# Patient Record
Sex: Female | Born: 1935 | Hispanic: No | Smoking: Never smoker
Health system: Southern US, Community
[De-identification: ages and names within clinical notes are randomized; demographics above are authoritative.]

## PROBLEM LIST (undated history)

## (undated) DIAGNOSIS — E538 Deficiency of other specified B group vitamins: Secondary | ICD-10-CM

## (undated) DIAGNOSIS — T7840XA Allergy, unspecified, initial encounter: Secondary | ICD-10-CM

## (undated) DIAGNOSIS — M1990 Unspecified osteoarthritis, unspecified site: Secondary | ICD-10-CM

## (undated) DIAGNOSIS — I509 Heart failure, unspecified: Secondary | ICD-10-CM

## (undated) DIAGNOSIS — D649 Anemia, unspecified: Secondary | ICD-10-CM

## (undated) DIAGNOSIS — E785 Hyperlipidemia, unspecified: Secondary | ICD-10-CM

## (undated) DIAGNOSIS — N183 Chronic kidney disease, stage 3 unspecified: Secondary | ICD-10-CM

## (undated) DIAGNOSIS — K219 Gastro-esophageal reflux disease without esophagitis: Secondary | ICD-10-CM

## (undated) DIAGNOSIS — H269 Unspecified cataract: Secondary | ICD-10-CM

## (undated) DIAGNOSIS — C4491 Basal cell carcinoma of skin, unspecified: Secondary | ICD-10-CM

## (undated) DIAGNOSIS — C801 Malignant (primary) neoplasm, unspecified: Secondary | ICD-10-CM

## (undated) DIAGNOSIS — N189 Chronic kidney disease, unspecified: Secondary | ICD-10-CM

## (undated) DIAGNOSIS — B019 Varicella without complication: Secondary | ICD-10-CM

## (undated) DIAGNOSIS — I10 Essential (primary) hypertension: Secondary | ICD-10-CM

## (undated) DIAGNOSIS — M810 Age-related osteoporosis without current pathological fracture: Secondary | ICD-10-CM

## (undated) DIAGNOSIS — K5790 Diverticulosis of intestine, part unspecified, without perforation or abscess without bleeding: Secondary | ICD-10-CM

## (undated) DIAGNOSIS — E039 Hypothyroidism, unspecified: Secondary | ICD-10-CM

## (undated) HISTORY — DX: Hyperlipidemia, unspecified: E78.5

## (undated) HISTORY — DX: Varicella without complication: B01.9

## (undated) HISTORY — PX: NEUROMA SURGERY: SHX722

## (undated) HISTORY — DX: Gastro-esophageal reflux disease without esophagitis: K21.9

## (undated) HISTORY — DX: Basal cell carcinoma of skin, unspecified: C44.91

## (undated) HISTORY — PX: CATARACT EXTRACTION: SUR2

## (undated) HISTORY — DX: Age-related osteoporosis without current pathological fracture: M81.0

## (undated) HISTORY — DX: Deficiency of other specified B group vitamins: E53.8

## (undated) HISTORY — PX: EYE SURGERY: SHX253

## (undated) HISTORY — PX: DILATION AND CURETTAGE OF UTERUS: SHX78

## (undated) HISTORY — PX: CARDIAC CATHETERIZATION: SHX172

## (undated) HISTORY — DX: Essential (primary) hypertension: I10

## (undated) HISTORY — DX: Unspecified cataract: H26.9

## (undated) HISTORY — DX: Heart failure, unspecified: I50.9

## (undated) HISTORY — PX: JOINT REPLACEMENT: SHX530

## (undated) HISTORY — PX: OTHER SURGICAL HISTORY: SHX169

## (undated) HISTORY — DX: Allergy, unspecified, initial encounter: T78.40XA

## (undated) HISTORY — PX: BUNIONECTOMY: SHX129

---

## 2004-11-20 ENCOUNTER — Ambulatory Visit: Payer: Self-pay | Admitting: Unknown Physician Specialty

## 2005-12-02 ENCOUNTER — Ambulatory Visit: Payer: Self-pay | Admitting: Unknown Physician Specialty

## 2006-09-21 ENCOUNTER — Ambulatory Visit: Payer: Self-pay | Admitting: Unknown Physician Specialty

## 2006-12-06 ENCOUNTER — Ambulatory Visit: Payer: Self-pay | Admitting: Unknown Physician Specialty

## 2007-12-15 ENCOUNTER — Ambulatory Visit: Payer: Self-pay | Admitting: Unknown Physician Specialty

## 2008-12-17 ENCOUNTER — Ambulatory Visit: Payer: Self-pay | Admitting: Unknown Physician Specialty

## 2009-12-23 ENCOUNTER — Ambulatory Visit: Payer: Self-pay | Admitting: Unknown Physician Specialty

## 2010-01-29 ENCOUNTER — Ambulatory Visit: Payer: Self-pay | Admitting: Orthopedic Surgery

## 2010-01-29 ENCOUNTER — Ambulatory Visit: Payer: Self-pay | Admitting: Cardiovascular Disease

## 2010-02-05 ENCOUNTER — Ambulatory Visit: Payer: Self-pay | Admitting: Orthopedic Surgery

## 2010-12-25 ENCOUNTER — Ambulatory Visit: Payer: Self-pay | Admitting: Unknown Physician Specialty

## 2012-01-18 ENCOUNTER — Ambulatory Visit: Payer: Self-pay | Admitting: Internal Medicine

## 2013-02-01 ENCOUNTER — Ambulatory Visit: Payer: Self-pay

## 2013-02-28 ENCOUNTER — Ambulatory Visit: Payer: Self-pay | Admitting: Orthopedic Surgery

## 2013-03-21 ENCOUNTER — Ambulatory Visit: Payer: Self-pay | Admitting: Orthopedic Surgery

## 2013-04-09 ENCOUNTER — Ambulatory Visit: Payer: Self-pay | Admitting: Orthopedic Surgery

## 2013-04-09 LAB — BASIC METABOLIC PANEL
Chloride: 103 mmol/L (ref 98–107)
Creatinine: 1.19 mg/dL (ref 0.60–1.30)
EGFR (African American): 49 — ABNORMAL LOW
Glucose: 92 mg/dL (ref 65–99)
Potassium: 4.1 mmol/L (ref 3.5–5.1)
Sodium: 137 mmol/L (ref 136–145)

## 2013-04-09 LAB — CBC
HCT: 39 % (ref 35.0–47.0)
HGB: 13.1 g/dL (ref 12.0–16.0)
MCH: 28.5 pg (ref 26.0–34.0)
RBC: 4.58 10*6/uL (ref 3.80–5.20)
WBC: 13 10*3/uL — ABNORMAL HIGH (ref 3.6–11.0)

## 2013-04-09 LAB — MRSA PCR SCREENING

## 2013-04-13 ENCOUNTER — Telehealth: Payer: Self-pay | Admitting: *Deleted

## 2013-04-13 NOTE — Telephone Encounter (Signed)
Refill Request  Metoprolol 50 mg  #90  Take 1 tablet daily   [facility]

## 2013-04-13 NOTE — Telephone Encounter (Signed)
Okay to fill? Pt has not been seen at this office

## 2013-04-13 NOTE — Telephone Encounter (Signed)
I have not seen this pt (ever) and she does not have an appt scheduled with me.  Need to send back to pharmacy - for them to send to her regular pcp

## 2013-04-24 ENCOUNTER — Inpatient Hospital Stay: Payer: Self-pay | Admitting: Orthopedic Surgery

## 2013-04-25 LAB — BASIC METABOLIC PANEL
Anion Gap: 5 — ABNORMAL LOW (ref 7–16)
BUN: 21 mg/dL — ABNORMAL HIGH (ref 7–18)
Co2: 27 mmol/L (ref 21–32)
Creatinine: 1.24 mg/dL (ref 0.60–1.30)
EGFR (Non-African Amer.): 40 — ABNORMAL LOW
Glucose: 87 mg/dL (ref 65–99)
Osmolality: 263 (ref 275–301)
Potassium: 3.9 mmol/L (ref 3.5–5.1)
Sodium: 130 mmol/L — ABNORMAL LOW (ref 136–145)

## 2013-04-25 LAB — HEMOGLOBIN: HGB: 11 g/dL — ABNORMAL LOW (ref 12.0–16.0)

## 2013-04-26 LAB — BASIC METABOLIC PANEL
Anion Gap: 5 — ABNORMAL LOW (ref 7–16)
Calcium, Total: 9.1 mg/dL (ref 8.5–10.1)
Chloride: 99 mmol/L (ref 98–107)
Co2: 29 mmol/L (ref 21–32)
Creatinine: 1.3 mg/dL (ref 0.60–1.30)
EGFR (African American): 44 — ABNORMAL LOW
EGFR (Non-African Amer.): 38 — ABNORMAL LOW
Sodium: 133 mmol/L — ABNORMAL LOW (ref 136–145)

## 2014-05-30 ENCOUNTER — Encounter: Payer: Self-pay | Admitting: Sports Medicine

## 2014-05-30 ENCOUNTER — Ambulatory Visit (INDEPENDENT_AMBULATORY_CARE_PROVIDER_SITE_OTHER): Payer: Medicare (Managed Care) | Admitting: Sports Medicine

## 2014-05-30 VITALS — BP 129/82 | HR 71 | Ht 60.0 in | Wt 189.0 lb

## 2014-05-30 DIAGNOSIS — M7120 Synovial cyst of popliteal space [Baker], unspecified knee: Secondary | ICD-10-CM

## 2014-05-30 DIAGNOSIS — IMO0002 Reserved for concepts with insufficient information to code with codable children: Secondary | ICD-10-CM

## 2014-05-30 DIAGNOSIS — M1712 Unilateral primary osteoarthritis, left knee: Secondary | ICD-10-CM

## 2014-05-30 DIAGNOSIS — M1710 Unilateral primary osteoarthritis, unspecified knee: Secondary | ICD-10-CM

## 2014-05-30 DIAGNOSIS — M7122 Synovial cyst of popliteal space [Baker], left knee: Secondary | ICD-10-CM

## 2014-05-30 MED ORDER — METHYLPREDNISOLONE ACETATE 40 MG/ML IJ SUSP
40.0000 mg | Freq: Once | INTRAMUSCULAR | Status: AC
  Administered 2014-05-30: 40 mg via INTRA_ARTICULAR

## 2014-05-30 NOTE — Patient Instructions (Signed)
It was nice to meet you today.  Please keep your knee wrapped with an ace wrap as we discussed.

## 2014-05-30 NOTE — Progress Notes (Signed)
  Olivia Lewis - 80 y.o. female MRN [id number]  Date of birth: 1929-07-25  SUBJECTIVE:  Including CC & ROS.  Baker's Cyst: Patient is followed by Dr. Rudene Christians and has been referred here for ultrasound guided aspiration of left-sided Baker's cyst.  She's had chronic bilateral knee problems for quite some time and underwent a right total knee replacement last year.  Since then she has noted her left knee has been bothering her more.  She has previously undergone injections with only mild to moderate  improvement  In a social he developed a Child psychotherapist cyst that seems to be causing the majority of her symptoms.    HISTORY: Past Medical, Surgical, Social, and Family History Reviewed & Updated per EMR. Pertinent Historical Findings include: History of heart disease and hypertension.  No diabetes.    right total knee replacement less than one year ago Nonsmoker  PHYSICAL EXAM:  VS: BP:129/82 mmHg  HR:71bpm  TEMP: ( )  RESP:   HT:5' (152.4 cm)   WT:189 lb (85.73 kg)  BMI:37 PHYSICAL EXAM: GENERAL:   elderly obese Caucasian  female. In no discomfort; no respiratory distress  PSYCH:  alert and appropriate, good insight  Left Knee Exam:        Gen/Palp:  Hypertrophic changes of the knee.  She has tenderness to palpation diffusely most notably over the bilateral joint lines and over her Baker's cyst that is easily palpable.                  ROM:  She has an approximately 5 loss of terminal extension                  Tests:  Positive patellar grind, Collateral ligaments are intact.   ASSESSMENT & PLAN: See problem based charting & AVS for pt instructions.  PROCEDURE NOTE : Left Knee US Guided Injection The risks, benefits and expected outcomes of the injection were reviewed and she wishes to undergo the above named procedure.  Written consent was obtained. After an appropriate time out was taken, the ultrasound was used to identify structures of the posterior popliteal region.  Large Baker cyst was  appreciated with no adjacent vascularity in the medial popliteal fossa.  The area was cleaned with alcohol and sterile ultrasound gel was used.  A skin wheal was obtained using 1 cc of 1% Xylocaine.  An 18-gauge syringe was inserted under all shown guidance and approximately 5 cc of straw-colored synovial fluid was withdrawn.  The Baker's cyst was completely collapsed at this point.  Syringes were exchanged and 1 cc of 40 mg Depo-Medrol was injected.  Ultrasound confirmed injection was placed into the Baker's cyst.  A Band-Aid was applied and the knee was wrapped with an Ace bandage. This procedure was well tolerated and there were no complications.

## 2014-05-30 NOTE — Assessment & Plan Note (Signed)
Chronic condition  - reactive Baker's Cyst to presumptive OA.  We discussed this is not a curative procedure and that we are hopeful it will improve her symptoms; she voices understanding of this. 1. US Guided Needle Aspiration and Injection provided today.   2. Continue compression >    Will return to Dr. Rudene Christians as scheduled

## 2015-02-25 DIAGNOSIS — E039 Hypothyroidism, unspecified: Secondary | ICD-10-CM | POA: Insufficient documentation

## 2015-02-25 DIAGNOSIS — N183 Chronic kidney disease, stage 3 unspecified: Secondary | ICD-10-CM | POA: Insufficient documentation

## 2015-02-25 DIAGNOSIS — N1831 Chronic kidney disease, stage 3a: Secondary | ICD-10-CM | POA: Insufficient documentation

## 2015-03-14 NOTE — Op Note (Signed)
PATIENT NAME:  Olivia Lewis, Olivia Lewis MR#:  [id number] DATE OF BIRTH:  1929-04-26  DATE OF PROCEDURE:  04/24/2013  PREOPERATIVE DIAGNOSIS: Right knee osteoarthritis.   POSTOPERATIVE DIAGNOSIS: Right knee osteoarthritis.   PROCEDURE: Right total knee replacement.   SURGEON: Laurene Footman, MD   ASSISTANT: Rachelle Hora, PA-C   ANESTHESIA: Spinal.   DESCRIPTION OF PROCEDURE: The patient was brought to the Operating Room, and after adequate anesthesia was obtained the right leg was prepped and draped in the usual sterile fashion. After prepping and draping in the usual sterile manner, appropriate patient identification and timeout procedures were completed. The leg was exsanguinated with an Esmarch and the tourniquet raised to 300 mmHg.   A midline skin incision was made with the knee in flexion followed by a medial parapatellar arthrotomy. Inspection of the knee revealed moderately advanced patellofemoral degenerative change, osteophytes medially with early degenerative change medially and a significant defect in the lateral femoral condyle as well as extensive erosion of the lateral tibial condyle. The ACL and fat pad were excised and proximal tibia exposed to allow for application of the Medacta cutting block. This was applied and the proximal tibia cut was carried out with removal of the proximal tibia cut along with the meniscus.  The knee had a valgus deformity, and the PCL was also excised to help correct the valgus deformity. The femur was then debrided to allow for application of the femoral cutting guide and the distal cuts made. The #4 cutting block was applied, and there was no notching with the anterior cut. Anterior, posterior and chamfer cuts were carried out. At this point, some residual bone off the tibia was removed and the 3 tibia trial was placed, pinned into place, proximal drill hole made and then the notch cut to hold it in place while trials were placed. The 4 femur was placed as well.  It was determined that a PS implant would be required, and with the trials a 20 mm trial gave excellent stability with full extension and flexion with good patellofemoral tracking. The distal femoral drill holes were made along with the notch cut for the trochlea at this time as well as the drill for removal of additional bone for the PS implant. The patella was then cut with the aid of a guide and drill guide applied. Three drill holes were made for the patellar implant, and a size 2 fit well, again with good tracking with no touch technique. At this point, all trial components were removed.  Exparel for local anesthetic was then infiltrated over multiple areas around the knee diluted with saline along with Marcaine and morphine in a similar fashion to aid in postoperative analgesia. The tourniquet was let down, and there was no excessive bleeding. The tourniquet was then again raised and the knee thoroughly irrigated and dried. The tibial component was cemented in place first, followed by the tibial insert, followed by the femoral component. The knee was held in extension as the patellar button was cemented and clamped in place. After the cement had set, excess cement was removed with use of a small osteotome and the knee again thoroughly irrigated. The arthrotomy was repaired with 0 Ethibond to help repair the medial retinaculum followed by a heavy quill suture, 2-0 quill subcutaneously, and skin staples. A wound VAC with Mepitel was then applied because of thickness of subcutaneous fat to try to prevent subcutaneous hematoma. A Polar Care device was then applied, and the patient was sent to  the recovery room in stable condition. The tourniquet was let down at the time of wound closure.   ESTIMATED BLOOD LOSS: 100 mL.  TOURNIQUET TIME:  A total of 80 minutes.  IMPLANTS: GMK primary total knee from [place], size 4N right PS femoral component, a size 3 right fixed standard tibial tray with a size 320 mm PS  tibial insert and a size 2 resurfacing patella. All components cemented.   CONDITION TO RECOVERY ROOM: Stable.   SPECIMEN: Cut ends of bone.   COMPLICATIONS: None.   ____________________________ Laurene Footman, MD mjm:cb D: 04/24/2013 19:28:54 ET T: 04/24/2013 21:16:24 ET JOB#: [id number]  cc: Laurene Footman, MD, <Dictator> Laurene Footman MD ELECTRONICALLY SIGNED 04/25/2013 12:51

## 2015-03-14 NOTE — Discharge Summary (Signed)
PATIENT NAME:  Olivia Lewis, Olivia Lewis MR#:  [id number] DATE OF BIRTH:  [birth year]  DATE OF ADMISSION:  04/24/2013 DATE OF DISCHARGE:  04/28/2013  ADMITTING DIAGNOSIS: Right knee osteoarthritis.   DISCHARGE DIAGNOSIS: Right knee osteoarthritis.   PROCEDURE: Right total knee replacement.    SURGEON: Laurene Footman, MD   ASSISTANT: Ronney Asters, PA-C   ANESTHESIA: Spinal.   ESTIMATED BLOOD LOSS: 100 mL.   TOURNIQUET TIME: A total of 80 minutes.   IMPLANTS: A GMK primary total knee from [place], size 4N right PS femoral component, a size 3 fixed standard tibial tray with a size 320 mm PS tibial insert and a size 2 resurfacing patella. All components cemented.   CONDITION: To recovery room stable.    HISTORY: The patient is an 81 year old female with a history of right knee osteoarthritis. She had exhausted nonoperative treatment and comes in after having my knee CT for preoperative planning of right total knee replacement. She reports her knee is hurting more and more and is starting to affect her left knee. She is having difficulty walking and even just doing normal activities of daily living around the house.   PHYSICAL EXAMINATION:  LUNGS: Clear.  HEART: Regular rate and rhythm.  NECK: Remarkable for limitation of flexion and extension of cervical spine.  EXTREMITIES: On exam, she has 5 to 10 degrees range of motion today with moderate swelling. She has slight valgus deformity which is passively correctable. Patellofemoral crepitation and again effusion. Distally, she has trace dorsalis pedis and posterior tibialis pulse. Flexion and extension of the toes is normal.   HOSPITAL COURSE: The patient was admitted to the hospital on 04/24/2013. She had surgery that same day and was brought to the orthopedic floor from the PACU unit in stable condition. On postoperative day 1, the patient was found to have a sodium level of 130 and hemoglobin slightly decreased at 11.0. The patient was  continued with normal saline, and on postoperative day 2, hemoglobin was stabilized, and sodium was 134. The patient was doing well. She was making good progress with therapy. By postoperative day 3, the patient had had significant improvement of her pain and continued to progress with therapy. On postoperative day 4, the patient had met goals for therapy to go home, and she had had a bowel movement and was ready for discharge to home with home health PT.   CONDITION AT DISCHARGE: Stable.   DISCHARGE INSTRUCTIONS:  1. The patient may gradually increase weight-bearing on the affected extremity.  2. She is to elevate the affected foot or leg on 1 or 2 pillows with the foot higher than the knee. 3. Thigh-high TED hose on both legs and remove at bedtime, replace when arising the next morning.  4. Elevate heels off the bed.  5. Use incentive spirometry every hour while awake and encourage cough and deep breathing. 6. She may resume a regular diet as tolerated.  7. Continue using Polar Care unit, maintaining a temperature between 40 and 50 degrees.  8. Do not get the dressing or bandage wet or dirty. Call [facility] if the dressing gets water under it. Leave the dressing on.  9. Call [facility] if any of the following occur: Bright red bleeding from the incision wound, fever of 101.5 degrees, redness, swelling or drainage at the incision. Call [facility] if you experience any increased leg pain, numbness or weakness in legs or bowel or bladder symptoms.  10. She was referred to home  physical therapy. She needs to call [place] if therapist has not contacted her within 48 hours of her return home.  11. She has followup appointment at [facility] in 2 weeks. She is to call for appointment.   DISCHARGE MEDICATIONS:  1. HCTZ 12.5 mg oral tablet 1 tablet orally once a day in the morning. 2. Metoprolol 50 mg tablet 1 tablet orally once a day in the morning.  3. Lipitor 10 mg tablet 1  tablet orally once a day at bedtime.  4. Aspirin 81 mg 1 tablet orally once a day at bedtime.  5. Glucosamine/chondroitin 400/500 mg 250 mg tablet 1 tablet orally once a day after meal at lunch.  6. Tylenol Extra Strength 500 mg tablet 2 tabs orally a day as needed.  7. Colace 100 mg oral capsule 1 cap orally once a day as needed.  8. Fish oil oral capsule 1 cap orally once a day. 9. Claritin 10 mg oral tablet 1 tablet orally once a day as needed.  10. Calcium carbonate/vitamin D 500 1 tablet orally 2 times a day. 11. Synthroid 50 mcg oral tablet 1 tablet orally once a day.  12. Vitamin B12 one tablet orally once a day.  13. Amlodipine/benazepril 5 mg/40 mg oral capsule 1 tab orally once a day in the morning. 14. Oxycodone 5 mg oral tablet 1 tablet orally every 4 hours as needed for pain. 15. Benazepril 40 mg oral tablet 1 tablet orally once a day.  16. Magnesium hydroxide 8% oral suspension 30 mL orally 2 times a day as needed for constipation. 17. Xarelto 1 tablet orally once a day in the morning for 8 days.   ____________________________ T. Rachelle Hora, PA-C tcg:OSi D: 04/27/2013 07:27:04 ET T: 04/27/2013 07:56:40 ET JOB#: [id number]  cc: T. Rachelle Hora, PA-C, <Dictator> Duanne Guess [place] ELECTRONICALLY SIGNED 04/29/2013 2:35

## 2015-03-26 ENCOUNTER — Ambulatory Visit: Payer: Medicare (Managed Care)

## 2015-03-26 ENCOUNTER — Other Ambulatory Visit: Payer: Self-pay | Admitting: Orthopedic Surgery

## 2015-03-26 DIAGNOSIS — M1712 Unilateral primary osteoarthritis, left knee: Secondary | ICD-10-CM

## 2015-03-27 ENCOUNTER — Ambulatory Visit
Admission: RE | Admit: 2015-03-27 | Discharge: 2015-03-27 | Disposition: A | Discharge status: Home / Self Care | Payer: Medicare Other | Source: Ambulatory Visit | Attending: Orthopedic Surgery | Admitting: Orthopedic Surgery

## 2015-03-27 DIAGNOSIS — M1712 Unilateral primary osteoarthritis, left knee: Secondary | ICD-10-CM | POA: Diagnosis not present

## 2015-03-27 DIAGNOSIS — I708 Atherosclerosis of other arteries: Secondary | ICD-10-CM | POA: Diagnosis not present

## 2015-04-24 ENCOUNTER — Encounter
Admission: RE | Admit: 2015-04-24 | Discharge: 2015-04-24 | Disposition: A | Discharge status: Home / Self Care | Payer: Medicare Other | Source: Ambulatory Visit | Attending: Orthopedic Surgery | Admitting: Orthopedic Surgery

## 2015-04-24 DIAGNOSIS — M179 Osteoarthritis of knee, unspecified: Secondary | ICD-10-CM | POA: Diagnosis not present

## 2015-04-24 HISTORY — DX: Hypothyroidism, unspecified: E03.9

## 2015-04-24 HISTORY — DX: Diverticulosis of intestine, part unspecified, without perforation or abscess without bleeding: K57.90

## 2015-04-24 HISTORY — DX: Unspecified osteoarthritis, unspecified site: M19.90

## 2015-04-24 HISTORY — DX: Anemia, unspecified: D64.9

## 2015-04-24 HISTORY — DX: Chronic kidney disease, unspecified: N18.9

## 2015-04-24 HISTORY — DX: Malignant (primary) neoplasm, unspecified: C80.1

## 2015-04-24 LAB — URINALYSIS COMPLETE WITH MICROSCOPIC (ARMC ONLY)
BILIRUBIN URINE: NEGATIVE
Bacteria, UA: NONE SEEN
Glucose, UA: NEGATIVE mg/dL
HGB URINE DIPSTICK: NEGATIVE
KETONES UR: NEGATIVE mg/dL
NITRITE: NEGATIVE
PROTEIN: NEGATIVE mg/dL
Specific Gravity, Urine: 1.012 (ref 1.005–1.030)
pH: 6 (ref 5.0–8.0)

## 2015-04-24 LAB — BASIC METABOLIC PANEL
Anion gap: 10 (ref 5–15)
BUN: 31 mg/dL — ABNORMAL HIGH (ref 6–20)
CHLORIDE: 100 mmol/L — AB (ref 101–111)
CO2: 29 mmol/L (ref 22–32)
Calcium: 10.1 mg/dL (ref 8.9–10.3)
Creatinine, Ser: 1.54 mg/dL — ABNORMAL HIGH (ref 0.44–1.00)
GFR calc Af Amer: 35 mL/min — ABNORMAL LOW (ref >60)
GFR calc non Af Amer: 30 mL/min — ABNORMAL LOW (ref >60)
Glucose, Bld: 115 mg/dL — ABNORMAL HIGH (ref 65–99)
Potassium: 4.1 mmol/L (ref 3.5–5.1)
Sodium: 139 mmol/L (ref 135–145)

## 2015-04-24 LAB — SEDIMENTATION RATE: Sed Rate: 6 mm/hr (ref 0–30)

## 2015-04-24 LAB — CBC
HCT: 41.5 % (ref 35.0–47.0)
Hemoglobin: 14 g/dL (ref 12.0–16.0)
MCH: 29 pg (ref 26.0–34.0)
MCHC: 33.7 g/dL (ref 32.0–36.0)
MCV: 86.2 fL (ref 80.0–100.0)
Platelets: 282 10*3/uL (ref 150–440)
RBC: 4.81 MIL/uL (ref 3.80–5.20)
RDW: 13.8 % (ref 11.5–14.5)
WBC: 11.9 10*3/uL — AB (ref 3.6–11.0)

## 2015-04-24 LAB — SURGICAL PCR SCREEN
MRSA, PCR: NEGATIVE
STAPHYLOCOCCUS AUREUS: POSITIVE — AB

## 2015-04-24 LAB — PROTIME-INR
INR: 0.92
Prothrombin Time: 12.6 seconds (ref 11.4–15.0)

## 2015-04-24 LAB — APTT: aPTT: 30 seconds (ref 24–36)

## 2015-04-24 LAB — TYPE AND SCREEN
ABO/RH(D): A NEG
Antibody Screen: NEGATIVE

## 2015-04-24 LAB — ABO/RH: ABO/RH(D): A NEG

## 2015-04-24 NOTE — Patient Instructions (Signed)
  Your procedure is scheduled on: May 06, 2015 (Tuesday) Report to Same  Day Surgery. To find out your arrival time please call [phone] between 1PM - 3PM on May 05, 2015 (Monday).  Remember: Instructions that are not followed completely may result in serious medical risk, up to and including death, or upon the discretion of your surgeon and anesthesiologist your surgery may need to be rescheduled.    __x__ 1. Do not eat food or drink liquids after midnight. No gum chewing or hard candies.     ____ 2. No Alcohol for 24 hours before or after surgery.   ____ 3. Bring all medications with you on the day of surgery if instructed.    __x__ 4. Notify your doctor if there is any change in your medical condition     (cold, fever, infections).     Do not wear jewelry, make-up, hairpins, clips or nail polish.  Do not wear lotions, powders, or perfumes. You may wear deodorant.  Do not shave 48 hours prior to surgery. Men may shave face and neck.  Do not bring valuables to the hospital.    [facility] is not responsible for any belongings or valuables.               Contacts, dentures or bridgework may not be worn into surgery.  Leave your suitcase in the car. After surgery it may be brought to your room.  For patients admitted to the hospital, discharge time is determined by your                treatment team.   Patients discharged the day of surgery will not be allowed to drive home.   Please read over the following fact sheets that you were given:   MRSA Information and Surgical Site Infection Prevention   ____ Take these medicines the morning of surgery with A SIP OF WATER:    1. Metoprolol  2. Amlodipine/Benazepril  3.   4.  5.  6.  ____ Fleet Enema (as directed)   __x__ Use CHG Soap as directed  ____ Use inhalers on the day of surgery  ____ Stop metformin 2 days prior to surgery    ____ Take 1/2 of usual insulin dose the night before surgery and none on the morning of  surgery.   __x__ Stop Coumadin/Plavix/aspirin on one week before surgery  ____ Stop Anti-inflammatories on    x  Stop supplements until after surgery. (Fish Oil, Vitamin B-12, Osteo B- Flex now)   ____ Bring C-Pap to the hospital.

## 2015-04-25 NOTE — OR Nursing (Signed)
[facility] and faxed Dr Theodore Demark office with request for medical clearance.

## 2015-04-25 NOTE — OR Nursing (Signed)
  [facility] called and faxed regarding positive Staphylococcus aureus result.

## 2015-04-29 NOTE — OR Nursing (Signed)
Olivia Lewis ([facility],ortho) notified of positive staph aureus results, and stated Dr. Rudene Christians aware and is not going to treat per [place].

## 2015-05-01 NOTE — OR Nursing (Signed)
Cleared by Dr Ola Spurr low risk 04/30/15

## 2015-05-06 ENCOUNTER — Inpatient Hospital Stay: Payer: Medicare Other

## 2015-05-06 ENCOUNTER — Encounter: Payer: Self-pay | Admitting: *Deleted

## 2015-05-06 ENCOUNTER — Inpatient Hospital Stay
Admission: RE | Admit: 2015-05-06 | Discharge: 2015-05-09 | DRG: 470 | Disposition: A | Discharge status: Home / Self Care | Payer: Medicare Other | Source: Ambulatory Visit | Attending: Orthopedic Surgery | Admitting: Orthopedic Surgery

## 2015-05-06 ENCOUNTER — Encounter
Admission: RE | Disposition: A | Discharge status: Home / Self Care | Payer: Self-pay | Source: Ambulatory Visit | Attending: Orthopedic Surgery

## 2015-05-06 ENCOUNTER — Inpatient Hospital Stay: Payer: Medicare Other | Admitting: Certified Registered"

## 2015-05-06 DIAGNOSIS — F419 Anxiety disorder, unspecified: Secondary | ICD-10-CM | POA: Diagnosis not present

## 2015-05-06 DIAGNOSIS — E039 Hypothyroidism, unspecified: Secondary | ICD-10-CM | POA: Diagnosis present

## 2015-05-06 DIAGNOSIS — I129 Hypertensive chronic kidney disease with stage 1 through stage 4 chronic kidney disease, or unspecified chronic kidney disease: Secondary | ICD-10-CM | POA: Diagnosis present

## 2015-05-06 DIAGNOSIS — Z6838 Body mass index (BMI) 38.0-38.9, adult: Secondary | ICD-10-CM

## 2015-05-06 DIAGNOSIS — E785 Hyperlipidemia, unspecified: Secondary | ICD-10-CM | POA: Diagnosis present

## 2015-05-06 DIAGNOSIS — Z85828 Personal history of other malignant neoplasm of skin: Secondary | ICD-10-CM

## 2015-05-06 DIAGNOSIS — G8918 Other acute postprocedural pain: Secondary | ICD-10-CM

## 2015-05-06 DIAGNOSIS — N189 Chronic kidney disease, unspecified: Secondary | ICD-10-CM | POA: Diagnosis present

## 2015-05-06 DIAGNOSIS — M1712 Unilateral primary osteoarthritis, left knee: Secondary | ICD-10-CM | POA: Diagnosis present

## 2015-05-06 DIAGNOSIS — M1710 Unilateral primary osteoarthritis, unspecified knee: Secondary | ICD-10-CM | POA: Diagnosis present

## 2015-05-06 HISTORY — PX: TOTAL KNEE ARTHROPLASTY: SHX125

## 2015-05-06 LAB — CBC
HEMATOCRIT: 36.5 % (ref 35.0–47.0)
Hemoglobin: 12.1 g/dL (ref 12.0–16.0)
MCH: 28.8 pg (ref 26.0–34.0)
MCHC: 33 g/dL (ref 32.0–36.0)
MCV: 87.2 fL (ref 80.0–100.0)
Platelets: 242 10*3/uL (ref 150–440)
RBC: 4.19 MIL/uL (ref 3.80–5.20)
RDW: 14 % (ref 11.5–14.5)
WBC: 8.8 10*3/uL (ref 3.6–11.0)

## 2015-05-06 LAB — CREATININE, SERUM
Creatinine, Ser: 1.27 mg/dL — ABNORMAL HIGH (ref 0.44–1.00)
GFR calc non Af Amer: 38 mL/min — ABNORMAL LOW (ref >60)
GFR, EST AFRICAN AMERICAN: 44 mL/min — AB (ref >60)

## 2015-05-06 SURGERY — ARTHROPLASTY, KNEE, TOTAL
Anesthesia: Spinal | Site: Knee | Laterality: Left | Wound class: Clean

## 2015-05-06 MED ORDER — NEOMYCIN-POLYMYXIN B GU 40-200000 IR SOLN
Status: DC | PRN
  Administered 2015-05-06: 16 mL

## 2015-05-06 MED ORDER — PROPOFOL INFUSION 10 MG/ML OPTIME
INTRAVENOUS | Status: DC | PRN
  Administered 2015-05-06: 12:00:00 via INTRAVENOUS
  Administered 2015-05-06: 25 ug/kg/min via INTRAVENOUS

## 2015-05-06 MED ORDER — CLINDAMYCIN PHOSPHATE 900 MG/50ML IV SOLN
900.0000 mg | Freq: Once | INTRAVENOUS | Status: AC
  Administered 2015-05-06: 900 mg via INTRAVENOUS

## 2015-05-06 MED ORDER — HYDROCHLOROTHIAZIDE 25 MG PO TABS: 12.5000 mg | ORAL_TABLET | ORAL | Status: DC

## 2015-05-06 MED ORDER — ONDANSETRON HCL 4 MG/2ML IJ SOLN: 4.0000 mg | Freq: Four times a day (QID) | INTRAMUSCULAR | Status: DC | PRN

## 2015-05-06 MED ORDER — B-12 100-5000 MCG SL SUBL: 1.0000 | SUBLINGUAL_TABLET | Freq: Every day | SUBLINGUAL | Status: DC

## 2015-05-06 MED ORDER — LACTATED RINGERS IV SOLN
INTRAVENOUS | Status: DC
  Administered 2015-05-06 (×2): via INTRAVENOUS

## 2015-05-06 MED ORDER — HYDROCHLOROTHIAZIDE 12.5 MG PO CAPS
12.5000 mg | ORAL_CAPSULE | Freq: Every day | ORAL | Status: DC
  Administered 2015-05-07 – 2015-05-09 (×3): 12.5 mg via ORAL
  Filled 2015-05-06 (×3): qty 1

## 2015-05-06 MED ORDER — LEVOTHYROXINE SODIUM 50 MCG PO TABS
50.0000 ug | ORAL_TABLET | Freq: Every day | ORAL | Status: DC
  Administered 2015-05-06 – 2015-05-09 (×4): 50 ug via ORAL
  Filled 2015-05-06 (×6): qty 1

## 2015-05-06 MED ORDER — LIDOCAINE HCL (PF) 1 % IJ SOLN
INTRAMUSCULAR | Status: AC
  Filled 2015-05-06: qty 2

## 2015-05-06 MED ORDER — TRANEXAMIC ACID 1000 MG/10ML IV SOLN
1000.0000 mg | INTRAVENOUS | Status: AC
  Administered 2015-05-06: 1000 mg via INTRAVENOUS
  Filled 2015-05-06: qty 10

## 2015-05-06 MED ORDER — LORATADINE 10 MG PO TABS: 10.0000 mg | ORAL_TABLET | Freq: Every day | ORAL | Status: DC | PRN

## 2015-05-06 MED ORDER — SM FISH OIL 1000 MG PO CAPS: 1.0000 | ORAL_CAPSULE | Freq: Every day | ORAL | Status: DC

## 2015-05-06 MED ORDER — METHOCARBAMOL 500 MG PO TABS: 500.0000 mg | ORAL_TABLET | Freq: Four times a day (QID) | ORAL | Status: DC | PRN

## 2015-05-06 MED ORDER — BUPIVACAINE-EPINEPHRINE (PF) 0.25% -1:200000 IJ SOLN
INTRAMUSCULAR | Status: AC
  Filled 2015-05-06: qty 30

## 2015-05-06 MED ORDER — BUPIVACAINE LIPOSOME 1.3 % IJ SUSP
INTRAMUSCULAR | Status: AC
  Filled 2015-05-06: qty 20

## 2015-05-06 MED ORDER — FENTANYL CITRATE (PF) 100 MCG/2ML IJ SOLN: 25.0000 ug | INTRAMUSCULAR | Status: DC | PRN

## 2015-05-06 MED ORDER — ATORVASTATIN CALCIUM 10 MG PO TABS
10.0000 mg | ORAL_TABLET | ORAL | Status: DC
  Administered 2015-05-06 – 2015-05-08 (×3): 10 mg via ORAL
  Filled 2015-05-06 (×3): qty 1

## 2015-05-06 MED ORDER — SODIUM CHLORIDE 0.9 % IV SOLN
INTRAVENOUS | Status: DC
Start: 2015-05-06 — End: 2015-05-09
  Administered 2015-05-06 – 2015-05-07 (×3): via INTRAVENOUS

## 2015-05-06 MED ORDER — BISACODYL 10 MG RE SUPP
10.0000 mg | Freq: Every day | RECTAL | Status: DC | PRN
  Administered 2015-05-08: 10 mg via RECTAL
  Filled 2015-05-06: qty 1

## 2015-05-06 MED ORDER — AMLODIPINE BESYLATE 5 MG PO TABS
5.0000 mg | ORAL_TABLET | Freq: Every day | ORAL | Status: DC
  Administered 2015-05-07 – 2015-05-09 (×3): 5 mg via ORAL
  Filled 2015-05-06 (×3): qty 1

## 2015-05-06 MED ORDER — ALUM & MAG HYDROXIDE-SIMETH 200-200-20 MG/5ML PO SUSP: 30.0000 mL | ORAL | Status: DC | PRN

## 2015-05-06 MED ORDER — CLINDAMYCIN PHOSPHATE 900 MG/50ML IV SOLN
INTRAVENOUS | Status: AC
  Filled 2015-05-06: qty 50

## 2015-05-06 MED ORDER — PHENOL 1.4 % MT LIQD
1.0000 | OROMUCOSAL | Status: DC | PRN
  Filled 2015-05-06: qty 177

## 2015-05-06 MED ORDER — BUPIVACAINE LIPOSOME 1.3 % IJ SUSP
INTRAMUSCULAR | Status: DC | PRN
  Administered 2015-05-06: 50 mL

## 2015-05-06 MED ORDER — CALCIUM CARBONATE-VITAMIN D 500-200 MG-UNIT PO TABS
1.0000 | ORAL_TABLET | Freq: Every day | ORAL | Status: DC
  Administered 2015-05-07 – 2015-05-09 (×3): 1 via ORAL
  Filled 2015-05-06 (×3): qty 1

## 2015-05-06 MED ORDER — FAMOTIDINE 20 MG PO TABS
20.0000 mg | ORAL_TABLET | Freq: Once | ORAL | Status: AC
  Administered 2015-05-06: 20 mg via ORAL

## 2015-05-06 MED ORDER — MORPHINE (PF) INJECTION FOR INHALATION 10 MG/ML
RESPIRATORY_TRACT | Status: DC | PRN
  Administered 2015-05-06: 10 mg via RESPIRATORY_TRACT

## 2015-05-06 MED ORDER — CLINDAMYCIN PHOSPHATE 900 MG/50ML IV SOLN
900.0000 mg | Freq: Four times a day (QID) | INTRAVENOUS | Status: AC
  Administered 2015-05-06 – 2015-05-07 (×3): 900 mg via INTRAVENOUS
  Filled 2015-05-06 (×3): qty 50

## 2015-05-06 MED ORDER — AMLODIPINE BESY-BENAZEPRIL HCL 5-40 MG PO CAPS: 1.0000 | ORAL_CAPSULE | Freq: Every day | ORAL | Status: DC

## 2015-05-06 MED ORDER — ACETAMINOPHEN 325 MG PO TABS
650.0000 mg | ORAL_TABLET | Freq: Four times a day (QID) | ORAL | Status: DC | PRN
  Administered 2015-05-08 – 2015-05-09 (×2): 650 mg via ORAL
  Filled 2015-05-06 (×2): qty 2

## 2015-05-06 MED ORDER — DIPHENHYDRAMINE HCL 12.5 MG/5ML PO ELIX: 12.5000 mg | ORAL_SOLUTION | ORAL | Status: DC | PRN

## 2015-05-06 MED ORDER — BUPIVACAINE-EPINEPHRINE (PF) 0.25% -1:200000 IJ SOLN
INTRAMUSCULAR | Status: DC | PRN
  Administered 2015-05-06: 30 mL via PERINEURAL

## 2015-05-06 MED ORDER — OXYCODONE HCL 5 MG PO TABS
5.0000 mg | ORAL_TABLET | ORAL | Status: DC | PRN
  Administered 2015-05-06 – 2015-05-08 (×5): 5 mg via ORAL
  Filled 2015-05-06 (×5): qty 1

## 2015-05-06 MED ORDER — BENAZEPRIL HCL 20 MG PO TABS
40.0000 mg | ORAL_TABLET | Freq: Every day | ORAL | Status: DC
  Administered 2015-05-07 – 2015-05-09 (×3): 40 mg via ORAL
  Filled 2015-05-06 (×3): qty 2

## 2015-05-06 MED ORDER — SODIUM CHLORIDE 0.9 % IJ SOLN
INTRAMUSCULAR | Status: AC
  Filled 2015-05-06: qty 50

## 2015-05-06 MED ORDER — MORPHINE SULFATE 2 MG/ML IJ SOLN
2.0000 mg | INTRAMUSCULAR | Status: DC | PRN
  Administered 2015-05-06: 2 mg via INTRAVENOUS
  Filled 2015-05-06: qty 1

## 2015-05-06 MED ORDER — LIDOCAINE HCL (PF) 2 % IJ SOLN
INTRAMUSCULAR | Status: DC | PRN
  Administered 2015-05-06: 50 mg

## 2015-05-06 MED ORDER — CALCIUM CARB-CHOLECALCIFEROL 500-200 MG-UNIT PO TABS: 1.0000 | ORAL_TABLET | Freq: Every day | ORAL | Status: DC

## 2015-05-06 MED ORDER — METHOCARBAMOL 1000 MG/10ML IJ SOLN
500.0000 mg | Freq: Four times a day (QID) | INTRAVENOUS | Status: DC | PRN
  Filled 2015-05-06: qty 5

## 2015-05-06 MED ORDER — MENTHOL 3 MG MT LOZG
1.0000 | LOZENGE | OROMUCOSAL | Status: DC | PRN
  Filled 2015-05-06: qty 9

## 2015-05-06 MED ORDER — ONDANSETRON HCL 4 MG PO TABS: 4.0000 mg | ORAL_TABLET | Freq: Four times a day (QID) | ORAL | Status: DC | PRN

## 2015-05-06 MED ORDER — NEOMYCIN-POLYMYXIN B GU 40-200000 IR SOLN
Status: AC
  Filled 2015-05-06: qty 20

## 2015-05-06 MED ORDER — EPHEDRINE SULFATE 50 MG/ML IJ SOLN
INTRAMUSCULAR | Status: DC | PRN
  Administered 2015-05-06 (×2): 5 mg via INTRAVENOUS

## 2015-05-06 MED ORDER — METOPROLOL SUCCINATE ER 50 MG PO TB24
50.0000 mg | ORAL_TABLET | ORAL | Status: DC
  Administered 2015-05-07 – 2015-05-08 (×2): 50 mg via ORAL
  Filled 2015-05-06 (×2): qty 1

## 2015-05-06 MED ORDER — MAGNESIUM CITRATE PO SOLN
1.0000 | Freq: Once | ORAL | Status: AC | PRN
  Filled 2015-05-06: qty 296

## 2015-05-06 MED ORDER — ASPIRIN EC 81 MG PO TBEC
81.0000 mg | DELAYED_RELEASE_TABLET | Freq: Every day | ORAL | Status: DC
  Administered 2015-05-07 – 2015-05-09 (×3): 81 mg via ORAL
  Filled 2015-05-06 (×3): qty 1

## 2015-05-06 MED ORDER — DOCUSATE SODIUM 100 MG PO CAPS
100.0000 mg | ORAL_CAPSULE | Freq: Two times a day (BID) | ORAL | Status: DC
  Administered 2015-05-07 – 2015-05-09 (×5): 100 mg via ORAL
  Filled 2015-05-06 (×5): qty 1

## 2015-05-06 MED ORDER — OMEGA-3-ACID ETHYL ESTERS 1 G PO CAPS
1.0000 g | ORAL_CAPSULE | Freq: Every day | ORAL | Status: DC
  Administered 2015-05-07 – 2015-05-09 (×3): 1 g via ORAL
  Filled 2015-05-06 (×3): qty 1

## 2015-05-06 MED ORDER — ACETAMINOPHEN 650 MG RE SUPP: 650.0000 mg | Freq: Four times a day (QID) | RECTAL | Status: DC | PRN

## 2015-05-06 MED ORDER — ONDANSETRON HCL 4 MG/2ML IJ SOLN: 4.0000 mg | Freq: Once | INTRAMUSCULAR | Status: DC | PRN

## 2015-05-06 MED ORDER — ENOXAPARIN SODIUM 30 MG/0.3ML ~~LOC~~ SOLN: 30.0000 mg | SUBCUTANEOUS | Status: DC

## 2015-05-06 MED ORDER — FAMOTIDINE 20 MG PO TABS
ORAL_TABLET | ORAL | Status: AC
  Filled 2015-05-06: qty 1

## 2015-05-06 MED ORDER — ZOLPIDEM TARTRATE 5 MG PO TABS: 5.0000 mg | ORAL_TABLET | Freq: Every evening | ORAL | Status: DC | PRN

## 2015-05-06 MED ORDER — ENOXAPARIN SODIUM 30 MG/0.3ML ~~LOC~~ SOLN
30.0000 mg | Freq: Two times a day (BID) | SUBCUTANEOUS | Status: DC
  Administered 2015-05-07 – 2015-05-09 (×5): 30 mg via SUBCUTANEOUS
  Filled 2015-05-06 (×5): qty 0.3

## 2015-05-06 MED ORDER — PROPOFOL 10 MG/ML IV BOLUS
INTRAVENOUS | Status: DC | PRN
  Administered 2015-05-06 (×2): 20 mg via INTRAVENOUS

## 2015-05-06 MED ORDER — MAGNESIUM HYDROXIDE 400 MG/5ML PO SUSP
30.0000 mL | Freq: Every day | ORAL | Status: DC | PRN
  Administered 2015-05-07: 30 mL via ORAL
  Filled 2015-05-06: qty 30

## 2015-05-06 MED ORDER — BUPIVACAINE HCL (PF) 0.5 % IJ SOLN
INTRAMUSCULAR | Status: DC | PRN
  Administered 2015-05-06: 3 mL via INTRATHECAL

## 2015-05-06 SURGICAL SUPPLY — 54 items
BANDAGE ELASTIC 6 CLIP ST LF (GAUZE/BANDAGES/DRESSINGS) ×3 IMPLANT
BLADE SAW 1 (BLADE) ×3 IMPLANT
BLOCK CUTTING FEMUR 4 LT MED (MISCELLANEOUS) ×3 IMPLANT
BLOCK CUTTING TIBIAL 4 LT CT (MISCELLANEOUS) ×3 IMPLANT
CANISTER SUCT 1200ML W/VALVE (MISCELLANEOUS) ×3 IMPLANT
CANISTER SUCT 3000ML (MISCELLANEOUS) ×6 IMPLANT
CAPT KNEE TOTAL 3 ×3 IMPLANT
CATH FOL LEG HOLDER (MISCELLANEOUS) ×3 IMPLANT
CATH TRAY 16F METER LATEX (MISCELLANEOUS) ×3 IMPLANT
CEMENT HV SMART SET (Cement) ×6 IMPLANT
CHLORAPREP W/TINT 26ML (MISCELLANEOUS) ×6 IMPLANT
COOLER POLAR GLACIER W/PUMP (MISCELLANEOUS) ×3 IMPLANT
DRAPE INCISE IOBAN 66X45 STRL (DRAPES) ×6 IMPLANT
DRAPE SHEET LG 3/4 BI-LAMINATE (DRAPES) ×6 IMPLANT
ELECT CAUTERY BLADE 6.4 (BLADE) ×3 IMPLANT
GAUZE PETRO XEROFOAM 1X8 (MISCELLANEOUS) ×3 IMPLANT
GAUZE SPONGE 4X4 12PLY STRL (GAUZE/BANDAGES/DRESSINGS) ×3 IMPLANT
GLOVE BIOGEL PI IND STRL 9 (GLOVE) ×1 IMPLANT
GLOVE BIOGEL PI INDICATOR 9 (GLOVE) ×2
GLOVE SURG ORTHO 9.0 STRL STRW (GLOVE) ×3 IMPLANT
GOWN SPECIALTY ULTRA XL (MISCELLANEOUS) ×3 IMPLANT
GOWN STRL REUS W/ TWL LRG LVL3 (GOWN DISPOSABLE) ×2 IMPLANT
GOWN STRL REUS W/TWL LRG LVL3 (GOWN DISPOSABLE) ×4
HANDPIECE SUCTION TUBG SURGILV (MISCELLANEOUS) ×3 IMPLANT
HOOD PEEL AWAY FACE SHEILD DIS (HOOD) ×6 IMPLANT
IMMBOLIZER KNEE 19 BLUE UNIV (SOFTGOODS) ×3 IMPLANT
IV SET EXTENSION 6 LL TADAPT (SET/KITS/TRAYS/PACK) ×3 IMPLANT
KNEE MEDACTA TIBIAL/FEMORAL BL (Knees) ×3 IMPLANT
KNIFE SCULPS 14X20 (INSTRUMENTS) ×3 IMPLANT
NDL SAFETY 18GX1.5 (NEEDLE) ×3 IMPLANT
NEEDLE SPNL 18GX3.5 QUINCKE PK (NEEDLE) ×3 IMPLANT
NEEDLE SPNL 20GX3.5 QUINCKE YW (NEEDLE) ×3 IMPLANT
NS IRRIG 1000ML POUR BTL (IV SOLUTION) ×3 IMPLANT
PACK TOTAL KNEE (MISCELLANEOUS) ×3 IMPLANT
PAD GROUND ADULT SPLIT (MISCELLANEOUS) ×3 IMPLANT
PAD WRAPON POLAR KNEE (MISCELLANEOUS) ×1 IMPLANT
SOL .9 NS 3000ML IRR  AL (IV SOLUTION) ×2
SOL .9 NS 3000ML IRR UROMATIC (IV SOLUTION) ×1 IMPLANT
STAPLER SKIN PROX 35W (STAPLE) ×3 IMPLANT
STRAP SAFETY BODY (MISCELLANEOUS) ×3 IMPLANT
SUCTION FRAZIER TIP 10 FR DISP (SUCTIONS) ×3 IMPLANT
SUT DVC 2 QUILL PDO  T11 36X36 (SUTURE) ×2
SUT DVC 2 QUILL PDO T11 36X36 (SUTURE) ×1 IMPLANT
SUT DVC QUILL MONODERM 30X30 (SUTURE) ×3 IMPLANT
SUT ETHIBOND NAB CT1 #1 30IN (SUTURE) ×3 IMPLANT
SYR 20CC LL (SYRINGE) ×3 IMPLANT
SYR 50ML LL SCALE MARK (SYRINGE) ×3 IMPLANT
TOWER CARTRIDGE SMART MIX (DISPOSABLE) ×3 IMPLANT
WATER STERILE IRR 1000ML POUR (IV SOLUTION) ×3 IMPLANT
WRAPON POLAR PAD KNEE (MISCELLANEOUS) ×3
femoral component ×3 IMPLANT
patella ×3 IMPLANT
tibial insert fixed ×3 IMPLANT
tibial tray fixed ×3 IMPLANT

## 2015-05-06 NOTE — Anesthesia Postprocedure Evaluation (Signed)
  Anesthesia Post-op Note  Patient: Olivia Lewis  Procedure(s) Performed: Procedure(s): TOTAL KNEE ARTHROPLASTY (Left)  Anesthesia type:Spinal  Patient location: PACU  Post pain: Pain level controlled  Post assessment: Post-op Vital signs reviewed, Patient's Cardiovascular Status Stable, Respiratory Function Stable, Patent Airway and No signs of Nausea or vomiting  Post vital signs: Reviewed and stable  Last Vitals:  Filed Vitals:   05/06/15 1233  BP:   Pulse:   Temp: 36.4 C  Resp:     Level of consciousness: awake, alert  and patient cooperative  Complications: No apparent anesthesia complications

## 2015-05-06 NOTE — Op Note (Signed)
05/06/2015  12:13 PM  PATIENT:  Olivia Lewis  81 y.o. female  PRE-OPERATIVE DIAGNOSIS:  OSTEOARTHRITIS  POST-OPERATIVE DIAGNOSIS:  OSTEOARTHRITIS  PROCEDURE:  Procedure(s): TOTAL KNEE ARTHROPLASTY (Left)  SURGEON: Laurene Footman, MD  ASSISTANTS: Rachelle Hora [facility]  ANESTHESIA:   spinal  EBL:  Total I/O In: 1000 [I.V.:1000] Out: 200 [Urine:150; Blood:50]  BLOOD ADMINISTERED:none  DRAINS: none   LOCAL MEDICATIONS USED:  MARCAINE    and OTHER morphine and Exparel  SPECIMEN:  Source of Specimen:  Cut ends of bone  DISPOSITION OF SPECIMEN:  PATHOLOGY  COUNTS:  YES  TOURNIQUET:   73 minutes at 300 mmHg   IMPLANTSMedacta GMK sphere 4 left femur, 4 left tibia, 13 mm insert and to patella all components cemented  DICTATION: .Dragon Dictation  patient brought the operating room and after adequate anesthesia was obtained the right leg was prepped and draped in usual sterile fashion was turned by the upper thigh. After patient identification and timeout procedure were completed, tourniquet was elevated to 300 mmHg a midline skin incision was made. A medial approach was carried out and the knee exposed. There was sclerosis of the medial femoral condyle with significant bone loss on femoral condyle .The anterior cruciate ligament and PCL were released and anterior horns of the menisci resected. Approximately cutting block was applied and a proximal tibia cut was carried out using the Gideon cutting block my knee system. Femoral cut made in a similar fashion. the trochlear groove cut was made as well as drill holes for the distal femur with the trial then placed with tibial trial inserted proximal drill hole made for short stem and keel punch. 10 mm insert gave excellent stability. The patella was cut using the patellar cutting guide and measured to a size 2 after drilling 3 drill holes were made. the knee was infiltrated with the local anesthesia with eXPAREL injected  separately After the bony surfaces were thoroughly irrigated and dried all components cemented in place with the tibial insert placed with set screw. Excess cement removed the knee was thoroughly irrigated and there is excellent tracking with good stability. Full Extension and flexion 130 obtained. The arthrotomy was repaired using a heavy Quill to a Quill substantially and skin staples Xeroform 4 x 4's ABDs and web roll and Ace wrap applied along with a Polar Care. Patient sent to recovery room in stable condition PLAN OF CARE: Admit to inpatient   PATIENT DISPOSITION:  PACU - hemodynamically stable.

## 2015-05-06 NOTE — Transfer of Care (Signed)
Immediate Anesthesia Transfer of Care Note  Patient: Olivia Lewis  Procedure(s) Performed: Procedure(s): TOTAL KNEE ARTHROPLASTY (Left)  Patient Location: PACU  Anesthesia Type:Spinal  Level of Consciousness: awake  Airway & Oxygen Therapy: Patient Spontanous Breathing and Patient connected to face mask oxygen  Post-op Assessment: Report given to RN  Post vital signs: Reviewed  Last Vitals:  Filed Vitals:   05/06/15 1214  BP: 104/67  Pulse: 62  Temp: 36.1 C  Resp: 20    Complications: No apparent anesthesia complications

## 2015-05-06 NOTE — H&P (Signed)
Reviewed paper H+P, will be scanned into chart. No changes noted.  

## 2015-05-06 NOTE — Care Management Note (Addendum)
Case Management Note  Patient Details  Name: Olivia Lewis MRN: [id number] Date of Birth: Mar 09, 1929  Subjective/Objective:                  Met with patient and her sister to discuss discharge planning. Patient received from PACU today; PT pending tomorrow. Patient states she plans to return home with her sister followed by [place] home health like before. She has a front wheeled walker at home She uses [facility] for Rx [id number]  Action/Plan: RNCM will continue to follow. Referral called to [facility]. Lovenox 82m #14 called in to [facility] for price and availability.   Expected Discharge Date:                  Expected Discharge Plan:     In-House Referral:     Discharge planning Services  CM Consult  Post Acute Care Choice:    Choice offered to:  Patient, Sibling  DME Arranged:    DME Agency:     HH Arranged:  PT HH Agency:  [facility] Status of Service:     Medicare Important Message Given:    Date Medicare IM Given:    Medicare IM give by:    Date Additional Medicare IM Given:    Additional Medicare Important Message give by:     If discussed at LBonifayof Stay Meetings, dates discussed:    Additional Comments: [facility] accepted this patient for home health. Lovenox $113.13.   AMarshell Garfinkel RN 05/06/2015, 2:06 PM

## 2015-05-06 NOTE — Anesthesia Preprocedure Evaluation (Addendum)
Anesthesia Evaluation  Patient identified by MRN, date of birth, ID band  Reviewed: Allergy & Precautions, NPO status , Patient's Chart, lab work & pertinent test results, reviewed documented beta blocker date and time   Airway Mallampati: III       Dental  (+) Chipped   Pulmonary          Cardiovascular hypertension,     Neuro/Psych    GI/Hepatic   Endo/Other  Hypothyroidism   Renal/GU Renal InsufficiencyRenal disease     Musculoskeletal  (+) Arthritis -,   Abdominal   Peds  Hematology  (+) anemia ,   Anesthesia Other Findings   Reproductive/Obstetrics                            Anesthesia Physical Anesthesia Plan  ASA: III  Anesthesia Plan: Spinal   Post-op Pain Management:    Induction:   Airway Management Planned:   Additional Equipment:   Intra-op Plan:   Post-operative Plan:   Informed Consent: I have reviewed the patients History and Physical, chart, labs and discussed the procedure including the risks, benefits and alternatives for the proposed anesthesia with the patient or authorized representative who has indicated his/her understanding and acceptance.     Plan Discussed with: CRNA  Anesthesia Plan Comments:         Anesthesia Quick Evaluation

## 2015-05-06 NOTE — Anesthesia Procedure Notes (Signed)
Spinal Patient location during procedure: OR Start time: 05/06/2015 10:03 AM End time: 05/06/2015 10:08 AM Staffing Anesthesiologist: Gunnar Bulla Resident/CRNA: Rolla Plate Performed by: resident/CRNA  Preanesthetic Checklist Completed: patient identified, site marked, surgical consent, pre-op evaluation, timeout performed, IV checked, risks and benefits discussed and monitors and equipment checked Spinal Block Patient position: sitting Prep: Betadine and site prepped and draped Patient monitoring: heart rate, continuous pulse ox, blood pressure and cardiac monitor Approach: midline Location: L4-5 Injection technique: single-shot Needle Needle type: Whitacre and Introducer  Needle gauge: 24 G Needle length: 5 cm Additional Notes Negative paresthesia. Negative blood return. Positive free-flowing CSF. Expiration date of kit checked and confirmed. Patient tolerated procedure well, without complications.

## 2015-05-06 NOTE — Progress Notes (Addendum)
Patient with Olivia Lewis with Dr. Rudene Christians. Pain controlled well with alternating iv & PO pain meds. Continue to monitor.

## 2015-05-07 LAB — CBC
HCT: 37.8 % (ref 35.0–47.0)
HEMOGLOBIN: 12.5 g/dL (ref 12.0–16.0)
MCH: 28.8 pg (ref 26.0–34.0)
MCHC: 33 g/dL (ref 32.0–36.0)
MCV: 87.2 fL (ref 80.0–100.0)
Platelets: 236 10*3/uL (ref 150–440)
RBC: 4.33 MIL/uL (ref 3.80–5.20)
RDW: 14.3 % (ref 11.5–14.5)
WBC: 12.6 10*3/uL — ABNORMAL HIGH (ref 3.6–11.0)

## 2015-05-07 LAB — BASIC METABOLIC PANEL
Anion gap: 8 (ref 5–15)
BUN: 21 mg/dL — ABNORMAL HIGH (ref 6–20)
CALCIUM: 8.9 mg/dL (ref 8.9–10.3)
CO2: 27 mmol/L (ref 22–32)
Chloride: 103 mmol/L (ref 101–111)
Creatinine, Ser: 1.23 mg/dL — ABNORMAL HIGH (ref 0.44–1.00)
GFR calc non Af Amer: 39 mL/min — ABNORMAL LOW (ref >60)
GFR, EST AFRICAN AMERICAN: 45 mL/min — AB (ref >60)
Glucose, Bld: 113 mg/dL — ABNORMAL HIGH (ref 65–99)
Potassium: 4.5 mmol/L (ref 3.5–5.1)
Sodium: 138 mmol/L (ref 135–145)

## 2015-05-07 NOTE — Evaluation (Signed)
Physical Therapy Evaluation Patient Details Name: Olivia Lewis MRN: [id number] DOB: [birth year] Today's Date: 05/07/2015   History of Present Illness  Pt underwent L TKR and is POD#1. Pt has a history of R TKR 2 years ago. After discharge pt reports she will return home and her sister will stay with her. No reported falls in the last 12 months  Clinical Impression  Pt demonstrates good sequencing with bed mobility and transfers. She is able to ambulate to door and back to chair with progressively increased speed and WB on LLE. Pt is primarily limited by pain at this time. She will be safe to return home with [facility] PT and assist from her sister. Pt will benefit from skilled PT services to address deficits in strength, balance, and mobility in order to return to full function at home.    Follow Up Recommendations Home health PT    Equipment Recommendations  None recommended by PT    Recommendations for Other Services       Precautions / Restrictions Precautions Precautions: Knee Precaution Booklet Issued: Yes (comment) Required Braces or Orthoses:  (Able to perform 10 SLR on LLE) Restrictions Weight Bearing Restrictions: Yes LLE Weight Bearing: Weight bearing as tolerated      Mobility  Bed Mobility Overal bed mobility: Needs Assistance Bed Mobility: Supine to Sit     Supine to sit: Min assist     General bed mobility comments: Assist for trunk when moving to upright. Good strength noted in LLE abduction  Transfers Overall transfer level: Needs assistance Equipment used: Rolling walker (2 wheeled) Transfers: Sit to/from Stand Sit to Stand: Min guard         General transfer comment: Extended time to perform but sufficient RLE strength to perform without evidence for imbalance  Ambulation/Gait Ambulation/Gait assistance: Min guard Ambulation Distance (Feet): 30 Feet Assistive device: Rolling walker (2 wheeled) Gait Pattern/deviations: Decreased step length -  right;Decreased weight shift to left;Step-to pattern   Gait velocity interpretation: <1.8 ft/sec, indicative of risk for recurrent falls General Gait Details: Decreased speed and step length on RLE. Decreased weight acceptance LLE. Good sequencing following instruction  Stairs            Wheelchair Mobility    Modified Rankin (Stroke Patients Only)       Balance Overall balance assessment: Needs assistance Sitting-balance support: No upper extremity supported;Feet supported Sitting balance-Leahy Scale: Good     Standing balance support: Bilateral upper extremity supported Standing balance-Leahy Scale: Fair                               Pertinent Vitals/Pain Pain Assessment: 0-10 Pain Score: 2  (increases to 10/10 with ambulation) Pain Location: L knee Pain Intervention(s): Premedicated before session    Home Living Family/patient expects to be discharged to:: Private residence Living Arrangements: Alone (Sister will stay with her) Available Help at Discharge: Family Type of Home: House Home Access: Stairs to enter Entrance Stairs-Rails: Right Entrance Stairs-Number of Steps: 2 Home Layout: One level Home Equipment: [place] - 2 wheels;Cane - single point;Bedside commode;Toilet riser (walk-in shower with seat)      Prior Function Level of Independence: Independent               Hand Dominance   Dominant Hand: Right    Extremity/Trunk Assessment   Upper Extremity Assessment: Overall WFL for tasks assessed (4-/5 shoulder flexion)  Lower Extremity Assessment: Overall WFL for tasks assessed;LLE deficits/detail   LLE Deficits / Details: Able to perform full SLR and SAQ without assist. Post-op weakness of LLE. L ankle DF is 4+/5     Communication   Communication: No difficulties  Cognition Arousal/Alertness: Awake/alert Behavior During Therapy: WFL for tasks assessed/performed Overall Cognitive Status: Within Functional Limits  for tasks assessed                      General Comments      Exercises Total Joint Exercises Ankle Circles/Pumps: Strengthening;Both;10 reps;Supine Quad Sets: Strengthening;10 reps;Supine;Both Gluteal Sets: Strengthening;Both;10 reps;Supine Towel Squeeze: Strengthening;10 reps;Supine;Both Short Arc Quad: Strengthening;Both;10 reps;Supine Heel Slides: Strengthening;Both;10 reps;Supine Hip ABduction/ADduction: Strengthening;Both;10 reps;Supine Straight Leg Raises: Strengthening;Both;10 reps;Supine      Assessment/Plan    PT Assessment Patient needs continued PT services  PT Diagnosis Difficulty walking;Abnormality of gait;Generalized weakness;Acute pain   PT Problem List Decreased strength;Decreased range of motion;Decreased activity tolerance;Decreased balance;Decreased mobility;Pain  PT Treatment Interventions DME instruction;Gait training;Stair training;Functional mobility training;Therapeutic activities;Therapeutic exercise;Balance training;Neuromuscular re-education;Patient/family education;Manual techniques   PT Goals (Current goals can be found in the Care Plan section) Acute Rehab PT Goals Patient Stated Goal: "I'm planning on going home" PT Goal Formulation: With patient Time For Goal Achievement: 05/21/15 Potential to Achieve Goals: Good    Frequency BID   Barriers to discharge        Co-evaluation               End of Session Equipment Utilized During Treatment: Gait belt Activity Tolerance: Patient tolerated treatment well Patient left: in chair;with call bell/phone within reach;with chair alarm set;with family/visitor present;with SCD's reapplied (polar care and towel roll in place)           Time: 1050-1120 PT Time Calculation (min) (ACUTE ONLY): 30 min   Charges:   PT Evaluation $Initial PT Evaluation Tier I: 1 Procedure PT Treatments $Therapeutic Exercise: 8-22 mins   PT G Codes:       Olivia Lewis PT,  DPT [place] [facility] 05/07/2015, 11:39 AM

## 2015-05-07 NOTE — Clinical Social Work Note (Signed)
Clinical Social Work Assessment  Patient Details  Name: Olivia Lewis MRN: [id number] Date of Birth: [birth year]  Date of referral:  05/07/15               Reason for consult:  Facility Placement                Permission sought to share information with:  Family Supports Permission granted to share information::  Yes, Verbal Permission Granted  Name::        Agency::     Relationship::     Contact Information:     Housing/Transportation Living arrangements for the past 2 months:   (home) Source of Information:  Patient Patient Interpreter Needed:  None Criminal Activity/Legal Involvement Pertinent to Current Situation/Hospitalization:  No - Comment as needed Significant Relationships:  Siblings Lives with:  Self Do you feel safe going back to the place where you live?  Yes Need for family participation in patient care:  Yes (Comment)  Care giving concerns:  none   Social Worker assessment / plan:  Physical therapy has documented that their recommendation is for patient to return home with home health services. CSW met with patient and her sister this afternoon to discuss discharge planning and introduce the role of CSW. Patient and sister were very amenable to meeting with [place]. CSW explained that if the plan should change or the recommendation should change, that I could assist with facility rehab placement.   Patient stated that her sister will be staying with her to assist her and that she has gone through this before and had [place] home health follow her at that time with success. Patient states she is looking forward to being able to return home and has no concerns or issues surrounding discharge at this time.   Employment status:  Retired Nurse, adult PT Recommendations:  Home with [place] / Referral to community resources:     Patient/Family's Response to care:  positive  Patient/Family's Understanding of and Emotional  Response to Diagnosis, Current Treatment, and Prognosis:  Patient and sister verbalized understanding of situation and expressed gratitude for CSW's visit and assistance.  Emotional Assessment Appearance:  Appears stated age Attitude/Demeanor/Rapport:   (pleasant and cooperative) Affect (typically observed):  Appropriate Orientation:  Oriented to Self, Oriented to Place, Oriented to  Time, Oriented to Situation Alcohol / Substance use:  Not Applicable Psych involvement (Current and /or in the community):  No (Comment)  Discharge Needs  Concerns to be addressed:  Denies Needs/Concerns at this time Readmission within the last 30 days:  No Current discharge risk:  None Barriers to Discharge:  No Barriers Identified   Olivia Leff, LCSW 05/07/2015, 12:07 PM

## 2015-05-07 NOTE — Anesthesia Postprocedure Evaluation (Signed)
  Anesthesia Post-op Note  Patient: Olivia Lewis  Procedure(s) Performed: Procedure(s): TOTAL KNEE ARTHROPLASTY (Left)  Anesthesia type:Spinal  Patient location: PACU  Post pain: Pain level controlled  Post assessment: Post-op Vital signs reviewed, Patient's Cardiovascular Status Stable, Respiratory Function Stable, Patent Airway and No signs of Nausea or vomiting  Post vital signs: Reviewed and stable  Last Vitals:  Filed Vitals:   05/07/15 0745  BP: 136/69  Pulse: 66  Temp: 36.6 C  Resp: 18    Level of consciousness: awake, alert  and patient cooperative  Complications: No apparent anesthesia complications

## 2015-05-07 NOTE — Evaluation (Signed)
Occupational Therapy Evaluation Patient Details Name: Olivia Lewis MRN: [id number] DOB: January 22, 1929 Today's Date: 05/07/2015    History of Present Illness This patient is an 81 year old female who came to [facility] for a left TKR   Clinical Impression   This patient is a 81 year old female who came to [facility] for a L total knee replacement.  Patient lives in a one story home.  She had been independent with ADL and functional mobility. She now requires  assistance for lower body dressing. Patient did decline practicing lower body dressing but willing to review hip kit.      Follow Up Recommendations   (Home with home health PT, most likely will not need OT at discharge)    Equipment Recommendations       Recommendations for Other Services       Precautions / Restrictions Precautions Precautions: Knee Precaution Booklet Issued: Yes (comment) Required Braces or Orthoses:  (Able to perform 10 SLR on LLE) Restrictions Weight Bearing Restrictions: Yes LLE Weight Bearing: Weight bearing as tolerated      Mobility Bed Mobility          Transfers            Balance                                  ADL                                         General ADL Comments: Had been indepenent now requires assist Declined practicing lower body dressing but willing to review hip kit.     Vision     Perception     Praxis      Pertinent Vitals/Pain Patient reports 2 out of 10     Hand Dominance Right   Extremity/Trunk Assessment Upper Extremity Assessment Upper Extremity Assessment: Overall WFL for tasks assessed         Communication Communication Communication: No difficulties   Cognition Arousal/Alertness: Awake/alert Behavior During Therapy: WFL for tasks assessed/performed Overall Cognitive Status: Within Functional Limits for tasks assessed                     General Comments        Exercises      Shoulder Instructions      Home Living Family/patient expects to be discharged to:: Private residence Living Arrangements: Alone (Sister will be with her) Available Help at Discharge: Family Type of Home: House Home Access: Stairs to enter Technical brewer of Steps: 2 Entrance Stairs-Rails: Right Home Layout: One level               Home Equipment: Walker - 2 wheels;Cane - single point;Bedside commode;Toilet riser          Prior Functioning/Environment Level of Independence: Independent             OT Diagnosis: Generalized weakness   OT Problem List: Pain   OT Treatment/Interventions: Self-care/ADL training    OT Goals(Current goals can be found in the care plan section) Acute Rehab OT Goals Patient Stated Goal: "I'm planning on going home" OT Goal Formulation: With patient/family Potential to Achieve Goals: Good  OT Frequency: Min 1X/week   Barriers to D/C:  Co-evaluation              End of Session Equipment Utilized During Treatment:  (Hip kit)  Activity Tolerance:   Patient left:     Time: 1145-1001 OT Time Calculation (min): 1336 min Charges:  OT Evaluation $Initial OT Evaluation Tier I: 1 Procedure G-Codes:    Myrene Galas, MS/OTR/L  05/07/2015, 12:28 PM

## 2015-05-07 NOTE — Progress Notes (Addendum)
   Subjective: 1 Day Post-Op Procedure(s) (LRB): TOTAL KNEE ARTHROPLASTY (Left) Patient reports pain as 2 on 0-10 scale.   Patient is well, and has had no acute complaints or problems We will start therapy today.  Plan is to go Home after hospital stay.  Objective: Vital signs in last 24 hours: Temp:  [97 F (36.1 C)-98.2 F (36.8 C)] 97.7 F (36.5 C) (06/15 0537) Pulse Rate:  [46-71] 65 (06/15 0537) Resp:  [16-20] 17 (06/15 0537) BP: (104-143)/(56-78) 135/62 mmHg (06/15 0537) SpO2:  [94 %-100 %] 94 % (06/15 0537) FiO2 (%):  [21 %] 21 % (06/14 1328) Weight:  [90.266 kg (199 lb)] 90.266 kg (199 lb) (06/14 0842)  Intake/Output from previous day: 06/14 0701 - 06/15 0700 In: 2401 [I.V.:2351; IV Piggyback:50] Out: 1250 [Urine:1200; Blood:50] Intake/Output this shift:     Recent Labs  05/06/15 1344 05/07/15 0522  HGB 12.1 12.5    Recent Labs  05/06/15 1344 05/07/15 0522  WBC 8.8 12.6*  RBC 4.19 4.33  HCT 36.5 37.8  PLT 242 236    Recent Labs  05/06/15 1344 05/07/15 0522  NA  --  138  K  --  4.5  CL  --  103  CO2  --  27  BUN  --  21*  CREATININE 1.27* 1.23*  GLUCOSE  --  113*  CALCIUM  --  8.9   No results for input(s): LABPT, INR in the last 72 hours.  EXAM General - Patient is Alert, Appropriate and Oriented Extremity - Neurovascular intact Sensation intact distally Intact pulses distally Dorsiflexion/Plantar flexion intact Dressing - dressing C/D/I and no drainage Motor Function - intact, moving foot and toes well on exam.   Past Medical History  Diagnosis Date  . Hyperlipidemia   . Hypertension   . Allergy   . Hypothyroidism   . Chronic kidney disease     elevated creatine  . Arthritis   . Cancer     skin  . Anemia   . Diverticulosis     Assessment/Plan:   1 Day Post-Op Procedure(s) (LRB): TOTAL KNEE ARTHROPLASTY (Left) Active Problems:   Primary osteoarthritis of knee  Estimated body mass index is 38.86 kg/(m^2) as calculated  from the following:   Height as of this encounter: 5' (1.524 m).   Weight as of this encounter: 90.266 kg (199 lb). Advance diet Up with therapy  CM to assist with discharge   DVT Prophylaxis - Lovenox Weight-Bearing as tolerated to left leg D/C O2 and Pulse OX and try on Room Air  T. Rachelle Hora, PA-C [place] 05/07/2015, 7:31 AM

## 2015-05-07 NOTE — Progress Notes (Signed)
Physical Therapy Treatment Patient Details Name: Olivia Lewis MRN: [id number] DOB: 1929-04-12 Today's Date: 05/07/2015    History of Present Illness Pt underwent L TKR and is POD#1. Pt has a history of R TKR 2 years ago. After discharge pt reports she will return home and her sister will stay with her. No reported falls in the last 12 months    PT Comments    Pt demonstrates slightly improved sequencing this afternoon but still struggles with sit to supine transfer. Sit to stand takes extended time and pt continues to limit weight acceptance on LLE but this improves throughout session. Ambulation distance is limited by pain and fatigue. However, overall pt is making good progress with therapy and is still appropriate to return home with HHPT and assist from sister. Pt will benefit from skilled PT services to address deficits in strength, balance, and mobility in order to return to full function at home.   Follow Up Recommendations  Home health PT     Equipment Recommendations  None recommended by PT    Recommendations for Other Services       Precautions / Restrictions Precautions Precautions: Knee Precaution Booklet Issued: Yes (comment) Required Braces or Orthoses:  (Able to perform 10 SLR on LLE) Restrictions Weight Bearing Restrictions: Yes LLE Weight Bearing: Weight bearing as tolerated    Mobility  Bed Mobility Overal bed mobility: Needs Assistance Bed Mobility: Sit to Supine     Supine to sit: Min assist     General bed mobility comments: Assist for LLE when laying back in bed. Pt requires education about using RLE to assist LLE when returning to bed  Transfers Overall transfer level: Needs assistance Equipment used: Rolling walker (2 wheeled) Transfers: Sit to/from Stand Sit to Stand: Min guard         General transfer comment: Pt still requires extended time to perform but sufficient RLE strength to perform without evidence for imbalance. Decreased  LE power noted and increased pain when upright and weight-bearing on LLE  Ambulation/Gait Ambulation/Gait assistance: Min guard Ambulation Distance (Feet): 45 Feet Assistive device: Rolling walker (2 wheeled) Gait Pattern/deviations: Decreased step length - right;Decreased stance time - left;Antalgic   Gait velocity interpretation: <1.8 ft/sec, indicative of risk for recurrent falls General Gait Details: Cues for upright posture and increased RLE step length. Gait speed is slightly faster than this morning but still limited. Pt continues with difficulty accepting weight onto LLE. Vitals monitored throughout and remain WNL. Pt fatigues quickly and requests return to bed.   Stairs            Wheelchair Mobility    Modified Rankin (Stroke Patients Only)       Balance Overall balance assessment: Needs assistance Sitting-balance support: No upper extremity supported;Feet supported Sitting balance-Leahy Scale: Good     Standing balance support: Bilateral upper extremity supported Standing balance-Leahy Scale: Fair (Able to stand at EOB without UE support)                      Cognition Arousal/Alertness: Awake/alert Behavior During Therapy: WFL for tasks assessed/performed Overall Cognitive Status: Within Functional Limits for tasks assessed                      Exercises Total Joint Exercises Ankle Circles/Pumps: Strengthening;Both;10 reps;Seated Quad Sets: Strengthening;10 reps;Both;Seated Gluteal Sets: Strengthening;Both;10 reps;Supine Towel Squeeze: Strengthening;10 reps;Both;Seated Short Arc Quad: Strengthening;Both;10 reps;Supine Heel Slides: Strengthening;Both;10 reps;Seated Hip ABduction/ADduction: Strengthening;Both;10 reps;Seated Straight Leg Raises:  Strengthening;Both;10 reps;Supine Long Arc Quad: Strengthening;Both;10 reps;Seated Marching in Standing: Strengthening;Both;10 reps;Seated    General Comments        Pertinent Vitals/Pain  Pain Assessment: 0-10 Pain Score: 2  Pain Location: L knee Pain Intervention(s): Monitored during session    Home Living Family/patient expects to be discharged to:: Private residence Living Arrangements: Alone (Sister will be with her) Available Help at Discharge: Family Type of Home: House Home Access: Stairs to enter Entrance Stairs-Rails: Right Home Layout: One level Home Equipment: Environmental consultant - 2 wheels;Cane - single point;Bedside commode;Toilet riser      Prior Function Level of Independence: Independent          PT Goals (current goals can now be found in the care plan section) Acute Rehab PT Goals Patient Stated Goal: "I'm planning on going home" PT Goal Formulation: With patient Time For Goal Achievement: 05/21/15 Potential to Achieve Goals: Good Progress towards PT goals: Progressing toward goals    Frequency  BID    PT Plan Current plan remains appropriate    Co-evaluation             End of Session Equipment Utilized During Treatment: Gait belt Activity Tolerance: Patient tolerated treatment well Patient left: with SCD's reapplied;in bed;with bed alarm set;with family/visitor present (polar care and towel roll in place)     Time: 4782-9562 PT Time Calculation (min) (ACUTE ONLY): 29 min  Charges:  $Gait Training: 8-22 mins $Therapeutic Exercise: 8-22 mins                    G Codes:      Lyndel Safe Kayana Thoen PT, DPT   Aryani Daffern 05/07/2015, 3:20 PM

## 2015-05-08 LAB — COMPREHENSIVE METABOLIC PANEL
ALT: 12 U/L — ABNORMAL LOW (ref 14–54)
ANION GAP: 8 (ref 5–15)
AST: 24 U/L (ref 15–41)
Albumin: 3.2 g/dL — ABNORMAL LOW (ref 3.5–5.0)
Alkaline Phosphatase: 39 U/L (ref 38–126)
BILIRUBIN TOTAL: 0.9 mg/dL (ref 0.3–1.2)
BUN: 19 mg/dL (ref 6–20)
CALCIUM: 8.8 mg/dL — AB (ref 8.9–10.3)
CHLORIDE: 104 mmol/L (ref 101–111)
CO2: 25 mmol/L (ref 22–32)
CREATININE: 1.11 mg/dL — AB (ref 0.44–1.00)
GFR calc Af Amer: 51 mL/min — ABNORMAL LOW (ref >60)
GFR, EST NON AFRICAN AMERICAN: 44 mL/min — AB (ref >60)
Glucose, Bld: 103 mg/dL — ABNORMAL HIGH (ref 65–99)
Potassium: 3.6 mmol/L (ref 3.5–5.1)
Sodium: 137 mmol/L (ref 135–145)
Total Protein: 6.3 g/dL — ABNORMAL LOW (ref 6.5–8.1)

## 2015-05-08 LAB — CBC
HEMATOCRIT: 37.5 % (ref 35.0–47.0)
Hemoglobin: 13 g/dL (ref 12.0–16.0)
MCH: 29.8 pg (ref 26.0–34.0)
MCHC: 34.5 g/dL (ref 32.0–36.0)
MCV: 86.4 fL (ref 80.0–100.0)
PLATELETS: 220 10*3/uL (ref 150–440)
RBC: 4.34 MIL/uL (ref 3.80–5.20)
RDW: 14.1 % (ref 11.5–14.5)
WBC: 13.2 10*3/uL — ABNORMAL HIGH (ref 3.6–11.0)

## 2015-05-08 LAB — SURGICAL PATHOLOGY

## 2015-05-08 NOTE — Progress Notes (Signed)
Patient up ambulating in hallway with physical therapy.

## 2015-05-08 NOTE — Progress Notes (Signed)
Seen the pt, she doesn't have any complains. HR and BP are stable now.  May be HR was fast in night due to anxiety. No need for any new meds.  Can be discharged , when OK from orthopedics.

## 2015-05-08 NOTE — Progress Notes (Signed)
Post-op day 2- s/p left total knee, no complain of pain, iv site intact - left hand, dressing to the left knee dry/clean/intact with staples-dressing changed today. Given colace & prune juice for constipation. Lovenox teaching ongoing. Pending discharge to home with home health tomorrow.

## 2015-05-08 NOTE — Progress Notes (Signed)
OT Cancellation Note  Patient Details Name: Olivia Lewis MRN: [id number] DOB: [birth year]   Cancelled Treatment:     Patient declined dressing today. Will try again tomorrow time permitting.  Sharon Mt 05/08/2015, 2:11 PM

## 2015-05-08 NOTE — Consult Note (Signed)
Requesting physician: Dr. Rudene Christians. Date of consultation: 05/08/2015. Reason for consultation: Tachycardia.   History of present illness: Called by the nurse to evaluate patient noted to be tachycardic per orthopedic request. Per RN, status post left total knee replacement POD #2, comfortably resting in the bed and not in any kind of distress, vital signs stable with a blood pressure of 130/80 O2 saturations mid 90s on room air, denies any complaints of chest pain, shortness of breath, fever, nausea, vomiting, abdominal pain. Mild knee pain + tender site of surgery. No bleeding locally.  Past medical history: Hypertension. Allergies: NKDA.  Review of systems: 10 point ROS negative. Exam: Patient alert awake and oriented 3, comfortable resting in the bed, not in any distress. Vital signs stable as noted.            HEENT: NAD            Neck: Supple, no JVD            Lungs: Bilateral vesicular breath sounds present, no rales /rhonchi.            Heart: S1-S2 regular, tachycardia. No murmurs            Abdomen: Soft, nontender, bowel sounds present.            Extremities: No edema, peripheral pulses +. Surgical site or left knee with a dressing in place-clean.  EKG: Sinus tachycardia with ventricular rate of 1 40 bpm, LAD. Q waves in inferior leads.  A&P: Tachycardia, unclear etiology. Patient is symptomatic, vital signs stable, EKG NSR with VR 140., no hypoxia.           Status post left total knee replacement, POD 2.           History of hypertension, well controlled on home medications.  Plan: Monitor clinically, obtain labs-CBC, CMP.  Thank you for the consultation. Will follow with you.

## 2015-05-08 NOTE — Progress Notes (Signed)
   Subjective: 2 Days Post-Op Procedure(s) (LRB): TOTAL KNEE ARTHROPLASTY (Left) Patient reports pain as 2 on 0-10 scale.   Patient is well, has no complaints. Denies chest pain, SOB, calf pain. Tachycardic, HR 145. Medicine consulted We will continue therapy today.  Plan is to go Home after hospital stay.  Objective: Vital signs in last 24 hours: Temp:  [97.9 F (36.6 C)-98.7 F (37.1 C)] 98.4 F (36.9 C) (06/16 0402) Pulse Rate:  [66-145] 135 (06/16 0419) Resp:  [17-20] 18 (06/16 0419) BP: (136-143)/(54-85) 136/85 mmHg (06/16 0402) SpO2:  [93 %-99 %] 95 % (06/16 0419)  Intake/Output from previous day: 06/15 0701 - 06/16 0700 In: 2360 [P.O.:960; I.V.:1400] Out: 1625 [Urine:1625] Intake/Output this shift:     Recent Labs  05/06/15 1344 05/07/15 0522 05/08/15 0541  HGB 12.1 12.5 13.0    Recent Labs  05/07/15 0522 05/08/15 0541  WBC 12.6* 13.2*  RBC 4.33 4.34  HCT 37.8 37.5  PLT 236 220    Recent Labs  05/07/15 0522 05/08/15 0541  NA 138 137  K 4.5 3.6  CL 103 104  CO2 27 25  BUN 21* 19  CREATININE 1.23* 1.11*  GLUCOSE 113* 103*  CALCIUM 8.9 8.8*   No results for input(s): LABPT, INR in the last 72 hours.  EXAM General - Patient is Alert, Appropriate and Oriented Extremity - Neurovascular intact Sensation intact distally Intact pulses distally Dorsiflexion/Plantar flexion intact  - homas sign BLE Dressing - dressing C/D/I and no drainage, dressing changed to honeycomb Motor Function - intact, moving foot and toes well on exam.   Past Medical History  Diagnosis Date  . Hyperlipidemia   . Hypertension   . Allergy   . Hypothyroidism   . Chronic kidney disease     elevated creatine  . Arthritis   . Cancer     skin  . Anemia   . Diverticulosis     Assessment/Plan:   2 Days Post-Op Procedure(s) (LRB): TOTAL KNEE ARTHROPLASTY (Left) Active Problems:   Primary osteoarthritis of knee  Estimated body mass index is 38.86 kg/(m^2) as  calculated from the following:   Height as of this encounter: 5' (1.524 m).   Weight as of this encounter: 90.266 kg (199 lb). Advance diet Up with therapy  CM to assist with discharge, home with HHPT tomorrow Needs BM Dc iv fluids Appreciate medicine following  DVT Prophylaxis - Lovenox Weight-Bearing as tolerated to left leg D/C O2 and Pulse OX and try on Room Air  T. Rachelle Hora, PA-C [place] 05/08/2015, 7:18 AM

## 2015-05-08 NOTE — Progress Notes (Signed)
Physical Therapy Treatment Patient Details Name: Olivia Lewis MRN: [id number] DOB: 1929/09/13 Today's Date: 05/08/2015    History of Present Illness Pt underwent L TKR and is POD#1. Pt has a history of R TKR 2 years ago. After discharge pt reports she will return home and her sister will stay with her. No reported falls in the last 12 months    PT Comments    Pt progressing slowly, but in all areas. Demonstrates understanding with exercise, transfers, bed mobility and ambulation; but requires cueing for consistency and carryover. Continue this pm to progress active assisted range of motion/active range of motion, strength, transfers, bed mobility and gait. Will need to perform steps before discharging home, as she has 2 steps to enter home.   Follow Up Recommendations  Home health PT     Equipment Recommendations  None recommended by PT    Recommendations for Other Services       Precautions / Restrictions Precautions Precautions: Knee Restrictions Weight Bearing Restrictions: Yes LLE Weight Bearing: Weight bearing as tolerated    Mobility  Bed Mobility Overal bed mobility: Needs Assistance Bed Mobility: Supine to Sit     Supine to sit: Min assist (LLE and trunk)        Transfers Overall transfer level: Needs assistance Equipment used: Rolling walker (2 wheeled) Transfers: Sit to/from Stand Sit to Stand: Min assist         General transfer comment: cues for hands  Ambulation/Gait Ambulation/Gait assistance: Min guard Ambulation Distance (Feet): 50 Feet Assistive device: Rolling walker (2 wheeled) Gait Pattern/deviations: Step-through pattern;Decreased step length - right;Decreased stance time - left;Decreased stride length;Decreased dorsiflexion - left;Decreased weight shift to left;Antalgic;Trunk flexed (Improved to step through with cues; initally step to)   Gait velocity interpretation: <1.8 ft/sec, indicative of risk for recurrent falls General  Gait Details: Cues for equal step lengths by decreasing L and increasing R step length. Cues for appopriate rw distance in regards to body. Improved with continued practice   Stairs            Wheelchair Mobility    Modified Rankin (Stroke Patients Only)       Balance Overall balance assessment: Needs assistance       Postural control:  (forward flexed) Standing balance support: Bilateral upper extremity supported Standing balance-Leahy Scale: Fair                      Cognition Arousal/Alertness: Awake/alert Behavior During Therapy: WFL for tasks assessed/performed Overall Cognitive Status: Within Functional Limits for tasks assessed                      Exercises Total Joint Exercises Ankle Circles/Pumps: AROM;Both;20 reps;Seated Quad Sets: Strengthening;Both;20 reps;Seated Gluteal Sets: Strengthening;Both;20 reps;Seated Heel Slides: Left;10 reps;AAROM;Seated (3 positions each repetition with 10 second hold each) Straight Leg Raises: AAROM;Left;10 reps;Seated (semi reclined) Long Arc Quad: AAROM;Left;20 reps;Seated Goniometric ROM: 0-95 degrees    General Comments        Pertinent Vitals/Pain Pain Assessment: 0-10 Pain Score: 7  Pain Location: L knee Pain Intervention(s): Monitored during session;Premedicated before session;Limited activity within patient's tolerance    Home Living                      Prior Function            PT Goals (current goals can now be found in the care plan section) Progress towards PT goals: Progressing toward  goals    Frequency  BID    PT Plan Current plan remains appropriate    Co-evaluation             End of Session Equipment Utilized During Treatment: Gait belt Activity Tolerance: Patient tolerated treatment well Patient left: in bed;with call bell/phone within reach;with chair alarm set;with family/visitor present (polar care and foot pumps reapplied)     Time: 4562-5638 PT  Time Calculation (min) (ACUTE ONLY): 32 min  Charges:  $Gait Training: 8-22 mins $Therapeutic Exercise: 8-22 mins                    G Codes:      Charlaine Dalton 05/08/2015, 11:37 AM

## 2015-05-08 NOTE — Progress Notes (Signed)
MD poggi for heart rate 130's-140's. Stat EKG ordered and prime doc also advised.

## 2015-05-08 NOTE — Progress Notes (Signed)
   05/08/15 0900  Clinical Encounter Type  Visited With Patient  Visit Type Follow-up  Spiritual Encounters  Spiritual Needs Prayer  Stress Factors  Patient Stress Factors Health changes    Faith Tradition: [employer] Status: oriented alert, good spirits Age/Sex: 6 Female Family: none present but supportive Visit Assessment: The patient welcomed the chaplain into her room for another visit. She began to talk about her second knee procedure, she said the bandages have been removed. She said that she had a rough spell this AM with her heart but she's not worried and doing just fine. 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  Chaplains and pastoral care can be reached 24x7, via pager [phone] or by submitting an online order

## 2015-05-08 NOTE — Progress Notes (Addendum)
error 

## 2015-05-08 NOTE — Progress Notes (Signed)
Physical Therapy Treatment Patient Details Name: Olivia Lewis MRN: [id number] DOB: 09/09/1929 Today's Date: 05/08/2015    History of Present Illness Pt underwent L TKR and is POD#1. Pt has a history of R TKR 2 years ago. After discharge pt reports she will return home and her sister will stay with her. No reported falls in the last 12 months    PT Comments    Pt tolerated up in chair since morning session well. L knee stiff, but demonstrates good range with stretching post change of position. Pt wished to ambulate; nursing awaiting to give pt suppository post ambulation and will return pt to bed per pt request.   Follow Up Recommendations  Home health PT     Equipment Recommendations  None recommended by PT    Recommendations for Other Services       Precautions / Restrictions Precautions Precautions: Knee Restrictions Weight Bearing Restrictions: Yes LLE Weight Bearing: Weight bearing as tolerated    Mobility  Bed Mobility Overal bed mobility: Needs Assistance Bed Mobility: Supine to Sit     Supine to sit: Min assist (LLE and trunk)     General bed mobility comments: Not tested; pt up in chair and left with nursing on commode  Transfers Overall transfer level: Needs assistance Equipment used: Rolling walker (2 wheeled) Transfers: Sit to/from Stand Sit to Stand: Min guard (as long as pt pushes with BUEs from the chair)         General transfer comment: cues for hands  Ambulation/Gait Ambulation/Gait assistance: Min guard Ambulation Distance (Feet): 50 Feet Assistive device: Rolling walker (2 wheeled) Gait Pattern/deviations: Step-through pattern;Antalgic;Decreased dorsiflexion - right;Decreased dorsiflexion - left;Decreased weight shift to left;Trunk flexed Gait velocity: Reduced Gait velocity interpretation: <1.8 ft/sec, indicative of risk for recurrent falls General Gait Details: Cues for equal step lengths by decreasing L and increasing R step  length. Cues for appopriate rw distance in regards to body. Improved with continued practice   Stairs            Wheelchair Mobility    Modified Rankin (Stroke Patients Only)       Balance Overall balance assessment: Needs assistance       Postural control:  (forward flexed) Standing balance support: Bilateral upper extremity supported Standing balance-Leahy Scale: Fair                      Cognition Arousal/Alertness: Awake/alert Behavior During Therapy: WFL for tasks assessed/performed Overall Cognitive Status: Within Functional Limits for tasks assessed                      Exercises Total Joint Exercises Ankle Circles/Pumps: AROM;Both;20 reps;Seated Quad Sets: Strengthening;Both;20 reps;Seated Gluteal Sets: Strengthening;Both;20 reps;Seated Heel Slides: Left;10 reps;AAROM;Seated (3 positions for each repetition with 10 second hold) Straight Leg Raises: AAROM;Left;10 reps;Seated (semi reclined) Long Arc Quad: AAROM;Left;20 reps;Seated Goniometric ROM: 0-95 degrees Marching in Standing: AAROM;Left;20 reps;Seated    General Comments        Pertinent Vitals/Pain Pain Assessment: 0-10 Pain Score: 6  Pain Location: L knee Pain Intervention(s): Limited activity within patient's tolerance;Monitored during session    Home Living                      Prior Function            PT Goals (current goals can now be found in the care plan section) Progress towards PT goals: Progressing toward goals  Frequency  BID    PT Plan Current plan remains appropriate    Co-evaluation             End of Session Equipment Utilized During Treatment: Gait belt Activity Tolerance: Patient tolerated treatment well Patient left:  (In bathroom with nursing for toileting/suppository)     Time: 0623-7628 PT Time Calculation (min) (ACUTE ONLY): 16 min  Charges:  $Gait Training: 8-22 mins $Therapeutic Exercise: 8-22 mins                     G Codes:      Charlaine Dalton 05/08/2015, 2:54 PM

## 2015-05-09 MED ORDER — OXYCODONE HCL 5 MG PO TABS: 5.0000 mg | ORAL_TABLET | ORAL | Status: DC | PRN

## 2015-05-09 MED ORDER — ENOXAPARIN SODIUM 40 MG/0.4ML ~~LOC~~ SOLN: 40.0000 mg | SUBCUTANEOUS | Status: DC

## 2015-05-09 NOTE — Discharge Instructions (Signed)

## 2015-05-09 NOTE — Discharge Summary (Signed)
Physician Discharge Summary  Patient ID: Olivia Lewis MRN: [id number] DOB/AGE: 1929/05/08 81 y.o.  Admit date: 05/06/2015 Discharge date: 05/09/2015  Admission Diagnoses:  Left knee osteoarthritis  Discharge Diagnoses: Patient Active Problem List   Diagnosis Date Noted  . Primary osteoarthritis of knee 05/06/2015  . Baker's cyst of knee 05/30/2014    Past Medical History  Diagnosis Date  . Hyperlipidemia   . Hypertension   . Allergy   . Hypothyroidism   . Chronic kidney disease     elevated creatine  . Arthritis   . Cancer     skin  . Anemia   . Diverticulosis      Transfusion: None   Consultants (if any):   medicine consult at on postop day 2 for tachycardia.  Discharged Condition: Improved  Hospital Course: Olivia Lewis is an 81 y.o. female who was admitted 05/06/2015 with a diagnosis of <principal problem not specified> and went to the operating room on 05/06/2015 and underwent the above named procedures.    Surgeries: Procedure(s): TOTAL KNEE ARTHROPLASTY on 05/06/2015 Patient tolerated the surgery well. Taken to PACU where she was stabilized and then transferred to the orthopedic floor.  Started on Lovenox 30 q 12 hrs. Foot pumps applied bilaterally at 80 mm. Heels elevated on bed with rolled towels. No evidence of DVT. Negative Homan. Physical therapy started on day #1 for gait training and transfer. OT started day #1 for ADL and assisted devices.  Patient's IV , foley and hemovac was d/c by postop day #2.  On postop day 2, patient developed tachycardia. Medicine was consult could and provided workup for the patient. Patient was in sinus tachycardia. Labs were normal. No chest pain or shortness of breath. Fluids were discontinued. Heart rate improved. I postop day 3 patient was stable and ready for discharge to home with home health physical therapy.  Implants: Medacta GMK sphere 4 left femur, 4 left tibia, 13 mm insert and to patella all  components cemented  She was given perioperative antibiotics:  Anti-infectives    Start     Dose/Rate Route Frequency Ordered Stop   05/06/15 1330  clindamycin (CLEOCIN) IVPB 900 mg     900 mg 100 mL/hr over 30 Minutes Intravenous Every 6 hours 05/06/15 1327 05/07/15 0104   05/06/15 0845  clindamycin (CLEOCIN) IVPB 900 mg     900 mg 100 mL/hr over 30 Minutes Intravenous  Once 05/06/15 0833 05/06/15 1026   05/06/15 0747  clindamycin (CLEOCIN) 900 MG/50ML IVPB    Comments:  Jola [facility]: cabinet override      05/06/15 0747 05/06/15 1959    .  She was given sequential compression devices, early ambulation, and Lovenox for DVT prophylaxis.  She benefited maximally from the hospital stay and there were no complications.    Recent vital signs:  Filed Vitals:   05/09/15 0732  BP: 154/76  Pulse: 69  Temp: 98.6 F (37 C)  Resp: 18    Recent laboratory studies:  Lab Results  Component Value Date   HGB 13.0 05/08/2015   HGB 12.5 05/07/2015   HGB 12.1 05/06/2015   Lab Results  Component Value Date   WBC 13.2* 05/08/2015   PLT 220 05/08/2015   Lab Results  Component Value Date   INR 0.92 04/24/2015   Lab Results  Component Value Date   NA 137 05/08/2015   K 3.6 05/08/2015   CL 104 05/08/2015   CO2 25 05/08/2015   BUN 19 05/08/2015  CREATININE 1.11* 05/08/2015   GLUCOSE 103* 05/08/2015    Discharge Medications:     Medication List    TAKE these medications        acetaminophen 500 MG tablet  Commonly known as:  TYLENOL  Take 1,000 mg by mouth daily as needed.     amLODipine-benazepril 5-40 MG per capsule  Commonly known as:  LOTREL  Take 1 capsule by mouth daily.     aspirin EC 81 MG tablet  Take 81 mg by mouth daily.     atorvastatin 10 MG tablet  Commonly known as:  LIPITOR  Take 10 mg by mouth 1 day or 1 dose.     B-12 [id number] MCG Subl  Place under the tongue daily.     enoxaparin 40 MG/0.4ML injection  Commonly known as:  LOVENOX  Inject  0.4 mLs (40 mg total) into the skin daily.     EQL OYSTER SHELL CALCIUM/D 500-200 MG-UNIT Tabs  Generic drug:  Calcium Carb-Cholecalciferol  Take by mouth.     hydrochlorothiazide 12.5 MG tablet  Commonly known as:  HYDRODIURIL  Take 12.5 mg by mouth 1 day or 1 dose.     levothyroxine 50 MCG tablet  Commonly known as:  SYNTHROID, LEVOTHROID  Take 50 mcg by mouth daily at 6 (six) AM.     loratadine 10 MG tablet  Commonly known as:  CLARITIN  Take 10 mg by mouth daily as needed for allergies.     metoprolol succinate 50 MG 24 hr tablet  Commonly known as:  TOPROL-XL  Take 50 mg by mouth 1 day or 1 dose.     OSTEO BI-FLEX/5-LOXIN ADVANCED Tabs  Take 1 tablet by mouth daily.     oxyCODONE 5 MG immediate release tablet  Commonly known as:  Oxy IR/ROXICODONE  Take 1-2 tablets (5-10 mg total) by mouth every 3 (three) hours as needed for breakthrough pain.     SM FISH OIL 1000 MG Caps  Take 1 capsule by mouth daily.     STOOL SOFTENER 100 MG capsule  Generic drug:  docusate sodium  Take 100 mg by mouth daily as needed.        Diagnostic Studies: Dg Knee 1-2 Views Left  05-26-15   CLINICAL DATA:  Postop  EXAM: LEFT KNEE - 1-2 VIEW  COMPARISON:  03/27/2015  FINDINGS: Two views of the left knee submitted. There is left knee prosthesis with anatomic alignment. Postsurgical changes with midline skin staples. Small amount of periarticular soft tissue air.  IMPRESSION: Left knee prosthesis with anatomic alignment. Postsurgical changes are noted.   Electronically Signed   By: Lahoma Crocker M.D.   On: 05/26/2015 12:46    Disposition:  patient stable and ready for discharge to home with home health physical therapy. Condition at discharge stable        Follow-up Information    Follow up with MENZ,MICHAEL, MD In 2 weeks.   Specialty:  Orthopedic Surgery   Why:  For staple removal and skin check   Contact information:   [place] [address] [phone]        Signed: Feliberto Gottron 05/09/2015, 7:46 AM

## 2015-05-09 NOTE — Progress Notes (Signed)
Physical Therapy Treatment Patient Details Name: Olivia Lewis MRN: [id number] DOB: June 27, 1929 Today's Date: 05/09/2015    History of Present Illness POD3 total knee, has done well with PT.    PT Comments    Pt does well with PT, shows good confidence and though she needs some light assist with getting legs to EOB she does very well with mobility, gait, steps and generally shows good overall confidence and appropriate strength and ROM (>90 deg flexion again today)  Follow Up Recommendations  Home health PT     Equipment Recommendations  None recommended by PT    Recommendations for Other Services       Precautions / Restrictions Precautions Precautions: Fall Restrictions Weight Bearing Restrictions: Yes LLE Weight Bearing: Weight bearing as tolerated    Mobility  Bed Mobility Overal bed mobility: Needs Assistance Bed Mobility: Supine to Sit     Supine to sit: Min assist     General bed mobility comments: Pt still needing assist with L LE getting to EOB  Transfers Overall transfer level: Needs assistance Equipment used: Rolling walker (2 wheeled) Transfers: Sit to/from Stand Sit to Stand: Min guard         General transfer comment: Pt able to rise to standing numerous times today, shows good awareness and safety  Ambulation/Gait Ambulation/Gait assistance: Min guard Ambulation Distance (Feet): 100 Feet Assistive device: Rolling walker (2 wheeled)       General Gait Details: Pt with no obvious limp or hesitation, able to keep forward momentum with wheels and shows good confidence   Stairs Stairs: Yes Stairs assistance: Min guard Stair Management: One rail Right Number of Stairs: 4 General stair comments: Pt able to negotiate up/down with single and double rails  Wheelchair Mobility    Modified Rankin (Stroke Patients Only)       Balance                                    Cognition Arousal/Alertness:  Awake/alert Behavior During Therapy: WFL for tasks assessed/performed Overall Cognitive Status: Within Functional Limits for tasks assessed                      Exercises Total Joint Exercises Quad Sets: 10 reps;Strengthening Heel Slides: Strengthening;10 reps Hip ABduction/ADduction: 10 reps;Strengthening Long Arc Quad: AROM;10 reps Knee Flexion: Strengthening;10 reps (gentle overpressure) Goniometric ROM: 0-95    General Comments        Pertinent Vitals/Pain Pain Assessment: 0-10 Pain Score: 3  Pain Location: L knee    Home Living                      Prior Function            PT Goals (current goals can now be found in the care plan section) Progress towards PT goals: Progressing toward goals    Frequency  BID    PT Plan Current plan remains appropriate    Co-evaluation             End of Session Equipment Utilized During Treatment: Gait belt Activity Tolerance: Patient tolerated treatment well Patient left: with chair alarm set     Time: 9470-9628 PT Time Calculation (min) (ACUTE ONLY): 26 min  Charges:  $Gait Training: 8-22 mins $Therapeutic Exercise: 8-22 mins  G Codes:     Olivia Lewis, PT, DPT [phone]  Olivia Lewis 05/09/2015, 10:34 AM

## 2015-05-09 NOTE — Progress Notes (Signed)
Discharge via wheelchair with nursing tech.

## 2015-05-09 NOTE — Care Management Note (Signed)
Case Management Note  Patient Details  Name: Olivia Lewis MRN: [id number] Date of Birth: 1929-10-08  Subjective/Objective:   Notified [facility] of discharge. Pt updated on discharge plan. Will sign off.                 Action/Plan:   Expected Discharge Date:                  Expected Discharge Plan:     In-House Referral:     Discharge planning Services  CM Consult  Post Acute Care Choice:    Choice offered to:  Patient, Sibling  DME Arranged:    DME Agency:     HH Arranged:  PT HH Agency:  [facility]  Status of Service:  Completed, signed off  Medicare Important Message Given:  Yes Date Medicare IM Given:  05/08/15 Medicare IM give by:  Orvan July Date Additional Medicare IM Given:    Additional Medicare Important Message give by:     If discussed at [place] of Stay Meetings, dates discussed:    Additional Comments:  Jolly Mango, RN 05/09/2015, 8:37 AM

## 2015-05-09 NOTE — Progress Notes (Signed)
   Subjective: 3 Days Post-Op Procedure(s) (LRB): TOTAL KNEE ARTHROPLASTY (Left) Patient reports pain as 2 on 0-10 scale.   Patient is well, has no complaints. Denies chest pain, SOB, calf pain. HR improved We will continue therapy today.  Plan is to go Home today if good progress with PT.  Objective: Vital signs in last 24 hours: Temp:  [97.7 F (36.5 C)-98.6 F (37 C)] 98.6 F (37 C) (06/17 0732) Pulse Rate:  [64-81] 69 (06/17 0732) Resp:  [16-20] 18 (06/17 0732) BP: (101-154)/(50-76) 154/76 mmHg (06/17 0732) SpO2:  [94 %-96 %] 94 % (06/17 0732)  Intake/Output from previous day: 06/16 0701 - 06/17 0700 In: 1110 [P.O.:1110] Out: 154 [Urine:152; Stool:2] Intake/Output this shift: Total I/O In: -  Out: 200 [Urine:200]   Recent Labs  05/06/15 1344 05/07/15 0522 05/08/15 0541  HGB 12.1 12.5 13.0    Recent Labs  05/07/15 0522 05/08/15 0541  WBC 12.6* 13.2*  RBC 4.33 4.34  HCT 37.8 37.5  PLT 236 220    Recent Labs  05/07/15 0522 05/08/15 0541  NA 138 137  K 4.5 3.6  CL 103 104  CO2 27 25  BUN 21* 19  CREATININE 1.23* 1.11*  GLUCOSE 113* 103*  CALCIUM 8.9 8.8*   No results for input(s): LABPT, INR in the last 72 hours.  EXAM General - Patient is Alert, Appropriate and Oriented Extremity - Neurovascular intact Sensation intact distally Intact pulses distally Dorsiflexion/Plantar flexion intact  - homas sign BLE Dressing - dressing C/D/I and no drainage Motor Function - intact, moving foot and toes well on exam.   Past Medical History  Diagnosis Date  . Hyperlipidemia   . Hypertension   . Allergy   . Hypothyroidism   . Chronic kidney disease     elevated creatine  . Arthritis   . Cancer     skin  . Anemia   . Diverticulosis     Assessment/Plan:   3 Days Post-Op Procedure(s) (LRB): TOTAL KNEE ARTHROPLASTY (Left) Active Problems:   Primary osteoarthritis of knee  Estimated body mass index is 38.86 kg/(m^2) as calculated from the  following:   Height as of this encounter: 5' (1.524 m).   Weight as of this encounter: 90.266 kg (199 lb). Advance diet Up with therapy  CM to assist with discharge, home with HHPT today if good progress with physical therapy   DVT Prophylaxis - Lovenox Weight-Bearing as tolerated to left leg D/C O2 and Pulse OX and try on Room Air  T. Rachelle Hora, PA-C [place] 05/09/2015, 7:42 AM

## 2015-05-09 NOTE — Progress Notes (Signed)
Order to discharge patient to home today with home health, discharge instructions given per md order, home and new medications reviewed, rx..slip given, iv site discontinued from left hand - cath tip intact, dressing applied to site.  Patient verbalized understanding of discharge instructions given.

## 2016-04-07 ENCOUNTER — Encounter: Payer: Self-pay | Admitting: Podiatry

## 2016-04-07 ENCOUNTER — Ambulatory Visit (INDEPENDENT_AMBULATORY_CARE_PROVIDER_SITE_OTHER): Payer: Medicare Other | Admitting: Podiatry

## 2016-04-07 ENCOUNTER — Ambulatory Visit (INDEPENDENT_AMBULATORY_CARE_PROVIDER_SITE_OTHER): Payer: Medicare Other

## 2016-04-07 VITALS — BP 132/64 | HR 69 | Resp 16

## 2016-04-07 DIAGNOSIS — K219 Gastro-esophageal reflux disease without esophagitis: Secondary | ICD-10-CM | POA: Insufficient documentation

## 2016-04-07 DIAGNOSIS — M79673 Pain in unspecified foot: Secondary | ICD-10-CM

## 2016-04-07 DIAGNOSIS — M1990 Unspecified osteoarthritis, unspecified site: Secondary | ICD-10-CM | POA: Insufficient documentation

## 2016-04-07 DIAGNOSIS — M810 Age-related osteoporosis without current pathological fracture: Secondary | ICD-10-CM | POA: Insufficient documentation

## 2016-04-07 DIAGNOSIS — E785 Hyperlipidemia, unspecified: Secondary | ICD-10-CM | POA: Insufficient documentation

## 2016-04-07 DIAGNOSIS — E538 Deficiency of other specified B group vitamins: Secondary | ICD-10-CM | POA: Insufficient documentation

## 2016-04-07 DIAGNOSIS — I10 Essential (primary) hypertension: Secondary | ICD-10-CM | POA: Insufficient documentation

## 2016-04-07 NOTE — Progress Notes (Signed)
   Subjective:    Patient ID: Olivia Lewis, female    DOB: Nov 18, 1929, 82 y.o.   MRN: [id number]  HPI: She presents today with chief complaint of painful feet bilaterally. She states it's been going on for several months. He states that since she's had knee surgery her feet seem to be collapsing and rolling in and she walks she states that she can walk for exercise anymore because her feet hurt too badly.    Review of Systems  Musculoskeletal: Positive for back pain, arthralgias and gait problem.  All other systems reviewed and are negative.      Objective:   Physical Exam: Vital signs are stable she is alert and oriented 3. Pulses are palpable. Neurologic sensorium is intact deep tendon reflexes are intact muscle strength is +5 over 5 dorsiflexion plantar flexors and inverters everters all intrinsic musculature is intact. Hallux abductovalgus deformity with overlapping hammertoe deformity left foot. Minimal deformity right foot. Pes planus is noted bilateral. Radiographs do demonstrate osteoarthritis with pes planus bilateral upon standing she does have medial longitudinal arch collapse and internal rotation of her legs at her ankles.        Assessment & Plan:  Osteoarthritis and pes planus bilateral.  Plan: I encouraged her to consider physical therapy and orthotics she would like to go ahead and start with physical therapy to see if that made any difference and strengthening of her legs and balancing. At this point also recommended considering orthotics should physical therapy not totally take care of her problem. I think a medium to firm orthotic would be best for her.

## 2016-10-08 ENCOUNTER — Telehealth: Payer: Self-pay | Admitting: *Deleted

## 2016-10-08 NOTE — Telephone Encounter (Signed)
Pt states she saw Dr. Milinda Pointer in 03/2016, they had decided to strength her legs with PT prior to orthotics. Pt states she feels her legs and back will benefit from the orthotics and would like to schedule. I transferred to schedulers.

## 2016-10-09 ENCOUNTER — Emergency Department: Payer: Medicare Other

## 2016-10-09 ENCOUNTER — Emergency Department
Admission: EM | Admit: 2016-10-09 | Discharge: 2016-10-09 | Disposition: A | Discharge status: Home / Self Care | Payer: Medicare Other | Attending: Emergency Medicine | Admitting: Emergency Medicine

## 2016-10-09 ENCOUNTER — Encounter: Payer: Self-pay | Admitting: Emergency Medicine

## 2016-10-09 DIAGNOSIS — S0003XA Contusion of scalp, initial encounter: Secondary | ICD-10-CM | POA: Diagnosis not present

## 2016-10-09 DIAGNOSIS — Y9389 Activity, other specified: Secondary | ICD-10-CM | POA: Diagnosis not present

## 2016-10-09 DIAGNOSIS — Y92002 Bathroom of unspecified non-institutional (private) residence single-family (private) house as the place of occurrence of the external cause: Secondary | ICD-10-CM | POA: Diagnosis not present

## 2016-10-09 DIAGNOSIS — W1809XA Striking against other object with subsequent fall, initial encounter: Secondary | ICD-10-CM | POA: Insufficient documentation

## 2016-10-09 DIAGNOSIS — Z859 Personal history of malignant neoplasm, unspecified: Secondary | ICD-10-CM | POA: Diagnosis not present

## 2016-10-09 DIAGNOSIS — N189 Chronic kidney disease, unspecified: Secondary | ICD-10-CM | POA: Diagnosis not present

## 2016-10-09 DIAGNOSIS — Z79899 Other long term (current) drug therapy: Secondary | ICD-10-CM | POA: Diagnosis not present

## 2016-10-09 DIAGNOSIS — I129 Hypertensive chronic kidney disease with stage 1 through stage 4 chronic kidney disease, or unspecified chronic kidney disease: Secondary | ICD-10-CM | POA: Insufficient documentation

## 2016-10-09 DIAGNOSIS — Z7982 Long term (current) use of aspirin: Secondary | ICD-10-CM | POA: Diagnosis not present

## 2016-10-09 DIAGNOSIS — S0990XA Unspecified injury of head, initial encounter: Secondary | ICD-10-CM | POA: Diagnosis present

## 2016-10-09 DIAGNOSIS — E039 Hypothyroidism, unspecified: Secondary | ICD-10-CM | POA: Insufficient documentation

## 2016-10-09 DIAGNOSIS — W19XXXA Unspecified fall, initial encounter: Secondary | ICD-10-CM

## 2016-10-09 DIAGNOSIS — Y999 Unspecified external cause status: Secondary | ICD-10-CM | POA: Diagnosis not present

## 2016-10-09 DIAGNOSIS — S300XXA Contusion of lower back and pelvis, initial encounter: Secondary | ICD-10-CM | POA: Diagnosis not present

## 2016-10-09 NOTE — ED Provider Notes (Signed)
[facility] Emergency Department Provider Note  Time seen: 12:08 PM  I have reviewed the triage vital signs and the nursing notes.   HISTORY  Chief Complaint Fall    HPI Olivia Lewis is a 82 y.o. female Presents to the emergency department after a fall. According to the patient she went to sit down on the toilet and fell off the left side. Patient states mild left hip pain, did hit the left side of her head but denies LOC or vomiting. Patient was able to ambulate after the fall.  Past Medical History:  Diagnosis Date  . Allergy   . Anemia   . Arthritis   . Cancer ([place])    skin  . Chronic kidney disease    elevated creatine  . Diverticulosis   . Hyperlipidemia   . Hypertension   . Hypothyroidism     Patient Active Problem List   Diagnosis Date Noted  . B12 deficiency 04/07/2016  . Acid reflux 04/07/2016  . HLD (hyperlipidemia) 04/07/2016  . BP (high blood pressure) 04/07/2016  . Arthritis, degenerative 04/07/2016  . OP (osteoporosis) 04/07/2016  . Primary osteoarthritis of knee 05/06/2015  . Acquired hypothyroidism 02/25/2015  . Chronic kidney disease (CKD), stage III (moderate) 02/25/2015  . Calcium blood increased 02/25/2015  . Baker's cyst of knee 05/30/2014    Past Surgical History:  Procedure Laterality Date  . BUNIONECTOMY Bilateral   . CARDIAC CATHETERIZATION    . DILATION AND CURETTAGE OF UTERUS    . EYE SURGERY Bilateral    cataract  surgery  . JOINT REPLACEMENT Right    total knee replacement  . meniscus tear Right   . TOTAL KNEE ARTHROPLASTY Left 05/06/2015   Procedure: TOTAL KNEE ARTHROPLASTY;  Surgeon: Hessie Knows, MD;  Location: [facility] ORS;  Service: Orthopedics;  Laterality: Left;    Prior to Admission medications   Medication Sig Start Date End Date Taking? Authorizing Provider  acetaminophen (TYLENOL) 500 MG tablet Take 1,000 mg by mouth daily as needed.     Historical Provider, MD  amLODipine-benazepril (LOTREL)  5-40 MG per capsule Take 1 capsule by mouth daily.  04/11/14   Historical Provider, MD  aspirin EC 81 MG tablet Take 81 mg by mouth daily.     Historical Provider, MD  atorvastatin (LIPITOR) 10 MG tablet Take 10 mg by mouth 1 day or 1 dose.    Historical Provider, MD  Calcium Carb-Cholecalciferol (EQL OYSTER SHELL CALCIUM/D) 500-200 MG-UNIT TABS Take by mouth.    Historical Provider, MD  Cobalamine Combinations (B-12) [phone] MCG SUBL Place under the tongue daily.     Historical Provider, MD  docusate sodium (STOOL SOFTENER) 100 MG capsule Take 100 mg by mouth daily as needed.     Historical Provider, MD  hydrochlorothiazide (HYDRODIURIL) 12.5 MG tablet Take 12.5 mg by mouth 1 day or 1 dose.    Historical Provider, MD  levothyroxine (SYNTHROID, LEVOTHROID) 50 MCG tablet Take 50 mcg by mouth daily at 6 (six) AM.    Historical Provider, MD  loratadine (CLARITIN) 10 MG tablet Take 10 mg by mouth daily as needed for allergies.     Historical Provider, MD  metoprolol succinate (TOPROL-XL) 50 MG 24 hr tablet Take 50 mg by mouth 1 day or 1 dose. 04/11/14   Historical Provider, MD  Misc Natural Products (OSTEO BI-FLEX/5-LOXIN ADVANCED) TABS Take 1 tablet by mouth daily.     Historical Provider, MD  Omega-3 Fatty Acids (SM FISH OIL) 1000 MG  CAPS Take 1 capsule by mouth daily.     Historical Provider, MD    Allergies  Allergen Reactions  . Penicillin V Potassium Other (See Comments)    headache  . Sulfa Antibiotics Other (See Comments)    headache    Family History  Problem Relation Age of Onset  . Hypertension Mother   . Hypertension Father   . Diabetes Sister   . Breast cancer Sister     Social History Social History  Substance Use Topics  . Smoking status: Never Smoker  . Smokeless tobacco: Never Used  . Alcohol use No    Review of Systems Constitutional: Negative for fever Cardiovascular: Negative for chest pain. Respiratory: Negative for shortness of breath. Gastrointestinal:  Negative for abdominal pain Musculoskeletal: egative for neck or back pain. Positive for mild left hip pain. Skin: Negative for rash. Neurological: mild headache. Denies focal weakness or numbness. 10-point ROS otherwise negative.  ____________________________________________   PHYSICAL EXAM:  VITAL SIGNS: ED Triage Vitals  Enc Vitals Group     BP 10/09/16 1001 (!) 124/57     Pulse Rate 10/09/16 1001 68     Resp 10/09/16 1001 16     Temp 10/09/16 1001 97.7 F (36.5 C)     Temp Source 10/09/16 1001 Oral     SpO2 10/09/16 1001 96 %     Weight 10/09/16 0959 195 lb (88.5 kg)     Height 10/09/16 0959 5' (1.524 m)     Head Circumference --      Peak Flow --      Pain Score 10/09/16 0959 1     Pain Loc --      Pain Edu? --      Excl. in [place]? --     Constitutional: Alert and oriented. Well appearing and in no distress. Eyes: Normal exam ENT   Head: small hematoma to left parietal scalp.   Mouth/Throat: Mucous membranes are moist. Cardiovascular: Normal rate, regular rhythm. No murmur Respiratory: Normal respiratory effort without tachypnea nor retractions. Breath sounds are clear  Gastrointestinal: Soft and nontender. No distention.  Musculoskeletal: mild left hip tenderness palpation. Good range of motion without pain. Neurologic:  Normal speech and language. No gross focal neurologic deficits  Skin:  Skin is warm, dry and intact.  Psychiatric: Mood and affect are normal.   ____________________________________________   RADIOLOGY  left hip x-ray negative. CT head negative  ____________________________________________   INITIAL IMPRESSION / ASSESSMENT AND PLAN / ED COURSE  Pertinent labs & imaging results that were available during my care of the patient were reviewed by me and considered in my medical decision making (see chart for details).  imaging is negative. Overall the patient appears well, no distress, alert and oriented. I suspect the patient likely  has a left hip contusion. CT of the head is negative. Patient does have a small left parietal scalp hematoma. Patient will be discharged home with PCP follow  ____________________________________________   FINAL CLINICAL IMPRESSION(S) / ED DIAGNOSES  fall Contusion    Harvest Dark, MD 10/09/16 1210

## 2016-10-09 NOTE — ED Triage Notes (Signed)
Patient from home via POV with sister. Patient was attempting to lower herself onto the toilet when she "leaned too far" and lost her footing. Patient struck the left side of her head on the ceramic shower. Patient states "it wasn't hard, but I have a knot". Patient also complaining of left hip pain. However, Patient ambulatory without difficulty. Denies LOC, Denies Anticoagulant therapy.

## 2016-10-11 NOTE — Telephone Encounter (Signed)
thanks

## 2016-10-27 ENCOUNTER — Ambulatory Visit (INDEPENDENT_AMBULATORY_CARE_PROVIDER_SITE_OTHER): Payer: Medicare Other | Admitting: Podiatry

## 2016-10-27 DIAGNOSIS — M722 Plantar fascial fibromatosis: Secondary | ICD-10-CM

## 2016-10-27 NOTE — Progress Notes (Signed)
Casted for orthotics today.

## 2016-11-24 ENCOUNTER — Ambulatory Visit (INDEPENDENT_AMBULATORY_CARE_PROVIDER_SITE_OTHER): Payer: Self-pay | Admitting: *Deleted

## 2016-11-24 DIAGNOSIS — M722 Plantar fascial fibromatosis: Secondary | ICD-10-CM

## 2016-11-30 NOTE — Progress Notes (Signed)
Dispensed patient's orthotics with oral and written instructions for wearing. Patient will follow up with Dr. Hyatt in 1 month for an orthotic check. 

## 2017-01-05 ENCOUNTER — Ambulatory Visit: Payer: Medicare Other | Admitting: Podiatry

## 2017-08-17 ENCOUNTER — Ambulatory Visit (INDEPENDENT_AMBULATORY_CARE_PROVIDER_SITE_OTHER): Payer: Medicare Other

## 2017-08-17 ENCOUNTER — Ambulatory Visit (INDEPENDENT_AMBULATORY_CARE_PROVIDER_SITE_OTHER): Payer: Medicare Other | Admitting: Podiatry

## 2017-08-17 DIAGNOSIS — M2042 Other hammer toe(s) (acquired), left foot: Secondary | ICD-10-CM | POA: Diagnosis not present

## 2017-08-17 DIAGNOSIS — M722 Plantar fascial fibromatosis: Secondary | ICD-10-CM

## 2017-08-17 NOTE — Progress Notes (Signed)
She presents today with a chief complaint painful bunion and hammertoe second left. She states that been gradually getting worse over the past several months and is starting to hurt with certain shoes. She states that it's she is use corn pads with some relief.  Objective: Vital signs are stable she is alert and oriented 3. Pulses are palpable. Neurologic sensorium is intact. Deep tendon reflexes are intact. Muscle strength normal symmetrical. Moderate to severe hallux abductovalgus deformity with overlapping second toe left.  Assessment: Hallux abductovalgus deformity overlapping second toe.  Plan: Discussed surgical intervention today consisting of agitation of the toe. She would like to consider this. In the meantime I provided her with padding for her toe. I will follow-up with her as needed for consult.

## 2018-02-12 ENCOUNTER — Encounter: Payer: Self-pay | Admitting: Emergency Medicine

## 2018-02-12 ENCOUNTER — Emergency Department: Payer: Medicare Other

## 2018-02-12 ENCOUNTER — Emergency Department
Admission: EM | Admit: 2018-02-12 | Discharge: 2018-02-12 | Disposition: A | Discharge status: Home / Self Care | Payer: Medicare Other | Attending: Emergency Medicine | Admitting: Emergency Medicine

## 2018-02-12 DIAGNOSIS — E860 Dehydration: Secondary | ICD-10-CM

## 2018-02-12 DIAGNOSIS — R42 Dizziness and giddiness: Secondary | ICD-10-CM | POA: Diagnosis present

## 2018-02-12 DIAGNOSIS — Z79899 Other long term (current) drug therapy: Secondary | ICD-10-CM | POA: Insufficient documentation

## 2018-02-12 DIAGNOSIS — I129 Hypertensive chronic kidney disease with stage 1 through stage 4 chronic kidney disease, or unspecified chronic kidney disease: Secondary | ICD-10-CM | POA: Diagnosis not present

## 2018-02-12 DIAGNOSIS — Z7982 Long term (current) use of aspirin: Secondary | ICD-10-CM | POA: Insufficient documentation

## 2018-02-12 DIAGNOSIS — E039 Hypothyroidism, unspecified: Secondary | ICD-10-CM | POA: Insufficient documentation

## 2018-02-12 DIAGNOSIS — N183 Chronic kidney disease, stage 3 (moderate): Secondary | ICD-10-CM | POA: Diagnosis not present

## 2018-02-12 LAB — URINALYSIS, COMPLETE (UACMP) WITH MICROSCOPIC
BILIRUBIN URINE: NEGATIVE
Bacteria, UA: NONE SEEN
GLUCOSE, UA: NEGATIVE mg/dL
Hgb urine dipstick: NEGATIVE
Ketones, ur: NEGATIVE mg/dL
Leukocytes, UA: NEGATIVE
Nitrite: NEGATIVE
PH: 5 (ref 5.0–8.0)
Protein, ur: 30 mg/dL — AB
Specific Gravity, Urine: 1.021 (ref 1.005–1.030)

## 2018-02-12 LAB — BASIC METABOLIC PANEL
Anion gap: 12 (ref 5–15)
BUN: 32 mg/dL — ABNORMAL HIGH (ref 6–20)
CO2: 23 mmol/L (ref 22–32)
Calcium: 9.6 mg/dL (ref 8.9–10.3)
Chloride: 104 mmol/L (ref 101–111)
Creatinine, Ser: 1.34 mg/dL — ABNORMAL HIGH (ref 0.44–1.00)
GFR, EST AFRICAN AMERICAN: 40 mL/min — AB (ref >60)
GFR, EST NON AFRICAN AMERICAN: 35 mL/min — AB (ref >60)
Glucose, Bld: 123 mg/dL — ABNORMAL HIGH (ref 65–99)
Potassium: 4.1 mmol/L (ref 3.5–5.1)
SODIUM: 139 mmol/L (ref 135–145)

## 2018-02-12 LAB — CBC
HCT: 41.9 % (ref 35.0–47.0)
Hemoglobin: 13.7 g/dL (ref 12.0–16.0)
MCH: 28.8 pg (ref 26.0–34.0)
MCHC: 32.7 g/dL (ref 32.0–36.0)
MCV: 88.1 fL (ref 80.0–100.0)
Platelets: 277 10*3/uL (ref 150–440)
RBC: 4.76 MIL/uL (ref 3.80–5.20)
RDW: 14.4 % (ref 11.5–14.5)
WBC: 8.4 10*3/uL (ref 3.6–11.0)

## 2018-02-12 LAB — TROPONIN I: Troponin I: <0.03 ng/mL (ref <0.03)

## 2018-02-12 MED ORDER — SODIUM CHLORIDE 0.9 % IV BOLUS (SEPSIS)
500.0000 mL | Freq: Once | INTRAVENOUS | Status: AC
  Administered 2018-02-12: 500 mL via INTRAVENOUS

## 2018-02-12 NOTE — ED Notes (Signed)
Patient to lobby in wheelchair with NAD noted. Verbalized understanding of discharge instructions and follow-up care.   

## 2018-02-12 NOTE — ED Provider Notes (Signed)
[facility] Emergency Department Provider Note   ____________________________________________   First MD Initiated Contact with Patient 02/12/18 [phone]     (approximate)  I have reviewed the triage vital signs and the nursing notes.   HISTORY  Chief Complaint Weakness   HPI Olivia Lewis is a 84 y.o. female previous history of anemia, chronic kidney disease, hypertension hypothyroidism  Patient got up this morning, went to church.  She sat for about 1 hour, after church service addended she stood from her chair and reports that she then began feeling very lightheaded.  She felt as though she was off balance, she needed assistance by 2 people to get her to her car.  She was unable to drive home, and reports she started to feel better but had a neighbor check her blood pressure and they told her her blood pressure is 190, she reports she became very nervous about this high blood pressure and came to the ER for further evaluation.  Right now she reports she feels fine.  She denies any ongoing symptoms.  No headache.  No neck pain.  She does have mild chronic pain in her lower back, but reports this is chronic and has been with her for years without change.  No weakness in arm or leg.  There is no facial droop.  No trouble speaking.  No numbness or tingling anywhere in the body during the episode.  She is not sure exactly what happened, but reports she is felt lightheaded and persisted for several minutes after getting up from church.  She does note that she did not drink anything before church today, and had just a yogurt for breakfast reports that not too unusual for her.  Past Medical History:  Diagnosis Date  . Allergy   . Anemia   . Arthritis   . Cancer ([place])    skin  . Chronic kidney disease    elevated creatine  . Diverticulosis   . Hyperlipidemia   . Hypertension   . Hypothyroidism     Patient Active Problem List   Diagnosis Date Noted  . B12  deficiency 04/07/2016  . Acid reflux 04/07/2016  . HLD (hyperlipidemia) 04/07/2016  . BP (high blood pressure) 04/07/2016  . Arthritis, degenerative 04/07/2016  . OP (osteoporosis) 04/07/2016  . Primary osteoarthritis of knee 05/06/2015  . Acquired hypothyroidism 02/25/2015  . Chronic kidney disease (CKD), stage III (moderate) ([place]) 02/25/2015  . Calcium blood increased 02/25/2015  . Baker's cyst of knee 05/30/2014    Past Surgical History:  Procedure Laterality Date  . BUNIONECTOMY Bilateral   . CARDIAC CATHETERIZATION    . DILATION AND CURETTAGE OF UTERUS    . EYE SURGERY Bilateral    cataract  surgery  . JOINT REPLACEMENT Right    total knee replacement  . meniscus tear Right   . TOTAL KNEE ARTHROPLASTY Left 05/06/2015   Procedure: TOTAL KNEE ARTHROPLASTY;  Surgeon: Hessie Knows, MD;  Location: [facility] ORS;  Service: Orthopedics;  Laterality: Left;    Prior to Admission medications   Medication Sig Start Date End Date Taking? Authorizing Provider  acetaminophen (TYLENOL) 500 MG tablet Take 1,000 mg by mouth daily as needed.    Yes [provider]  amLODipine-benazepril (LOTREL) 5-40 MG per capsule Take 1 capsule by mouth daily.  04/11/14  Yes [provider]  aspirin EC 81 MG tablet Take 81 mg by mouth daily.    Yes [provider]  atorvastatin (LIPITOR) 10 MG  tablet Take 10 mg by mouth daily.    Yes [provider]  Calcium Carb-Cholecalciferol (EQL OYSTER SHELL CALCIUM/D) 500-200 MG-UNIT TABS Take 1 tablet by mouth 2 (two) times daily.    Yes [provider]  Cyanocobalamin (B-12 SL) Place 2,500 mcg under the tongue daily.    Yes [provider]  docusate sodium (STOOL SOFTENER) 100 MG capsule Take 100 mg by mouth daily as needed.    Yes [provider]  levothyroxine (SYNTHROID, LEVOTHROID) 50 MCG tablet Take 50 mcg by mouth daily at 6 (six) AM.   Yes [provider]  loratadine (CLARITIN) 10 MG tablet Take  10 mg by mouth daily as needed for allergies.    Yes [provider]  metoprolol succinate (TOPROL-XL) 50 MG 24 hr tablet Take 50 mg by mouth daily.    Yes [provider]  Omega-3 Fatty Acids (SM FISH OIL) 1000 MG CAPS Take 1 capsule by mouth daily.    Yes [provider]    Allergies Penicillin v potassium and Sulfa antibiotics  Family History  Problem Relation Age of Onset  . Hypertension Mother   . Hypertension Father   . Diabetes Sister   . Breast cancer Sister     Social History Social History   Tobacco Use  . Smoking status: Never Smoker  . Smokeless tobacco: Never Used  Substance Use Topics  . Alcohol use: No  . Drug use: No    Review of Systems Constitutional: No fever/chills Eyes: No visual changes. ENT: No sore throat. Cardiovascular: Denies chest pain. Respiratory: Denies shortness of breath. Gastrointestinal: No abdominal pain.  No nausea, no vomiting.  No diarrhea.  No constipation. Genitourinary: Negative for dysuria. Musculoskeletal: See HPI Skin: Negative for rash. Neurological: Negative for headaches, focal weakness or numbness.  See HPI.    ____________________________________________   PHYSICAL EXAM:  VITAL SIGNS: ED Triage Vitals  Enc Vitals Group     BP 02/12/18 1443 (!) 144/78     Pulse Rate 02/12/18 1443 71     Resp 02/12/18 1443 18     Temp 02/12/18 1443 98.2 F (36.8 C)     Temp Source 02/12/18 1443 Oral     SpO2 02/12/18 1443 95 %     Weight 02/12/18 1453 198 lb (89.8 kg)     Height 02/12/18 1453 5' (1.524 m)     Head Circumference --      Peak Flow --      Pain Score 02/12/18 1453 0     Pain Loc --      Pain Edu? --      Excl. in [place]? --     Constitutional: Alert and oriented. Well appearing and in no acute distress.  Patient and her sister both very pleasant. Eyes: Conjunctivae are normal. Head: Atraumatic. Nose: No congestion/rhinnorhea. Mouth/Throat: Mucous membranes are slightly dry. Neck:  No stridor.   Cardiovascular: Normal rate, regular rhythm. Grossly normal heart sounds.  Good peripheral circulation. Respiratory: Normal respiratory effort.  No retractions. Lungs CTAB. Gastrointestinal: Soft and nontender. No distention. Musculoskeletal: No lower extremity tenderness nor edema. Neurologic:  Normal speech and language. No gross focal neurologic deficits are appreciated.  Skin:  Skin is warm, dry and intact. No rash noted. Psychiatric: Mood and affect are normal. Speech and behavior are normal.  ____________________________________________   LABS (all labs ordered are listed, but only abnormal results are displayed)  Labs Reviewed  BASIC METABOLIC PANEL - Abnormal; Notable for the following  components:      Result Value   Glucose, Bld 123 (*)    BUN 32 (*)    Creatinine, Ser 1.34 (*)    GFR calc non Af Amer 35 (*)    GFR calc Af Amer 40 (*)    All other components within normal limits  URINALYSIS, COMPLETE (UACMP) WITH MICROSCOPIC - Abnormal; Notable for the following components:   Color, Urine YELLOW (*)    APPearance HAZY (*)    Protein, ur 30 (*)    Squamous Epithelial / LPF 6-30 (*)    All other components within normal limits  CBC  TROPONIN I   ____________________________________________  EKG  Reviewed and interpreted by me at 1630 Heart rate 60 QRS 60 QTC 400 Normal sinus rhythm, probable LVH, no evidence of acute ischemia. ____________________________________________  RADIOLOGY    CT reviewed, no acute intracranial findings ____________________________________________   PROCEDURES  Procedure(s) performed: None  Procedures  Critical Care performed: No  ____________________________________________   INITIAL IMPRESSION / ASSESSMENT AND PLAN / ED COURSE  Pertinent labs & imaging results that were available during my care of the patient were reviewed by me and considered in my medical decision making (see chart for  details).  Patient presents for evaluation of episode of feeling weak or dizzy or having some difficulty walking which occurred just after standing up a church service.  Lasted a few minutes and then she was able to drive herself home, now asymptomatic.  Very reassuring neurologic exam without any focality.  She has no signs or symptoms suggest acute stroke or central neurologic etiology, but given her age I will obtain a CT scan to evaluate and assure on normal general appearance of the brain and no evidence of hemorrhage.  No nausea vomiting no chest pain or trouble breathing.  I suspect based on the history given an exam that she may have been just slightly dehydrated from her baseline, possibly due to not having much to drink or eat this morning before church and became orthostatic after standing up.  She is now in normal state of health, with no evidence of acute abnormality by my exam though labs to reflect what appears to be a mild worsening of her chronic kidney disease, now provide IV hydration.     ----------------------------------------- 5:59 PM on 02/12/2018 -----------------------------------------  Patient resting comfortably.  Reports has been able to get up and walk without any difficulty.  Asymptomatic.  Feel likely some mild dehydration, no evidence of acute ongoing concern.  Discussed with the patient and her sister, we will discharge her to home with careful return precautions and recommendations of follow-up with primary care doctor in the next couple of days.  Return precautions and treatment recommendations and follow-up discussed with the patient who is agreeable with the plan.  ____________________________________________   FINAL CLINICAL IMPRESSION(S) / ED DIAGNOSES  Final diagnoses:  Mild dehydration  Dizziness      NEW MEDICATIONS STARTED DURING THIS VISIT:  New Prescriptions   No medications on file     Note:  This document was prepared using Dragon  voice recognition software and may include unintentional dictation errors.     Delman Kitten, MD 02/12/18 1800

## 2018-02-12 NOTE — ED Triage Notes (Signed)
Patient presents to the ED stating that when she stood up to leave church this morning she did not feel steady on her feet.  Others had to help her walk which is unusual for her.  Patient states, "I drove home from church with someone following me."  Patient states her neighbor took her blood pressure and is was 190/90.  Patient states, "I just wanted to get checked out."  Patient states, "I don't feel dizzy, things just weren't working quite right."  Patient denies unilateral weakness.  Patient is alert and oriented x 4 with clear speech.

## 2018-02-12 NOTE — ED Notes (Signed)
Patient ambulatory to restroom without assistance. Reports improvement in dizziness and weakness. Patient repositioned in bed and attached to monitor. Call light in reach.

## 2018-02-22 ENCOUNTER — Other Ambulatory Visit: Payer: Self-pay | Admitting: Physical Medicine and Rehabilitation

## 2018-02-22 DIAGNOSIS — M5136 Other intervertebral disc degeneration, lumbar region: Secondary | ICD-10-CM

## 2018-03-02 ENCOUNTER — Ambulatory Visit
Admission: RE | Admit: 2018-03-02 | Discharge: 2018-03-02 | Disposition: A | Discharge status: Home / Self Care | Payer: Medicare Other | Source: Ambulatory Visit | Attending: Physical Medicine and Rehabilitation | Admitting: Physical Medicine and Rehabilitation

## 2018-03-02 DIAGNOSIS — M48061 Spinal stenosis, lumbar region without neurogenic claudication: Secondary | ICD-10-CM | POA: Insufficient documentation

## 2018-03-02 DIAGNOSIS — M4807 Spinal stenosis, lumbosacral region: Secondary | ICD-10-CM | POA: Diagnosis not present

## 2018-03-02 DIAGNOSIS — R600 Localized edema: Secondary | ICD-10-CM | POA: Insufficient documentation

## 2018-03-02 DIAGNOSIS — M5136 Other intervertebral disc degeneration, lumbar region: Secondary | ICD-10-CM | POA: Insufficient documentation

## 2018-03-02 DIAGNOSIS — M47896 Other spondylosis, lumbar region: Secondary | ICD-10-CM | POA: Diagnosis not present

## 2019-10-09 ENCOUNTER — Other Ambulatory Visit: Payer: Self-pay

## 2019-10-09 ENCOUNTER — Emergency Department: Payer: Medicare Other

## 2019-10-09 ENCOUNTER — Emergency Department
Admission: EM | Admit: 2019-10-09 | Discharge: 2019-10-09 | Disposition: A | Discharge status: Home / Self Care | Payer: Medicare Other | Attending: Emergency Medicine | Admitting: Emergency Medicine

## 2019-10-09 DIAGNOSIS — T148XXA Other injury of unspecified body region, initial encounter: Secondary | ICD-10-CM

## 2019-10-09 DIAGNOSIS — Y9301 Activity, walking, marching and hiking: Secondary | ICD-10-CM | POA: Diagnosis not present

## 2019-10-09 DIAGNOSIS — E039 Hypothyroidism, unspecified: Secondary | ICD-10-CM | POA: Diagnosis not present

## 2019-10-09 DIAGNOSIS — Y9289 Other specified places as the place of occurrence of the external cause: Secondary | ICD-10-CM | POA: Diagnosis not present

## 2019-10-09 DIAGNOSIS — Z96652 Presence of left artificial knee joint: Secondary | ICD-10-CM | POA: Diagnosis not present

## 2019-10-09 DIAGNOSIS — W010XXA Fall on same level from slipping, tripping and stumbling without subsequent striking against object, initial encounter: Secondary | ICD-10-CM | POA: Insufficient documentation

## 2019-10-09 DIAGNOSIS — S0990XA Unspecified injury of head, initial encounter: Secondary | ICD-10-CM | POA: Insufficient documentation

## 2019-10-09 DIAGNOSIS — W19XXXA Unspecified fall, initial encounter: Secondary | ICD-10-CM

## 2019-10-09 DIAGNOSIS — S8001XA Contusion of right knee, initial encounter: Secondary | ICD-10-CM | POA: Diagnosis not present

## 2019-10-09 DIAGNOSIS — N183 Chronic kidney disease, stage 3 unspecified: Secondary | ICD-10-CM | POA: Diagnosis not present

## 2019-10-09 DIAGNOSIS — Y999 Unspecified external cause status: Secondary | ICD-10-CM | POA: Diagnosis not present

## 2019-10-09 DIAGNOSIS — I129 Hypertensive chronic kidney disease with stage 1 through stage 4 chronic kidney disease, or unspecified chronic kidney disease: Secondary | ICD-10-CM | POA: Insufficient documentation

## 2019-10-09 DIAGNOSIS — S0081XA Abrasion of other part of head, initial encounter: Secondary | ICD-10-CM | POA: Insufficient documentation

## 2019-10-09 MED ORDER — ACETAMINOPHEN 500 MG PO TABS
1000.0000 mg | ORAL_TABLET | Freq: Once | ORAL | Status: AC
  Administered 2019-10-09: 16:00:00 1000 mg via ORAL
  Filled 2019-10-09: qty 2

## 2019-10-09 NOTE — ED Notes (Signed)
Esign not working at this time. Pt verbalized discharge instructions and has no questions at this time. 

## 2019-10-09 NOTE — ED Provider Notes (Addendum)
[facility] Emergency Department Provider Note       Time seen: ----------------------------------------- 2:15 PM on 10/09/2019 -----------------------------------------   I have reviewed the triage vital signs and the nursing notes.  HISTORY   Chief Complaint Fall    HPI Olivia Lewis is a 85 y.o. female with a history of allergies, anemia, arthritis, skin cancer, chronic kidney disease, hyperlipidemia, hypertension who presents to the ED for a fall.  Patient was walking outside of the [employer].  She is tripped and fell.  She was not dizzy prior to falling.  She does take a baby aspirin but no other blood thinners.  She sustained abrasions to her face and head, complains of right knee pain as well.  Past Medical History:  Diagnosis Date  . Allergy   . Anemia   . Arthritis   . Cancer ([place])    skin  . Chronic kidney disease    elevated creatine  . Diverticulosis   . Hyperlipidemia   . Hypertension   . Hypothyroidism     Patient Active Problem List   Diagnosis Date Noted  . B12 deficiency 04/07/2016  . Acid reflux 04/07/2016  . HLD (hyperlipidemia) 04/07/2016  . BP (high blood pressure) 04/07/2016  . Arthritis, degenerative 04/07/2016  . OP (osteoporosis) 04/07/2016  . Primary osteoarthritis of knee 05/06/2015  . Acquired hypothyroidism 02/25/2015  . Chronic kidney disease (CKD), stage III (moderate) 02/25/2015  . Calcium blood increased 02/25/2015  . Baker's cyst of knee 05/30/2014    Past Surgical History:  Procedure Laterality Date  . BUNIONECTOMY Bilateral   . CARDIAC CATHETERIZATION    . DILATION AND CURETTAGE OF UTERUS    . EYE SURGERY Bilateral    cataract  surgery  . JOINT REPLACEMENT Right    total knee replacement  . meniscus tear Right   . TOTAL KNEE ARTHROPLASTY Left 05/06/2015   Procedure: TOTAL KNEE ARTHROPLASTY;  Surgeon: Hessie Knows, MD;  Location: [facility] ORS;  Service: Orthopedics;  Laterality: Left;     Allergies Penicillin v potassium and Sulfa antibiotics  Social History Social History   Tobacco Use  . Smoking status: Never Smoker  . Smokeless tobacco: Never Used  Substance Use Topics  . Alcohol use: No  . Drug use: No   Review of Systems Constitutional: Negative for fever. Cardiovascular: Negative for chest pain. Respiratory: Negative for shortness of breath. Gastrointestinal: Negative for abdominal pain, vomiting and diarrhea. Musculoskeletal: Negative for back pain. Skin: Positive for skin tears to the face Neurological: Positive for headache  All systems negative/normal/unremarkable except as stated in the HPI  ____________________________________________   PHYSICAL EXAM:  VITAL SIGNS: ED Triage Vitals [10/09/19 1413]  Enc Vitals Group     BP      Pulse      Resp      Temp      Temp src      SpO2      Weight 189 lb (85.7 kg)     Height 5' (1.524 m)     Head Circumference      Peak Flow      Pain Score 4     Pain Loc      Pain Edu?      Excl. in [place]?    Constitutional: Alert and oriented. Well appearing and in no distress. Eyes: Conjunctivae are normal. Normal extraocular movements. ENT      Head: Normocephalic, extensive abrasions that extend from her upper lip on the right side,  across the bridge of her nose and most of her forehead in the frontal scalp      Nose: No congestion/rhinnorhea.      Mouth/Throat: Mucous membranes are moist.  No loose teeth      Neck: No stridor. Cardiovascular: Normal rate, regular rhythm. No murmurs, rubs, or gallops. Respiratory: Normal respiratory effort without tachypnea nor retractions. Breath sounds are clear and equal bilaterally. No wheezes/rales/rhonchi. Gastrointestinal: Soft and nontender. Normal bowel sounds Musculoskeletal: Mild pain with range of motion of the right knee, right knee ecchymosis is noted Neurologic:  Normal speech and language. No gross focal neurologic deficits are appreciated.  Skin:  Extensive facial abrasion, right knee contusion Psychiatric: Mood and affect are normal. Speech and behavior are normal.  ____________________________________________  ED COURSE:  As part of my medical decision making, I reviewed the following data within the [place] History obtained from family if available, nursing notes, old chart and ekg, as well as notes from prior ED visits. Patient presented for a mechanical fall, we will assess with imaging as indicated at this time.   Procedures  Arwyn A Weaber was evaluated in Emergency Department on 10/09/2019 for the symptoms described in the history of present illness. She was evaluated in the context of the global COVID-19 pandemic, which necessitated consideration that the patient might be at risk for infection with the SARS-CoV-2 virus that causes COVID-19. Institutional protocols and algorithms that pertain to the evaluation of patients at risk for COVID-19 are in a state of rapid change based on information released by regulatory bodies including the CDC and federal and state organizations. These policies and algorithms were followed during the patient's care in the ED.  ____________________________________________   RADIOLOGY Images were viewed by me  CT head, maxillofacial IMPRESSION:  1. Frontal and glabellar scalp swelling slightly eccentric to the  right with small lentiform scalp hematoma measuring up to 4 mm in  maximal thickness. No subjacent calvarial or facial bone fracture.  2. Additional mild soft tissue swelling of the nasal bridge and  upper lip.  3. No acute intracranial abnormality.  4. Chronic microvascular angiopathy changes and parenchymal volume  loss.  5. Moderate bilateral TMJ arthrosis.  Right knee x-ray IMPRESSION:  1. Prepatellar soft tissue swelling.  2. No acute osseous abnormality of the right knee.  ____________________________________________   DIFFERENTIAL DIAGNOSIS   Fall,  contusion, abrasion, laceration, subdural, fracture  FINAL ASSESSMENT AND PLAN  Fall, facial abrasion, minor head injury, contusion   Plan: The patient had presented for a mechanical fall. Patient's imaging not reveal any acute process.  She did not require any laceration repairs.  We placed an ice pack to her wounds and gave her Tylenol for pain.  She is cleared for outpatient follow-up.   Laurence Aly, MD    Note: This note was generated in part or whole with voice recognition software. Voice recognition is usually quite accurate but there are transcription errors that can and very often do occur. I apologize for any typographical errors that were not detected and corrected.     Earleen Newport, MD 10/09/19 1531    Earleen Newport, MD 10/09/19 [phone]

## 2019-10-09 NOTE — ED Triage Notes (Addendum)
Pt arrives via [facility] for a fall outside of [employer], pt states she tripped walking in the parking lot and had no LOC. States she was not dizzy prior to falling. Pt takes ASA but no other blood thinners. Right knee is bruised and swollen. Pt has skin tear to middle of forehead and bridge of nose. PT A&Ox4 and in NAD

## 2019-10-09 NOTE — ED Notes (Signed)
Dr Williams at bedside 

## 2019-10-09 NOTE — ED Notes (Signed)
Report given to Amy, RN.

## 2020-10-01 ENCOUNTER — Encounter (INDEPENDENT_AMBULATORY_CARE_PROVIDER_SITE_OTHER): Payer: Self-pay

## 2020-10-01 ENCOUNTER — Inpatient Hospital Stay: Payer: Medicare Other

## 2020-10-01 ENCOUNTER — Inpatient Hospital Stay: Payer: Medicare Other | Attending: Oncology | Admitting: Oncology

## 2020-10-01 ENCOUNTER — Encounter: Payer: Self-pay | Admitting: Oncology

## 2020-10-01 VITALS — BP 133/78 | HR 63 | Temp 97.3°F | Resp 16 | Wt 169.3 lb

## 2020-10-01 DIAGNOSIS — I10 Essential (primary) hypertension: Secondary | ICD-10-CM | POA: Insufficient documentation

## 2020-10-01 DIAGNOSIS — M129 Arthropathy, unspecified: Secondary | ICD-10-CM | POA: Insufficient documentation

## 2020-10-01 DIAGNOSIS — D721 Eosinophilia, unspecified: Secondary | ICD-10-CM | POA: Insufficient documentation

## 2020-10-01 DIAGNOSIS — E039 Hypothyroidism, unspecified: Secondary | ICD-10-CM | POA: Diagnosis not present

## 2020-10-01 DIAGNOSIS — D649 Anemia, unspecified: Secondary | ICD-10-CM | POA: Insufficient documentation

## 2020-10-01 DIAGNOSIS — R944 Abnormal results of kidney function studies: Secondary | ICD-10-CM | POA: Insufficient documentation

## 2020-10-01 DIAGNOSIS — E538 Deficiency of other specified B group vitamins: Secondary | ICD-10-CM | POA: Diagnosis not present

## 2020-10-01 DIAGNOSIS — Z79899 Other long term (current) drug therapy: Secondary | ICD-10-CM | POA: Diagnosis not present

## 2020-10-01 DIAGNOSIS — N189 Chronic kidney disease, unspecified: Secondary | ICD-10-CM | POA: Diagnosis not present

## 2020-10-01 DIAGNOSIS — E785 Hyperlipidemia, unspecified: Secondary | ICD-10-CM | POA: Diagnosis not present

## 2020-10-01 DIAGNOSIS — Z7982 Long term (current) use of aspirin: Secondary | ICD-10-CM | POA: Insufficient documentation

## 2020-10-01 DIAGNOSIS — K219 Gastro-esophageal reflux disease without esophagitis: Secondary | ICD-10-CM | POA: Diagnosis not present

## 2020-10-01 DIAGNOSIS — D72829 Elevated white blood cell count, unspecified: Secondary | ICD-10-CM

## 2020-10-01 DIAGNOSIS — D72819 Decreased white blood cell count, unspecified: Secondary | ICD-10-CM | POA: Insufficient documentation

## 2020-10-01 DIAGNOSIS — D72821 Monocytosis (symptomatic): Secondary | ICD-10-CM | POA: Diagnosis not present

## 2020-10-01 LAB — HEPATITIS PANEL, ACUTE
HCV Ab: NONREACTIVE
Hep A IgM: NONREACTIVE
Hep B C IgM: NONREACTIVE
Hepatitis B Surface Ag: NONREACTIVE

## 2020-10-01 LAB — CBC WITH DIFFERENTIAL/PLATELET
Abs Immature Granulocytes: 0.08 10*3/uL — ABNORMAL HIGH (ref 0.00–0.07)
Basophils Absolute: 0.1 10*3/uL (ref 0.0–0.1)
Basophils Relative: 1 %
Eosinophils Absolute: 0.1 10*3/uL (ref 0.0–0.5)
Eosinophils Relative: 2 %
HCT: 40.1 % (ref 36.0–46.0)
Hemoglobin: 13.4 g/dL (ref 12.0–15.0)
Immature Granulocytes: 1 %
Lymphocytes Relative: 18 %
Lymphs Abs: 1.7 10*3/uL (ref 0.7–4.0)
MCH: 30.2 pg (ref 26.0–34.0)
MCHC: 33.4 g/dL (ref 30.0–36.0)
MCV: 90.5 fL (ref 80.0–100.0)
Monocytes Absolute: 1.1 10*3/uL — ABNORMAL HIGH (ref 0.1–1.0)
Monocytes Relative: 11 %
Neutro Abs: 6.3 10*3/uL (ref 1.7–7.7)
Neutrophils Relative %: 67 %
Platelets: 297 10*3/uL (ref 150–400)
RBC: 4.43 MIL/uL (ref 3.87–5.11)
RDW: 14.9 % (ref 11.5–15.5)
WBC: 9.3 10*3/uL (ref 4.0–10.5)
nRBC: 0 % (ref 0.0–0.2)

## 2020-10-01 LAB — HIV ANTIBODY (ROUTINE TESTING W REFLEX): HIV Screen 4th Generation wRfx: NONREACTIVE

## 2020-10-01 LAB — TECHNOLOGIST SMEAR REVIEW: Plt Morphology: NORMAL

## 2020-10-01 LAB — LACTATE DEHYDROGENASE: LDH: 180 U/L (ref 98–192)

## 2020-10-01 NOTE — Progress Notes (Signed)
New patient evaluation.   

## 2020-10-01 NOTE — Progress Notes (Signed)
Hematology/Oncology Consult note [facility] [phone] [phone]   Patient Care Team: Baxter Hire, MD as PCP - General (Internal Medicine)  REFERRING PROVIDER: Baxter Hire, MD  CHIEF COMPLAINTS/REASON FOR VISIT:  Evaluation of leukocytosis  HISTORY OF PRESENTING ILLNESS:  Olivia Lewis is a  86 y.o.  female with PMH listed below who was referred to me for evaluation of leukocytosis Reviewed patient' recent labs obtained by PCP.   09/12/2020 CBC showed elevated white count of 19.8.  Hemoglobin 13.7, smear showed smudge cells.  Predominantly neutrophilia, lymphocytosis, monocytosis Previous lab records reviewed.  05/14/2020, WBC 11.5.  Predominantly lymphocytosis, basophilia, eosinophilia 01/02/2020, WBC 11.6 predominantly basophilia 11/13/2019, WBC 9.9 04/26/2019, WBC 10.1 10/27/19 19 WBC 11.6 predominantly basophilia No aggravating or elevated factors. Associated symptoms or signs:  Denies weight loss, fever, chills,night sweats.  Fatigue Smoking history: Former smoker History of recent oral steroid use or steroid injection: Patient reports recent 2 courses of steroids which she finished shortly prior to 09/12/2020 blood work. History of recent infection: Denies Autoimmune disease history.  Denies  Patient walks independently with a cane.  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  No other close family members.  Her husband passed away few years ago.  Review of Systems  Constitutional: Negative for appetite change, chills, fatigue and fever.  HENT:   Negative for hearing loss and voice change.   Eyes: Negative for eye problems.  Respiratory: Negative for chest tightness and cough.   Cardiovascular: Negative for chest pain.  Gastrointestinal: Negative for abdominal distention, abdominal pain and blood in stool.  Endocrine: Negative for hot flashes.  Genitourinary:  Negative for difficulty urinating and frequency.   Musculoskeletal: Negative for arthralgias.  Skin: Negative for itching and rash.  Neurological: Negative for extremity weakness.  Hematological: Negative for adenopathy.  Psychiatric/Behavioral: Negative for confusion.     MEDICAL HISTORY:  Past Medical History:  Diagnosis Date  . Allergy   . Anemia   . Arthritis   . B12 deficiency   . Cancer ([place])    skin--face and arms  . Cataracts, bilateral   . Chickenpox   . Chronic kidney disease    elevated creatine  . Diverticulosis   . GERD (gastroesophageal reflux disease)   . Hyperlipidemia   . Hypertension   . Hypothyroidism   . Osteoporosis     SURGICAL HISTORY: Past Surgical History:  Procedure Laterality Date  . BUNIONECTOMY Bilateral   . CARDIAC CATHETERIZATION    . CATARACT EXTRACTION Bilateral   . DILATION AND CURETTAGE OF UTERUS    . EYE SURGERY Bilateral    cataract  surgery  . JOINT REPLACEMENT Right    total knee replacement  . meniscus tear Right   . multiple skin cancer removal    . NEUROMA SURGERY Bilateral    bilateral feet  . rotator cuff surgery Right   . TOTAL KNEE ARTHROPLASTY Left 05/06/2015   Procedure: TOTAL KNEE ARTHROPLASTY;  Surgeon: Hessie Knows, MD;  Location: [facility] ORS;  Service: Orthopedics;  Laterality: Left;    SOCIAL HISTORY: Social History   Socioeconomic History  . Marital status: Widowed    Spouse name: Not on file  . Number of children: Not on file  . Years of education: Not on file  . Highest education level: Not on file  Occupational History  . Not on file  Tobacco Use  . Smoking status: Never Smoker  .  Smokeless tobacco: Never Used  Vaping Use  . Vaping Use: Never used  Substance and Sexual Activity  . Alcohol use: No  . Drug use: No  . Sexual activity: Not Currently  Other Topics Concern  . Not on file  Social History Narrative  . Not on file   Social Determinants of Health   Financial Resource Strain:   .  Difficulty of Paying Living Expenses: Not on file  Food Insecurity:   . Worried About Charity fundraiser in the Last Year: Not on file  . Ran Out of Food in the Last Year: Not on file  Transportation Needs:   . Lack of Transportation (Medical): Not on file  . Lack of Transportation (Non-Medical): Not on file  Physical Activity:   . Days of Exercise per Week: Not on file  . Minutes of Exercise per Session: Not on file  Stress:   . Feeling of Stress : Not on file  Social Connections:   . Frequency of Communication with Friends and Family: Not on file  . Frequency of Social Gatherings with Friends and Family: Not on file  . Attends Religious Services: Not on file  . Active Member of Clubs or Organizations: Not on file  . Attends Archivist Meetings: Not on file  . Marital Status: Not on file  Intimate Partner Violence:   . Fear of Current or Ex-Partner: Not on file  . Emotionally Abused: Not on file  . Physically Abused: Not on file  . Sexually Abused: Not on file    FAMILY HISTORY: Family History  Problem Relation Age of Onset  . Hypertension Mother   . Hypertension Father   . Diabetes Sister   . Breast cancer Sister   . Colon cancer Sister   . Bone cancer Brother     ALLERGIES:  is allergic to penicillin v potassium and sulfa antibiotics.  MEDICATIONS:  Current Outpatient Medications  Medication Sig Dispense Refill  . acetaminophen (TYLENOL) 500 MG tablet Take 1,000 mg by mouth daily as needed.     Marland Kitchen amLODipine-benazepril (LOTREL) 5-40 MG per capsule Take 1 capsule by mouth daily.     Marland Kitchen aspirin EC 81 MG tablet Take 81 mg by mouth daily.     Marland Kitchen atorvastatin (LIPITOR) 10 MG tablet Take 10 mg by mouth daily.     . Calcium Carb-Cholecalciferol (EQL OYSTER SHELL CALCIUM/D) 500-200 MG-UNIT TABS Take 1 tablet by mouth 2 (two) times daily.     . Cyanocobalamin (B-12 SL) Place 2,500 mcg under the tongue daily.     Marland Kitchen docusate sodium (STOOL SOFTENER) 100 MG capsule Take  100 mg by mouth daily as needed.     Marland Kitchen levothyroxine (SYNTHROID, LEVOTHROID) 50 MCG tablet Take 50 mcg by mouth daily at 6 (six) AM.    . loratadine (CLARITIN) 10 MG tablet Take 10 mg by mouth daily as needed for allergies.     . metoprolol succinate (TOPROL-XL) 50 MG 24 hr tablet Take 50 mg by mouth daily.     . Omega-3 Fatty Acids (SM FISH OIL) 1000 MG CAPS Take 1 capsule by mouth daily.  (Patient not taking: Reported on 10/01/2020)     No current facility-administered medications for this visit.     PHYSICAL EXAMINATION: ECOG PERFORMANCE STATUS: 1 - Symptomatic but completely ambulatory Vitals:   10/01/20 1010  BP: 133/78  Pulse: 63  Resp: 16  Temp: (!) 97.3 F (36.3 C)   Filed Weights   10/01/20  1010  Weight: 169 lb 4.8 oz (76.8 kg)    Physical Exam Constitutional:      General: She is not in acute distress.    Comments: Patient walks with a cane  HENT:     Head: Normocephalic and atraumatic.  Eyes:     General: No scleral icterus. Cardiovascular:     Rate and Rhythm: Normal rate and regular rhythm.     Heart sounds: Normal heart sounds.  Pulmonary:     Effort: Pulmonary effort is normal. No respiratory distress.     Breath sounds: No wheezing.  Abdominal:     General: Bowel sounds are normal. There is no distension.     Palpations: Abdomen is soft.  Musculoskeletal:        General: No deformity. Normal range of motion.     Cervical back: Normal range of motion and neck supple.  Skin:    General: Skin is warm and dry.     Findings: No erythema or rash.  Neurological:     Mental Status: She is alert and oriented to person, place, and time. Mental status is at baseline.     Cranial Nerves: No cranial nerve deficit.     Coordination: Coordination normal.  Psychiatric:        Mood and Affect: Mood normal.     CMP Latest Ref Rng & Units 02/12/2018  Glucose 65 - 99 mg/dL 123(H)  BUN 6 - 20 mg/dL 32(H)  Creatinine 0.44 - 1.00 mg/dL 1.34(H)  Sodium 135 - 145  mmol/L 139  Potassium 3.5 - 5.1 mmol/L 4.1  Chloride 101 - 111 mmol/L 104  CO2 22 - 32 mmol/L 23  Calcium 8.9 - 10.3 mg/dL 9.6  Total Protein 6.5 - 8.1 g/dL -  Total Bilirubin 0.3 - 1.2 mg/dL -  Alkaline Phos 38 - 126 U/L -  AST 15 - 41 U/L -  ALT 14 - 54 U/L -   CBC Latest Ref Rng & Units 10/01/2020  WBC 4.0 - 10.5 K/uL 9.3  Hemoglobin 12.0 - 15.0 g/dL 13.4  Hematocrit 36 - 46 % 40.1  Platelets 150 - 400 K/uL 297     RADIOGRAPHIC STUDIES: I have personally reviewed the radiological images as listed and agreed with the findings in the report. No results found.  LABORATORY DATA:  I have reviewed the data as listed Lab Results  Component Value Date   WBC 9.3 10/01/2020   HGB 13.4 10/01/2020   HCT 40.1 10/01/2020   MCV 90.5 10/01/2020   PLT 297 10/01/2020   No results for input(s): NA, K, CL, CO2, GLUCOSE, BUN, CREATININE, CALCIUM, GFRNONAA, GFRAA, PROT, ALBUMIN, AST, ALT, ALKPHOS, BILITOT, BILIDIR, IBILI in the last 8760 hours. Iron/TIBC/Ferritin/ %Sat No results found for: IRON, TIBC, FERRITIN, IRONPCTSAT      ASSESSMENT & PLAN:  1. Leukocytosis, unspecified type    Labs reviewed and discussed with patient that Leukocytosis, predominantly neutrophilia, can be secondary to infection, chronic inflammation, smoking, autoimmune disease, or underlying bone marrow disorders.   Recent steroid use may be the cause of worsening of leukocytosis.  However she seems to have intermittent basophilia and monocytosis   For the work up of patient's leukocytosis, I recommend checking CBC;CMP, LDH, pathology smear review, flowcytometry, etc.     Orders Placed This Encounter  Procedures  . CBC with Differential/Platelet    Standing Status:   Future    Number of Occurrences:   1    Standing Expiration Date:   10/01/2021  .  Technologist smear review    Standing Status:   Future    Number of Occurrences:   1    Standing Expiration Date:   10/01/2021  . HIV Antibody (routine  testing w rflx)    Standing Status:   Future    Number of Occurrences:   1    Standing Expiration Date:   10/01/2021  . Hepatitis panel, acute    Standing Status:   Future    Number of Occurrences:   1    Standing Expiration Date:   10/01/2021  . Lactate dehydrogenase    Standing Status:   Future    Number of Occurrences:   1    Standing Expiration Date:   10/01/2021  . Flow cytometry panel-leukemia/lymphoma work-up    Standing Status:   Future    Number of Occurrences:   1    Standing Expiration Date:   10/01/2021    All questions were answered. The patient knows to call the clinic with any problems questions or concerns.  Return of visit: To be determined Thank you for this kind referral and the opportunity to participate in the care of this patient. A copy of today's note is routed to referring provider   Earlie Server, MD, PhD Hematology Oncology [facility] Pager- [phone] 10/01/2020

## 2020-10-03 LAB — COMP PANEL: LEUKEMIA/LYMPHOMA: Immunophenotypic Profile: 1

## 2020-10-06 ENCOUNTER — Telehealth: Payer: Self-pay

## 2020-10-06 DIAGNOSIS — D72829 Elevated white blood cell count, unspecified: Secondary | ICD-10-CM

## 2020-10-06 NOTE — Telephone Encounter (Signed)
-----   Message from Earlie Server, MD sent at 10/05/2020 12:29 AM EST ----- Please let her know that her blood work showed a small group of white blood cells that need to be monitored over time. No need for treatment for now. Recommend her to followup with me in 4 months. Please order cbc cmp ldh thanks.

## 2020-10-06 NOTE — Telephone Encounter (Signed)
Done.. Pt has been sched for lab/MD in 4 months as requested. Pt was made aware of her sched 01/27/21 appt

## 2020-10-06 NOTE — Telephone Encounter (Signed)
Patient notified and will mail a copy of lab results to her.  Please schedule her for lab/MD in 4 months and call her with ppt details.

## 2020-10-08 ENCOUNTER — Emergency Department: Payer: Medicare Other

## 2020-10-08 ENCOUNTER — Emergency Department
Admission: EM | Admit: 2020-10-08 | Discharge: 2020-10-08 | Disposition: A | Discharge status: Home / Self Care | Payer: Medicare Other | Source: Home / Self Care | Attending: Emergency Medicine | Admitting: Emergency Medicine

## 2020-10-08 ENCOUNTER — Other Ambulatory Visit: Payer: Self-pay

## 2020-10-08 DIAGNOSIS — D72821 Monocytosis (symptomatic): Secondary | ICD-10-CM | POA: Diagnosis not present

## 2020-10-08 DIAGNOSIS — S0083XA Contusion of other part of head, initial encounter: Secondary | ICD-10-CM

## 2020-10-08 DIAGNOSIS — Z85828 Personal history of other malignant neoplasm of skin: Secondary | ICD-10-CM | POA: Insufficient documentation

## 2020-10-08 DIAGNOSIS — S51812A Laceration without foreign body of left forearm, initial encounter: Secondary | ICD-10-CM | POA: Insufficient documentation

## 2020-10-08 DIAGNOSIS — Z23 Encounter for immunization: Secondary | ICD-10-CM | POA: Insufficient documentation

## 2020-10-08 DIAGNOSIS — Z79899 Other long term (current) drug therapy: Secondary | ICD-10-CM | POA: Insufficient documentation

## 2020-10-08 DIAGNOSIS — D721 Eosinophilia, unspecified: Secondary | ICD-10-CM | POA: Diagnosis not present

## 2020-10-08 DIAGNOSIS — Z96653 Presence of artificial knee joint, bilateral: Secondary | ICD-10-CM | POA: Insufficient documentation

## 2020-10-08 DIAGNOSIS — R944 Abnormal results of kidney function studies: Secondary | ICD-10-CM | POA: Diagnosis not present

## 2020-10-08 DIAGNOSIS — Y92009 Unspecified place in unspecified non-institutional (private) residence as the place of occurrence of the external cause: Secondary | ICD-10-CM | POA: Insufficient documentation

## 2020-10-08 DIAGNOSIS — N183 Chronic kidney disease, stage 3 unspecified: Secondary | ICD-10-CM | POA: Insufficient documentation

## 2020-10-08 DIAGNOSIS — W19XXXA Unspecified fall, initial encounter: Secondary | ICD-10-CM

## 2020-10-08 DIAGNOSIS — I480 Paroxysmal atrial fibrillation: Secondary | ICD-10-CM | POA: Diagnosis not present

## 2020-10-08 DIAGNOSIS — I129 Hypertensive chronic kidney disease with stage 1 through stage 4 chronic kidney disease, or unspecified chronic kidney disease: Secondary | ICD-10-CM | POA: Insufficient documentation

## 2020-10-08 DIAGNOSIS — D72819 Decreased white blood cell count, unspecified: Secondary | ICD-10-CM | POA: Diagnosis present

## 2020-10-08 DIAGNOSIS — I10 Essential (primary) hypertension: Secondary | ICD-10-CM | POA: Diagnosis not present

## 2020-10-08 DIAGNOSIS — W010XXA Fall on same level from slipping, tripping and stumbling without subsequent striking against object, initial encounter: Secondary | ICD-10-CM | POA: Insufficient documentation

## 2020-10-08 DIAGNOSIS — E538 Deficiency of other specified B group vitamins: Secondary | ICD-10-CM | POA: Diagnosis not present

## 2020-10-08 DIAGNOSIS — E785 Hyperlipidemia, unspecified: Secondary | ICD-10-CM | POA: Diagnosis not present

## 2020-10-08 DIAGNOSIS — E039 Hypothyroidism, unspecified: Secondary | ICD-10-CM | POA: Insufficient documentation

## 2020-10-08 DIAGNOSIS — Z7982 Long term (current) use of aspirin: Secondary | ICD-10-CM | POA: Insufficient documentation

## 2020-10-08 DIAGNOSIS — N189 Chronic kidney disease, unspecified: Secondary | ICD-10-CM | POA: Diagnosis not present

## 2020-10-08 DIAGNOSIS — K219 Gastro-esophageal reflux disease without esophagitis: Secondary | ICD-10-CM | POA: Diagnosis not present

## 2020-10-08 DIAGNOSIS — D649 Anemia, unspecified: Secondary | ICD-10-CM | POA: Diagnosis not present

## 2020-10-08 DIAGNOSIS — M129 Arthropathy, unspecified: Secondary | ICD-10-CM | POA: Diagnosis not present

## 2020-10-08 DIAGNOSIS — I4891 Unspecified atrial fibrillation: Secondary | ICD-10-CM | POA: Diagnosis not present

## 2020-10-08 MED ORDER — ACETAMINOPHEN 500 MG PO TABS
1000.0000 mg | ORAL_TABLET | Freq: Once | ORAL | Status: AC
  Administered 2020-10-08: 1000 mg via ORAL

## 2020-10-08 MED ORDER — TETANUS-DIPHTH-ACELL PERTUSSIS 5-2.5-18.5 LF-MCG/0.5 IM SUSY
0.5000 mL | PREFILLED_SYRINGE | Freq: Once | INTRAMUSCULAR | Status: AC
  Administered 2020-10-08: 0.5 mL via INTRAMUSCULAR
  Filled 2020-10-08: qty 0.5

## 2020-10-08 NOTE — Discharge Instructions (Addendum)
You were seen in the emergency department after a fall. Luckily all of your imaging studies did not show any evidence of injuries. Follow-up with you doctor within the next 2-3 days for further evaluation. Sometimes injuries can present at a later time and therefore it is imperative that you return to the emergency room if you have a severe headache, facial droop, neck pain, numbness or weakness of your extremities, slurred speech, difficulty finding words, chest pain, back pain, abdominal pain, or any other new symptoms that were not present during this visit. You may take Tylenol at home for your pain.  For your skin tear on your forearm, keep it cover for the next 24 hours. Then wash it daily, put bacitracin and dressing on it. Watch for signs of infection which include redness or heat of the skin, pus, or fever. If those develop, go see your doctor or return to the ER.

## 2020-10-08 NOTE — ED Triage Notes (Signed)
Pt to ED via EMS from home, pt states she was standing lost balance and fell hitting right side of head on dresser and left arm. Pt has skin tear to left forearm, right side head hematoma. Pt walks with walker. A&o x4. C/o left shoulder and head pain.

## 2020-10-08 NOTE — ED Notes (Signed)
Pt given phone to call sister at thsi time

## 2020-10-08 NOTE — ED Provider Notes (Signed)
[facility] Emergency Department Provider Note  ____________________________________________  Time seen: Approximately 3:24 AM  I have reviewed the triage vital signs and the nursing notes.   HISTORY  Chief Complaint Fall   HPI Olivia Lewis is a 86 y.o. female with history of anemia, arthritis, hypertension, hyperlipidemia, osteoporosis, hypothyroidism who presents for evaluation of a mechanical fall.   Patient was standing and trying to bend over to pull her shirt out when she lost her balance.  She fell and hit the right side of her head onto the dresser.  She is complaining of a mild frontal headache and right shoulder pain.  She also sustained a skin tear on her left forearm.  Unknown last tetanus shot.  Patient denies any dizziness and reports that the fall was mechanical in nature.  Patient denies neck pain or back pain, hip pain, chest pain or abdominal pain, lower extremity pain.  Patient does not take any blood thinners other than ASA.  Past Medical History:  Diagnosis Date  . Allergy   . Anemia   . Arthritis   . B12 deficiency   . Cancer ([place])    skin--face and arms  . Cataracts, bilateral   . Chickenpox   . Chronic kidney disease    elevated creatine  . Diverticulosis   . GERD (gastroesophageal reflux disease)   . Hyperlipidemia   . Hypertension   . Hypothyroidism   . Osteoporosis     Patient Active Problem List   Diagnosis Date Noted  . B12 deficiency 04/07/2016  . Acid reflux 04/07/2016  . HLD (hyperlipidemia) 04/07/2016  . BP (high blood pressure) 04/07/2016  . Arthritis, degenerative 04/07/2016  . OP (osteoporosis) 04/07/2016  . Primary osteoarthritis of knee 05/06/2015  . Acquired hypothyroidism 02/25/2015  . Chronic kidney disease (CKD), stage III (moderate) ([place]) 02/25/2015  . Calcium blood increased 02/25/2015  . Baker's cyst of knee 05/30/2014    Past Surgical History:  Procedure Laterality Date  . BUNIONECTOMY  Bilateral   . CARDIAC CATHETERIZATION    . CATARACT EXTRACTION Bilateral   . DILATION AND CURETTAGE OF UTERUS    . EYE SURGERY Bilateral    cataract  surgery  . JOINT REPLACEMENT Right    total knee replacement  . meniscus tear Right   . multiple skin cancer removal    . NEUROMA SURGERY Bilateral    bilateral feet  . rotator cuff surgery Right   . TOTAL KNEE ARTHROPLASTY Left 05/06/2015   Procedure: TOTAL KNEE ARTHROPLASTY;  Surgeon: Hessie Knows, MD;  Location: [facility] ORS;  Service: Orthopedics;  Laterality: Left;    Prior to Admission medications   Medication Sig Start Date End Date Taking? Authorizing Provider  acetaminophen (TYLENOL) 500 MG tablet Take 1,000 mg by mouth daily as needed.     [provider]  amLODipine-benazepril (LOTREL) 5-40 MG per capsule Take 1 capsule by mouth daily.  04/11/14   [provider]  aspirin EC 81 MG tablet Take 81 mg by mouth daily.     [provider]  atorvastatin (LIPITOR) 10 MG tablet Take 10 mg by mouth daily.     [provider]  Calcium Carb-Cholecalciferol (EQL OYSTER SHELL CALCIUM/D) 500-200 MG-UNIT TABS Take 1 tablet by mouth 2 (two) times daily.     [provider]  Cyanocobalamin (B-12 SL) Place 2,500 mcg under the tongue daily.     [provider]  docusate sodium (STOOL SOFTENER) 100 MG capsule Take 100 mg  by mouth daily as needed.     [provider]  levothyroxine (SYNTHROID, LEVOTHROID) 50 MCG tablet Take 50 mcg by mouth daily at 6 (six) AM.    [provider]  loratadine (CLARITIN) 10 MG tablet Take 10 mg by mouth daily as needed for allergies.     [provider]  metoprolol succinate (TOPROL-XL) 50 MG 24 hr tablet Take 50 mg by mouth daily.     [provider]  Omega-3 Fatty Acids (SM FISH OIL) 1000 MG CAPS Take 1 capsule by mouth daily.  Patient not taking: Reported on 10/01/2020    [provider]    Allergies Penicillin v  potassium and Sulfa antibiotics  Family History  Problem Relation Age of Onset  . Hypertension Mother   . Hypertension Father   . Diabetes Sister   . Breast cancer Sister   . Colon cancer Sister   . Bone cancer Brother     Social History Social History   Tobacco Use  . Smoking status: Never Smoker  . Smokeless tobacco: Never Used  Vaping Use  . Vaping Use: Never used  Substance Use Topics  . Alcohol use: No  . Drug use: No    Review of Systems  Constitutional: Negative for fever. Eyes: Negative for visual changes. ENT: Negative for facial injury or neck injury Cardiovascular: Negative for chest injury. Respiratory: Negative for shortness of breath. Negative for chest wall injury. Gastrointestinal: Negative for abdominal pain or injury. Genitourinary: Negative for dysuria. Musculoskeletal: Negative for back injury, + R shoulder pain. Skin: + L forearm skin tear Neurological: + head injury.   ____________________________________________   PHYSICAL EXAM:  VITAL SIGNS: ED Triage Vitals  Enc Vitals Group     BP 10/08/20 0306 (!) 151/82     Pulse Rate 10/08/20 0306 77     Resp 10/08/20 0306 18     Temp 10/08/20 0306 98 F (36.7 C)     Temp Source 10/08/20 0306 Oral     SpO2 10/08/20 0306 98 %     Weight 10/08/20 0307 166 lb (75.3 kg)     Height 10/08/20 0307 5' (1.524 m)     Head Circumference --      Peak Flow --      Pain Score 10/08/20 0306 3     Pain Loc --      Pain Edu? --      Excl. in [place]? --     Full spinal precautions maintained throughout the trauma exam. Constitutional: Alert and oriented. No acute distress. Does not appear intoxicated. HEENT Head: Normocephalic. R forehead hematoma Face: No facial bony tenderness. Stable midface Ears: No hemotympanum bilaterally. No Battle sign Eyes: No eye injury. PERRL. No raccoon eyes Nose: Nontender. No epistaxis. No rhinorrhea Mouth/Throat: Mucous membranes are moist. No oropharyngeal blood. No dental  injury. Airway patent without stridor. Normal voice. Neck: no C-collar. No midline c-spine tenderness.  Cardiovascular: Normal rate, regular rhythm. Normal and symmetric distal pulses are present in all extremities. Pulmonary/Chest: Chest wall is stable and nontender to palpation/compression. Normal respiratory effort. Breath sounds are normal. No crepitus.  Abdominal: Soft, nontender, non distended. Musculoskeletal: Nontender with normal full range of motion in all extremities. No deformities. No thoracic or lumbar midline spinal tenderness. Pelvis is stable. Skin: Skin is warm, dry and intact. Skin tear on the L forearm. Psychiatric: Speech and behavior are appropriate. Neurological: Normal speech and language. Moves all extremities to command. No gross focal neurologic deficits are  appreciated.  Glascow Coma Score: 4 - Opens eyes on own 6 - Follows simple motor commands 5 - Alert and oriented GCS: 15   ____________________________________________   LABS (all labs ordered are listed, but only abnormal results are displayed)  Labs Reviewed - No data to display ____________________________________________  EKG  ED ECG REPORT I, Rudene Re, the attending physician, personally viewed and interpreted this ECG.  Normal sinus rhythm, rate of 71, normal intervals, normal axis, no ST elevations or depressions.  Unchanged from prior from 2019 ____________________________________________  RADIOLOGY  I have personally reviewed the images performed during this visit and I agree with the Radiologist's read.   Interpretation by Radiologist:  DG Shoulder Right  Result Date: 10/08/2020 CLINICAL DATA:  Fall at home.  Right shoulder pain. EXAM: RIGHT SHOULDER - 2+ VIEW COMPARISON:  None. FINDINGS: No acute fracture or dislocation. High appearance of the humeral head suggesting chronic rotator cuff tear. Spurring and chondrocalcinosis at the [facility] joint. IMPRESSION: 1. No acute finding.  2. High humeral head suggesting chronic rotator cuff tear. Electronically Signed   By: Monte Fantasia M.D.   On: 10/08/2020 04:22   DG Forearm Left  Result Date: 10/08/2020 CLINICAL DATA:  Fall with forearm skin tear. EXAM: LEFT FOREARM - 2 VIEW COMPARISON:  None. FINDINGS: Bandage over the forearm where there is subcutaneous reticulation. Generalized osteopenia. No opaque foreign body, fracture, or subluxation. IMPRESSION: Soft tissue injury without acute osseous abnormality. Electronically Signed   By: Monte Fantasia M.D.   On: 10/08/2020 04:23   CT Head Wo Contrast  Result Date: 10/08/2020 CLINICAL DATA:  Fall with head injury. EXAM: CT HEAD WITHOUT CONTRAST CT CERVICAL SPINE WITHOUT CONTRAST TECHNIQUE: Multidetector CT imaging of the head and cervical spine was performed following the standard protocol without intravenous contrast. Multiplanar CT image reconstructions of the cervical spine were also generated. COMPARISON:  Head CT from earlier today FINDINGS: CT HEAD FINDINGS Brain: No evidence of acute infarction, hemorrhage, hydrocephalus, extra-axial collection or mass lesion/mass effect. Confluent chronic small vessel ischemia in the white matter. Age normal brain volume. Vascular: No hyperdense vessel or unexpected calcification. Skull: Progressive anterior scalp/forehead hematoma. No acute fracture Sinuses/Orbits: No evidence of injury.  Bilateral cataract resection CT CERVICAL SPINE FINDINGS Alignment: Normal. Skull base and vertebrae: C6 remote inferior endplate fracture with sclerosis. There is a lucency through the tubercle of the right transverse process which is age indeterminate. No detected adjacent soft tissue swelling. No deformity of the transverse foramen. Soft tissues and spinal canal: No prevertebral fluid or swelling. No visible canal hematoma. Disc levels:  Ordinary degenerative changes for age. Upper chest: No evidence of injury IMPRESSION: 1. Age indeterminate fracture through  the C6 right transverse process anterior tubercle. There is a definitely remote fracture of the inferior endplate at the same level which suggests remote injury at the transverse process. 2. No evidence of intracranial injury. 3. Progressive anterior scalp hematoma. Electronically Signed   By: Monte Fantasia M.D.   On: 10/08/2020 04:21   CT Cervical Spine Wo Contrast  Result Date: 10/08/2020 CLINICAL DATA:  Fall with head injury. EXAM: CT HEAD WITHOUT CONTRAST CT CERVICAL SPINE WITHOUT CONTRAST TECHNIQUE: Multidetector CT imaging of the head and cervical spine was performed following the standard protocol without intravenous contrast. Multiplanar CT image reconstructions of the cervical spine were also generated. COMPARISON:  Head CT from earlier today FINDINGS: CT HEAD FINDINGS Brain: No evidence of acute infarction, hemorrhage, hydrocephalus, extra-axial collection or mass lesion/mass effect. Confluent  chronic small vessel ischemia in the white matter. Age normal brain volume. Vascular: No hyperdense vessel or unexpected calcification. Skull: Progressive anterior scalp/forehead hematoma. No acute fracture Sinuses/Orbits: No evidence of injury.  Bilateral cataract resection CT CERVICAL SPINE FINDINGS Alignment: Normal. Skull base and vertebrae: C6 remote inferior endplate fracture with sclerosis. There is a lucency through the tubercle of the right transverse process which is age indeterminate. No detected adjacent soft tissue swelling. No deformity of the transverse foramen. Soft tissues and spinal canal: No prevertebral fluid or swelling. No visible canal hematoma. Disc levels:  Ordinary degenerative changes for age. Upper chest: No evidence of injury IMPRESSION: 1. Age indeterminate fracture through the C6 right transverse process anterior tubercle. There is a definitely remote fracture of the inferior endplate at the same level which suggests remote injury at the transverse process. 2. No evidence of  intracranial injury. 3. Progressive anterior scalp hematoma. Electronically Signed   By: Monte Fantasia M.D.   On: 10/08/2020 04:21      ____________________________________________   PROCEDURES  Procedure(s) performed:yes .1-3 Lead EKG Interpretation Performed by: Rudene Re, MD Authorized by: Rudene Re, MD     Interpretation: non-specific     ECG rate assessment: normal     Rhythm: sinus rhythm     Ectopy: none     Critical Care performed:  None ____________________________________________   INITIAL IMPRESSION / ASSESSMENT AND PLAN / ED COURSE  86 y.o. female with history of anemia, arthritis, hypertension, hyperlipidemia, osteoporosis, hypothyroidism who presents for evaluation of a mechanical fall.   Patient sustained a right-sided forehead hematoma.  No signs or symptoms of basilar skull fracture.  No mid spine tenderness.  She has a skin tear on the left forearm which was washed thoroughly and dressed with sterile dressing.  She also has tenderness to palpation of the right shoulder with no obvious deformity.  CT head, C-spine, x-ray of the right shoulder and left forearm pending.  Review of old medical records showed no recent tetanus shot over the last 10 years.  Patient was given a booster.  Will give Tylenol for pain.  Will monitor on telemetry for any signs of dysrhythmias although his fall seems to have been mechanical in nature.  EKG with no signs of dysrhythmias.   _________________________ 4:36 AM on 10/08/2020 -----------------------------------------  All imaging studies were visualized by me with no acute traumatic injury, confirmed by radiology.  At this time patient is stable for discharge home.  Discussed wound care of her skin tear and follow-up with PCP.  Discussed my standard return precautions.    ____________________________________________  Please note:  Patient was evaluated in Emergency Department today for the symptoms described in  the history of present illness. Patient was evaluated in the context of the global COVID-19 pandemic, which necessitated consideration that the patient might be at risk for infection with the SARS-CoV-2 virus that causes COVID-19. Institutional protocols and algorithms that pertain to the evaluation of patients at risk for COVID-19 are in a state of rapid change based on information released by regulatory bodies including the CDC and federal and state organizations. These policies and algorithms were followed during the patient's care in the ED.  Some ED evaluations and interventions may be delayed as a result of limited staffing during the pandemic.   ____________________________________________   FINAL CLINICAL IMPRESSION(S) / ED DIAGNOSES   Final diagnoses:  Fall, initial encounter  Traumatic hematoma of forehead, initial encounter  Skin tear of left forearm without complication, initial  encounter      NEW MEDICATIONS STARTED DURING THIS VISIT:  ED Discharge Orders    None       Note:  This document was prepared using Dragon voice recognition software and may include unintentional dictation errors.    Rudene Re, MD 10/08/20 [phone]

## 2020-10-10 ENCOUNTER — Observation Stay
Admit: 2020-10-10 | Discharge: 2020-10-10 | Disposition: A | Discharge status: Home / Self Care | Payer: Medicare Other | Attending: Internal Medicine | Admitting: Internal Medicine

## 2020-10-10 ENCOUNTER — Observation Stay: Payer: Medicare Other

## 2020-10-10 ENCOUNTER — Encounter: Payer: Self-pay | Admitting: Emergency Medicine

## 2020-10-10 ENCOUNTER — Emergency Department: Payer: Medicare Other

## 2020-10-10 ENCOUNTER — Inpatient Hospital Stay
Admission: EM | Admit: 2020-10-10 | Discharge: 2020-10-12 | DRG: 309 | Disposition: A | Discharge status: Home Health | Payer: Medicare Other | Attending: Internal Medicine | Admitting: Internal Medicine

## 2020-10-10 ENCOUNTER — Other Ambulatory Visit: Payer: Self-pay

## 2020-10-10 DIAGNOSIS — Z79890 Hormone replacement therapy (postmenopausal): Secondary | ICD-10-CM

## 2020-10-10 DIAGNOSIS — S51812A Laceration without foreign body of left forearm, initial encounter: Secondary | ICD-10-CM | POA: Diagnosis present

## 2020-10-10 DIAGNOSIS — Z7982 Long term (current) use of aspirin: Secondary | ICD-10-CM

## 2020-10-10 DIAGNOSIS — I480 Paroxysmal atrial fibrillation: Principal | ICD-10-CM | POA: Diagnosis present

## 2020-10-10 DIAGNOSIS — W19XXXA Unspecified fall, initial encounter: Secondary | ICD-10-CM

## 2020-10-10 DIAGNOSIS — E039 Hypothyroidism, unspecified: Secondary | ICD-10-CM

## 2020-10-10 DIAGNOSIS — Z9842 Cataract extraction status, left eye: Secondary | ICD-10-CM

## 2020-10-10 DIAGNOSIS — Z833 Family history of diabetes mellitus: Secondary | ICD-10-CM

## 2020-10-10 DIAGNOSIS — N179 Acute kidney failure, unspecified: Secondary | ICD-10-CM

## 2020-10-10 DIAGNOSIS — E538 Deficiency of other specified B group vitamins: Secondary | ICD-10-CM | POA: Diagnosis present

## 2020-10-10 DIAGNOSIS — I4891 Unspecified atrial fibrillation: Secondary | ICD-10-CM | POA: Diagnosis not present

## 2020-10-10 DIAGNOSIS — Z9841 Cataract extraction status, right eye: Secondary | ICD-10-CM

## 2020-10-10 DIAGNOSIS — S41112A Laceration without foreign body of left upper arm, initial encounter: Secondary | ICD-10-CM | POA: Diagnosis present

## 2020-10-10 DIAGNOSIS — S0083XA Contusion of other part of head, initial encounter: Secondary | ICD-10-CM | POA: Diagnosis present

## 2020-10-10 DIAGNOSIS — Z880 Allergy status to penicillin: Secondary | ICD-10-CM

## 2020-10-10 DIAGNOSIS — Z85828 Personal history of other malignant neoplasm of skin: Secondary | ICD-10-CM

## 2020-10-10 DIAGNOSIS — D72829 Elevated white blood cell count, unspecified: Secondary | ICD-10-CM

## 2020-10-10 DIAGNOSIS — N183 Chronic kidney disease, stage 3 unspecified: Secondary | ICD-10-CM | POA: Diagnosis present

## 2020-10-10 DIAGNOSIS — I129 Hypertensive chronic kidney disease with stage 1 through stage 4 chronic kidney disease, or unspecified chronic kidney disease: Secondary | ICD-10-CM | POA: Diagnosis present

## 2020-10-10 DIAGNOSIS — Z23 Encounter for immunization: Secondary | ICD-10-CM

## 2020-10-10 DIAGNOSIS — Z79899 Other long term (current) drug therapy: Secondary | ICD-10-CM

## 2020-10-10 DIAGNOSIS — N189 Chronic kidney disease, unspecified: Secondary | ICD-10-CM

## 2020-10-10 DIAGNOSIS — K219 Gastro-esophageal reflux disease without esophagitis: Secondary | ICD-10-CM | POA: Diagnosis present

## 2020-10-10 DIAGNOSIS — R54 Age-related physical debility: Secondary | ICD-10-CM | POA: Diagnosis present

## 2020-10-10 DIAGNOSIS — M1710 Unilateral primary osteoarthritis, unspecified knee: Secondary | ICD-10-CM | POA: Diagnosis present

## 2020-10-10 DIAGNOSIS — I10 Essential (primary) hypertension: Secondary | ICD-10-CM

## 2020-10-10 DIAGNOSIS — W010XXA Fall on same level from slipping, tripping and stumbling without subsequent striking against object, initial encounter: Secondary | ICD-10-CM | POA: Diagnosis present

## 2020-10-10 DIAGNOSIS — Z96652 Presence of left artificial knee joint: Secondary | ICD-10-CM | POA: Diagnosis present

## 2020-10-10 DIAGNOSIS — J189 Pneumonia, unspecified organism: Secondary | ICD-10-CM

## 2020-10-10 DIAGNOSIS — Z882 Allergy status to sulfonamides status: Secondary | ICD-10-CM

## 2020-10-10 DIAGNOSIS — E785 Hyperlipidemia, unspecified: Secondary | ICD-10-CM | POA: Diagnosis present

## 2020-10-10 DIAGNOSIS — Z8249 Family history of ischemic heart disease and other diseases of the circulatory system: Secondary | ICD-10-CM

## 2020-10-10 DIAGNOSIS — N1831 Chronic kidney disease, stage 3a: Secondary | ICD-10-CM | POA: Diagnosis present

## 2020-10-10 DIAGNOSIS — Z20822 Contact with and (suspected) exposure to covid-19: Secondary | ICD-10-CM | POA: Diagnosis present

## 2020-10-10 HISTORY — DX: Chronic kidney disease, stage 3 unspecified: N18.30

## 2020-10-10 LAB — COMPREHENSIVE METABOLIC PANEL
ALT: 22 U/L (ref 0–44)
AST: 40 U/L (ref 15–41)
Albumin: 4.1 g/dL (ref 3.5–5.0)
Alkaline Phosphatase: 42 U/L (ref 38–126)
Anion gap: 11 (ref 5–15)
BUN: 41 mg/dL — ABNORMAL HIGH (ref 8–23)
CO2: 24 mmol/L (ref 22–32)
Calcium: 9.8 mg/dL (ref 8.9–10.3)
Chloride: 105 mmol/L (ref 98–111)
Creatinine, Ser: 1.4 mg/dL — ABNORMAL HIGH (ref 0.44–1.00)
GFR, Estimated: 36 mL/min — ABNORMAL LOW (ref >60)
Glucose, Bld: 112 mg/dL — ABNORMAL HIGH (ref 70–99)
Potassium: 4 mmol/L (ref 3.5–5.1)
Sodium: 140 mmol/L (ref 135–145)
Total Bilirubin: 1.1 mg/dL (ref 0.3–1.2)
Total Protein: 6.9 g/dL (ref 6.5–8.1)

## 2020-10-10 LAB — TSH: TSH: 2.129 u[IU]/mL (ref 0.350–4.500)

## 2020-10-10 LAB — CBC WITH DIFFERENTIAL/PLATELET
Abs Immature Granulocytes: 0.27 10*3/uL — ABNORMAL HIGH (ref 0.00–0.07)
Basophils Absolute: 0.1 10*3/uL (ref 0.0–0.1)
Basophils Relative: 0 %
Eosinophils Absolute: 0 10*3/uL (ref 0.0–0.5)
Eosinophils Relative: 0 %
HCT: 40.1 % (ref 36.0–46.0)
Hemoglobin: 13.8 g/dL (ref 12.0–15.0)
Immature Granulocytes: 2 %
Lymphocytes Relative: 14 %
Lymphs Abs: 2.2 10*3/uL (ref 0.7–4.0)
MCH: 30.9 pg (ref 26.0–34.0)
MCHC: 34.4 g/dL (ref 30.0–36.0)
MCV: 89.7 fL (ref 80.0–100.0)
Monocytes Absolute: 2 10*3/uL — ABNORMAL HIGH (ref 0.1–1.0)
Monocytes Relative: 13 %
Neutro Abs: 10.9 10*3/uL — ABNORMAL HIGH (ref 1.7–7.7)
Neutrophils Relative %: 71 %
Platelets: 334 10*3/uL (ref 150–400)
RBC: 4.47 MIL/uL (ref 3.87–5.11)
RDW: 15.1 % (ref 11.5–15.5)
WBC: 15.5 10*3/uL — ABNORMAL HIGH (ref 4.0–10.5)
nRBC: 0 % (ref 0.0–0.2)

## 2020-10-10 LAB — URINALYSIS, COMPLETE (UACMP) WITH MICROSCOPIC
Bilirubin Urine: NEGATIVE
Glucose, UA: NEGATIVE mg/dL
Hgb urine dipstick: NEGATIVE
Ketones, ur: NEGATIVE mg/dL
Leukocytes,Ua: NEGATIVE
Nitrite: NEGATIVE
Protein, ur: NEGATIVE mg/dL
Specific Gravity, Urine: 1.013 (ref 1.005–1.030)
pH: 5 (ref 5.0–8.0)

## 2020-10-10 LAB — RESP PANEL BY RT-PCR (FLU A&B, COVID) ARPGX2
Influenza A by PCR: NEGATIVE
Influenza B by PCR: NEGATIVE
SARS Coronavirus 2 by RT PCR: NEGATIVE

## 2020-10-10 LAB — MAGNESIUM: Magnesium: 2.1 mg/dL (ref 1.7–2.4)

## 2020-10-10 LAB — LACTIC ACID, PLASMA: Lactic Acid, Venous: 1.9 mmol/L (ref 0.5–1.9)

## 2020-10-10 LAB — BRAIN NATRIURETIC PEPTIDE: B Natriuretic Peptide: 831.1 pg/mL — ABNORMAL HIGH (ref 0.0–100.0)

## 2020-10-10 LAB — TROPONIN I (HIGH SENSITIVITY): Troponin I (High Sensitivity): 32 ng/L — ABNORMAL HIGH (ref <18)

## 2020-10-10 MED ORDER — METOPROLOL TARTRATE 5 MG/5ML IV SOLN: 5.0000 mg | INTRAVENOUS | Status: AC | PRN

## 2020-10-10 MED ORDER — LACTATED RINGERS IV SOLN: INTRAVENOUS | Status: DC

## 2020-10-10 MED ORDER — MAGNESIUM SULFATE 2 GM/50ML IV SOLN
2.0000 g | Freq: Once | INTRAVENOUS | Status: AC
  Administered 2020-10-10: 2 g via INTRAVENOUS
  Filled 2020-10-10: qty 50

## 2020-10-10 MED ORDER — DILTIAZEM HCL-DEXTROSE 125-5 MG/125ML-% IV SOLN (PREMIX)
5.0000 mg/h | INTRAVENOUS | Status: DC
  Administered 2020-10-10: 5 mg/h via INTRAVENOUS
  Filled 2020-10-10: qty 125

## 2020-10-10 MED ORDER — SODIUM CHLORIDE 0.9 % IV SOLN
500.0000 mg | INTRAVENOUS | Status: DC
  Administered 2020-10-10 – 2020-10-11 (×2): 500 mg via INTRAVENOUS
  Filled 2020-10-10 (×3): qty 500

## 2020-10-10 MED ORDER — SODIUM CHLORIDE 0.9 % IV BOLUS
500.0000 mL | Freq: Once | INTRAVENOUS | Status: AC
  Administered 2020-10-10: 500 mL via INTRAVENOUS

## 2020-10-10 MED ORDER — DOCUSATE SODIUM 100 MG PO CAPS: 100.0000 mg | ORAL_CAPSULE | Freq: Every day | ORAL | Status: DC | PRN

## 2020-10-10 MED ORDER — ATORVASTATIN CALCIUM 20 MG PO TABS
10.0000 mg | ORAL_TABLET | Freq: Every day | ORAL | Status: DC
  Administered 2020-10-11 – 2020-10-12 (×2): 10 mg via ORAL
  Filled 2020-10-10 (×2): qty 1

## 2020-10-10 MED ORDER — DILTIAZEM HCL 25 MG/5ML IV SOLN
10.0000 mg | Freq: Once | INTRAVENOUS | Status: AC
  Administered 2020-10-10: 10 mg via INTRAVENOUS
  Filled 2020-10-10: qty 5

## 2020-10-10 MED ORDER — SODIUM CHLORIDE 0.9 % IV SOLN
2.0000 g | INTRAVENOUS | Status: DC
  Administered 2020-10-10 – 2020-10-11 (×2): 2 g via INTRAVENOUS
  Filled 2020-10-10 (×2): qty 20
  Filled 2020-10-10: qty 2

## 2020-10-10 MED ORDER — ONDANSETRON HCL 4 MG/2ML IJ SOLN: 4.0000 mg | Freq: Four times a day (QID) | INTRAMUSCULAR | Status: DC | PRN

## 2020-10-10 MED ORDER — IOHEXOL 300 MG/ML  SOLN
60.0000 mL | Freq: Once | INTRAMUSCULAR | Status: AC | PRN
  Administered 2020-10-10: 60 mL via INTRAVENOUS

## 2020-10-10 MED ORDER — ENOXAPARIN SODIUM 30 MG/0.3ML ~~LOC~~ SOLN: 30.0000 mg | SUBCUTANEOUS | Status: DC

## 2020-10-10 MED ORDER — LEVOTHYROXINE SODIUM 50 MCG PO TABS
50.0000 ug | ORAL_TABLET | Freq: Every day | ORAL | Status: DC
  Administered 2020-10-11 – 2020-10-12 (×2): 50 ug via ORAL
  Filled 2020-10-10 (×2): qty 1

## 2020-10-10 MED ORDER — ACETAMINOPHEN 325 MG PO TABS
650.0000 mg | ORAL_TABLET | ORAL | Status: DC | PRN
  Administered 2020-10-10 – 2020-10-11 (×3): 650 mg via ORAL
  Filled 2020-10-10 (×3): qty 2

## 2020-10-10 NOTE — H&P (Signed)
History and Physical   Olivia Lewis JSH:[id number] DOB: 03-Jun-1929 DOA: 10/10/2020  PCP: Baxter Hire, MD  Outpatient Specialists: Dr. Rudene Christians, Orthopedic Patient coming from: Home  I have personally briefly reviewed patient's old medical records in [place].  Chief Concern: sent to ED from [facility] clinic for afib with rvr  HPI: Olivia Lewis is a 86 y.o. female with medical history significant for acquired hypothyroid, hyperlipidemia, hypertension, presented to emergency department from [facility] clinic for concerns of abnormal heart rate.  She was have found to have A. fib with RVR.  No previous diagnosis of A. fib on file.  Of note patient had a fall on 10/08/20 and imaging was negative for acuity.  She also had a skin tear that was cleaned and wrapped.  Patient reports that she does not even know that her heart rate was going really fast.  Review of system was negative for headache, vision changes, fever, cough, dysphagia, odynophagia, chills, chest pain, shortness of breath, abdominal pain, dysuria, hematuria.  The endorses blurry vison and generalized weakness. She endorses chronic constipation.    Social history: She lives with her younger sister who has dementia.  Patient is her sisters primary caregiver.  Patient is widowed and did not have children.  She denies tobacco use, EtOH, recreational drug use.  ED Course: Discussed with ED provider requesting admission for afib with rvr.   Review of Systems: As per HPI otherwise 10 point review of systems negative.   Assessment/Plan  Active Problems:   Atrial fibrillation with rapid ventricular response (HCC)   Atrial fibrillation with rapid ventricular response-differential work-up includes pneumonia versus ACS versus acute acute neurologic events including CVA versus subdural hematoma in setting of recent fall with head trauma -Status post 1 L bolus of normal saline in the ED -Continue LR IVF at 150 cc/h -She was  placed on Cardizem drip in the ED for heart rate control which was then held on arrival to medicine floor due to borderline low normotensive pressure -Metoprolol tartrate injection 5 mg IV push every 5 minute as needed for heart rate greater than 130 has been ordered for 3 doses -No previous diagnosis of A. fib on file -CT of the head without contrast and CT maxillofacial with contrast ordered and read negative for acute brain injury, no acute facial bone fracture.  Read as chronic ischemic microvascular disease and mild age-related atrophic change.  Mild soft tissue swelling over the right periorbital region and lower right frontal scalp. -Cardiology has been consulted -Echo has been ordered   Sepsis is not ruled out secondary to cap pneumonia -A. fib with RVR, elevated respiration rate, leukocytosis of 15, normal lactic acid -IV antibiotics to cover for Pneumonia with azithromycin IV, ceftriaxone IV. -UA was negative for nitrites and leukocytes -Blood cultures pending  Left upper extremity skin tear secondary to fall on 10/08/2020 -dressing in place from ED visit on 10/08/2020 -Present on admission -Laceration irrigated with saline and redressed by this provider at bedside  History of hypertension-holding home antihypertensive at this time due to hypotension presentation -Amlodipine 5, benazepril 40, metoprolol succinate 50 daily  Recent fall-checking TSH, B12, vitamin D -Discussed with patient that I recommend her ambulate carefully, slowly, and with a walker  Ecchymosis of the orbital, frontal, temporal-secondary to recent fall; present on admission Hypothyroid-resumed home 50 mcg every morning Hyperlipidemia-atorvastatin 10 mg nightly Constipation prevention-Colace  Fall precaution Aspiration precaution  DVT prophylaxis: Enoxaparin Code Status: Full code Diet: Heart healthy Family  Communication: No, patient is widowed and did not have children.  She lives with younger sister  with dementia.  Patient is per primary caregiver Disposition Plan: Pending clinical course Consults called: Cardiology Admission status: Observation  Past Medical History:  Diagnosis Date  . Allergy   . Anemia   . Arthritis   . B12 deficiency   . Cancer ([place])    skin--face and arms  . Cataracts, bilateral   . Chickenpox   . CKD (chronic kidney disease), stage III ([place])   . Diverticulosis   . GERD (gastroesophageal reflux disease)   . Hyperlipidemia   . Hypertension   . Hypothyroidism   . Osteoporosis    Past Surgical History:  Procedure Laterality Date  . BUNIONECTOMY Bilateral   . CARDIAC CATHETERIZATION    . CATARACT EXTRACTION Bilateral   . DILATION AND CURETTAGE OF UTERUS    . EYE SURGERY Bilateral    cataract  surgery  . JOINT REPLACEMENT Right    total knee replacement  . meniscus tear Right   . multiple skin cancer removal    . NEUROMA SURGERY Bilateral    bilateral feet  . rotator cuff surgery Right   . TOTAL KNEE ARTHROPLASTY Left 05/06/2015   Procedure: TOTAL KNEE ARTHROPLASTY;  Surgeon: Hessie Knows, MD;  Location: [facility] ORS;  Service: Orthopedics;  Laterality: Left;   Social History:  reports that she has never smoked. She has never used smokeless tobacco. She reports that she does not drink alcohol and does not use drugs.  Allergies  Allergen Reactions  . Penicillin V Potassium Other (See Comments)    Has patient had a PCN reaction causing immediate rash, facial/tongue/throat swelling, SOB or lightheadedness with hypotension: Unknown Has patient had a PCN reaction causing severe rash involving mucus membranes or skin necrosis: Unknown Has patient had a PCN reaction that required hospitalization: Unknown Has patient had a PCN reaction occurring within the last 10 years: No If all of the above answers are "NO", then may proceed with Cephalosporin use.   . Sulfa Antibiotics Other (See Comments)    headache   Family History  Problem Relation Age of Onset   . Hypertension Mother   . Hypertension Father   . Diabetes Sister   . Breast cancer Sister   . Colon cancer Sister   . Bone cancer Brother    Family history: Family history reviewed and not pertinent for A. fib.  Positive family history for hypertension in mother and father.  Prior to Admission medications   Medication Sig Start Date End Date Taking? Authorizing Provider  acetaminophen (TYLENOL) 500 MG tablet Take 1,000 mg by mouth daily as needed.     [provider]  amLODipine-benazepril (LOTREL) 5-40 MG per capsule Take 1 capsule by mouth daily.  04/11/14   [provider]  aspirin EC 81 MG tablet Take 81 mg by mouth daily.     [provider]  atorvastatin (LIPITOR) 10 MG tablet Take 10 mg by mouth daily.     [provider]  Calcium Carb-Cholecalciferol (EQL OYSTER SHELL CALCIUM/D) 500-200 MG-UNIT TABS Take 1 tablet by mouth 2 (two) times daily.     [provider]  Cyanocobalamin (B-12 SL) Place 2,500 mcg under the tongue daily.     [provider]  docusate sodium (STOOL SOFTENER) 100 MG capsule Take 100 mg by mouth daily as needed.     [provider]  levothyroxine (SYNTHROID, LEVOTHROID) 50 MCG tablet Take 50 mcg by  mouth daily at 6 (six) AM.    [provider]  loratadine (CLARITIN) 10 MG tablet Take 10 mg by mouth daily as needed for allergies.     [provider]  metoprolol succinate (TOPROL-XL) 50 MG 24 hr tablet Take 50 mg by mouth daily.     [provider]  Omega-3 Fatty Acids (SM FISH OIL) 1000 MG CAPS Take 1 capsule by mouth daily.  Patient not taking: Reported on 10/01/2020    [provider]   Physical Exam: Vitals:   10/10/20 1721 10/10/20 1816 10/10/20 2006 10/11/20 0008  BP: 114/73 104/77 (!) 122/105 (!) 146/70  Pulse: 83 (!) 139 (!) 122 87  Resp: 20 20 18 17   Temp: 98.7 F (37.1 C) 98.6 F (37 C) 97.7 F (36.5 C) 98.6 F (37 C)  TempSrc:   Oral   SpO2: 99%  98% 97% 99%  Weight:      Height:       Constitutional: appears age-appropriate, NAD, calm, comfortable Eyes: PERRL, lids and conjunctivae normal ENMT: Mucous membranes are moist. Posterior pharynx clear of any exudate or lesions. Age-appropriate dentition. Hearing appropriate Neck: normal, supple, no masses, no thyromegaly Respiratory: clear to auscultation bilaterally, no wheezing, no crackles. Normal respiratory effort. No accessory muscle use.  Cardiovascular: Regular rate and rhythm, no murmurs / rubs / gallops. No extremity edema. 2+ pedal pulses. No carotid bruits.  Abdomen: no tenderness, no masses palpated, no hepatosplenomegaly. Bowel sounds positive.  Musculoskeletal: no clubbing / cyanosis. No joint deformity upper and lower extremities. Good ROM, no contractures, no atrophy. Normal muscle tone.  Skin: Skin tear present on the left upper extremity.  Large ecchymosis surrounding the orbital frontal and temporal area secondary to recent fall.  Large ecchymosis in bilateral upper extremities. Neurologic: CN 2-12 grossly intact. Sensation intact. Strength 5/5 in all 4.  Psychiatric: Normal judgment and insight. Alert and oriented x 3. Normal mood.   EKG: Independently reviewed, showing A. fib with rate of 139 and RVR, QTc 435  Chest x-ray on Admission: Personally reviewed and I agree with radiologist reading as below.  CT HEAD WO CONTRAST  Result Date: 10/10/2020 CLINICAL DATA:  Facial trauma after fall. EXAM: CT HEAD WITHOUT CONTRAST CT MAXILLOFACIAL WITHOUT CONTRAST TECHNIQUE: Multidetector CT imaging of the head and maxillofacial structures were performed using the standard protocol without intravenous contrast. Multiplanar CT image reconstructions of the maxillofacial structures were also generated. COMPARISON:  10/09/2019 FINDINGS: CT HEAD FINDINGS Brain: Ventricles, cisterns and other CSF spaces are within normal as there is minimal age related atrophic change present. There is  chronic ischemic microvascular disease. There is no mass, mass effect, shift of midline structures or acute hemorrhage. No evidence of acute infarction. Vascular: No hyperdense vessel or unexpected calcification. Skull: There is a scalp contusion over the midline high frontal scalp and minimally over the right lower frontal scalp. No evidence of fracture. Other: None. CT MAXILLOFACIAL FINDINGS Osseous: No acute fracture. No focal bony abnormality. Moderate degenerative changes of the temporomandibular joints bilaterally. Left lower dental carie. Orbits: Globes are normal and symmetric. Retrobulbar spaces are normal. There is mild soft tissue swelling over the right periorbital region. Sinuses: Paranasal sinuses are well aerated without air-fluid level. Ostiomeatal complexes are patent. Mastoid air cells are clear. Soft tissues: Minimal soft tissue swelling over the lower right frontal scalp as well as lateral/inferior right periorbital region. IMPRESSION: 1. No acute brain injury. 2. Chronic ischemic microvascular disease and mild age related atrophic change. 3.  No acute facial bone fracture. 4. Mild soft tissue swelling over the right periorbital region and lower right frontal scalp. Electronically Signed   By: Marin Olp M.D.   On: 10/10/2020 19:51   CT MAXILLOFACIAL W CONTRAST  Result Date: 10/10/2020 CLINICAL DATA:  Facial trauma after fall. EXAM: CT HEAD WITHOUT CONTRAST CT MAXILLOFACIAL WITHOUT CONTRAST TECHNIQUE: Multidetector CT imaging of the head and maxillofacial structures were performed using the standard protocol without intravenous contrast. Multiplanar CT image reconstructions of the maxillofacial structures were also generated. COMPARISON:  10/09/2019 FINDINGS: CT HEAD FINDINGS Brain: Ventricles, cisterns and other CSF spaces are within normal as there is minimal age related atrophic change present. There is chronic ischemic microvascular disease. There is no mass, mass effect, shift of  midline structures or acute hemorrhage. No evidence of acute infarction. Vascular: No hyperdense vessel or unexpected calcification. Skull: There is a scalp contusion over the midline high frontal scalp and minimally over the right lower frontal scalp. No evidence of fracture. Other: None. CT MAXILLOFACIAL FINDINGS Osseous: No acute fracture. No focal bony abnormality. Moderate degenerative changes of the temporomandibular joints bilaterally. Left lower dental carie. Orbits: Globes are normal and symmetric. Retrobulbar spaces are normal. There is mild soft tissue swelling over the right periorbital region. Sinuses: Paranasal sinuses are well aerated without air-fluid level. Ostiomeatal complexes are patent. Mastoid air cells are clear. Soft tissues: Minimal soft tissue swelling over the lower right frontal scalp as well as lateral/inferior right periorbital region. IMPRESSION: 1. No acute brain injury. 2. Chronic ischemic microvascular disease and mild age related atrophic change. 3. No acute facial bone fracture. 4. Mild soft tissue swelling over the right periorbital region and lower right frontal scalp. Electronically Signed   By: Marin Olp M.D.   On: 10/10/2020 19:51   DG Chest Portable 1 View  Result Date: 10/10/2020 CLINICAL DATA:  Weakness, multiple falls EXAM: PORTABLE CHEST 1 VIEW COMPARISON:  None. FINDINGS: Mild cardiomegaly. Mildly hyperinflated lungs. There is an irregular opacity within the right lung base measuring up to 2.5 cm. Left lung appears clear. No pleural effusion or pneumothorax. Degenerative changes of the thoracic spine and bilateral shoulders. IMPRESSION: 1. Irregular opacity within the right lung base suspicious for focal pneumonia. Radiographic follow-up after appropriate treatment is recommended to document resolution and exclude the presence of an underlying lung lesion. 2. Mild cardiomegaly. Electronically Signed   By: Davina Poke D.O.   On: 10/10/2020 12:25    Labs  on Admission: I have personally reviewed following labs  CBC: Recent Labs  Lab 10/10/20 1130  WBC 15.5*  NEUTROABS 10.9*  HGB 13.8  HCT 40.1  MCV 89.7  PLT 403   Basic Metabolic Panel: Recent Labs  Lab 10/10/20 1130  NA 140  K 4.0  CL 105  CO2 24  GLUCOSE 112*  BUN 41*  CREATININE 1.40*  CALCIUM 9.8  MG 2.1   GFR: Estimated Creatinine Clearance: 24.7 mL/min (A) (by C-G formula based on SCr of 1.4 mg/dL (H)). Liver Function Tests: Recent Labs  Lab 10/10/20 1130  AST 40  ALT 22  ALKPHOS 42  BILITOT 1.1  PROT 6.9  ALBUMIN 4.1   No results for input(s): LIPASE, AMYLASE in the last 168 hours. No results for input(s): AMMONIA in the last 168 hours. Coagulation Profile: No results for input(s): INR, PROTIME in the last 168 hours. Cardiac Enzymes: No results for input(s): CKTOTAL, CKMB, CKMBINDEX, TROPONINI in the last 168 hours. BNP (last 3 results) No results  for input(s): PROBNP in the last 8760 hours. HbA1C: No results for input(s): HGBA1C in the last 72 hours. CBG: No results for input(s): GLUCAP in the last 168 hours. Lipid Profile: No results for input(s): CHOL, HDL, LDLCALC, TRIG, CHOLHDL, LDLDIRECT in the last 72 hours. Thyroid Function Tests: Recent Labs    10/10/20 1610  TSH 2.129   Anemia Panel: No results for input(s): VITAMINB12, FOLATE, FERRITIN, TIBC, IRON, RETICCTPCT in the last 72 hours. Urine analysis:    Component Value Date/Time   COLORURINE YELLOW (A) 10/10/2020 1130   APPEARANCEUR CLEAR (A) 10/10/2020 1130   LABSPEC 1.013 10/10/2020 1130   PHURINE 5.0 10/10/2020 1130   GLUCOSEU NEGATIVE 10/10/2020 1130   HGBUR NEGATIVE 10/10/2020 1130   BILIRUBINUR NEGATIVE 10/10/2020 1130   KETONESUR NEGATIVE 10/10/2020 1130   PROTEINUR NEGATIVE 10/10/2020 1130   NITRITE NEGATIVE 10/10/2020 1130   LEUKOCYTESUR NEGATIVE 10/10/2020 1130    Furqan Gosselin N Maralyn Witherell D.O. [facility]  If 12AM-7AM, please contact overnight-coverage provider If  7AM-7PM, please contact day coverage provider [email or web address]  10/11/2020, 2:04 AM

## 2020-10-10 NOTE — ED Notes (Signed)
This RN to bedside at this time. Assisted pt to toilet at this time. Pt tolerated well with 1x assist.   Pt placed in clean brief at this time and back on monitor in bed. Pt handed phone to update family at this time.

## 2020-10-10 NOTE — ED Notes (Signed)
This RN called to see if accepting RN had any questions before we sent pt up. Accepting RN is looking at pt chart. They will call back if any questions, otherwise pt will be sent to floor

## 2020-10-10 NOTE — ED Notes (Signed)
Pt alert and oriented x4. NAD. Denies hx of a fib. Denies cp, SOB, swelling to legs/abdomen, dizziness, new headache. No known cardiac history. Denies thinners, just reports baby aspirin.   Pt with recent fall, some residual generalized soreness. Pt with substantial bruising, especially to right side of face and left arm.

## 2020-10-10 NOTE — Progress Notes (Signed)
Pt admitted to room 244 on cardizem gtt at 7.5mg /hr. Telemetry applied.  Admission vital signs show a BP of 80/71 and apical pulse of 94 bpm. Cardizem gtt paused immediately and MD notified. I will await any new orders. I will continue to assess.

## 2020-10-10 NOTE — Progress Notes (Signed)
°   10/10/20 1627  Assess: MEWS Score  Temp 98.8 F (37.1 C)  BP (!) 80/71  Pulse Rate 94  ECG Heart Rate (!) 109  Resp 16  Level of Consciousness Alert  SpO2 98 %  Assess: MEWS Score  MEWS Temp 0  MEWS Systolic 2  MEWS Pulse 1  MEWS RR 0  MEWS LOC 0  MEWS Score 3  MEWS Score Color Yellow  Assess: if the MEWS score is Yellow or Red  Were vital signs taken at a resting state? Yes  Focused Assessment No change from prior assessment  Early Detection of Sepsis Score *See Row Information* Low  MEWS guidelines implemented *See Row Information* Yes  Treat  MEWS Interventions Escalated (See documentation below)  Pain Scale 0-10  Pain Score 0  Take Vital Signs  Increase Vital Sign Frequency  Yellow: Q 2hr X 2 then Q 4hr X 2, if remains yellow, continue Q 4hrs  Escalate  MEWS: Escalate Yellow: discuss with charge nurse/RN and consider discussing with provider and RRT  Notify: Charge Nurse/RN  Name of Charge Nurse/RN Notified Ashly RN  Date Charge Nurse/RN Notified 10/10/20  Time Charge Nurse/RN Notified [id number]  Notify: Provider  Provider Name/Title [facility]  Date Provider Notified 10/10/20  Time Provider Notified [id number]  Notification Type Call  Notification Reason Change in status;Other (Comment)  Response Other (Comment) (secured chat and phone call to number in [place] awaiting resp)  copied and pasted on behalf of Thomas Hoff RN

## 2020-10-10 NOTE — ED Triage Notes (Signed)
Pt to ED via wheelchair from [facility] with c/o A-fib RVR, pt seen here 2 days ago for fall, pt was at PCP for follow up from ED visit. Pt denies taking blood thinners at this time.   Pt denies known hx of A-fib

## 2020-10-10 NOTE — Progress Notes (Signed)
MD notified. Pts SBP is low, HR is a-fib 90-130s Cardizem gtt paused. MD paged. New orders entered. I will continue to assess.

## 2020-10-10 NOTE — ED Notes (Signed)
Pt transported to 244-2A at this time by this RN

## 2020-10-10 NOTE — ED Provider Notes (Signed)
[facility] Emergency Department Provider Note    First MD Initiated Contact with Patient 10/10/20 1116     (approximate)  I have reviewed the triage vital signs and the nursing notes.   HISTORY  Chief Complaint Tachycardia    HPI Olivia Lewis is a 86 y.o. female below listed past medical history presents to the ER for [facility] clinic found to be in A. fib with RVR which is new.  She does take baby aspirin.  Denies any history of A. fib.  Took her morning metoprolol and antihypertensive medications.  Did have recent fall.  Denies any chest pain or pressure.  Does feel very stressed out as she is caring for her frail and disabled sister.  No recent fevers.    Past Medical History:  Diagnosis Date  . Allergy   . Anemia   . Arthritis   . B12 deficiency   . Cancer ([place])    skin--face and arms  . Cataracts, bilateral   . Chickenpox   . Chronic kidney disease    elevated creatine  . Diverticulosis   . GERD (gastroesophageal reflux disease)   . Hyperlipidemia   . Hypertension   . Hypothyroidism   . Osteoporosis    Family History  Problem Relation Age of Onset  . Hypertension Mother   . Hypertension Father   . Diabetes Sister   . Breast cancer Sister   . Colon cancer Sister   . Bone cancer Brother    Past Surgical History:  Procedure Laterality Date  . BUNIONECTOMY Bilateral   . CARDIAC CATHETERIZATION    . CATARACT EXTRACTION Bilateral   . DILATION AND CURETTAGE OF UTERUS    . EYE SURGERY Bilateral    cataract  surgery  . JOINT REPLACEMENT Right    total knee replacement  . meniscus tear Right   . multiple skin cancer removal    . NEUROMA SURGERY Bilateral    bilateral feet  . rotator cuff surgery Right   . TOTAL KNEE ARTHROPLASTY Left 05/06/2015   Procedure: TOTAL KNEE ARTHROPLASTY;  Surgeon: Hessie Knows, MD;  Location: [facility] ORS;  Service: Orthopedics;  Laterality: Left;   Patient Active Problem List   Diagnosis Date Noted  .  B12 deficiency 04/07/2016  . Acid reflux 04/07/2016  . HLD (hyperlipidemia) 04/07/2016  . BP (high blood pressure) 04/07/2016  . Arthritis, degenerative 04/07/2016  . OP (osteoporosis) 04/07/2016  . Primary osteoarthritis of knee 05/06/2015  . Acquired hypothyroidism 02/25/2015  . Chronic kidney disease (CKD), stage III (moderate) ([place]) 02/25/2015  . Calcium blood increased 02/25/2015  . Baker's cyst of knee 05/30/2014      Prior to Admission medications   Medication Sig Start Date End Date Taking? Authorizing Provider  acetaminophen (TYLENOL) 500 MG tablet Take 1,000 mg by mouth daily as needed.     [provider]  amLODipine-benazepril (LOTREL) 5-40 MG per capsule Take 1 capsule by mouth daily.  04/11/14   [provider]  aspirin EC 81 MG tablet Take 81 mg by mouth daily.     [provider]  atorvastatin (LIPITOR) 10 MG tablet Take 10 mg by mouth daily.     [provider]  Calcium Carb-Cholecalciferol (EQL OYSTER SHELL CALCIUM/D) 500-200 MG-UNIT TABS Take 1 tablet by mouth 2 (two) times daily.     [provider]  Cyanocobalamin (B-12 SL) Place 2,500 mcg under the tongue daily.     [provider]  docusate sodium (STOOL  SOFTENER) 100 MG capsule Take 100 mg by mouth daily as needed.     [provider]  levothyroxine (SYNTHROID, LEVOTHROID) 50 MCG tablet Take 50 mcg by mouth daily at 6 (six) AM.    [provider]  loratadine (CLARITIN) 10 MG tablet Take 10 mg by mouth daily as needed for allergies.     [provider]  metoprolol succinate (TOPROL-XL) 50 MG 24 hr tablet Take 50 mg by mouth daily.     [provider]  Omega-3 Fatty Acids (SM FISH OIL) 1000 MG CAPS Take 1 capsule by mouth daily.  Patient not taking: Reported on 10/01/2020    [provider]    Allergies Penicillin v potassium and Sulfa antibiotics    Social History Social History   Tobacco Use  . Smoking  status: Never Smoker  . Smokeless tobacco: Never Used  Vaping Use  . Vaping Use: Never used  Substance Use Topics  . Alcohol use: No  . Drug use: No    Review of Systems Patient denies headaches, rhinorrhea, blurry vision, numbness, shortness of breath, chest pain, edema, cough, abdominal pain, nausea, vomiting, diarrhea, dysuria, fevers, rashes or hallucinations unless otherwise stated above in HPI. ____________________________________________   PHYSICAL EXAM:  VITAL SIGNS: Vitals:   10/10/20 1351 10/10/20 1400  BP:    Pulse: (!) 139 99  Resp: 20 (!) 23  Temp:    SpO2: 98% 97%    Constitutional: Alert and oriented.  Eyes: Conjunctivae are normal.  Head: Atraumatic. Nose: No congestion/rhinnorhea. Mouth/Throat: Mucous membranes are moist.   Neck: No stridor. Painless ROM.  Cardiovascular: tachycardic irregularly irregular rhythm. Grossly normal heart sounds.  Good peripheral circulation. Respiratory: Normal respiratory effort.  No retractions. Lungs CTAB. Gastrointestinal: Soft and nontender. No distention. No abdominal bruits. No CVA tenderness. Genitourinary: deferred Musculoskeletal: No lower extremity tenderness nor edema.  No joint effusions. Neurologic:  Normal speech and language. No gross focal neurologic deficits are appreciated. No facial droop Skin:  Skin is warm, dry and intact. No rash noted. Psychiatric: Mood and affect are normal. Speech and behavior are normal.  ____________________________________________   LABS (all labs ordered are listed, but only abnormal results are displayed)  Results for orders placed or performed during the hospital encounter of 10/10/20 (from the past 24 hour(s))  Resp Panel by RT-PCR (Flu A&B, Covid) Nasopharyngeal Swab     Status: None   Collection Time: 10/10/20 11:29 AM   Specimen: Nasopharyngeal Swab; Nasopharyngeal(NP) swabs in vial transport medium  Result Value Ref Range   SARS Coronavirus 2 by RT PCR NEGATIVE  NEGATIVE   Influenza A by PCR NEGATIVE NEGATIVE   Influenza B by PCR NEGATIVE NEGATIVE  CBC with Differential     Status: Abnormal   Collection Time: 10/10/20 11:30 AM  Result Value Ref Range   WBC 15.5 (H) 4.0 - 10.5 K/uL   RBC 4.47 3.87 - 5.11 MIL/uL   Hemoglobin 13.8 12.0 - 15.0 g/dL   HCT 40.1 36 - 46 %   MCV 89.7 80.0 - 100.0 fL   MCH 30.9 26.0 - 34.0 pg   MCHC 34.4 30.0 - 36.0 g/dL   RDW 15.1 11.5 - 15.5 %   Platelets 334 150 - 400 K/uL   nRBC 0.0 0.0 - 0.2 %   Neutrophils Relative % 71 %   Neutro Abs 10.9 (H) 1.7 - 7.7 K/uL   Lymphocytes Relative 14 %   Lymphs Abs 2.2 0.7 - 4.0 K/uL   Monocytes  Relative 13 %   Monocytes Absolute 2.0 (H) 0.1 - 1.0 K/uL   Eosinophils Relative 0 %   Eosinophils Absolute 0.0 0.0 - 0.5 K/uL   Basophils Relative 0 %   Basophils Absolute 0.1 0.0 - 0.1 K/uL   Immature Granulocytes 2 %   Abs Immature Granulocytes 0.27 (H) 0.00 - 0.07 K/uL  Comprehensive metabolic panel     Status: Abnormal   Collection Time: 10/10/20 11:30 AM  Result Value Ref Range   Sodium 140 135 - 145 mmol/L   Potassium 4.0 3.5 - 5.1 mmol/L   Chloride 105 98 - 111 mmol/L   CO2 24 22 - 32 mmol/L   Glucose, Bld 112 (H) 70 - 99 mg/dL   BUN 41 (H) 8 - 23 mg/dL   Creatinine, Ser 1.40 (H) 0.44 - 1.00 mg/dL   Calcium 9.8 8.9 - 10.3 mg/dL   Total Protein 6.9 6.5 - 8.1 g/dL   Albumin 4.1 3.5 - 5.0 g/dL   AST 40 15 - 41 U/L   ALT 22 0 - 44 U/L   Alkaline Phosphatase 42 38 - 126 U/L   Total Bilirubin 1.1 0.3 - 1.2 mg/dL   GFR, Estimated 36 (L) >60 mL/min   Anion gap 11 5 - 15  Magnesium     Status: None   Collection Time: 10/10/20 11:30 AM  Result Value Ref Range   Magnesium 2.1 1.7 - 2.4 mg/dL  Troponin I (High Sensitivity)     Status: Abnormal   Collection Time: 10/10/20 11:30 AM  Result Value Ref Range   Troponin I (High Sensitivity) 32 (H) <18 ng/L  Lactic acid, plasma     Status: None   Collection Time: 10/10/20  1:01 PM  Result Value Ref Range   Lactic Acid,  Venous 1.9 0.5 - 1.9 mmol/L   ____________________________________________  EKG My review and personal interpretation at Time: 11:04   Indication: tachycardia  Rate: 140  Rhythm: afib with rvr Axis: normal Other: nonspecific st abn, no stemi ____________________________________________  RADIOLOGY  I personally reviewed all radiographic images ordered to evaluate for the above acute complaints and reviewed radiology reports and findings.  These findings were personally discussed with the patient.  Please see medical record for radiology report.  ____________________________________________   PROCEDURES  Procedure(s) performed:  .Critical Care Performed by: Merlyn Lot, MD Authorized by: Merlyn Lot, MD   Critical care provider statement:    Critical care time (minutes):  35   Critical care time was exclusive of:  Separately billable procedures and treating other patients   Critical care was necessary to treat or prevent imminent or life-threatening deterioration of the following conditions:  Sepsis and circulatory failure   Critical care was time spent personally by me on the following activities:  Development of treatment plan with patient or surrogate, discussions with consultants, evaluation of patient's response to treatment, examination of patient, obtaining history from patient or surrogate, ordering and performing treatments and interventions, ordering and review of laboratory studies, ordering and review of radiographic studies, pulse oximetry, re-evaluation of patient's condition and review of old charts      Critical Care performed: yes ____________________________________________   INITIAL IMPRESSION / ASSESSMENT AND PLAN / ED COURSE  Pertinent labs & imaging results that were available during my care of the patient were reviewed by me and considered in my medical decision making (see chart for details).   DDX: Dysrhythmia, anemia, electrolyte  abnormality, ACS, CHF  Morganne A Garner is a 86  y.o. who presents to the ED with presentation as described above.  Patient nontoxic-appearing but does have evidence of A. fib with RVR which is new.  She not complain of any chest pain right now.  Blood will be sent for the but differential we will give some IV fluids will give IV mag as well as start IV Cardizem bolus and drip.  Anticipate patient will require hospitalization but will keep on cardiac monitor work-up for the above differential  Clinical Course as of Oct 10 1414  Fri Oct 10, 2020  1233 Heart rate improved on Cardizem drip still having fluctuations in the 1 teens.  She is resting comfortably.   [PR]  7169 Patient does have white count and chest x-ray concerning for possible right lower lobe pneumonia.  She not complaining of any cough or shortness of breath she is not hypoxic but her blood pressure is low therefore will give additional IV hydration.  Will obtain blood cultures and order antibiotics.   [PR]  1346 Pressures improving now map is 100 after IV fluids.  Heart rate still bouncing around but overall trend is improving. Will discuss with hospitalist [PR]    Clinical Course User Index [PR] Merlyn Lot, MD    The patient was evaluated in Emergency Department today for the symptoms described in the history of present illness. He/she was evaluated in the context of the global COVID-19 pandemic, which necessitated consideration that the patient might be at risk for infection with the SARS-CoV-2 virus that causes COVID-19. Institutional protocols and algorithms that pertain to the evaluation of patients at risk for COVID-19 are in a state of rapid change based on information released by regulatory bodies including the CDC and federal and state organizations. These policies and algorithms were followed during the patient's care in the ED.  As part of my medical decision making, I reviewed the following data within the [place] notes reviewed and incorporated, Labs reviewed, notes from prior ED visits and [place] Controlled Substance Database   ____________________________________________   FINAL CLINICAL IMPRESSION(S) / ED DIAGNOSES  Final diagnoses:  Atrial fibrillation with rapid ventricular response ([place])      NEW MEDICATIONS STARTED DURING THIS VISIT:  New Prescriptions   No medications on file     Note:  This document was prepared using Dragon voice recognition software and may include unintentional dictation errors.    Merlyn Lot, MD 10/10/20 1416

## 2020-10-10 NOTE — ED Notes (Signed)
Pt's deacon called per request for update

## 2020-10-11 DIAGNOSIS — E538 Deficiency of other specified B group vitamins: Secondary | ICD-10-CM | POA: Diagnosis present

## 2020-10-11 DIAGNOSIS — M1710 Unilateral primary osteoarthritis, unspecified knee: Secondary | ICD-10-CM | POA: Diagnosis present

## 2020-10-11 DIAGNOSIS — Z9842 Cataract extraction status, left eye: Secondary | ICD-10-CM | POA: Diagnosis not present

## 2020-10-11 DIAGNOSIS — Z9841 Cataract extraction status, right eye: Secondary | ICD-10-CM | POA: Diagnosis not present

## 2020-10-11 DIAGNOSIS — I4891 Unspecified atrial fibrillation: Secondary | ICD-10-CM | POA: Diagnosis present

## 2020-10-11 DIAGNOSIS — E039 Hypothyroidism, unspecified: Secondary | ICD-10-CM | POA: Diagnosis present

## 2020-10-11 DIAGNOSIS — Z79899 Other long term (current) drug therapy: Secondary | ICD-10-CM | POA: Diagnosis not present

## 2020-10-11 DIAGNOSIS — Z7982 Long term (current) use of aspirin: Secondary | ICD-10-CM | POA: Diagnosis not present

## 2020-10-11 DIAGNOSIS — Z880 Allergy status to penicillin: Secondary | ICD-10-CM | POA: Diagnosis not present

## 2020-10-11 DIAGNOSIS — W19XXXS Unspecified fall, sequela: Secondary | ICD-10-CM | POA: Diagnosis not present

## 2020-10-11 DIAGNOSIS — Z96652 Presence of left artificial knee joint: Secondary | ICD-10-CM | POA: Diagnosis present

## 2020-10-11 DIAGNOSIS — Z85828 Personal history of other malignant neoplasm of skin: Secondary | ICD-10-CM | POA: Diagnosis not present

## 2020-10-11 DIAGNOSIS — E785 Hyperlipidemia, unspecified: Secondary | ICD-10-CM | POA: Diagnosis present

## 2020-10-11 DIAGNOSIS — Z23 Encounter for immunization: Secondary | ICD-10-CM | POA: Diagnosis not present

## 2020-10-11 DIAGNOSIS — W010XXA Fall on same level from slipping, tripping and stumbling without subsequent striking against object, initial encounter: Secondary | ICD-10-CM | POA: Diagnosis present

## 2020-10-11 DIAGNOSIS — Z20822 Contact with and (suspected) exposure to covid-19: Secondary | ICD-10-CM | POA: Diagnosis present

## 2020-10-11 DIAGNOSIS — N179 Acute kidney failure, unspecified: Secondary | ICD-10-CM | POA: Diagnosis present

## 2020-10-11 DIAGNOSIS — D72829 Elevated white blood cell count, unspecified: Secondary | ICD-10-CM | POA: Diagnosis not present

## 2020-10-11 DIAGNOSIS — S0083XA Contusion of other part of head, initial encounter: Secondary | ICD-10-CM | POA: Diagnosis present

## 2020-10-11 DIAGNOSIS — I480 Paroxysmal atrial fibrillation: Secondary | ICD-10-CM | POA: Diagnosis present

## 2020-10-11 DIAGNOSIS — S51812A Laceration without foreign body of left forearm, initial encounter: Secondary | ICD-10-CM | POA: Diagnosis present

## 2020-10-11 DIAGNOSIS — Z882 Allergy status to sulfonamides status: Secondary | ICD-10-CM | POA: Diagnosis not present

## 2020-10-11 DIAGNOSIS — I10 Essential (primary) hypertension: Secondary | ICD-10-CM | POA: Diagnosis not present

## 2020-10-11 DIAGNOSIS — K219 Gastro-esophageal reflux disease without esophagitis: Secondary | ICD-10-CM | POA: Diagnosis present

## 2020-10-11 DIAGNOSIS — Z833 Family history of diabetes mellitus: Secondary | ICD-10-CM | POA: Diagnosis not present

## 2020-10-11 DIAGNOSIS — Z79890 Hormone replacement therapy (postmenopausal): Secondary | ICD-10-CM | POA: Diagnosis not present

## 2020-10-11 DIAGNOSIS — Z8249 Family history of ischemic heart disease and other diseases of the circulatory system: Secondary | ICD-10-CM | POA: Diagnosis not present

## 2020-10-11 DIAGNOSIS — N1831 Chronic kidney disease, stage 3a: Secondary | ICD-10-CM | POA: Diagnosis present

## 2020-10-11 DIAGNOSIS — I129 Hypertensive chronic kidney disease with stage 1 through stage 4 chronic kidney disease, or unspecified chronic kidney disease: Secondary | ICD-10-CM | POA: Diagnosis present

## 2020-10-11 LAB — CBC
HCT: 33.8 % — ABNORMAL LOW (ref 36.0–46.0)
Hemoglobin: 11.3 g/dL — ABNORMAL LOW (ref 12.0–15.0)
MCH: 30.5 pg (ref 26.0–34.0)
MCHC: 33.4 g/dL (ref 30.0–36.0)
MCV: 91.4 fL (ref 80.0–100.0)
Platelets: 262 10*3/uL (ref 150–400)
RBC: 3.7 MIL/uL — ABNORMAL LOW (ref 3.87–5.11)
RDW: 14.9 % (ref 11.5–15.5)
WBC: 12.8 10*3/uL — ABNORMAL HIGH (ref 4.0–10.5)
nRBC: 0 % (ref 0.0–0.2)

## 2020-10-11 LAB — TSH: TSH: 2.304 u[IU]/mL (ref 0.350–4.500)

## 2020-10-11 LAB — BASIC METABOLIC PANEL
Anion gap: 8 (ref 5–15)
BUN: 27 mg/dL — ABNORMAL HIGH (ref 8–23)
CO2: 22 mmol/L (ref 22–32)
Calcium: 8.5 mg/dL — ABNORMAL LOW (ref 8.9–10.3)
Chloride: 111 mmol/L (ref 98–111)
Creatinine, Ser: 0.97 mg/dL (ref 0.44–1.00)
GFR, Estimated: 56 mL/min — ABNORMAL LOW (ref >60)
Glucose, Bld: 87 mg/dL (ref 70–99)
Potassium: 4 mmol/L (ref 3.5–5.1)
Sodium: 141 mmol/L (ref 135–145)

## 2020-10-11 LAB — VITAMIN D 25 HYDROXY (VIT D DEFICIENCY, FRACTURES): Vit D, 25-Hydroxy: 34.71 ng/mL (ref 30–100)

## 2020-10-11 LAB — PROCALCITONIN: Procalcitonin: <0.1 ng/mL

## 2020-10-11 LAB — VITAMIN B12: Vitamin B-12: 3207 pg/mL — ABNORMAL HIGH (ref 180–914)

## 2020-10-11 MED ORDER — ENOXAPARIN SODIUM 40 MG/0.4ML ~~LOC~~ SOLN
40.0000 mg | SUBCUTANEOUS | Status: DC
  Administered 2020-10-11: 40 mg via SUBCUTANEOUS
  Filled 2020-10-11 (×2): qty 0.4

## 2020-10-11 MED ORDER — METOPROLOL TARTRATE 25 MG PO TABS
12.5000 mg | ORAL_TABLET | Freq: Two times a day (BID) | ORAL | Status: DC
  Administered 2020-10-11 – 2020-10-12 (×3): 12.5 mg via ORAL
  Filled 2020-10-11 (×3): qty 1

## 2020-10-11 NOTE — Consult Note (Signed)
[facility] Consultation Note  Patient ID: Olivia Lewis, MRN: [id number], DOB/AGE: 04/29/1929 86 y.o. Admit date: 10/10/2020   Date of Consult: 10/11/2020 Primary Physician: Baxter Hire, MD Primary Cardiologist: None  Chief Complaint:  Chief Complaint  Patient presents with  . Tachycardia   Reason for Consult: Atrial fibrillation  HPI: 86 y.o. female with known hypertension hyperlipidemia and chronic kidney disease stage III with a glomerular filtration rate of 36 who has had a recent fall.  The patient says that she lost her balance and fell in her bedroom for which she hurt her arm face and other arm.  There is quite a bit of bruising but there has been no evidence of significant bleeding by CT scan of her head.  The patient additionally was seen that she had atrial fibrillation with rapid ventricular rate which may be related to her fall but currently was placed on a diltiazem drip drip.  She has spontaneously converted to normal sinus rhythm at this time with a slower heart rate.  Current heart rate is 60 bpm.  The patient also had BNP of 831 and a troponin of 32 with a chest x-ray showing the possibility of right lower lobe pneumonia.  Currently this morning she does feel quite get well with no evidence of anginal symptoms and no evidence of cough congestion or other significant concerns.  The patient was placed on Rocephin for the possibility of a right lower lobe pneumonia and is not requiring oxygenation at this time.  Past Medical History:  Diagnosis Date  . Allergy   . Anemia   . Arthritis   . B12 deficiency   . Cancer ([place])    skin--face and arms  . Cataracts, bilateral   . Chickenpox   . CKD (chronic kidney disease), stage III ([place])   . Diverticulosis   . GERD (gastroesophageal reflux disease)   . Hyperlipidemia   . Hypertension   . Hypothyroidism   . Osteoporosis       Surgical History:  Past Surgical History:  Procedure Laterality Date  .  BUNIONECTOMY Bilateral   . CARDIAC CATHETERIZATION    . CATARACT EXTRACTION Bilateral   . DILATION AND CURETTAGE OF UTERUS    . EYE SURGERY Bilateral    cataract  surgery  . JOINT REPLACEMENT Right    total knee replacement  . meniscus tear Right   . multiple skin cancer removal    . NEUROMA SURGERY Bilateral    bilateral feet  . rotator cuff surgery Right   . TOTAL KNEE ARTHROPLASTY Left 05/06/2015   Procedure: TOTAL KNEE ARTHROPLASTY;  Surgeon: Hessie Knows, MD;  Location: [facility] ORS;  Service: Orthopedics;  Laterality: Left;     Home Meds: Prior to Admission medications   Medication Sig Start Date End Date Taking? Authorizing Provider  acetaminophen (TYLENOL) 500 MG tablet Take 1,000 mg by mouth daily as needed.    Yes [provider]  amLODipine-benazepril (LOTREL) 5-40 MG per capsule Take 1 capsule by mouth daily.  04/11/14  Yes [provider]  aspirin EC 81 MG tablet Take 81 mg by mouth daily.    Yes [provider]  atorvastatin (LIPITOR) 10 MG tablet Take 10 mg by mouth daily.    Yes [provider]  Calcium Carb-Cholecalciferol (EQL OYSTER SHELL CALCIUM/D) 500-200 MG-UNIT TABS Take 1 tablet by mouth daily.    Yes [provider]  Cyanocobalamin (B-12 SL) Place 2,500 mcg under the tongue daily.  Yes [provider]  docusate sodium (STOOL SOFTENER) 100 MG capsule Take 100 mg by mouth daily as needed.    Yes [provider]  levothyroxine (SYNTHROID, LEVOTHROID) 50 MCG tablet Take 50 mcg by mouth daily at 6 (six) AM.   Yes [provider]  loratadine (CLARITIN) 10 MG tablet Take 10 mg by mouth daily as needed for allergies.    Yes [provider]  metoprolol succinate (TOPROL-XL) 50 MG 24 hr tablet Take 50 mg by mouth daily.    Yes [provider]    Inpatient Medications:  . atorvastatin  10 mg Oral Daily  . enoxaparin (LOVENOX) injection  30 mg Subcutaneous Q24H  . levothyroxine  50  mcg Oral Q0600   . azithromycin Stopped (10/10/20 1540)  . cefTRIAXone (ROCEPHIN)  IV Stopped (10/10/20 1358)  . lactated ringers 150 mL/hr at 10/11/20 0300    Allergies:  Allergies  Allergen Reactions  . Penicillin V Potassium Other (See Comments)    Has patient had a PCN reaction causing immediate rash, facial/tongue/throat swelling, SOB or lightheadedness with hypotension: Unknown Has patient had a PCN reaction causing severe rash involving mucus membranes or skin necrosis: Unknown Has patient had a PCN reaction that required hospitalization: Unknown Has patient had a PCN reaction occurring within the last 10 years: No If all of the above answers are "NO", then may proceed with Cephalosporin use.   . Sulfa Antibiotics Other (See Comments)    headache    Social History   Socioeconomic History  . Marital status: Widowed    Spouse name: Not on file  . Number of children: Not on file  . Years of education: Not on file  . Highest education level: Not on file  Occupational History  . Not on file  Tobacco Use  . Smoking status: Never Smoker  . Smokeless tobacco: Never Used  Vaping Use  . Vaping Use: Never used  Substance and Sexual Activity  . Alcohol use: No  . Drug use: No  . Sexual activity: Not Currently  Other Topics Concern  . Not on file  Social History Narrative  . Not on file   Social Determinants of Health   Financial Resource Strain:   . Difficulty of Paying Living Expenses: Not on file  Food Insecurity:   . Worried About Charity fundraiser in the Last Year: Not on file  . Ran Out of Food in the Last Year: Not on file  Transportation Needs:   . Lack of Transportation (Medical): Not on file  . Lack of Transportation (Non-Medical): Not on file  Physical Activity:   . Days of Exercise per Week: Not on file  . Minutes of Exercise per Session: Not on file  Stress:   . Feeling of Stress : Not on file  Social Connections:   . Frequency of Communication  with Friends and Family: Not on file  . Frequency of Social Gatherings with Friends and Family: Not on file  . Attends Religious Services: Not on file  . Active Member of Clubs or Organizations: Not on file  . Attends Archivist Meetings: Not on file  . Marital Status: Not on file  Intimate Partner Violence:   . Fear of Current or Ex-Partner: Not on file  . Emotionally Abused: Not on file  . Physically Abused: Not on file  . Sexually Abused: Not on file     Family History  Problem Relation Age of Onset  . Hypertension  Mother   . Hypertension Father   . Diabetes Sister   . Breast cancer Sister   . Colon cancer Sister   . Bone cancer Brother      Review of Systems Positive for fall Negative for: General:  chills, fever, night sweats or weight changes.  Cardiovascular: PND orthopnea syncope dizziness  Dermatological skin lesions rashes Respiratory: Cough congestion Urologic: Frequent urination urination at night and hematuria Abdominal: negative for nausea, vomiting, diarrhea, bright red blood per rectum, melena, or hematemesis Neurologic: negative for visual changes, and/or hearing changes  All other systems reviewed and are otherwise negative except as noted above.  Labs: No results for input(s): CKTOTAL, CKMB, TROPONINI in the last 72 hours. Lab Results  Component Value Date   WBC 12.8 (H) 10/11/2020   HGB 11.3 (L) 10/11/2020   HCT 33.8 (L) 10/11/2020   MCV 91.4 10/11/2020   PLT 262 10/11/2020    Recent Labs  Lab 10/10/20 1130 10/10/20 1130 10/11/20 0438  NA 140   < > 141  K 4.0   < > 4.0  CL 105   < > 111  CO2 24   < > 22  BUN 41*   < > 27*  CREATININE 1.40*   < > 0.97  CALCIUM 9.8   < > 8.5*  PROT 6.9  --   --   BILITOT 1.1  --   --   ALKPHOS 42  --   --   ALT 22  --   --   AST 40  --   --   GLUCOSE 112*   < > 87   < > = values in this interval not displayed.   No results found for: CHOL, HDL, LDLCALC, TRIG No results found for:  DDIMER  Radiology/Studies:  DG Shoulder Right  Result Date: 10/08/2020 CLINICAL DATA:  Fall at home.  Right shoulder pain. EXAM: RIGHT SHOULDER - 2+ VIEW COMPARISON:  None. FINDINGS: No acute fracture or dislocation. High appearance of the humeral head suggesting chronic rotator cuff tear. Spurring and chondrocalcinosis at the [facility] joint. IMPRESSION: 1. No acute finding. 2. High humeral head suggesting chronic rotator cuff tear. Electronically Signed   By: Monte Fantasia M.D.   On: 10/08/2020 04:22   DG Forearm Left  Result Date: 10/08/2020 CLINICAL DATA:  Fall with forearm skin tear. EXAM: LEFT FOREARM - 2 VIEW COMPARISON:  None. FINDINGS: Bandage over the forearm where there is subcutaneous reticulation. Generalized osteopenia. No opaque foreign body, fracture, or subluxation. IMPRESSION: Soft tissue injury without acute osseous abnormality. Electronically Signed   By: Monte Fantasia M.D.   On: 10/08/2020 04:23   CT HEAD WO CONTRAST  Result Date: 10/10/2020 CLINICAL DATA:  Facial trauma after fall. EXAM: CT HEAD WITHOUT CONTRAST CT MAXILLOFACIAL WITHOUT CONTRAST TECHNIQUE: Multidetector CT imaging of the head and maxillofacial structures were performed using the standard protocol without intravenous contrast. Multiplanar CT image reconstructions of the maxillofacial structures were also generated. COMPARISON:  10/09/2019 FINDINGS: CT HEAD FINDINGS Brain: Ventricles, cisterns and other CSF spaces are within normal as there is minimal age related atrophic change present. There is chronic ischemic microvascular disease. There is no mass, mass effect, shift of midline structures or acute hemorrhage. No evidence of acute infarction. Vascular: No hyperdense vessel or unexpected calcification. Skull: There is a scalp contusion over the midline high frontal scalp and minimally over the right lower frontal scalp. No evidence of fracture. Other: None. CT MAXILLOFACIAL FINDINGS Osseous: No acute fracture. No  focal bony abnormality. Moderate degenerative changes of the temporomandibular joints bilaterally. Left lower dental carie. Orbits: Globes are normal and symmetric. Retrobulbar spaces are normal. There is mild soft tissue swelling over the right periorbital region. Sinuses: Paranasal sinuses are well aerated without air-fluid level. Ostiomeatal complexes are patent. Mastoid air cells are clear. Soft tissues: Minimal soft tissue swelling over the lower right frontal scalp as well as lateral/inferior right periorbital region. IMPRESSION: 1. No acute brain injury. 2. Chronic ischemic microvascular disease and mild age related atrophic change. 3. No acute facial bone fracture. 4. Mild soft tissue swelling over the right periorbital region and lower right frontal scalp. Electronically Signed   By: Marin Olp M.D.   On: 10/10/2020 19:51   CT Head Wo Contrast  Result Date: 10/08/2020 CLINICAL DATA:  Fall with head injury. EXAM: CT HEAD WITHOUT CONTRAST CT CERVICAL SPINE WITHOUT CONTRAST TECHNIQUE: Multidetector CT imaging of the head and cervical spine was performed following the standard protocol without intravenous contrast. Multiplanar CT image reconstructions of the cervical spine were also generated. COMPARISON:  Head CT from earlier today FINDINGS: CT HEAD FINDINGS Brain: No evidence of acute infarction, hemorrhage, hydrocephalus, extra-axial collection or mass lesion/mass effect. Confluent chronic small vessel ischemia in the white matter. Age normal brain volume. Vascular: No hyperdense vessel or unexpected calcification. Skull: Progressive anterior scalp/forehead hematoma. No acute fracture Sinuses/Orbits: No evidence of injury.  Bilateral cataract resection CT CERVICAL SPINE FINDINGS Alignment: Normal. Skull base and vertebrae: C6 remote inferior endplate fracture with sclerosis. There is a lucency through the tubercle of the right transverse process which is age indeterminate. No detected adjacent soft  tissue swelling. No deformity of the transverse foramen. Soft tissues and spinal canal: No prevertebral fluid or swelling. No visible canal hematoma. Disc levels:  Ordinary degenerative changes for age. Upper chest: No evidence of injury IMPRESSION: 1. Age indeterminate fracture through the C6 right transverse process anterior tubercle. There is a definitely remote fracture of the inferior endplate at the same level which suggests remote injury at the transverse process. 2. No evidence of intracranial injury. 3. Progressive anterior scalp hematoma. Electronically Signed   By: Monte Fantasia M.D.   On: 10/08/2020 04:21   CT Cervical Spine Wo Contrast  Result Date: 10/08/2020 CLINICAL DATA:  Fall with head injury. EXAM: CT HEAD WITHOUT CONTRAST CT CERVICAL SPINE WITHOUT CONTRAST TECHNIQUE: Multidetector CT imaging of the head and cervical spine was performed following the standard protocol without intravenous contrast. Multiplanar CT image reconstructions of the cervical spine were also generated. COMPARISON:  Head CT from earlier today FINDINGS: CT HEAD FINDINGS Brain: No evidence of acute infarction, hemorrhage, hydrocephalus, extra-axial collection or mass lesion/mass effect. Confluent chronic small vessel ischemia in the white matter. Age normal brain volume. Vascular: No hyperdense vessel or unexpected calcification. Skull: Progressive anterior scalp/forehead hematoma. No acute fracture Sinuses/Orbits: No evidence of injury.  Bilateral cataract resection CT CERVICAL SPINE FINDINGS Alignment: Normal. Skull base and vertebrae: C6 remote inferior endplate fracture with sclerosis. There is a lucency through the tubercle of the right transverse process which is age indeterminate. No detected adjacent soft tissue swelling. No deformity of the transverse foramen. Soft tissues and spinal canal: No prevertebral fluid or swelling. No visible canal hematoma. Disc levels:  Ordinary degenerative changes for age. Upper  chest: No evidence of injury IMPRESSION: 1. Age indeterminate fracture through the C6 right transverse process anterior tubercle. There is a definitely remote fracture of the inferior endplate at the same level which  suggests remote injury at the transverse process. 2. No evidence of intracranial injury. 3. Progressive anterior scalp hematoma. Electronically Signed   By: Monte Fantasia M.D.   On: 10/08/2020 04:21   CT MAXILLOFACIAL W CONTRAST  Result Date: 10/10/2020 CLINICAL DATA:  Facial trauma after fall. EXAM: CT HEAD WITHOUT CONTRAST CT MAXILLOFACIAL WITHOUT CONTRAST TECHNIQUE: Multidetector CT imaging of the head and maxillofacial structures were performed using the standard protocol without intravenous contrast. Multiplanar CT image reconstructions of the maxillofacial structures were also generated. COMPARISON:  10/09/2019 FINDINGS: CT HEAD FINDINGS Brain: Ventricles, cisterns and other CSF spaces are within normal as there is minimal age related atrophic change present. There is chronic ischemic microvascular disease. There is no mass, mass effect, shift of midline structures or acute hemorrhage. No evidence of acute infarction. Vascular: No hyperdense vessel or unexpected calcification. Skull: There is a scalp contusion over the midline high frontal scalp and minimally over the right lower frontal scalp. No evidence of fracture. Other: None. CT MAXILLOFACIAL FINDINGS Osseous: No acute fracture. No focal bony abnormality. Moderate degenerative changes of the temporomandibular joints bilaterally. Left lower dental carie. Orbits: Globes are normal and symmetric. Retrobulbar spaces are normal. There is mild soft tissue swelling over the right periorbital region. Sinuses: Paranasal sinuses are well aerated without air-fluid level. Ostiomeatal complexes are patent. Mastoid air cells are clear. Soft tissues: Minimal soft tissue swelling over the lower right frontal scalp as well as lateral/inferior right  periorbital region. IMPRESSION: 1. No acute brain injury. 2. Chronic ischemic microvascular disease and mild age related atrophic change. 3. No acute facial bone fracture. 4. Mild soft tissue swelling over the right periorbital region and lower right frontal scalp. Electronically Signed   By: Marin Olp M.D.   On: 10/10/2020 19:51   DG Chest Portable 1 View  Result Date: 10/10/2020 CLINICAL DATA:  Weakness, multiple falls EXAM: PORTABLE CHEST 1 VIEW COMPARISON:  None. FINDINGS: Mild cardiomegaly. Mildly hyperinflated lungs. There is an irregular opacity within the right lung base measuring up to 2.5 cm. Left lung appears clear. No pleural effusion or pneumothorax. Degenerative changes of the thoracic spine and bilateral shoulders. IMPRESSION: 1. Irregular opacity within the right lung base suspicious for focal pneumonia. Radiographic follow-up after appropriate treatment is recommended to document resolution and exclude the presence of an underlying lung lesion. 2. Mild cardiomegaly. Electronically Signed   By: Davina Poke D.O.   On: 10/10/2020 12:25    EKG: Atrial fibrillation with rapid ventricular rate and nonspecific ST changes now with telemetry showing normal sinus rhythm  Weights: Filed Weights   10/10/20 1108 10/11/20 0337  Weight: 75.3 kg 76.2 kg     Physical Exam: Blood pressure (!) 160/89, pulse 66, temperature (!) 97.4 F (36.3 C), temperature source Oral, resp. rate 18, height 5' (1.524 m), weight 76.2 kg, SpO2 95 %. Body mass index is 32.83 kg/m. General: Well developed, well nourished, in no acute distress. Head eyes ears nose throat: Normocephalic, atraumatic, sclera non-icteric, no xanthomas, nares are without discharge. No apparent thyromegaly and/or mass  Lungs: Normal respiratory effort.  no wheezes, no rales, few rhonchi.  Heart: RRR with normal S1 S2. no murmur gallop, no rub, PMI is normal size and placement, carotid upstroke normal without bruit, jugular venous  pressure is normal Abdomen: Soft, non-tender, non-distended with normoactive bowel sounds. No hepatomegaly. No rebound/guarding. No obvious abdominal masses. Abdominal aorta is normal size without bruit Extremities: No edema. no cyanosis, no clubbing, no ulcers  Peripheral :  2+ bilateral upper extremity pulses, 2+ bilateral femoral pulses, 2+ bilateral dorsal pedal pulse Neuro: Alert and oriented. No facial asymmetry. No focal deficit. Moves all extremities spontaneously. Musculoskeletal: Normal muscle tone without kyphosis Psych:  Responds to questions appropriately with a normal affect.    Assessment: 86 year old female with hypertension hyperlipidemia chronic kidney disease stage III with acute fall atrial fibrillation with rapid ventricular rate and possible right lower lobe pneumonia now improved with spontaneous conversion to normal sinus rhythm and no evidence of anginal symptoms or myocardial infarction or congestive heart failure  Plan: 1.  Discontinuation of diltiazem drip and replaced with 12.5 mg of metoprolol twice per day for heart rate control and maintenance of normal sinus rhythm 2.  No additional anticoagulation at this time due to recent fall and concerns of bleeding complications and patient has had spontaneous conversion to normal sinus rhythm and will consider outpatient treatment with anticoagulation thereafter 3.  Echocardiogram for LV systolic dysfunction valvular heart disease contributing to above 4.  Continue treatment of other possible causes of her current condition including right lower lobe pneumonia with antibiotics and inhalers 5.  Begin ambulation and follow for improvements of symptoms and need for further adjustments at this time  Signed, Corey Skains M.D. [facility] 10/11/2020, 6:54 AM

## 2020-10-11 NOTE — Progress Notes (Addendum)
PROGRESS NOTE   Olivia Lewis  XJD:[id number] DOB: 1929-01-09 DOA: 10/10/2020 PCP: Baxter Hire, MD  Brief Narrative:  86 year old white female HTN, HLD, baseline creatinine 1.4, osteoporosis, hypothyroidism Previous echo 2014 EF 60% normal otherwise Knee arthroplasty 2016 on left 2011 100 Multiple prior orthopedic surgeries Chronic leukocytosis followed by Dr. Tasia Catchings of oncology  Seen at outpatient office after fall 2011 17 2021 resulting in scalp hematoma right shoulder injury and left forearm tear-fell again-and when she was seen in the office 11/found to be in rapid A. fib  Patient placed on Cardizem drip initially became hypotensive therefore stopped and given metoprolol-cardiology consulted CT head done showing no acute brain injury facial fracture mild swelling consistent with hematoma Given irregular opacity right lung base?  Focal pneumonia + leukocytosis of 15 started on azithromycin and ceftriaxone Laceration irrigated with saline   Assessment & Plan:   Principal Problem:   Atrial fibrillation with rapid ventricular response (HCC) Active Problems:   Primary osteoarthritis of knee   Acquired hypothyroidism   Chronic kidney disease (CKD), stage III (moderate) (HCC)   HLD (hyperlipidemia)   1. New onset atrial fibrillation CHADS2 score >4, has bled score >3 therefore not anticoagulation candidate a. Became hypotensive after attempts at rate control with IV meds b. Currently converted to sinus rhythm cardiology seen placed on metoprolol 12.5 twice daily per cardiology c. Very poor candidate for anticoagulation Long discussion with patient in the room 2. Multiple falls 3. Probable right rotator cuff tear with difficulty raising arm a. Restricted ROM to right arm b. Will need therapy services to see her for recommendations-she has an demented sister at home which she takes care of self will probably elect to go home regardless of their input 4. ?  Pneumonia a. Unclear  if she actually has a pneumonia b. Nonemergent chest x-ray ordered and reassess the same c. Procalcitonin in the morning and follow x-ray-if concerns for pneumonia are present continue antibiotics for 5 days but will transition to oral tablets Levaquin in a.m. 11/21 5. Multiple or prior orthopedic procedures 6. Chronic leukocytosis a. Follows with Dr.yu oncology in the outpatient setting b. Outpatient further work-up depending on x-ray findings etc. 7. HTN a. Discontinued this admission Lotrel-Toprol-XL at home is 50 mg b. Titrate meds for rate control 8. AKI superimposed on CKD 3a on admission a. Creatinine trending down-would not discharge on ARB or ACE b. Patient able to eat and drink therefore saline lock 9. Hypothyroid a. TSH this admission within normal range no further work-up  DVT prophylaxis: Subcu Lovenox Code Status: Full Family Communication: None present Disposition:  Status is: Observation  The patient will require care spanning > 2 midnights and should be moved to inpatient because: Hemodynamically unstable, Ongoing active pain requiring inpatient pain management, Altered mental status and Unsafe d/c plan  Dispo: The patient is from: Home              Anticipated d/c is to: Home              Anticipated d/c date is: 2 days              Patient currently is not medically stable to d/c.    Consultants:   Cardiologist  Procedures: Echo 11/19 report pending  Antimicrobials: Azithromycin ceftriaxone   Subjective: Doing fair in some pain in her right shoulder No fever no chills no cough No nausea no vomiting Relates that she demented sister-tells me she does not feel safe in  current state to drive and I agreed with her  Objective: Vitals:   10/10/20 2006 10/11/20 0008 10/11/20 0337 10/11/20 0816  BP: (!) 122/105 (!) 146/70 (!) 160/89 (!) 157/79  Pulse: (!) 122 87 66 88  Resp: 18 17 18 17   Temp: 97.7 F (36.5 C) 98.6 F (37 C) (!) 97.4 F (36.3 C) 97.7 F  (36.5 C)  TempSrc: Oral  Oral Oral  SpO2: 97% 99% 95% 98%  Weight:   76.2 kg   Height:        Intake/Output Summary (Last 24 hours) at 10/11/2020 3536 Last data filed at 10/11/2020 1443 Gross per 24 hour  Intake 1762.92 ml  Output 500 ml  Net 1262.92 ml   Filed Weights   10/10/20 1108 10/11/20 0337  Weight: 75.3 kg 76.2 kg    Examination: EOMI NCAT significant bruising over upper extremity left upper extremity with bluish  S1-S2 NSR with heart rates predominantly in the 50s Abdomen soft No lower extremity edema ROM intact power 5/5 but limited in right upper extremity external/internal rotation straight arm raise Lower extremities not swollen   Data Reviewed: I have personally reviewed following labs and imaging studies BUN/creatinine 41/1.4-->27/0.9 BNP 831 Troponin 32 WBC 15.5-->12.8 Hemoglobin 13.8-->11.3 Platelet 262  Radiology Studies: CT HEAD WO CONTRAST  Result Date: 10/10/2020 CLINICAL DATA:  Facial trauma after fall. EXAM: CT HEAD WITHOUT CONTRAST CT MAXILLOFACIAL WITHOUT CONTRAST TECHNIQUE: Multidetector CT imaging of the head and maxillofacial structures were performed using the standard protocol without intravenous contrast. Multiplanar CT image reconstructions of the maxillofacial structures were also generated. COMPARISON:  10/09/2019 FINDINGS: CT HEAD FINDINGS Brain: Ventricles, cisterns and other CSF spaces are within normal as there is minimal age related atrophic change present. There is chronic ischemic microvascular disease. There is no mass, mass effect, shift of midline structures or acute hemorrhage. No evidence of acute infarction. Vascular: No hyperdense vessel or unexpected calcification. Skull: There is a scalp contusion over the midline high frontal scalp and minimally over the right lower frontal scalp. No evidence of fracture. Other: None. CT MAXILLOFACIAL FINDINGS Osseous: No acute fracture. No focal bony abnormality. Moderate degenerative  changes of the temporomandibular joints bilaterally. Left lower dental carie. Orbits: Globes are normal and symmetric. Retrobulbar spaces are normal. There is mild soft tissue swelling over the right periorbital region. Sinuses: Paranasal sinuses are well aerated without air-fluid level. Ostiomeatal complexes are patent. Mastoid air cells are clear. Soft tissues: Minimal soft tissue swelling over the lower right frontal scalp as well as lateral/inferior right periorbital region. IMPRESSION: 1. No acute brain injury. 2. Chronic ischemic microvascular disease and mild age related atrophic change. 3. No acute facial bone fracture. 4. Mild soft tissue swelling over the right periorbital region and lower right frontal scalp. Electronically Signed   By: Marin Olp M.D.   On: 10/10/2020 19:51   CT MAXILLOFACIAL W CONTRAST  Result Date: 10/10/2020 CLINICAL DATA:  Facial trauma after fall. EXAM: CT HEAD WITHOUT CONTRAST CT MAXILLOFACIAL WITHOUT CONTRAST TECHNIQUE: Multidetector CT imaging of the head and maxillofacial structures were performed using the standard protocol without intravenous contrast. Multiplanar CT image reconstructions of the maxillofacial structures were also generated. COMPARISON:  10/09/2019 FINDINGS: CT HEAD FINDINGS Brain: Ventricles, cisterns and other CSF spaces are within normal as there is minimal age related atrophic change present. There is chronic ischemic microvascular disease. There is no mass, mass effect, shift of midline structures or acute hemorrhage. No evidence of acute infarction. Vascular: No hyperdense vessel or  unexpected calcification. Skull: There is a scalp contusion over the midline high frontal scalp and minimally over the right lower frontal scalp. No evidence of fracture. Other: None. CT MAXILLOFACIAL FINDINGS Osseous: No acute fracture. No focal bony abnormality. Moderate degenerative changes of the temporomandibular joints bilaterally. Left lower dental carie.  Orbits: Globes are normal and symmetric. Retrobulbar spaces are normal. There is mild soft tissue swelling over the right periorbital region. Sinuses: Paranasal sinuses are well aerated without air-fluid level. Ostiomeatal complexes are patent. Mastoid air cells are clear. Soft tissues: Minimal soft tissue swelling over the lower right frontal scalp as well as lateral/inferior right periorbital region. IMPRESSION: 1. No acute brain injury. 2. Chronic ischemic microvascular disease and mild age related atrophic change. 3. No acute facial bone fracture. 4. Mild soft tissue swelling over the right periorbital region and lower right frontal scalp. Electronically Signed   By: Marin Olp M.D.   On: 10/10/2020 19:51   DG Chest Portable 1 View  Result Date: 10/10/2020 CLINICAL DATA:  Weakness, multiple falls EXAM: PORTABLE CHEST 1 VIEW COMPARISON:  None. FINDINGS: Mild cardiomegaly. Mildly hyperinflated lungs. There is an irregular opacity within the right lung base measuring up to 2.5 cm. Left lung appears clear. No pleural effusion or pneumothorax. Degenerative changes of the thoracic spine and bilateral shoulders. IMPRESSION: 1. Irregular opacity within the right lung base suspicious for focal pneumonia. Radiographic follow-up after appropriate treatment is recommended to document resolution and exclude the presence of an underlying lung lesion. 2. Mild cardiomegaly. Electronically Signed   By: Davina Poke D.O.   On: 10/10/2020 12:25     Scheduled Meds: . atorvastatin  10 mg Oral Daily  . enoxaparin (LOVENOX) injection  40 mg Subcutaneous Q24H  . levothyroxine  50 mcg Oral Q0600  . metoprolol tartrate  12.5 mg Oral BID   Continuous Infusions: . azithromycin Stopped (10/10/20 1540)  . cefTRIAXone (ROCEPHIN)  IV Stopped (10/10/20 1358)  . lactated ringers 150 mL/hr at 10/11/20 0656     LOS: 0 days    Time spent: [place], MD [facility] To contact the attending  provider between 7A-7P or the covering provider during after hours 7P-7A, please log into the web site [email or web address] and access using universal [place] password for that web site. If you do not have the password, please call the hospital operator.  10/11/2020, 8:23 AM

## 2020-10-12 ENCOUNTER — Inpatient Hospital Stay: Payer: Medicare Other

## 2020-10-12 DIAGNOSIS — I480 Paroxysmal atrial fibrillation: Secondary | ICD-10-CM | POA: Diagnosis not present

## 2020-10-12 DIAGNOSIS — I10 Essential (primary) hypertension: Secondary | ICD-10-CM

## 2020-10-12 DIAGNOSIS — N189 Chronic kidney disease, unspecified: Secondary | ICD-10-CM

## 2020-10-12 DIAGNOSIS — E785 Hyperlipidemia, unspecified: Secondary | ICD-10-CM

## 2020-10-12 DIAGNOSIS — W19XXXS Unspecified fall, sequela: Secondary | ICD-10-CM

## 2020-10-12 DIAGNOSIS — D72829 Elevated white blood cell count, unspecified: Secondary | ICD-10-CM

## 2020-10-12 DIAGNOSIS — W19XXXA Unspecified fall, initial encounter: Secondary | ICD-10-CM

## 2020-10-12 DIAGNOSIS — N179 Acute kidney failure, unspecified: Secondary | ICD-10-CM

## 2020-10-12 DIAGNOSIS — E039 Hypothyroidism, unspecified: Secondary | ICD-10-CM

## 2020-10-12 LAB — COMPREHENSIVE METABOLIC PANEL
ALT: 15 U/L (ref 0–44)
AST: 19 U/L (ref 15–41)
Albumin: 3 g/dL — ABNORMAL LOW (ref 3.5–5.0)
Alkaline Phosphatase: 31 U/L — ABNORMAL LOW (ref 38–126)
Anion gap: 4 — ABNORMAL LOW (ref 5–15)
BUN: 25 mg/dL — ABNORMAL HIGH (ref 8–23)
CO2: 24 mmol/L (ref 22–32)
Calcium: 8.6 mg/dL — ABNORMAL LOW (ref 8.9–10.3)
Chloride: 107 mmol/L (ref 98–111)
Creatinine, Ser: 1.03 mg/dL — ABNORMAL HIGH (ref 0.44–1.00)
GFR, Estimated: 52 mL/min — ABNORMAL LOW (ref >60)
Glucose, Bld: 112 mg/dL — ABNORMAL HIGH (ref 70–99)
Potassium: 3.8 mmol/L (ref 3.5–5.1)
Sodium: 135 mmol/L (ref 135–145)
Total Bilirubin: 0.8 mg/dL (ref 0.3–1.2)
Total Protein: 5.3 g/dL — ABNORMAL LOW (ref 6.5–8.1)

## 2020-10-12 LAB — CBC WITH DIFFERENTIAL/PLATELET
Abs Immature Granulocytes: 0.22 10*3/uL — ABNORMAL HIGH (ref 0.00–0.07)
Basophils Absolute: 0.1 10*3/uL (ref 0.0–0.1)
Basophils Relative: 1 %
Eosinophils Absolute: 0.3 10*3/uL (ref 0.0–0.5)
Eosinophils Relative: 2 %
HCT: 34.1 % — ABNORMAL LOW (ref 36.0–46.0)
Hemoglobin: 11.6 g/dL — ABNORMAL LOW (ref 12.0–15.0)
Immature Granulocytes: 2 %
Lymphocytes Relative: 17 %
Lymphs Abs: 2.2 10*3/uL (ref 0.7–4.0)
MCH: 30.2 pg (ref 26.0–34.0)
MCHC: 34 g/dL (ref 30.0–36.0)
MCV: 88.8 fL (ref 80.0–100.0)
Monocytes Absolute: 1.3 10*3/uL — ABNORMAL HIGH (ref 0.1–1.0)
Monocytes Relative: 10 %
Neutro Abs: 8.8 10*3/uL — ABNORMAL HIGH (ref 1.7–7.7)
Neutrophils Relative %: 68 %
Platelets: 278 10*3/uL (ref 150–400)
RBC: 3.84 MIL/uL — ABNORMAL LOW (ref 3.87–5.11)
RDW: 14.9 % (ref 11.5–15.5)
WBC: 12.9 10*3/uL — ABNORMAL HIGH (ref 4.0–10.5)
nRBC: 0 % (ref 0.0–0.2)

## 2020-10-12 LAB — ECHOCARDIOGRAM COMPLETE
Height: 60 in
S' Lateral: 1.75 cm
Weight: 2656 oz

## 2020-10-12 LAB — PROCALCITONIN: Procalcitonin: <0.1 ng/mL

## 2020-10-12 MED ORDER — METOPROLOL TARTRATE 25 MG PO TABS: 25.0000 mg | ORAL_TABLET | Freq: Two times a day (BID) | ORAL | Status: DC

## 2020-10-12 MED ORDER — METOPROLOL TARTRATE 25 MG PO TABS
25.0000 mg | ORAL_TABLET | Freq: Two times a day (BID) | ORAL | 0 refills | Status: DC
Start: 2020-10-12 — End: 2021-04-09

## 2020-10-12 NOTE — Progress Notes (Signed)
[facility] Cardiology [facility] Encounter Note  Patient: Olivia Lewis / Admit Date: 10/10/2020 / Date of Encounter: 10/12/2020, 8:28 AM   Subjective: 11/21 patient is feeling much better.  She is not had any significant symptoms of chest pain shortness of breath cough congestion.  She does have a chest x-ray consistent with possible right lower lobe pneumonia for which she has had no evidence of changes by exam today or crackles or rhonchi.  The patient has had a atrial fibrillation which spontaneously converted to normal sinus rhythm now on a very low-dose of metoprolol and stable.  There has been concerns of addition of medication management for anticoagulation due to the patient's recent fall although she did trip that she said and did not have syncope.  Echocardiogram showing normal LV systolic function with ejection fraction of 60% and no evidence of significant valvular heart disease  Review of Systems: Positive for: None Negative for: Vision change, hearing change, syncope, dizziness, nausea, vomiting,diarrhea, bloody stool, stomach pain, cough, congestion, diaphoresis, urinary frequency, urinary pain,skin lesions, skin rashes Others previously listed  Objective: Telemetry: Normal sinus rhythm Physical Exam: Blood pressure (!) 163/91, pulse 97, temperature 97.8 F (36.6 C), temperature source Oral, resp. rate 18, height 5' (1.524 m), weight 76.2 kg, SpO2 96 %. Body mass index is 32.79 kg/m. General: Well developed, well nourished, in no acute distress. Head: Normocephalic, atraumatic, sclera non-icteric, no xanthomas, nares are without discharge. Neck: No apparent masses Lungs: Normal respirations with no wheezes, no rhonchi, no rales , no crackles   Heart: Regular rate and rhythm, normal S1 S2, no murmur, no rub, no gallop, PMI is normal size and placement, carotid upstroke normal without bruit, jugular venous pressure normal Abdomen: Soft, non-tender, non-distended with  normoactive bowel sounds. No hepatosplenomegaly. Abdominal aorta is normal size without bruit Extremities: No edema, no clubbing, no cyanosis, no ulcers,  Peripheral: 2+ radial, 2+ femoral, 2+ dorsal pedal pulses Neuro: Alert and oriented. Moves all extremities spontaneously. Psych:  Responds to questions appropriately with a normal affect.   Intake/Output Summary (Last 24 hours) at 10/12/2020 0828 Last data filed at 10/12/2020 0741 Gross per 24 hour  Intake 1547.34 ml  Output 950 ml  Net 597.34 ml    Inpatient Medications:  . atorvastatin  10 mg Oral Daily  . enoxaparin (LOVENOX) injection  40 mg Subcutaneous Q24H  . levothyroxine  50 mcg Oral Q0600  . metoprolol tartrate  12.5 mg Oral BID   Infusions:  . azithromycin Stopped (10/11/20 1443)  . cefTRIAXone (ROCEPHIN)  IV Stopped (10/11/20 1430)    Labs: Recent Labs    10/10/20 1130 10/10/20 1130 10/11/20 0438 10/12/20 0439  NA 140   < > 141 135  K 4.0   < > 4.0 3.8  CL 105   < > 111 107  CO2 24   < > 22 24  GLUCOSE 112*   < > 87 112*  BUN 41*   < > 27* 25*  CREATININE 1.40*   < > 0.97 1.03*  CALCIUM 9.8   < > 8.5* 8.6*  MG 2.1  --   --   --    < > = values in this interval not displayed.   Recent Labs    10/10/20 1130 10/12/20 0439  AST 40 19  ALT 22 15  ALKPHOS 42 31*  BILITOT 1.1 0.8  PROT 6.9 5.3*  ALBUMIN 4.1 3.0*   Recent Labs    10/10/20 1130 10/10/20 1130 10/11/20 0438 10/12/20  0439  WBC 15.5*   < > 12.8* 12.9*  NEUTROABS 10.9*  --   --  8.8*  HGB 13.8   < > 11.3* 11.6*  HCT 40.1   < > 33.8* 34.1*  MCV 89.7   < > 91.4 88.8  PLT 334   < > 262 278   < > = values in this interval not displayed.   No results for input(s): CKTOTAL, CKMB, TROPONINI in the last 72 hours. Invalid input(s): POCBNP No results for input(s): HGBA1C in the last 72 hours.   Weights: Filed Weights   10/10/20 1108 10/11/20 0337 10/12/20 0406  Weight: 75.3 kg 76.2 kg 76.2 kg     Radiology/Studies:  DG Chest 2  View  Result Date: 10/12/2020 CLINICAL DATA:  86 year old female status post fall. Atrial fibrillation. EXAM: CHEST - 2 VIEW COMPARISON:  Portable chest 10/10/2020. FINDINGS: Seated AP and lateral views. Stable tortuosity of the thoracic aorta. Mild-to-moderate cardiomegaly. Other mediastinal contours are within normal limits. Visualized tracheal air column is within normal limits. No pneumothorax, pulmonary edema or consolidation. However evidence of small bilateral pleural effusions more apparent on the left. Osteopenia. Degenerative changes in the spine. No acute osseous abnormality identified. Negative visible bowel gas pattern. IMPRESSION: 1. Small bilateral pleural effusions, more apparent on the left. 2. Cardiomegaly. 3. No other acute cardiopulmonary abnormality. Electronically Signed   By: Genevie Ann M.D.   On: 10/12/2020 07:58   DG Shoulder Right  Result Date: 10/08/2020 CLINICAL DATA:  Fall at home.  Right shoulder pain. EXAM: RIGHT SHOULDER - 2+ VIEW COMPARISON:  None. FINDINGS: No acute fracture or dislocation. High appearance of the humeral head suggesting chronic rotator cuff tear. Spurring and chondrocalcinosis at the [facility] joint. IMPRESSION: 1. No acute finding. 2. High humeral head suggesting chronic rotator cuff tear. Electronically Signed   By: Monte Fantasia M.D.   On: 10/08/2020 04:22   DG Forearm Left  Result Date: 10/08/2020 CLINICAL DATA:  Fall with forearm skin tear. EXAM: LEFT FOREARM - 2 VIEW COMPARISON:  None. FINDINGS: Bandage over the forearm where there is subcutaneous reticulation. Generalized osteopenia. No opaque foreign body, fracture, or subluxation. IMPRESSION: Soft tissue injury without acute osseous abnormality. Electronically Signed   By: Monte Fantasia M.D.   On: 10/08/2020 04:23   CT HEAD WO CONTRAST  Result Date: 10/10/2020 CLINICAL DATA:  Facial trauma after fall. EXAM: CT HEAD WITHOUT CONTRAST CT MAXILLOFACIAL WITHOUT CONTRAST TECHNIQUE: Multidetector CT  imaging of the head and maxillofacial structures were performed using the standard protocol without intravenous contrast. Multiplanar CT image reconstructions of the maxillofacial structures were also generated. COMPARISON:  10/09/2019 FINDINGS: CT HEAD FINDINGS Brain: Ventricles, cisterns and other CSF spaces are within normal as there is minimal age related atrophic change present. There is chronic ischemic microvascular disease. There is no mass, mass effect, shift of midline structures or acute hemorrhage. No evidence of acute infarction. Vascular: No hyperdense vessel or unexpected calcification. Skull: There is a scalp contusion over the midline high frontal scalp and minimally over the right lower frontal scalp. No evidence of fracture. Other: None. CT MAXILLOFACIAL FINDINGS Osseous: No acute fracture. No focal bony abnormality. Moderate degenerative changes of the temporomandibular joints bilaterally. Left lower dental carie. Orbits: Globes are normal and symmetric. Retrobulbar spaces are normal. There is mild soft tissue swelling over the right periorbital region. Sinuses: Paranasal sinuses are well aerated without air-fluid level. Ostiomeatal complexes are patent. Mastoid air cells are clear. Soft tissues: Minimal soft tissue  swelling over the lower right frontal scalp as well as lateral/inferior right periorbital region. IMPRESSION: 1. No acute brain injury. 2. Chronic ischemic microvascular disease and mild age related atrophic change. 3. No acute facial bone fracture. 4. Mild soft tissue swelling over the right periorbital region and lower right frontal scalp. Electronically Signed   By: Marin Olp M.D.   On: 10/10/2020 19:51   CT Head Wo Contrast  Result Date: 10/08/2020 CLINICAL DATA:  Fall with head injury. EXAM: CT HEAD WITHOUT CONTRAST CT CERVICAL SPINE WITHOUT CONTRAST TECHNIQUE: Multidetector CT imaging of the head and cervical spine was performed following the standard protocol without  intravenous contrast. Multiplanar CT image reconstructions of the cervical spine were also generated. COMPARISON:  Head CT from earlier today FINDINGS: CT HEAD FINDINGS Brain: No evidence of acute infarction, hemorrhage, hydrocephalus, extra-axial collection or mass lesion/mass effect. Confluent chronic small vessel ischemia in the white matter. Age normal brain volume. Vascular: No hyperdense vessel or unexpected calcification. Skull: Progressive anterior scalp/forehead hematoma. No acute fracture Sinuses/Orbits: No evidence of injury.  Bilateral cataract resection CT CERVICAL SPINE FINDINGS Alignment: Normal. Skull base and vertebrae: C6 remote inferior endplate fracture with sclerosis. There is a lucency through the tubercle of the right transverse process which is age indeterminate. No detected adjacent soft tissue swelling. No deformity of the transverse foramen. Soft tissues and spinal canal: No prevertebral fluid or swelling. No visible canal hematoma. Disc levels:  Ordinary degenerative changes for age. Upper chest: No evidence of injury IMPRESSION: 1. Age indeterminate fracture through the C6 right transverse process anterior tubercle. There is a definitely remote fracture of the inferior endplate at the same level which suggests remote injury at the transverse process. 2. No evidence of intracranial injury. 3. Progressive anterior scalp hematoma. Electronically Signed   By: Monte Fantasia M.D.   On: 10/08/2020 04:21   CT Cervical Spine Wo Contrast  Result Date: 10/08/2020 CLINICAL DATA:  Fall with head injury. EXAM: CT HEAD WITHOUT CONTRAST CT CERVICAL SPINE WITHOUT CONTRAST TECHNIQUE: Multidetector CT imaging of the head and cervical spine was performed following the standard protocol without intravenous contrast. Multiplanar CT image reconstructions of the cervical spine were also generated. COMPARISON:  Head CT from earlier today FINDINGS: CT HEAD FINDINGS Brain: No evidence of acute infarction,  hemorrhage, hydrocephalus, extra-axial collection or mass lesion/mass effect. Confluent chronic small vessel ischemia in the white matter. Age normal brain volume. Vascular: No hyperdense vessel or unexpected calcification. Skull: Progressive anterior scalp/forehead hematoma. No acute fracture Sinuses/Orbits: No evidence of injury.  Bilateral cataract resection CT CERVICAL SPINE FINDINGS Alignment: Normal. Skull base and vertebrae: C6 remote inferior endplate fracture with sclerosis. There is a lucency through the tubercle of the right transverse process which is age indeterminate. No detected adjacent soft tissue swelling. No deformity of the transverse foramen. Soft tissues and spinal canal: No prevertebral fluid or swelling. No visible canal hematoma. Disc levels:  Ordinary degenerative changes for age. Upper chest: No evidence of injury IMPRESSION: 1. Age indeterminate fracture through the C6 right transverse process anterior tubercle. There is a definitely remote fracture of the inferior endplate at the same level which suggests remote injury at the transverse process. 2. No evidence of intracranial injury. 3. Progressive anterior scalp hematoma. Electronically Signed   By: Monte Fantasia M.D.   On: 10/08/2020 04:21   CT MAXILLOFACIAL W CONTRAST  Result Date: 10/10/2020 CLINICAL DATA:  Facial trauma after fall. EXAM: CT HEAD WITHOUT CONTRAST CT MAXILLOFACIAL WITHOUT CONTRAST TECHNIQUE:  Multidetector CT imaging of the head and maxillofacial structures were performed using the standard protocol without intravenous contrast. Multiplanar CT image reconstructions of the maxillofacial structures were also generated. COMPARISON:  10/09/2019 FINDINGS: CT HEAD FINDINGS Brain: Ventricles, cisterns and other CSF spaces are within normal as there is minimal age related atrophic change present. There is chronic ischemic microvascular disease. There is no mass, mass effect, shift of midline structures or acute  hemorrhage. No evidence of acute infarction. Vascular: No hyperdense vessel or unexpected calcification. Skull: There is a scalp contusion over the midline high frontal scalp and minimally over the right lower frontal scalp. No evidence of fracture. Other: None. CT MAXILLOFACIAL FINDINGS Osseous: No acute fracture. No focal bony abnormality. Moderate degenerative changes of the temporomandibular joints bilaterally. Left lower dental carie. Orbits: Globes are normal and symmetric. Retrobulbar spaces are normal. There is mild soft tissue swelling over the right periorbital region. Sinuses: Paranasal sinuses are well aerated without air-fluid level. Ostiomeatal complexes are patent. Mastoid air cells are clear. Soft tissues: Minimal soft tissue swelling over the lower right frontal scalp as well as lateral/inferior right periorbital region. IMPRESSION: 1. No acute brain injury. 2. Chronic ischemic microvascular disease and mild age related atrophic change. 3. No acute facial bone fracture. 4. Mild soft tissue swelling over the right periorbital region and lower right frontal scalp. Electronically Signed   By: Marin Olp M.D.   On: 10/10/2020 19:51   DG Chest Portable 1 View  Result Date: 10/10/2020 CLINICAL DATA:  Weakness, multiple falls EXAM: PORTABLE CHEST 1 VIEW COMPARISON:  None. FINDINGS: Mild cardiomegaly. Mildly hyperinflated lungs. There is an irregular opacity within the right lung base measuring up to 2.5 cm. Left lung appears clear. No pleural effusion or pneumothorax. Degenerative changes of the thoracic spine and bilateral shoulders. IMPRESSION: 1. Irregular opacity within the right lung base suspicious for focal pneumonia. Radiographic follow-up after appropriate treatment is recommended to document resolution and exclude the presence of an underlying lung lesion. 2. Mild cardiomegaly. Electronically Signed   By: Davina Poke D.O.   On: 10/10/2020 12:25   ECHOCARDIOGRAM COMPLETE  Result  Date: 10/12/2020    ECHOCARDIOGRAM REPORT   Patient Name:   Salah A Pelcher Date of Exam: 10/10/2020 Medical Rec #:  [id number]        Height:       60.0 in Accession #:    [id number]       Weight:       166.0 lb Date of Birth:  10/31/1929        BSA:          1.724 m Patient Age:    14 years         BP:           122/105 mmHg Patient Gender: F                HR:           126 bpm. Exam Location:  [facility] Procedure: 2D Echo Indications:     Atrial Fibrillation 427.31 / I48.91  History:         Patient has no prior history of Echocardiogram examinations.                  Arrythmias:Atrial Fibrillation; Risk Factors:Dyslipidemia.  Sonographer:     Avanell Shackleton Referring Phys:  [id number] AMY N COX Diagnosing Phys: Serafina Royals MD IMPRESSIONS  1. Left ventricular ejection fraction, by estimation, is 60 to 65%.  The left ventricle has normal function. The left ventricle has no regional wall motion abnormalities. Left ventricular diastolic parameters were normal.  2. Right ventricular systolic function is normal. The right ventricular size is normal.  3. The mitral valve is normal in structure. Mild mitral valve regurgitation.  4. The aortic valve is normal in structure. Aortic valve regurgitation is not visualized. FINDINGS  Left Ventricle: Left ventricular ejection fraction, by estimation, is 60 to 65%. The left ventricle has normal function. The left ventricle has no regional wall motion abnormalities. The left ventricular internal cavity size was normal in size. There is  no left ventricular hypertrophy. Left ventricular diastolic parameters were normal. Right Ventricle: The right ventricular size is normal. No increase in right ventricular wall thickness. Right ventricular systolic function is normal. Left Atrium: Left atrial size was normal in size. Right Atrium: Right atrial size was normal in size. Pericardium: There is no evidence of pericardial effusion. Mitral Valve: The mitral valve is normal in structure. Mild  mitral valve regurgitation. Tricuspid Valve: The tricuspid valve is normal in structure. Tricuspid valve regurgitation is trivial. Aortic Valve: The aortic valve is normal in structure. Aortic valve regurgitation is not visualized. Pulmonic Valve: The pulmonic valve was normal in structure. Pulmonic valve regurgitation is not visualized. Aorta: The aortic root and ascending aorta are structurally normal, with no evidence of dilitation. IAS/Shunts: No atrial level shunt detected by color flow Doppler.  LEFT VENTRICLE PLAX 2D LVIDd:         2.99 cm LVIDs:         1.75 cm LV PW:         1.03 cm LV IVS:        0.87 cm  RIGHT VENTRICLE             IVC RV S prime:     13.50 cm/s  IVC diam: 1.93 cm LEFT ATRIUM             Index LA diam:        3.70 cm 2.15 cm/m LA Vol (A2C):   59.3 ml 34.39 ml/m LA Vol (A4C):   69.1 ml 40.07 ml/m LA Biplane Vol: 65.6 ml 38.04 ml/m   AORTA Ao Root diam: 2.70 cm TRICUSPID VALVE TR Peak grad:   30.9 mmHg TR Vmax:        278.00 cm/s Serafina Royals MD Electronically signed by Serafina Royals MD Signature Date/Time: 10/12/2020/6:37:27 AM    Final      Assessment and Recommendation  86 y.o. female with known chronic kidney disease stage III hypertension hyperlipidemia with a acute onset of a fall and then additionally of right lower lobe pneumonia likely contributing to atrial fibrillation with rapid ventricular rate now converted spontaneously to normal sinus rhythm with diltiazem drip and converted to metoprolol at a low dose and maintaining normal sinus rhythm without evidence of evidence of further cardiovascular issues.  There is been no evidence of congestive heart failure acute coronary syndrome and/or myocardial infarction. 1.  Continue metoprolol at 12.5 mg twice per day for hypertension control and maintenance of normal sinus rhythm 2.  No anticoagulation at this time for atrial fibrillation due to very short period of time and the concerns for fall issues.  This will be  readdressed as an outpatient for need for additional medication management 3.  High intensity cholesterol therapy for previous lipid management 5.  No further cardiac intervention and/or diagnostics necessary at this time 5.  If ambulating well from  the cardiovascular standpoint and maintaining normal sinus rhythm the patient may be discharged home with follow-up in 1 week for further treatment options listed above  Signed, Serafina Royals M.D. FACC

## 2020-10-12 NOTE — Evaluation (Signed)
Physical Therapy Evaluation Patient Details Name: Olivia Lewis MRN: [id number] DOB: 30-Dec-1928 Today's Date: 10/12/2020   History of Present Illness  86 yo female with onset of falls and a-fib with RVR was admitted for care.  Pt has PNA with possible sepsis, and weakness, has been in bed several days. Cleared by imaging for an acute neuro event.   PMHx:  HLD, CKD3, HTN, constipation, hypothyroidism  Clinical Impression  Pt is up to walk with PT and assisted her to go down the hallway with monitoring of sats on room air.  Her plan is to get home with her sister, and will require follow up therapy for her limited endurance and chronic stiffness and pain in neck.  Pt is motivated to get home and is feeling comfortable that her sister will not physically require more help from her.  Follow as her admission allows for strength and balance training, and work toward her discharge goals of acute PT.    Follow Up Recommendations Home health PT;Supervision for mobility/OOB    Equipment Recommendations  None recommended by PT (has RW at home)    Recommendations for Other Services       Precautions / Restrictions Precautions Precautions: Fall Precaution Comments: monitor vitals Restrictions Weight Bearing Restrictions: No Other Position/Activity Restrictions: ck O2 sats      Mobility  Bed Mobility Overal bed mobility: Needs Assistance Bed Mobility: Supine to Sit;Sit to Supine     Supine to sit: Supervision Sit to supine: Supervision   General bed mobility comments: Supervised for safety    Transfers Overall transfer level: Needs assistance Equipment used: Rolling walker (2 wheeled);1 person hand held assist Transfers: Sit to/from Stand Sit to Stand: Supervision         General transfer comment: supervised for safety but can stand mod I  Ambulation/Gait Ambulation/Gait assistance: Min guard Gait Distance (Feet): 150 Feet Assistive device: Rolling walker (2 wheeled);1  person hand held assist Gait Pattern/deviations: Step-through pattern;Trunk flexed;Decreased stride length Gait velocity: reduced Gait velocity interpretation: <1.31 ft/sec, indicative of household ambulator General Gait Details: slow pace with sats checked, no lower than 92% and fluctuating  Stairs            Wheelchair Mobility    Modified Rankin (Stroke Patients Only)       Balance Overall balance assessment: Needs assistance Sitting-balance support: Feet supported Sitting balance-Leahy Scale: Good     Standing balance support: Bilateral upper extremity supported;During functional activity Standing balance-Leahy Scale: Fair Standing balance comment: less than fair dynamically, needs RW for gait                             Pertinent Vitals/Pain Pain Assessment: No/denies pain    Home Living Family/patient expects to be discharged to:: Private residence Living Arrangements: Other relatives Available Help at Discharge: Family;Available 24 hours/day Type of Home: House Home Access: Stairs to enter Entrance Stairs-Rails: Right Entrance Stairs-Number of Steps: 1 Home Layout: One level Home Equipment: Walker - 2 wheels;Cane - single point Additional Comments: has been walking with no AD    Prior Function Level of Independence: Independent         Comments: has been home with sister who has dementia,      Hand Dominance   Dominant Hand: Right    Extremity/Trunk Assessment   Upper Extremity Assessment Upper Extremity Assessment: Overall WFL for tasks assessed    Lower Extremity Assessment Lower Extremity Assessment: Generalized  weakness    Cervical / Trunk Assessment Cervical / Trunk Assessment: Kyphotic  Communication   Communication: No difficulties  Cognition Arousal/Alertness: Awake/alert Behavior During Therapy: WFL for tasks assessed/performed Overall Cognitive Status: Within Functional Limits for tasks assessed                                         General Comments General comments (skin integrity, edema, etc.): Pt was seen for mobility on RW, noted pregait sats were 96% and post 92% at the lowest.  Pt is not SOB but did take one standing rest break on the hallway    Exercises     Assessment/Plan    PT Assessment Patient needs continued PT services  PT Problem List Decreased strength;Decreased mobility;Cardiopulmonary status limiting activity       PT Treatment Interventions DME instruction;Gait training;Stair training;Functional mobility training;Therapeutic activities;Therapeutic exercise;Balance training;Neuromuscular re-education;Patient/family education    PT Goals (Current goals can be found in the Care Plan section)  Acute Rehab PT Goals Patient Stated Goal: to get home to her sister PT Goal Formulation: With patient Time For Goal Achievement: 10/26/20 Potential to Achieve Goals: Good    Frequency Min 2X/week   Barriers to discharge Decreased caregiver support has been her sister's caregiver but sister walks without help    Co-evaluation               AM-PAC PT "6 Clicks" Mobility  Outcome Measure Help needed turning from your back to your side while in a flat bed without using bedrails?: A Little Help needed moving from lying on your back to sitting on the side of a flat bed without using bedrails?: A Little Help needed moving to and from a bed to a chair (including a wheelchair)?: A Little Help needed standing up from a chair using your arms (e.g., wheelchair or bedside chair)?: A Little Help needed to walk in hospital room?: A Little Help needed climbing 3-5 steps with a railing? : A Little 6 Click Score: 18    End of Session Equipment Utilized During Treatment: Gait belt Activity Tolerance: Patient tolerated treatment well;Treatment limited secondary to medical complications (Comment) Patient left: in bed;with call bell/phone within reach;with bed alarm  set Nurse Communication: Mobility status PT Visit Diagnosis: Unsteadiness on feet (R26.81);Muscle weakness (generalized) (M62.81)    Time: 7893-8101 PT Time Calculation (min) (ACUTE ONLY): 34 min   Charges:   PT Evaluation $PT Eval Moderate Complexity: 1 Mod PT Treatments $Gait Training: 8-22 mins       Ramond Dial 10/12/2020, 1:20 PM  Mee Hives, PT MS Acute Rehab Dept. Number: [place] and [place]

## 2020-10-12 NOTE — Discharge Summary (Addendum)
[facility] NAME: Olivia Lewis    MR#:  [id number]  DATE OF BIRTH:  October 22, 1929  DATE OF ADMISSION:  10/10/2020 ADMITTING PHYSICIAN: Nita Sells, MD  DATE OF DISCHARGE: 10/12/2020  3:24 PM  PRIMARY CARE PHYSICIAN: Baxter Hire, MD    ADMISSION DIAGNOSIS:  Fall [W19.XXXA] Atrial fibrillation with rapid ventricular response (HCC) [I48.91] Atrial fibrillation (HCC) [I48.91]  DISCHARGE DIAGNOSIS:  Principal Problem:   Atrial fibrillation with rapid ventricular response (HCC) Active Problems:   Primary osteoarthritis of knee   Acquired hypothyroidism   Chronic kidney disease (CKD), stage III (moderate) (HCC)   HLD (hyperlipidemia)   Atrial fibrillation ([place])   SECONDARY DIAGNOSIS:   Past Medical History:  Diagnosis Date  . Allergy   . Anemia   . Arthritis   . B12 deficiency   . Cancer ([place])    skin--face and arms  . Cataracts, bilateral   . Chickenpox   . CKD (chronic kidney disease), stage III ([place])   . Diverticulosis   . GERD (gastroesophageal reflux disease)   . Hyperlipidemia   . Hypertension   . Hypothyroidism   . Osteoporosis     HOSPITAL COURSE:   1.  New onset atrial fibrillation, paroxysmal in nature..  Patient converted over to normal sinus rhythm.  Poor candidate for long-term anticoagulation with a fall and bruising on the right side of the face.  Can go back on aspirin at home.  Since blood pressure now on the higher side will increase metoprolol to 25 mg twice a day. 2.  Falls.  Did well with physical therapy and nursing staff walking around.  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  We will get physical therapy home health set up. 3.  Right shoulder pain.  Patient has difficulty initiating the movement but then when she is able to move it able to lift it up.  If persisting can be referred as outpatient orthopedic surgery.  Likely from the fall. 4.  Pneumonia ruled out repeat chest  x-ray negative and procalcitonin negative.  Discontinue antibiotics. 5.  Chronic leukocytosis follows up with Dr. Tasia Catchings oncology as outpatient 6.  Essential hypertension continue metoprolol 25 mg twice daily 7.  Acute kidney injury superimposed on chronic kidney disease stage IIIa.  Continue to check creatinine as outpatient. 8.  Hypothyroidism unspecified on levothyroxine 9.  Hyperlipidemia unspecified on atorvastatin 10.  Large bruising right side of the face can go back on aspirin alone as outpatient 11. Sepsis ruled out  DISCHARGE CONDITIONS:   Satisfactory  CONSULTS OBTAINED:  Cardiology  DRUG ALLERGIES:   Allergies  Allergen Reactions  . Penicillin V Potassium Other (See Comments)    Has patient had a PCN reaction causing immediate rash, facial/tongue/throat swelling, SOB or lightheadedness with hypotension: Unknown Has patient had a PCN reaction causing severe rash involving mucus membranes or skin necrosis: Unknown Has patient had a PCN reaction that required hospitalization: Unknown Has patient had a PCN reaction occurring within the last 10 years: No If all of the above answers are "NO", then may proceed with Cephalosporin use.   . Sulfa Antibiotics Other (See Comments)    headache    DISCHARGE MEDICATIONS:   Allergies as of 10/12/2020      Reactions   Penicillin V Potassium Other (See Comments)   Has patient had a PCN reaction causing immediate rash, facial/tongue/throat swelling, SOB or lightheadedness with hypotension: Unknown Has patient had a PCN reaction causing  severe rash involving mucus membranes or skin necrosis: Unknown Has patient had a PCN reaction that required hospitalization: Unknown Has patient had a PCN reaction occurring within the last 10 years: No If all of the above answers are "NO", then may proceed with Cephalosporin use.   Sulfa Antibiotics Other (See Comments)   headache      Medication List    STOP taking these medications    amLODipine-benazepril 5-40 MG capsule Commonly known as: LOTREL   metoprolol succinate 50 MG 24 hr tablet Commonly known as: TOPROL-XL     TAKE these medications   acetaminophen 500 MG tablet Commonly known as: TYLENOL Take 1,000 mg by mouth daily as needed.   aspirin EC 81 MG tablet Take 81 mg by mouth daily.   atorvastatin 10 MG tablet Commonly known as: LIPITOR Take 10 mg by mouth daily.   B-12 SL Place 2,500 mcg under the tongue daily.   EQL Oyster Shell Calcium/D 500-200 MG-UNIT Tabs Generic drug: Calcium Carb-Cholecalciferol Take 1 tablet by mouth daily.   levothyroxine 50 MCG tablet Commonly known as: SYNTHROID Take 50 mcg by mouth daily at 6 (six) AM.   loratadine 10 MG tablet Commonly known as: CLARITIN Take 10 mg by mouth daily as needed for allergies.   metoprolol tartrate 25 MG tablet Commonly known as: LOPRESSOR Take 1 tablet (25 mg total) by mouth 2 (two) times daily.   Stool Softener 100 MG capsule Generic drug: docusate sodium Take 100 mg by mouth daily as needed.            Durable Medical Equipment  (From admission, onward)         Start     Ordered   10/12/20 1400  For home use only DME Walker rolling  Once       Question Answer Comment  Walker: With 5 Inch Wheels   Patient needs a walker to treat with the following condition Unsteady gait when walking      10/12/20 1359           DISCHARGE INSTRUCTIONS:   Follow-up PMD 5 days Follow-up cardiology 1 week  If you experience worsening of your admission symptoms, develop shortness of breath, life threatening emergency, suicidal or homicidal thoughts you must seek medical attention immediately by calling 911 or calling your MD immediately  if symptoms less severe.  You Must read complete instructions/literature along with all the possible adverse reactions/side effects for all the Medicines you take and that have been prescribed to you. Take any new Medicines after you have  completely understood and accept all the possible adverse reactions/side effects.   Please note  You were cared for by a hospitalist during your hospital stay. If you have any questions about your discharge medications or the care you received while you were in the hospital after you are discharged, you can call the unit and asked to speak with the hospitalist on call if the hospitalist that took care of you is not available. Once you are discharged, your primary care physician will handle any further medical issues. Please note that NO REFILLS for any discharge medications will be authorized once you are discharged, as it is imperative that you return to your primary care physician (or establish a relationship with a primary care physician if you do not have one) for your aftercare needs so that they can reassess your need for medications and monitor your lab values.    Today   CHIEF COMPLAINT:  Chief Complaint  Patient presents with  . Tachycardia    HISTORY OF PRESENT ILLNESS:  Olivia Lewis  is a 86 y.o. female came in with atrial fibrillation rapid ventricular response   VITAL SIGNS:  Blood pressure (!) 163/91, pulse 97, temperature 97.8 F (36.6 C), temperature source Oral, resp. rate 18, height 5' (1.524 m), weight 76.2 kg, SpO2 96 %.  I/O:    Intake/Output Summary (Last 24 hours) at 10/12/2020 1653 Last data filed at 10/12/2020 1508 Gross per 24 hour  Intake 600 ml  Output 850 ml  Net -250 ml    PHYSICAL EXAMINATION:  GENERAL:  86 y.o.-year-old patient lying in the bed with no acute distress.  EYES: Pupils equal, round, reactive to light and accommodation. No scleral icterus. Extraocular muscles intact.  HEENT: Head atraumatic, normocephalic. Oropharynx and nasopharynx clear.   LUNGS: Normal breath sounds bilaterally, no wheezing, rales,rhonchi or crepitation. No use of accessory muscles of respiration.  CARDIOVASCULAR: S1, S2 normal. No murmurs, rubs, or gallops.   ABDOMEN: Soft, non-tender. EXTREMITIES: No pedal edema.  NEUROLOGIC: Cranial nerves II through XII are intact. Muscle strength 5/5 in all extremities. Sensation intact. Gait not checked.  PSYCHIATRIC: The patient is alert and oriented x 3.  SKIN: Left arm skin tear and bruising left arm, bruising right side of the face.  DATA REVIEW:   CBC Recent Labs  Lab 10/12/20 0439  WBC 12.9*  HGB 11.6*  HCT 34.1*  PLT 278    Chemistries  Recent Labs  Lab 10/10/20 1130 10/11/20 0438 10/12/20 0439  NA 140   < > 135  K 4.0   < > 3.8  CL 105   < > 107  CO2 24   < > 24  GLUCOSE 112*   < > 112*  BUN 41*   < > 25*  CREATININE 1.40*   < > 1.03*  CALCIUM 9.8   < > 8.6*  MG 2.1  --   --   AST 40  --  19  ALT 22  --  15  ALKPHOS 42  --  31*  BILITOT 1.1  --  0.8   < > = values in this interval not displayed.     Microbiology Results  Results for orders placed or performed during the hospital encounter of 10/10/20  Resp Panel by RT-PCR (Flu A&B, Covid) Nasopharyngeal Swab     Status: None   Collection Time: 10/10/20 11:29 AM   Specimen: Nasopharyngeal Swab; Nasopharyngeal(NP) swabs in vial transport medium  Result Value Ref Range Status   SARS Coronavirus 2 by RT PCR NEGATIVE NEGATIVE Final    Comment: (NOTE) SARS-CoV-2 target nucleic acids are NOT DETECTED.  The SARS-CoV-2 RNA is generally detectable in upper respiratory specimens during the acute phase of infection. The lowest concentration of SARS-CoV-2 viral copies this assay can detect is 138 copies/mL. A negative result does not preclude SARS-Cov-2 infection and should not be used as the sole basis for treatment or other patient management decisions. A negative result may occur with  improper specimen collection/handling, submission of specimen other than nasopharyngeal swab, presence of viral mutation(s) within the areas targeted by this assay, and inadequate number of viral copies(<138 copies/mL). A negative result must  be combined with clinical observations, patient history, and epidemiological information. The expected result is Negative.  Fact Sheet for Patients:  EntrepreneurPulse.com.au  Fact Sheet for Healthcare Providers:  IncredibleEmployment.be  This test is no t yet approved or cleared by the [place]  FDA and  has been authorized for detection and/or diagnosis of SARS-CoV-2 by FDA under an Emergency Use Authorization (EUA). This EUA will remain  in effect (meaning this test can be used) for the duration of the COVID-19 declaration under Section 564(b)(1) of the Act, 21 U.S.C.section 360bbb-3(b)(1), unless the authorization is terminated  or revoked sooner.       Influenza A by PCR NEGATIVE NEGATIVE Final   Influenza B by PCR NEGATIVE NEGATIVE Final    Comment: (NOTE) The Xpert Xpress SARS-CoV-2/FLU/RSV plus assay is intended as an aid in the diagnosis of influenza from Nasopharyngeal swab specimens and should not be used as a sole basis for treatment. Nasal washings and aspirates are unacceptable for Xpert Xpress SARS-CoV-2/FLU/RSV testing.  Fact Sheet for Patients: EntrepreneurPulse.com.au  Fact Sheet for Healthcare Providers: IncredibleEmployment.be  This test is not yet approved or cleared by the [place] FDA and has been authorized for detection and/or diagnosis of SARS-CoV-2 by FDA under an Emergency Use Authorization (EUA). This EUA will remain in effect (meaning this test can be used) for the duration of the COVID-19 declaration under Section 564(b)(1) of the Act, 21 U.S.C. section 360bbb-3(b)(1), unless the authorization is terminated or revoked.  Performed at [facility], [address] [place], [place] [address]   Blood culture (routine x 2)     Status: None (Preliminary result)   Collection Time: 10/10/20  1:01 PM   Specimen: BLOOD  Result Value Ref Range Status    Specimen Description BLOOD RIGHT ANTECUBITAL  Final   Special Requests   Final    BOTTLES DRAWN AEROBIC AND ANAEROBIC Blood Culture adequate volume   Culture   Final    NO GROWTH 2 DAYS Performed at [facility], [address] [place], [place] [address]    Report Status PENDING  Incomplete  Blood culture (routine x 2)     Status: None (Preliminary result)   Collection Time: 10/10/20  1:01 PM   Specimen: BLOOD  Result Value Ref Range Status   Specimen Description BLOOD LEFT ANTECUBITAL  Final   Special Requests   Final    BOTTLES DRAWN AEROBIC AND ANAEROBIC Blood Culture adequate volume   Culture   Final    NO GROWTH 2 DAYS Performed at [facility], [address] [place], [place] [address]    Report Status PENDING  Incomplete    RADIOLOGY:  DG Chest 2 View  Result Date: 10/12/2020 CLINICAL DATA:  86 year old female status post fall. Atrial fibrillation. EXAM: CHEST - 2 VIEW COMPARISON:  Portable chest 10/10/2020. FINDINGS: Seated AP and lateral views. Stable tortuosity of the thoracic aorta. Mild-to-moderate cardiomegaly. Other mediastinal contours are within normal limits. Visualized tracheal air column is within normal limits. No pneumothorax, pulmonary edema or consolidation. However evidence of small bilateral pleural effusions more apparent on the left. Osteopenia. Degenerative changes in the spine. No acute osseous abnormality identified. Negative visible bowel gas pattern. IMPRESSION: 1. Small bilateral pleural effusions, more apparent on the left. 2. Cardiomegaly. 3. No other acute cardiopulmonary abnormality. Electronically Signed   By: Genevie Ann M.D.   On: 10/12/2020 07:58   CT HEAD WO CONTRAST  Result Date: 10/10/2020 CLINICAL DATA:  Facial trauma after fall. EXAM: CT HEAD WITHOUT CONTRAST CT MAXILLOFACIAL WITHOUT CONTRAST TECHNIQUE: Multidetector CT imaging of the head and maxillofacial structures were performed using the standard protocol without  intravenous contrast. Multiplanar CT image reconstructions of the maxillofacial structures were also generated. COMPARISON:  10/09/2019 FINDINGS: CT  HEAD FINDINGS Brain: Ventricles, cisterns and other CSF spaces are within normal as there is minimal age related atrophic change present. There is chronic ischemic microvascular disease. There is no mass, mass effect, shift of midline structures or acute hemorrhage. No evidence of acute infarction. Vascular: No hyperdense vessel or unexpected calcification. Skull: There is a scalp contusion over the midline high frontal scalp and minimally over the right lower frontal scalp. No evidence of fracture. Other: None. CT MAXILLOFACIAL FINDINGS Osseous: No acute fracture. No focal bony abnormality. Moderate degenerative changes of the temporomandibular joints bilaterally. Left lower dental carie. Orbits: Globes are normal and symmetric. Retrobulbar spaces are normal. There is mild soft tissue swelling over the right periorbital region. Sinuses: Paranasal sinuses are well aerated without air-fluid level. Ostiomeatal complexes are patent. Mastoid air cells are clear. Soft tissues: Minimal soft tissue swelling over the lower right frontal scalp as well as lateral/inferior right periorbital region. IMPRESSION: 1. No acute brain injury. 2. Chronic ischemic microvascular disease and mild age related atrophic change. 3. No acute facial bone fracture. 4. Mild soft tissue swelling over the right periorbital region and lower right frontal scalp. Electronically Signed   By: Marin Olp M.D.   On: 10/10/2020 19:51   CT MAXILLOFACIAL W CONTRAST  Result Date: 10/10/2020 CLINICAL DATA:  Facial trauma after fall. EXAM: CT HEAD WITHOUT CONTRAST CT MAXILLOFACIAL WITHOUT CONTRAST TECHNIQUE: Multidetector CT imaging of the head and maxillofacial structures were performed using the standard protocol without intravenous contrast. Multiplanar CT image reconstructions of the maxillofacial  structures were also generated. COMPARISON:  10/09/2019 FINDINGS: CT HEAD FINDINGS Brain: Ventricles, cisterns and other CSF spaces are within normal as there is minimal age related atrophic change present. There is chronic ischemic microvascular disease. There is no mass, mass effect, shift of midline structures or acute hemorrhage. No evidence of acute infarction. Vascular: No hyperdense vessel or unexpected calcification. Skull: There is a scalp contusion over the midline high frontal scalp and minimally over the right lower frontal scalp. No evidence of fracture. Other: None. CT MAXILLOFACIAL FINDINGS Osseous: No acute fracture. No focal bony abnormality. Moderate degenerative changes of the temporomandibular joints bilaterally. Left lower dental carie. Orbits: Globes are normal and symmetric. Retrobulbar spaces are normal. There is mild soft tissue swelling over the right periorbital region. Sinuses: Paranasal sinuses are well aerated without air-fluid level. Ostiomeatal complexes are patent. Mastoid air cells are clear. Soft tissues: Minimal soft tissue swelling over the lower right frontal scalp as well as lateral/inferior right periorbital region. IMPRESSION: 1. No acute brain injury. 2. Chronic ischemic microvascular disease and mild age related atrophic change. 3. No acute facial bone fracture. 4. Mild soft tissue swelling over the right periorbital region and lower right frontal scalp. Electronically Signed   By: Marin Olp M.D.   On: 10/10/2020 19:51   ECHOCARDIOGRAM COMPLETE  Result Date: 10/12/2020    ECHOCARDIOGRAM REPORT   Patient Name:   Olivia Lewis Date of Exam: 10/10/2020 Medical Rec #:  [id number]        Height:       60.0 in Accession #:    [id number]       Weight:       166.0 lb Date of Birth:  05-Jan-1929        BSA:          1.724 m Patient Age:    46 years         BP:  122/105 mmHg Patient Gender: F                HR:           126 bpm. Exam Location:  [facility] Procedure: 2D  Echo Indications:     Atrial Fibrillation 427.31 / I48.91  History:         Patient has no prior history of Echocardiogram examinations.                  Arrythmias:Atrial Fibrillation; Risk Factors:Dyslipidemia.  Sonographer:     Avanell Shackleton Referring Phys:  [id number] AMY N COX Diagnosing Phys: Serafina Royals MD IMPRESSIONS  1. Left ventricular ejection fraction, by estimation, is 60 to 65%. The left ventricle has normal function. The left ventricle has no regional wall motion abnormalities. Left ventricular diastolic parameters were normal.  2. Right ventricular systolic function is normal. The right ventricular size is normal.  3. The mitral valve is normal in structure. Mild mitral valve regurgitation.  4. The aortic valve is normal in structure. Aortic valve regurgitation is not visualized. FINDINGS  Left Ventricle: Left ventricular ejection fraction, by estimation, is 60 to 65%. The left ventricle has normal function. The left ventricle has no regional wall motion abnormalities. The left ventricular internal cavity size was normal in size. There is  no left ventricular hypertrophy. Left ventricular diastolic parameters were normal. Right Ventricle: The right ventricular size is normal. No increase in right ventricular wall thickness. Right ventricular systolic function is normal. Left Atrium: Left atrial size was normal in size. Right Atrium: Right atrial size was normal in size. Pericardium: There is no evidence of pericardial effusion. Mitral Valve: The mitral valve is normal in structure. Mild mitral valve regurgitation. Tricuspid Valve: The tricuspid valve is normal in structure. Tricuspid valve regurgitation is trivial. Aortic Valve: The aortic valve is normal in structure. Aortic valve regurgitation is not visualized. Pulmonic Valve: The pulmonic valve was normal in structure. Pulmonic valve regurgitation is not visualized. Aorta: The aortic root and ascending aorta are structurally normal, with no  evidence of dilitation. IAS/Shunts: No atrial level shunt detected by color flow Doppler.  LEFT VENTRICLE PLAX 2D LVIDd:         2.99 cm LVIDs:         1.75 cm LV PW:         1.03 cm LV IVS:        0.87 cm  RIGHT VENTRICLE             IVC RV S prime:     13.50 cm/s  IVC diam: 1.93 cm LEFT ATRIUM             Index LA diam:        3.70 cm 2.15 cm/m LA Vol (A2C):   59.3 ml 34.39 ml/m LA Vol (A4C):   69.1 ml 40.07 ml/m LA Biplane Vol: 65.6 ml 38.04 ml/m   AORTA Ao Root diam: 2.70 cm TRICUSPID VALVE TR Peak grad:   30.9 mmHg TR Vmax:        278.00 cm/s Serafina Royals MD Electronically signed by Serafina Royals MD Signature Date/Time: 10/12/2020/6:37:27 AM    Final     Management plans discussed with the patient, and she is in agreement.  Patient deferred me calling family.  CODE STATUS:     Code Status Orders  (From admission, onward)         Start     Ordered   10/10/20 1528  Full code  Continuous        10/10/20 1532        Code Status History    Date Active Date Inactive Code Status Order ID Comments User Context   05/06/2015 1327 05/09/2015 1720 Full Code [id number]  Hessie Knows, MD Inpatient   Advance Care Planning Activity    Advance Directive Documentation     Most Recent Value  Type of Advance Directive Healthcare Power of Attorney, Living will  Pre-existing out of facility DNR order (yellow form or pink MOST form) --  "MOST" Form in Place? --      TOTAL TIME TAKING CARE OF THIS PATIENT: 35 minutes.    Loletha Grayer M.D on 10/12/2020 at 4:53 PM  Between 7am to 6pm - Pager - [phone]  After 6pm go to [email or web address] - password [email or web address]  [facility]  CC: Primary care physician; Baxter Hire, MD

## 2020-10-12 NOTE — Progress Notes (Signed)
Patient discharged to home.  Tele and IV d/c'd. Patient given walker prior to discharge.  RN to see patient tomorrow and PT will be to see patient on 11/24 per case manager.  Dressing changed on L forearm skin tear prior to discharge. Verbalizes understanding of discharge instructions.

## 2020-10-12 NOTE — TOC Progression Note (Signed)
Transition of Care [facility]) - Progression Note    Patient Details  Name: JULIANA BOLING MRN: [id number] Date of Birth: 04/03/1929  [facility]) CM/SW Contact  Izola Price, RN Phone Number: 10/12/2020, 1:12 PM  Clinical Narrative:     Pt admitted with new onset Afib RVR 10/10/20.   Provider discharging if [facility] PT can be arranged today.   Attempting to find out about home situation.   Attempted to reach patient by phone no answer.   Attempted to call Sister, but she could not really understand me or was a bit worried about call and hung up even though RN CM identified self and purpose.   Pt recomended HH PT for weakness.   RN states that patient requests a different walker as hers at home is bulky.   Suggested RN contact PT that did evaluation for DME recommendation.   Attempting to find [facility] PT agency before discharge to home per PT recommendation.   [facility] declined.   Waiting to hear from [facility] and others. Will continue to assist/monitor.  Simmie Davies RN CM        Expected Discharge Plan and Services           Expected Discharge Date: 10/12/20                                     Social Determinants of Health (SDOH) Interventions    Readmission Risk Interventions No flowsheet data found.

## 2020-10-12 NOTE — TOC Progression Note (Addendum)
Transition of Care [facility]) - Progression Note    Patient Details  Name: ASPYNN CLOVER MRN: [id number] Date of Birth: [birth year]  [facility]) CM/SW Contact  Izola Price, RN Phone Number: 10/12/2020, 2:03 PM  Clinical Narrative:    9574 [facility] orders in for [facility] RN and DME RW. Asked RN to have PT adjust for patient and educate/assess use given htx of recent falls before discharge. Simmie Davies RN CM  11/21 1400 [facility] accepted patient per Floydene Flock. [place] [place] PT services will start Wednesday. [facility] RN services add and will be seen tomorrow 11/22. Notified provider via chat message for service start dates for PT and RN services. Waiting on PT DME recommendation and order additions. Simmie Davies RN CM         Expected Discharge Plan and Services           Expected Discharge Date: 10/12/20                                     Social Determinants of Health (SDOH) Interventions    Readmission Risk Interventions No flowsheet data found.

## 2020-10-12 NOTE — TOC Transition Note (Addendum)
Transition of Care [facility]) - CM/SW Discharge Note   Patient Details  Name: Olivia Lewis MRN: [id number] Date of Birth: 18-Feb-1929  Transition of Care [facility]) CM/SW Contact:  Izola Price, RN Phone Number: 10/12/2020, 2:31 PM   Clinical Narrative:   11/21 1450. RN notified CM that PT adjusted RW and will document HH PT and RN start dates as well. Calling patient's ride, which is a friend. Simmie Davies RN CM    PT evaluation completed.  Requested that DME added after evaluation be adjusted by PT and assess/educate on use prior to discharge if at all possible given htx of recent falls PTA.   Pt to be disharged home today with HH PT and [facility] RN service by [facility].   RW DME will be delivered by [employer] to be delivered asap for dc today.   [place] PT will begin 11/24 as stated and [facility] RN will start Monday, 11/22 per [facility].   Provider messaged to add service start dates in the [facility] order per Corene Cornea at [place] to make sure aware of start dates.   Pt anxious to be discharged today.   CM will sign off per discharge but monitor for completion of discharge needs.   Simmie Davies RN CM      Final next level of care: [place] Barriers to Discharge: Barriers Resolved   Patient Goals and CMS Choice        Discharge Placement                       Discharge Plan and Services                DME Arranged: Walker rolling DME Agency: Other - Comment ([employer] accepted.) Date DME Agency Contacted: 10/12/20 Time DME Agency Contacted: 1751 Representative spoke with at DME Agency: Brenton Grills HH Arranged: RN, PT [place] Agency: [place] ([place]) Date [place]: 10/12/20 Time [place]: 0258 Representative spoke with at [place]: Floydene Flock Accepted (Start for PT 11/24 Start for RN 11/22. Provider aware.)  Social Determinants of Health (SDOH) Interventions     Readmission Risk Interventions No flowsheet  data found.

## 2020-10-12 NOTE — Discharge Instructions (Signed)

## 2020-10-12 NOTE — Progress Notes (Signed)
Patient ambulated in hall with this RN with wheeled walker on RA.  Noted to have some weakness in legs that patient states is newer but that she has been in bed for the past few days.  Prior to ambulation, O2 sat 98%, post ambulation O2 93%.  Patient states she did feel SOB when activity.  Dr. Leslye Peer aware. PT ordered and patient would like to see them before she is discharged.

## 2020-10-15 LAB — CULTURE, BLOOD (ROUTINE X 2)
Culture: NO GROWTH
Culture: NO GROWTH
Special Requests: ADEQUATE
Special Requests: ADEQUATE

## 2020-10-21 DIAGNOSIS — I480 Paroxysmal atrial fibrillation: Secondary | ICD-10-CM | POA: Insufficient documentation

## 2020-12-03 ENCOUNTER — Emergency Department: Payer: Medicare Other

## 2020-12-03 ENCOUNTER — Encounter: Payer: Self-pay | Admitting: Emergency Medicine

## 2020-12-03 ENCOUNTER — Other Ambulatory Visit: Payer: Self-pay

## 2020-12-03 ENCOUNTER — Emergency Department
Admission: EM | Admit: 2020-12-03 | Discharge: 2020-12-03 | Disposition: A | Discharge status: Home / Self Care | Payer: Medicare Other | Attending: Emergency Medicine | Admitting: Emergency Medicine

## 2020-12-03 DIAGNOSIS — E039 Hypothyroidism, unspecified: Secondary | ICD-10-CM | POA: Diagnosis not present

## 2020-12-03 DIAGNOSIS — I129 Hypertensive chronic kidney disease with stage 1 through stage 4 chronic kidney disease, or unspecified chronic kidney disease: Secondary | ICD-10-CM | POA: Diagnosis not present

## 2020-12-03 DIAGNOSIS — Z7982 Long term (current) use of aspirin: Secondary | ICD-10-CM | POA: Insufficient documentation

## 2020-12-03 DIAGNOSIS — N183 Chronic kidney disease, stage 3 unspecified: Secondary | ICD-10-CM | POA: Diagnosis not present

## 2020-12-03 DIAGNOSIS — S2241XA Multiple fractures of ribs, right side, initial encounter for closed fracture: Secondary | ICD-10-CM | POA: Insufficient documentation

## 2020-12-03 DIAGNOSIS — Y92009 Unspecified place in unspecified non-institutional (private) residence as the place of occurrence of the external cause: Secondary | ICD-10-CM | POA: Diagnosis not present

## 2020-12-03 DIAGNOSIS — Z96652 Presence of left artificial knee joint: Secondary | ICD-10-CM | POA: Diagnosis not present

## 2020-12-03 DIAGNOSIS — S299XXA Unspecified injury of thorax, initial encounter: Secondary | ICD-10-CM | POA: Diagnosis present

## 2020-12-03 DIAGNOSIS — W01198A Fall on same level from slipping, tripping and stumbling with subsequent striking against other object, initial encounter: Secondary | ICD-10-CM | POA: Diagnosis not present

## 2020-12-03 DIAGNOSIS — Z955 Presence of coronary angioplasty implant and graft: Secondary | ICD-10-CM | POA: Insufficient documentation

## 2020-12-03 DIAGNOSIS — Z85828 Personal history of other malignant neoplasm of skin: Secondary | ICD-10-CM | POA: Insufficient documentation

## 2020-12-03 DIAGNOSIS — Z79899 Other long term (current) drug therapy: Secondary | ICD-10-CM | POA: Insufficient documentation

## 2020-12-03 DIAGNOSIS — W19XXXA Unspecified fall, initial encounter: Secondary | ICD-10-CM

## 2020-12-03 MED ORDER — TRAMADOL HCL 50 MG PO TABS
50.0000 mg | ORAL_TABLET | Freq: Four times a day (QID) | ORAL | 0 refills | Status: DC | PRN
Start: 2020-12-03 — End: 2023-07-11

## 2020-12-03 MED ORDER — LIDOCAINE 5 % EX PTCH: 1.0000 | MEDICATED_PATCH | Freq: Two times a day (BID) | CUTANEOUS | 0 refills | Status: AC

## 2020-12-03 MED ORDER — LIDOCAINE 5 % EX PTCH
1.0000 | MEDICATED_PATCH | CUTANEOUS | Status: DC
  Administered 2020-12-03: 1 via TRANSDERMAL
  Filled 2020-12-03: qty 1

## 2020-12-03 MED ORDER — TRAMADOL HCL 50 MG PO TABS
50.0000 mg | ORAL_TABLET | Freq: Once | ORAL | Status: AC
  Administered 2020-12-03: 50 mg via ORAL
  Filled 2020-12-03: qty 1

## 2020-12-03 NOTE — ED Triage Notes (Signed)
Arrives via [facility] from home.  Per report, patient lost balance and slipped and fell this morning.  C/O right rib pain.  VS wnl.

## 2020-12-03 NOTE — Discharge Instructions (Addendum)
Use your incentive spirometer every 3-4 hours while awake. Move around as much as possible. Follow up with your primary care provider in about a week. Return to the ER for symptoms that change, worsen, or for new concerns.

## 2020-12-03 NOTE — ED Provider Notes (Signed)
[facility] Emergency Department Provider Note ____________________________________________  Time seen: Approximately 8:07 AM  I have reviewed the triage vital signs and the nursing notes.   HISTORY  Chief Complaint Fall    HPI Olivia Lewis is a 87 y.o. female who presents to the emergency department for evaluation and treatment of treatment and evaluation after mechanical, nonsyncopal fall this morning.  Patient states that she was trying to get out of bed to go to the bathroom and she had her walker by the bedside she had stood up to put her shoes on and while putting her shoes on sort of lost her balance and fell towards her right side.  She thinks that she hit her TV or TV stand.  She denies striking her head or having loss of consciousness.  She states that it "knocked the wind out of me for a minute."  She denies any additional pain or injury is related to the fall this morning.  No alleviating measures attempted prior to arrival.   Past Medical History:  Diagnosis Date  . Allergy   . Anemia   . Arthritis   . B12 deficiency   . Cancer ([place])    skin--face and arms  . Cataracts, bilateral   . Chickenpox   . CKD (chronic kidney disease), stage III ([place])   . Diverticulosis   . GERD (gastroesophageal reflux disease)   . Hyperlipidemia   . Hypertension   . Hypothyroidism   . Osteoporosis     Patient Active Problem List   Diagnosis Date Noted  . Fall   . Leukocytosis   . Acute kidney injury superimposed on CKD ([place])   . Atrial fibrillation ([place]) 10/11/2020  . Atrial fibrillation with rapid ventricular response ([place]) 10/10/2020  . B12 deficiency 04/07/2016  . Acid reflux 04/07/2016  . HLD (hyperlipidemia) 04/07/2016  . Essential hypertension 04/07/2016  . Arthritis, degenerative 04/07/2016  . OP (osteoporosis) 04/07/2016  . Primary osteoarthritis of knee 05/06/2015  . Hypothyroidism 02/25/2015  . Chronic kidney disease (CKD), stage III  (moderate) ([place]) 02/25/2015  . Calcium blood increased 02/25/2015  . Baker's cyst of knee 05/30/2014    Past Surgical History:  Procedure Laterality Date  . BUNIONECTOMY Bilateral   . CARDIAC CATHETERIZATION    . CATARACT EXTRACTION Bilateral   . DILATION AND CURETTAGE OF UTERUS    . EYE SURGERY Bilateral    cataract  surgery  . JOINT REPLACEMENT Right    total knee replacement  . meniscus tear Right   . multiple skin cancer removal    . NEUROMA SURGERY Bilateral    bilateral feet  . rotator cuff surgery Right   . TOTAL KNEE ARTHROPLASTY Left 05/06/2015   Procedure: TOTAL KNEE ARTHROPLASTY;  Surgeon: Hessie Knows, MD;  Location: [facility] ORS;  Service: Orthopedics;  Laterality: Left;    Prior to Admission medications   Medication Sig Start Date End Date Taking? Authorizing Provider  lidocaine (LIDODERM) 5 % Place 1 patch onto the skin every 12 (twelve) hours. Remove & Discard patch within 12 hours or as directed by MD 12/03/20 12/03/21 Yes Kayron Hicklin B, FNP  traMADol (ULTRAM) 50 MG tablet Take 1 tablet (50 mg total) by mouth every 6 (six) hours as needed. 12/03/20  Yes Demitria Hay B, FNP  acetaminophen (TYLENOL) 500 MG tablet Take 1,000 mg by mouth daily as needed.     [provider]  aspirin EC 81 MG tablet Take 81 mg by mouth daily.  [provider]  atorvastatin (LIPITOR) 10 MG tablet Take 10 mg by mouth daily.     [provider]  Calcium Carb-Cholecalciferol (EQL OYSTER SHELL CALCIUM/D) 500-200 MG-UNIT TABS Take 1 tablet by mouth daily.     [provider]  Cyanocobalamin (B-12 SL) Place 2,500 mcg under the tongue daily.     [provider]  docusate sodium (STOOL SOFTENER) 100 MG capsule Take 100 mg by mouth daily as needed.     [provider]  levothyroxine (SYNTHROID, LEVOTHROID) 50 MCG tablet Take 50 mcg by mouth daily at 6 (six) AM.    [provider]  loratadine (CLARITIN) 10 MG tablet Take 10 mg by mouth  daily as needed for allergies.     [provider]  metoprolol tartrate (LOPRESSOR) 25 MG tablet Take 1 tablet (25 mg total) by mouth 2 (two) times daily. 10/12/20   Loletha Grayer, MD    Allergies Penicillin v potassium and Sulfa antibiotics  Family History  Problem Relation Age of Onset  . Hypertension Mother   . Hypertension Father   . Diabetes Sister   . Breast cancer Sister   . Colon cancer Sister   . Bone cancer Brother     Social History Social History   Tobacco Use  . Smoking status: Never Smoker  . Smokeless tobacco: Never Used  Vaping Use  . Vaping Use: Never used  Substance Use Topics  . Alcohol use: No  . Drug use: No    Review of Systems Constitutional: Negative for fever. Cardiovascular: Negative for chest pain. Respiratory: Negative for shortness of breath. Musculoskeletal: Positive for right-sided rib pain. Skin: Negative for open wound overlying the right side. Neurological: Negative for decrease in sensation  ____________________________________________   PHYSICAL EXAM:  VITAL SIGNS: ED Triage Vitals  Enc Vitals Group     BP 12/03/20 0738 (!) 166/106     Pulse Rate 12/03/20 0738 65     Resp 12/03/20 0738 18     Temp 12/03/20 0738 98.1 F (36.7 C)     Temp Source 12/03/20 0738 Oral     SpO2 12/03/20 0738 97 %     Weight 12/03/20 0737 167 lb 15.9 oz (76.2 kg)     Height 12/03/20 0737 5' (1.524 m)     Head Circumference --      Peak Flow --      Pain Score 12/03/20 0737 5     Pain Loc --      Pain Edu? --      Excl. in [place]? --     Constitutional: Alert and oriented. Well appearing and in no acute distress. Eyes: Conjunctivae are clear without discharge or drainage Head: Atraumatic Neck: Cervical kyphosis. No focal tenderness. Respiratory: No cough. Respirations are even and unlabored. Musculoskeletal: Diffuse lateral right rib pain. Neurologic: Awake, alert, oriented.  Skin: No open wounds over the right lateral thorax.   Psychiatric: Affect and behavior are appropriate.  ____________________________________________   LABS (all labs ordered are listed, but only abnormal results are displayed)  Labs Reviewed - No data to display ____________________________________________  RADIOLOGY  Fractures of the anterior right 5,6,7 ribs without pneumothorax or effusion.   I, Sherrie George, personally viewed and evaluated these images (plain radiographs) as part of my medical decision making, as well as reviewing the written report by the radiologist.  No results found. ____________________________________________   PROCEDURES  Procedures  ____________________________________________   INITIAL IMPRESSION / ASSESSMENT AND PLAN / ED COURSE  Olivia Lewis is a 87 y.o. who presents to the emergency department for right-sided rib pain after mechanical, nonsyncopal fall at home.  See HPI for further details.  Plan will be to get images of the right ribs and chest.  She will be given tramadol to help with pain.  Chest image does show 3 consecutive rib fractures without significant displacement. She is tolerating the pain well. Plan will be to instruct her on incentive spirometry and have her follow up with primary care in 1 week for recheck or sooner. She was given strict ER return precautions as well.  Patient instructed to follow-up with primary care  In 1 week for recheck.  She was also instructed to return to the emergency department for symptoms that change or worsen if unable schedule an appointment with orthopedics or primary care.  Medications  traMADol (ULTRAM) tablet 50 mg (50 mg Oral Given 12/03/20 0347)    Pertinent labs & imaging results that were available during my care of the patient were reviewed by me and considered in my medical decision making (see chart for details).   _________________________________________   FINAL CLINICAL IMPRESSION(S) / ED DIAGNOSES  Final diagnoses:  Fall in  home, initial encounter  Closed fracture of multiple ribs of right side, initial encounter    ED Discharge Orders         Ordered    traMADol (ULTRAM) 50 MG tablet  Every 6 hours PRN        12/03/20 0936    lidocaine (LIDODERM) 5 %  Every 12 hours        12/03/20 0936           If controlled substance prescribed during this visit, 12 month history viewed on the [place] prior to issuing an initial prescription for Schedule II or III opiod.   Victorino Dike, FNP 12/04/20 1656    Carrie Mew, MD 12/04/20 2257

## 2020-12-03 NOTE — ED Notes (Signed)
Pt assisted to bathroom, linens changed . NP aware of dc BP

## 2021-01-01 ENCOUNTER — Other Ambulatory Visit: Payer: Self-pay

## 2021-01-01 ENCOUNTER — Emergency Department
Admission: EM | Admit: 2021-01-01 | Discharge: 2021-01-01 | Disposition: A | Discharge status: Home / Self Care | Payer: Medicare Other | Attending: Emergency Medicine | Admitting: Emergency Medicine

## 2021-01-01 ENCOUNTER — Emergency Department: Payer: Medicare Other

## 2021-01-01 ENCOUNTER — Encounter: Payer: Self-pay | Admitting: *Deleted

## 2021-01-01 DIAGNOSIS — S2241XD Multiple fractures of ribs, right side, subsequent encounter for fracture with routine healing: Secondary | ICD-10-CM | POA: Diagnosis not present

## 2021-01-01 DIAGNOSIS — N183 Chronic kidney disease, stage 3 unspecified: Secondary | ICD-10-CM | POA: Insufficient documentation

## 2021-01-01 DIAGNOSIS — W19XXXA Unspecified fall, initial encounter: Secondary | ICD-10-CM

## 2021-01-01 DIAGNOSIS — Y9389 Activity, other specified: Secondary | ICD-10-CM | POA: Insufficient documentation

## 2021-01-01 DIAGNOSIS — Y92009 Unspecified place in unspecified non-institutional (private) residence as the place of occurrence of the external cause: Secondary | ICD-10-CM | POA: Diagnosis not present

## 2021-01-01 DIAGNOSIS — Z85828 Personal history of other malignant neoplasm of skin: Secondary | ICD-10-CM | POA: Insufficient documentation

## 2021-01-01 DIAGNOSIS — W01198A Fall on same level from slipping, tripping and stumbling with subsequent striking against other object, initial encounter: Secondary | ICD-10-CM | POA: Insufficient documentation

## 2021-01-01 DIAGNOSIS — Z7982 Long term (current) use of aspirin: Secondary | ICD-10-CM | POA: Diagnosis not present

## 2021-01-01 DIAGNOSIS — E039 Hypothyroidism, unspecified: Secondary | ICD-10-CM | POA: Insufficient documentation

## 2021-01-01 DIAGNOSIS — Z79899 Other long term (current) drug therapy: Secondary | ICD-10-CM | POA: Diagnosis not present

## 2021-01-01 DIAGNOSIS — I129 Hypertensive chronic kidney disease with stage 1 through stage 4 chronic kidney disease, or unspecified chronic kidney disease: Secondary | ICD-10-CM | POA: Diagnosis not present

## 2021-01-01 DIAGNOSIS — Z96653 Presence of artificial knee joint, bilateral: Secondary | ICD-10-CM | POA: Diagnosis not present

## 2021-01-01 DIAGNOSIS — S0003XA Contusion of scalp, initial encounter: Secondary | ICD-10-CM | POA: Insufficient documentation

## 2021-01-01 DIAGNOSIS — S0990XA Unspecified injury of head, initial encounter: Secondary | ICD-10-CM | POA: Diagnosis present

## 2021-01-01 NOTE — ED Triage Notes (Signed)
Pt brought in via ems from home.  Pt fell in the house.  Pt was turning with her walker, lost balance and fell.  Hematoma to back of head.  No loc  No vomiting.  Pt has rib fx from previous fall.  Pt alert  Speech clear.

## 2021-01-01 NOTE — ED Provider Notes (Signed)
[facility] Emergency Department Provider Note   ____________________________________________   Event Date/Time   First MD Initiated Contact with Patient 01/01/21 2119     (approximate)  I have reviewed the triage vital signs and the nursing notes.   HISTORY  Chief Complaint Head Injury    HPI Olivia Lewis is a 87 y.o. female with past medical history of hypertension, hyperlipidemia, CKD, and atrial fibrillation who presents to the ED following fall.  Patient reports that she has frequent issues with balance and again lost her balance today, causing her to fall.  She states that she was attempting to turn around with the assistance of her walker when she tripped backwards and hit her head.  She believes she hit her head on a chair, but she denies losing consciousness.  She now complains of pain to the back of her head as well as her neck.  She additionally complains of pain along the right side of her chest.  She denies any pain in her arms or legs and has not had any pain in her abdomen or hips.  She does not take any blood thinners beyond a daily baby aspirin.        Past Medical History:  Diagnosis Date  . Allergy   . Anemia   . Arthritis   . B12 deficiency   . Cancer ([place])    skin--face and arms  . Cataracts, bilateral   . Chickenpox   . CKD (chronic kidney disease), stage III ([place])   . Diverticulosis   . GERD (gastroesophageal reflux disease)   . Hyperlipidemia   . Hypertension   . Hypothyroidism   . Osteoporosis     Patient Active Problem List   Diagnosis Date Noted  . Fall   . Leukocytosis   . Acute kidney injury superimposed on CKD ([place])   . Atrial fibrillation ([place]) 10/11/2020  . Atrial fibrillation with rapid ventricular response ([place]) 10/10/2020  . B12 deficiency 04/07/2016  . Acid reflux 04/07/2016  . HLD (hyperlipidemia) 04/07/2016  . Essential hypertension 04/07/2016  . Arthritis, degenerative 04/07/2016  . OP  (osteoporosis) 04/07/2016  . Primary osteoarthritis of knee 05/06/2015  . Hypothyroidism 02/25/2015  . Chronic kidney disease (CKD), stage III (moderate) ([place]) 02/25/2015  . Calcium blood increased 02/25/2015  . Baker's cyst of knee 05/30/2014    Past Surgical History:  Procedure Laterality Date  . BUNIONECTOMY Bilateral   . CARDIAC CATHETERIZATION    . CATARACT EXTRACTION Bilateral   . DILATION AND CURETTAGE OF UTERUS    . EYE SURGERY Bilateral    cataract  surgery  . JOINT REPLACEMENT Right    total knee replacement  . meniscus tear Right   . multiple skin cancer removal    . NEUROMA SURGERY Bilateral    bilateral feet  . rotator cuff surgery Right   . TOTAL KNEE ARTHROPLASTY Left 05/06/2015   Procedure: TOTAL KNEE ARTHROPLASTY;  Surgeon: Hessie Knows, MD;  Location: [facility] ORS;  Service: Orthopedics;  Laterality: Left;    Prior to Admission medications   Medication Sig Start Date End Date Taking? Authorizing Provider  acetaminophen (TYLENOL) 500 MG tablet Take 1,000 mg by mouth daily as needed.     [provider]  aspirin EC 81 MG tablet Take 81 mg by mouth daily.     [provider]  atorvastatin (LIPITOR) 10 MG tablet Take 10 mg by mouth daily.     [provider]  Calcium Carb-Cholecalciferol (EQL  OYSTER SHELL CALCIUM/D) 500-200 MG-UNIT TABS Take 1 tablet by mouth daily.     [provider]  Cyanocobalamin (B-12 SL) Place 2,500 mcg under the tongue daily.     [provider]  docusate sodium (STOOL SOFTENER) 100 MG capsule Take 100 mg by mouth daily as needed.     [provider]  levothyroxine (SYNTHROID, LEVOTHROID) 50 MCG tablet Take 50 mcg by mouth daily at 6 (six) AM.    [provider]  lidocaine (LIDODERM) 5 % Place 1 patch onto the skin every 12 (twelve) hours. Remove & Discard patch within 12 hours or as directed by MD 12/03/20 12/03/21  Triplett, Dessa Phi, FNP  loratadine (CLARITIN) 10 MG tablet Take 10 mg  by mouth daily as needed for allergies.     [provider]  metoprolol tartrate (LOPRESSOR) 25 MG tablet Take 1 tablet (25 mg total) by mouth 2 (two) times daily. 10/12/20   Loletha Grayer, MD  traMADol (ULTRAM) 50 MG tablet Take 1 tablet (50 mg total) by mouth every 6 (six) hours as needed. 12/03/20   Triplett, Johnette Abraham B, FNP    Allergies Penicillin v potassium and Sulfa antibiotics  Family History  Problem Relation Age of Onset  . Hypertension Mother   . Hypertension Father   . Diabetes Sister   . Breast cancer Sister   . Colon cancer Sister   . Bone cancer Brother     Social History Social History   Tobacco Use  . Smoking status: Never Smoker  . Smokeless tobacco: Never Used  Vaping Use  . Vaping Use: Never used  Substance Use Topics  . Alcohol use: No  . Drug use: No    Review of Systems  Constitutional: No fever/chills Eyes: No visual changes. ENT: No sore throat. Cardiovascular: Positive for chest wall pain. Respiratory: Denies shortness of breath. Gastrointestinal: No abdominal pain.  No nausea, no vomiting.  No diarrhea.  No constipation. Genitourinary: Negative for dysuria. Musculoskeletal: Negative for back pain.  Positive for neck pain. Skin: Negative for rash. Neurological: Positive for headaches, negative for focal weakness or numbness.  ____________________________________________   PHYSICAL EXAM:  VITAL SIGNS: ED Triage Vitals  Enc Vitals Group     BP 01/01/21 2052 (!) 137/58     Pulse Rate 01/01/21 2052 (!) 58     Resp 01/01/21 2052 20     Temp 01/01/21 2052 97.8 F (36.6 C)     Temp Source 01/01/21 2052 Oral     SpO2 01/01/21 2052 97 %     Weight 01/01/21 2051 154 lb (69.9 kg)     Height 01/01/21 2051 5' (1.524 m)     Head Circumference --      Peak Flow --      Pain Score 01/01/21 2051 5     Pain Loc --      Pain Edu? --      Excl. in [place]? --     Constitutional: Alert and oriented. Eyes: Conjunctivae are normal. Head:  Atraumatic. Nose: No congestion/rhinnorhea. Mouth/Throat: Mucous membranes are moist. Neck: Normal ROM, no midline cervical spine tenderness noted. Cardiovascular: Normal rate, regular rhythm. Grossly normal heart sounds. Respiratory: Normal respiratory effort.  No retractions. Lungs CTAB.  Right chest wall tenderness to palpation noted. Gastrointestinal: Soft and nontender. No distention. Genitourinary: deferred Musculoskeletal: No lower extremity tenderness nor edema.  No upper extremity bony tenderness to palpation.  Range of motion intact to all 4 extremities without pain. Neurologic:  Normal  speech and language. No gross focal neurologic deficits are appreciated. Skin:  Skin is warm, dry and intact. No rash noted. Psychiatric: Mood and affect are normal. Speech and behavior are normal.  ____________________________________________   LABS (all labs ordered are listed, but only abnormal results are displayed)  Labs Reviewed - No data to display   PROCEDURES  Procedure(s) performed (including Critical Care):  Procedures   ____________________________________________   INITIAL IMPRESSION / ASSESSMENT AND PLAN / ED COURSE       87 year old female with past medical history of hypertension, hyperlipidemia, CKD, and atrial fibrillation (not on anticoagulation) who presents to the ED following mechanical fall where she lost her balance and struck the back of her head on a chair.  She denies losing consciousness and has no focal neurologic deficits on exam.  She does have midline cervical spine tenderness and we will further assess with CT head and cervical spine.  There is no evidence of extremity bony injury but she does have some tenderness along her right chest wall, which we will further assess with chest x-ray.  She does have prior recent fall with right-sided rib fractures and some of her pain may be residual from this.  CT head and cervical spine are negative for acute  process.  Chest x-ray reviewed by me and shows right-sided rib fractures from previous fall with no evidence of acute injury.  Patient is appropriate for discharge home with PCP follow-up, she was encouraged to talk with her PCP about getting a home health aide or other assistance in the home.  She was counseled to return to the ED for any new or worsening symptoms, patient agrees with plan.      ____________________________________________   FINAL CLINICAL IMPRESSION(S) / ED DIAGNOSES  Final diagnoses:  Fall, initial encounter  Contusion of scalp, initial encounter  Closed fracture of multiple ribs of right side with routine healing, subsequent encounter     ED Discharge Orders    None       Note:  This document was prepared using Dragon voice recognition software and may include unintentional dictation errors.   Blake Divine, MD 01/01/21 2234

## 2021-01-01 NOTE — ED Notes (Signed)
Pt came in by [facility] from home lost her balance while taking out trash. Head injury no loc, also recent rib fx and co increased tenderness to area.

## 2021-01-23 ENCOUNTER — Telehealth: Payer: Self-pay | Admitting: Oncology

## 2021-01-23 NOTE — Telephone Encounter (Signed)
Patient called and reported that she had a fall and broke several ribs and wanted to change her appointments. Appointment r/s from 3/8 to 3/16. Updated AVS sent via mail.

## 2021-01-27 ENCOUNTER — Inpatient Hospital Stay: Payer: Medicare Other

## 2021-01-27 ENCOUNTER — Inpatient Hospital Stay: Payer: Medicare Other | Admitting: Oncology

## 2021-02-04 ENCOUNTER — Inpatient Hospital Stay: Payer: Medicare Other | Admitting: Oncology

## 2021-02-04 ENCOUNTER — Inpatient Hospital Stay: Payer: Medicare Other

## 2021-02-17 ENCOUNTER — Inpatient Hospital Stay: Payer: Medicare Other | Attending: Oncology

## 2021-02-17 ENCOUNTER — Encounter: Payer: Self-pay | Admitting: Oncology

## 2021-02-17 ENCOUNTER — Inpatient Hospital Stay (HOSPITAL_BASED_OUTPATIENT_CLINIC_OR_DEPARTMENT_OTHER): Payer: Medicare Other | Admitting: Oncology

## 2021-02-17 VITALS — BP 174/99 | HR 88 | Temp 96.9°F | Resp 18 | Wt 152.0 lb

## 2021-02-17 DIAGNOSIS — E785 Hyperlipidemia, unspecified: Secondary | ICD-10-CM | POA: Diagnosis not present

## 2021-02-17 DIAGNOSIS — R634 Abnormal weight loss: Secondary | ICD-10-CM | POA: Insufficient documentation

## 2021-02-17 DIAGNOSIS — D72829 Elevated white blood cell count, unspecified: Secondary | ICD-10-CM | POA: Diagnosis present

## 2021-02-17 DIAGNOSIS — Z7982 Long term (current) use of aspirin: Secondary | ICD-10-CM | POA: Insufficient documentation

## 2021-02-17 DIAGNOSIS — Z803 Family history of malignant neoplasm of breast: Secondary | ICD-10-CM | POA: Diagnosis not present

## 2021-02-17 DIAGNOSIS — K219 Gastro-esophageal reflux disease without esophagitis: Secondary | ICD-10-CM | POA: Diagnosis not present

## 2021-02-17 DIAGNOSIS — N183 Chronic kidney disease, stage 3 unspecified: Secondary | ICD-10-CM | POA: Insufficient documentation

## 2021-02-17 DIAGNOSIS — Z800 Family history of malignant neoplasm of digestive organs: Secondary | ICD-10-CM | POA: Diagnosis not present

## 2021-02-17 DIAGNOSIS — R5383 Other fatigue: Secondary | ICD-10-CM | POA: Diagnosis not present

## 2021-02-17 DIAGNOSIS — I129 Hypertensive chronic kidney disease with stage 1 through stage 4 chronic kidney disease, or unspecified chronic kidney disease: Secondary | ICD-10-CM | POA: Diagnosis not present

## 2021-02-17 DIAGNOSIS — D721 Eosinophilia, unspecified: Secondary | ICD-10-CM | POA: Insufficient documentation

## 2021-02-17 DIAGNOSIS — E039 Hypothyroidism, unspecified: Secondary | ICD-10-CM | POA: Diagnosis not present

## 2021-02-17 DIAGNOSIS — I4891 Unspecified atrial fibrillation: Secondary | ICD-10-CM | POA: Insufficient documentation

## 2021-02-17 DIAGNOSIS — M129 Arthropathy, unspecified: Secondary | ICD-10-CM | POA: Diagnosis not present

## 2021-02-17 DIAGNOSIS — D72820 Lymphocytosis (symptomatic): Secondary | ICD-10-CM | POA: Diagnosis not present

## 2021-02-17 DIAGNOSIS — Z79899 Other long term (current) drug therapy: Secondary | ICD-10-CM | POA: Insufficient documentation

## 2021-02-17 DIAGNOSIS — M810 Age-related osteoporosis without current pathological fracture: Secondary | ICD-10-CM | POA: Insufficient documentation

## 2021-02-17 LAB — CBC WITH DIFFERENTIAL/PLATELET
Abs Immature Granulocytes: 0.06 10*3/uL (ref 0.00–0.07)
Basophils Absolute: 0.1 10*3/uL (ref 0.0–0.1)
Basophils Relative: 1 %
Eosinophils Absolute: 0.1 10*3/uL (ref 0.0–0.5)
Eosinophils Relative: 1 %
HCT: 38.3 % (ref 36.0–46.0)
Hemoglobin: 12.9 g/dL (ref 12.0–15.0)
Immature Granulocytes: 1 %
Lymphocytes Relative: 20 %
Lymphs Abs: 2.1 10*3/uL (ref 0.7–4.0)
MCH: 30 pg (ref 26.0–34.0)
MCHC: 33.7 g/dL (ref 30.0–36.0)
MCV: 89.1 fL (ref 80.0–100.0)
Monocytes Absolute: 1 10*3/uL (ref 0.1–1.0)
Monocytes Relative: 9 %
Neutro Abs: 7.4 10*3/uL (ref 1.7–7.7)
Neutrophils Relative %: 68 %
Platelets: 314 10*3/uL (ref 150–400)
RBC: 4.3 MIL/uL (ref 3.87–5.11)
RDW: 14.3 % (ref 11.5–15.5)
WBC: 10.7 10*3/uL — ABNORMAL HIGH (ref 4.0–10.5)
nRBC: 0 % (ref 0.0–0.2)

## 2021-02-17 LAB — COMPREHENSIVE METABOLIC PANEL
ALT: 14 U/L (ref 0–44)
AST: 25 U/L (ref 15–41)
Albumin: 3.8 g/dL (ref 3.5–5.0)
Alkaline Phosphatase: 59 U/L (ref 38–126)
Anion gap: 10 (ref 5–15)
BUN: 32 mg/dL — ABNORMAL HIGH (ref 8–23)
CO2: 27 mmol/L (ref 22–32)
Calcium: 9.8 mg/dL (ref 8.9–10.3)
Chloride: 102 mmol/L (ref 98–111)
Creatinine, Ser: 1.12 mg/dL — ABNORMAL HIGH (ref 0.44–1.00)
GFR, Estimated: 47 mL/min — ABNORMAL LOW (ref >60)
Glucose, Bld: 98 mg/dL (ref 70–99)
Potassium: 4.3 mmol/L (ref 3.5–5.1)
Sodium: 139 mmol/L (ref 135–145)
Total Bilirubin: 0.7 mg/dL (ref 0.3–1.2)
Total Protein: 6.7 g/dL (ref 6.5–8.1)

## 2021-02-17 LAB — LACTATE DEHYDROGENASE: LDH: 141 U/L (ref 98–192)

## 2021-02-17 NOTE — Progress Notes (Signed)
Hematology/Oncology Consult note [facility] Telephone:([phone] [phone]   Patient Care Team: Baxter Hire, MD as PCP - General (Internal Medicine)  REFERRING PROVIDER: Baxter Hire, MD  CHIEF COMPLAINTS/REASON FOR VISIT:  Evaluation of leukocytosis  HISTORY OF PRESENTING ILLNESS:  Olivia Lewis is a  87 y.o.  female with PMH listed below who was referred to me for evaluation of leukocytosis Reviewed patient' recent labs obtained by PCP.   09/12/2020 CBC showed elevated white count of 19.8.  Hemoglobin 13.7, smear showed smudge cells.  Predominantly neutrophilia, lymphocytosis, monocytosis Previous lab records reviewed.  05/14/2020, WBC 11.5.  Predominantly lymphocytosis, basophilia, eosinophilia 01/02/2020, WBC 11.6 predominantly basophilia 11/13/2019, WBC 9.9 04/26/2019, WBC 10.1 10/27/19 19 WBC 11.6 predominantly basophilia No aggravating or elevated factors. Associated symptoms or signs:  Denies weight loss, fever, chills,night sweats.  Fatigue Smoking history: Former smoker History of recent oral steroid use or steroid injection: Patient reports recent 2 courses of steroids which she finished shortly prior to 09/12/2020 blood work. History of recent infection: Denies Autoimmune disease history.  Denies  Patient walks independently with a cane.  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  No other close family members.  Her husband passed away few years ago.   INTERVAL HISTORY Olivia Lewis is a 87 y.o. female who has above history reviewed by me today presents for follow up visit for management of leukocytosis Problems and complaints are listed below: Patient was last seen in November 2021.  Patient had work-up done for leukocytosis.  There was plan for her to follow-up following that visit to discuss lab results.  Appointment was canceled due to patient being hospitalized due  to fall, atrial fibrillation with rapid ventricular response.  After that visit, patient had additional fall episodes.  She has had right-sided rib fracture. Today she presents with clinic for follow-up.  She walks with a walker now.  No new complaints.  She has lost the weight since last visit in November 2021 which she attributes to the hospitalization, and fall episodes.   Review of Systems  Constitutional: Negative for appetite change, chills, fatigue and fever.  HENT:   Negative for hearing loss and voice change.   Eyes: Negative for eye problems.  Respiratory: Negative for chest tightness and cough.   Cardiovascular: Negative for chest pain.  Gastrointestinal: Negative for abdominal distention, abdominal pain and blood in stool.  Endocrine: Negative for hot flashes.  Genitourinary: Negative for difficulty urinating and frequency.   Musculoskeletal: Negative for arthralgias.  Skin: Negative for itching and rash.  Neurological:       Frequent falls  Hematological: Negative for adenopathy.  Psychiatric/Behavioral: Negative for confusion.     MEDICAL HISTORY:  Past Medical History:  Diagnosis Date  . Allergy   . Anemia   . Arthritis   . B12 deficiency   . Cancer ([place])    skin--face and arms  . Cataracts, bilateral   . Chickenpox   . CKD (chronic kidney disease), stage III ([place])   . Diverticulosis   . GERD (gastroesophageal reflux disease)   . Hyperlipidemia   . Hypertension   . Hypothyroidism   . Osteoporosis     SURGICAL HISTORY: Past Surgical History:  Procedure Laterality Date  . BUNIONECTOMY Bilateral   . CARDIAC CATHETERIZATION    . CATARACT EXTRACTION Bilateral   . DILATION AND CURETTAGE OF UTERUS    . EYE SURGERY Bilateral  cataract  surgery  . JOINT REPLACEMENT Right    total knee replacement  . meniscus tear Right   . multiple skin cancer removal    . NEUROMA SURGERY Bilateral    bilateral feet  . rotator cuff surgery Right   . TOTAL KNEE  ARTHROPLASTY Left 05/06/2015   Procedure: TOTAL KNEE ARTHROPLASTY;  Surgeon: Hessie Knows, MD;  Location: [facility] ORS;  Service: Orthopedics;  Laterality: Left;    SOCIAL HISTORY: Social History   Socioeconomic History  . Marital status: Widowed    Spouse name: Not on file  . Number of children: Not on file  . Years of education: Not on file  . Highest education level: Not on file  Occupational History  . Not on file  Tobacco Use  . Smoking status: Never Smoker  . Smokeless tobacco: Never Used  Vaping Use  . Vaping Use: Never used  Substance and Sexual Activity  . Alcohol use: No  . Drug use: No  . Sexual activity: Not Currently  Other Topics Concern  . Not on file  Social History Narrative  . Not on file   Social Determinants of Health   Financial Resource Strain: Not on file  Food Insecurity: Not on file  Transportation Needs: Not on file  Physical Activity: Not on file  Stress: Not on file  Social Connections: Not on file  Intimate Partner Violence: Not on file    FAMILY HISTORY: Family History  Problem Relation Age of Onset  . Hypertension Mother   . Hypertension Father   . Diabetes Sister   . Breast cancer Sister   . Colon cancer Sister   . Bone cancer Brother     ALLERGIES:  is allergic to penicillin v potassium and sulfa antibiotics.  MEDICATIONS:  Current Outpatient Medications  Medication Sig Dispense Refill  . acetaminophen (TYLENOL) 500 MG tablet Take 1,000 mg by mouth daily as needed.     Marland Kitchen aspirin EC 81 MG tablet Take 81 mg by mouth daily.     Marland Kitchen atorvastatin (LIPITOR) 10 MG tablet Take 10 mg by mouth daily.     . Calcium Carb-Cholecalciferol 500-200 MG-UNIT TABS Take 1 tablet by mouth daily.     . Cyanocobalamin (B-12 SL) Place 2,500 mcg under the tongue daily.     Marland Kitchen docusate sodium (COLACE) 100 MG capsule Take 100 mg by mouth daily as needed.     Marland Kitchen levothyroxine (SYNTHROID, LEVOTHROID) 50 MCG tablet Take 50 mcg by mouth daily at 6 (six) AM.     . loratadine (CLARITIN) 10 MG tablet Take 10 mg by mouth daily as needed for allergies.     . metoprolol tartrate (LOPRESSOR) 25 MG tablet Take 1 tablet (25 mg total) by mouth 2 (two) times daily. 60 tablet 0  . traMADol (ULTRAM) 50 MG tablet Take 1 tablet (50 mg total) by mouth every 6 (six) hours as needed. 12 tablet 0  . lidocaine (LIDODERM) 5 % Place 1 patch onto the skin every 12 (twelve) hours. Remove & Discard patch within 12 hours or as directed by MD (Patient not taking: Reported on 02/17/2021) 10 patch 0   No current facility-administered medications for this visit.     PHYSICAL EXAMINATION: ECOG PERFORMANCE STATUS: 1 - Symptomatic but completely ambulatory Vitals:   02/17/21 1325  BP: (!) 174/99  Pulse: 88  Resp: 18  Temp: (!) 96.9 F (36.1 C)   Filed Weights   02/17/21 1325  Weight: 152 lb (68.9 kg)  Physical Exam Constitutional:      General: She is not in acute distress.    Comments: Patient walks with a walker.  HENT:     Head: Normocephalic and atraumatic.  Eyes:     General: No scleral icterus. Cardiovascular:     Rate and Rhythm: Normal rate and regular rhythm.     Heart sounds: Normal heart sounds.  Pulmonary:     Effort: Pulmonary effort is normal. No respiratory distress.     Breath sounds: No wheezing.  Abdominal:     General: Bowel sounds are normal. There is no distension.     Palpations: Abdomen is soft.  Musculoskeletal:        General: No deformity. Normal range of motion.     Cervical back: Normal range of motion and neck supple.  Skin:    General: Skin is warm and dry.     Findings: No erythema or rash.  Neurological:     Mental Status: She is alert and oriented to person, place, and time. Mental status is at baseline.     Cranial Nerves: No cranial nerve deficit.     Coordination: Coordination normal.  Psychiatric:        Mood and Affect: Mood normal.     CMP Latest Ref Rng & Units 02/17/2021  Glucose 70 - 99 mg/dL 98  BUN 8 -  23 mg/dL 32(H)  Creatinine 0.44 - 1.00 mg/dL 1.12(H)  Sodium 135 - 145 mmol/L 139  Potassium 3.5 - 5.1 mmol/L 4.3  Chloride 98 - 111 mmol/L 102  CO2 22 - 32 mmol/L 27  Calcium 8.9 - 10.3 mg/dL 9.8  Total Protein 6.5 - 8.1 g/dL 6.7  Total Bilirubin 0.3 - 1.2 mg/dL 0.7  Alkaline Phos 38 - 126 U/L 59  AST 15 - 41 U/L 25  ALT 0 - 44 U/L 14   CBC Latest Ref Rng & Units 02/17/2021  WBC 4.0 - 10.5 K/uL 10.7(H)  Hemoglobin 12.0 - 15.0 g/dL 12.9  Hematocrit 36.0 - 46.0 % 38.3  Platelets 150 - 400 K/uL 314     RADIOGRAPHIC STUDIES: I have personally reviewed the radiological images as listed and agreed with the findings in the report. No results found.  LABORATORY DATA:  I have reviewed the data as listed Lab Results  Component Value Date   WBC 10.7 (H) 02/17/2021   HGB 12.9 02/17/2021   HCT 38.3 02/17/2021   MCV 89.1 02/17/2021   PLT 314 02/17/2021   Recent Labs    10/10/20 1130 10/11/20 0438 10/12/20 0439 02/17/21 1254  NA 140 141 135 139  K 4.0 4.0 3.8 4.3  CL 105 111 107 102  CO2 24 22 24 27   GLUCOSE 112* 87 112* 98  BUN 41* 27* 25* 32*  CREATININE 1.40* 0.97 1.03* 1.12*  CALCIUM 9.8 8.5* 8.6* 9.8  GFRNONAA 36* 56* 52* 47*  PROT 6.9  --  5.3* 6.7  ALBUMIN 4.1  --  3.0* 3.8  AST 40  --  19 25  ALT 22  --  15 14  ALKPHOS 42  --  31* 59  BILITOT 1.1  --  0.8 0.7   Iron/TIBC/Ferritin/ %Sat No results found for: IRON, TIBC, FERRITIN, IRONPCTSAT      ASSESSMENT & PLAN:  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 Negative HIV, hepatitis panel, normal LDH. Peripheral blood flow cytometry showed a CD5, CD10, CD103 negative monoclonal B-cell  population, nonspecific type.  1% of the leukocyte.  Less than 5000 per/ul.  A typical phenotype for CLL/SLL, mantle cell lymphoma, hairy cell lymphoma or follicular cell lymphoma is not detected.  In the absence of patient's history of B-cell  lymphoma, small clonal B-cell population is classified as monoclonal B-cell lymphocytosis. Recommend observation. She is asymptomatic except that she has lost weight since her last visit.  She attributes to recent hospitalization and for episodes.  Close monitor symptoms.  Follow-up in 6 months.   All questions were answered. The patient knows to call the clinic with any problems questions or concerns.  Earlie Server, MD, PhD Hematology Oncology [facility] Pager- [phone] 02/17/2021

## 2021-02-17 NOTE — Progress Notes (Signed)
Pt here for follow up. No new concerns voiced.   

## 2021-04-08 ENCOUNTER — Emergency Department: Payer: Medicare Other

## 2021-04-08 ENCOUNTER — Encounter: Payer: Self-pay | Admitting: Emergency Medicine

## 2021-04-08 ENCOUNTER — Other Ambulatory Visit: Payer: Self-pay

## 2021-04-08 ENCOUNTER — Observation Stay
Admission: EM | Admit: 2021-04-08 | Discharge: 2021-04-09 | Disposition: A | Discharge status: Home / Self Care | Payer: Medicare Other | Attending: Internal Medicine | Admitting: Internal Medicine

## 2021-04-08 DIAGNOSIS — E785 Hyperlipidemia, unspecified: Secondary | ICD-10-CM | POA: Diagnosis not present

## 2021-04-08 DIAGNOSIS — I129 Hypertensive chronic kidney disease with stage 1 through stage 4 chronic kidney disease, or unspecified chronic kidney disease: Secondary | ICD-10-CM | POA: Insufficient documentation

## 2021-04-08 DIAGNOSIS — I4891 Unspecified atrial fibrillation: Secondary | ICD-10-CM | POA: Diagnosis present

## 2021-04-08 DIAGNOSIS — Z7982 Long term (current) use of aspirin: Secondary | ICD-10-CM | POA: Insufficient documentation

## 2021-04-08 DIAGNOSIS — D72829 Elevated white blood cell count, unspecified: Secondary | ICD-10-CM | POA: Diagnosis present

## 2021-04-08 DIAGNOSIS — Z96653 Presence of artificial knee joint, bilateral: Secondary | ICD-10-CM | POA: Diagnosis not present

## 2021-04-08 DIAGNOSIS — R778 Other specified abnormalities of plasma proteins: Secondary | ICD-10-CM | POA: Diagnosis not present

## 2021-04-08 DIAGNOSIS — Z85828 Personal history of other malignant neoplasm of skin: Secondary | ICD-10-CM | POA: Insufficient documentation

## 2021-04-08 DIAGNOSIS — R0602 Shortness of breath: Secondary | ICD-10-CM | POA: Diagnosis present

## 2021-04-08 DIAGNOSIS — R000 Tachycardia, unspecified: Secondary | ICD-10-CM

## 2021-04-08 DIAGNOSIS — Z20822 Contact with and (suspected) exposure to covid-19: Secondary | ICD-10-CM | POA: Insufficient documentation

## 2021-04-08 DIAGNOSIS — N1832 Chronic kidney disease, stage 3b: Secondary | ICD-10-CM

## 2021-04-08 DIAGNOSIS — Z79899 Other long term (current) drug therapy: Secondary | ICD-10-CM | POA: Diagnosis not present

## 2021-04-08 DIAGNOSIS — I10 Essential (primary) hypertension: Secondary | ICD-10-CM | POA: Diagnosis not present

## 2021-04-08 DIAGNOSIS — D72820 Lymphocytosis (symptomatic): Secondary | ICD-10-CM | POA: Diagnosis present

## 2021-04-08 DIAGNOSIS — N183 Chronic kidney disease, stage 3 unspecified: Secondary | ICD-10-CM | POA: Diagnosis present

## 2021-04-08 DIAGNOSIS — E039 Hypothyroidism, unspecified: Secondary | ICD-10-CM | POA: Insufficient documentation

## 2021-04-08 LAB — CBC
HCT: 39.9 % (ref 36.0–46.0)
Hemoglobin: 13.3 g/dL (ref 12.0–15.0)
MCH: 28.7 pg (ref 26.0–34.0)
MCHC: 33.3 g/dL (ref 30.0–36.0)
MCV: 86.2 fL (ref 80.0–100.0)
Platelets: 297 10*3/uL (ref 150–400)
RBC: 4.63 MIL/uL (ref 3.87–5.11)
RDW: 15.2 % (ref 11.5–15.5)
WBC: 11.9 10*3/uL — ABNORMAL HIGH (ref 4.0–10.5)
nRBC: 0 % (ref 0.0–0.2)

## 2021-04-08 LAB — SARS CORONAVIRUS 2 (TAT 6-24 HRS): SARS Coronavirus 2: NEGATIVE

## 2021-04-08 LAB — TROPONIN I (HIGH SENSITIVITY)
Troponin I (High Sensitivity): 791 ng/L (ref <18)
Troponin I (High Sensitivity): 729 ng/L (ref <18)
Troponin I (High Sensitivity): 785 ng/L (ref <18)
Troponin I (High Sensitivity): 638 ng/L (ref <18)

## 2021-04-08 LAB — BASIC METABOLIC PANEL
Anion gap: 13 (ref 5–15)
BUN: 39 mg/dL — ABNORMAL HIGH (ref 8–23)
CO2: 22 mmol/L (ref 22–32)
Calcium: 9.9 mg/dL (ref 8.9–10.3)
Chloride: 107 mmol/L (ref 98–111)
Creatinine, Ser: 1.37 mg/dL — ABNORMAL HIGH (ref 0.44–1.00)
GFR, Estimated: 37 mL/min — ABNORMAL LOW (ref >60)
Glucose, Bld: 112 mg/dL — ABNORMAL HIGH (ref 70–99)
Potassium: 4.2 mmol/L (ref 3.5–5.1)
Sodium: 142 mmol/L (ref 135–145)

## 2021-04-08 LAB — BRAIN NATRIURETIC PEPTIDE: B Natriuretic Peptide: 1339.4 pg/mL — ABNORMAL HIGH (ref 0.0–100.0)

## 2021-04-08 LAB — TSH: TSH: 2.471 u[IU]/mL (ref 0.350–4.500)

## 2021-04-08 LAB — MAGNESIUM: Magnesium: 1.7 mg/dL (ref 1.7–2.4)

## 2021-04-08 MED ORDER — CALCIUM CARBONATE-VITAMIN D 500-200 MG-UNIT PO TABS
1.0000 | ORAL_TABLET | Freq: Every day | ORAL | Status: DC
  Administered 2021-04-09: 1 via ORAL
  Filled 2021-04-08: qty 1

## 2021-04-08 MED ORDER — METOPROLOL SUCCINATE ER 50 MG PO TB24
75.0000 mg | ORAL_TABLET | Freq: Every day | ORAL | Status: DC
  Administered 2021-04-09: 75 mg via ORAL
  Filled 2021-04-08 (×2): qty 2
  Filled 2021-04-08: qty 1

## 2021-04-08 MED ORDER — DILTIAZEM HCL 25 MG/5ML IV SOLN
5.0000 mg | Freq: Once | INTRAVENOUS | Status: AC
  Administered 2021-04-08: 5 mg via INTRAVENOUS

## 2021-04-08 MED ORDER — LEVOTHYROXINE SODIUM 50 MCG PO TABS
50.0000 ug | ORAL_TABLET | Freq: Every day | ORAL | Status: DC
  Administered 2021-04-09: 50 ug via ORAL
  Filled 2021-04-08: qty 1

## 2021-04-08 MED ORDER — DOCUSATE SODIUM 100 MG PO CAPS: 100.0000 mg | ORAL_CAPSULE | Freq: Every day | ORAL | Status: DC | PRN

## 2021-04-08 MED ORDER — ATORVASTATIN CALCIUM 10 MG PO TABS
10.0000 mg | ORAL_TABLET | Freq: Every day | ORAL | Status: DC
  Administered 2021-04-08 – 2021-04-09 (×2): 10 mg via ORAL
  Filled 2021-04-08 (×2): qty 1

## 2021-04-08 MED ORDER — METOPROLOL TARTRATE 25 MG PO TABS: 25.0000 mg | ORAL_TABLET | Freq: Two times a day (BID) | ORAL | Status: DC

## 2021-04-08 MED ORDER — ENOXAPARIN SODIUM 30 MG/0.3ML IJ SOSY
30.0000 mg | PREFILLED_SYRINGE | INTRAMUSCULAR | Status: DC
  Administered 2021-04-08: 30 mg via SUBCUTANEOUS
  Filled 2021-04-08: qty 0.3

## 2021-04-08 MED ORDER — DILTIAZEM HCL 30 MG PO TABS
30.0000 mg | ORAL_TABLET | Freq: Four times a day (QID) | ORAL | Status: DC
  Filled 2021-04-08 (×3): qty 1

## 2021-04-08 MED ORDER — ASPIRIN EC 81 MG PO TBEC
81.0000 mg | DELAYED_RELEASE_TABLET | Freq: Every day | ORAL | Status: DC
  Administered 2021-04-08 – 2021-04-09 (×2): 81 mg via ORAL
  Filled 2021-04-08 (×2): qty 1

## 2021-04-08 MED ORDER — DILTIAZEM HCL 25 MG/5ML IV SOLN
INTRAVENOUS | Status: AC
  Filled 2021-04-08: qty 5

## 2021-04-08 MED ORDER — VITAMIN B-12 1000 MCG PO TABS
2500.0000 ug | ORAL_TABLET | Freq: Every day | ORAL | Status: DC
  Administered 2021-04-09: 2500 ug via ORAL
  Filled 2021-04-08: qty 3

## 2021-04-08 MED ORDER — ONDANSETRON HCL 4 MG/2ML IJ SOLN: 4.0000 mg | Freq: Three times a day (TID) | INTRAMUSCULAR | Status: DC | PRN

## 2021-04-08 MED ORDER — HYDRALAZINE HCL 20 MG/ML IJ SOLN
5.0000 mg | INTRAMUSCULAR | Status: DC | PRN
  Administered 2021-04-08: 5 mg via INTRAVENOUS
  Filled 2021-04-08: qty 1

## 2021-04-08 MED ORDER — TRAMADOL HCL 50 MG PO TABS: 50.0000 mg | ORAL_TABLET | Freq: Two times a day (BID) | ORAL | Status: DC | PRN

## 2021-04-08 MED ORDER — LORATADINE 10 MG PO TABS: 10.0000 mg | ORAL_TABLET | Freq: Every day | ORAL | Status: DC | PRN

## 2021-04-08 MED ORDER — ACETAMINOPHEN 325 MG PO TABS
650.0000 mg | ORAL_TABLET | Freq: Four times a day (QID) | ORAL | Status: DC | PRN
  Administered 2021-04-08 – 2021-04-09 (×2): 650 mg via ORAL
  Filled 2021-04-08 (×2): qty 2

## 2021-04-08 MED ORDER — METOPROLOL SUCCINATE ER 50 MG PO TB24: 50.0000 mg | ORAL_TABLET | Freq: Every day | ORAL | Status: DC

## 2021-04-08 NOTE — ED Notes (Signed)
Pt up independently to bathroom.

## 2021-04-08 NOTE — ED Notes (Signed)
Critical trop reported to Dr Cherylann Banas

## 2021-04-08 NOTE — ED Notes (Signed)
BP elevated. Will administer prn hydralazine per order and continue to monitor.

## 2021-04-08 NOTE — Plan of Care (Signed)

## 2021-04-08 NOTE — ED Notes (Signed)
Informed RN bed assigned 

## 2021-04-08 NOTE — ED Triage Notes (Signed)
Pt comes into the ED from [facility] clinic for EKG that showed SVT.  Pt does admit to some SHOB.  Pt denies any CP at this time.  Pt able to ambulate from wheelchair to bed.  Pt explains that she has been having the [facility] since last night.  Pt had a normal f/u with her PCP where they found the tachycardia.

## 2021-04-08 NOTE — ED Provider Notes (Signed)
[facility] Emergency Department Provider Note ____________________________________________   Event Date/Time   First MD Initiated Contact with Patient 04/08/21 1152     (approximate)  I have reviewed the triage vital signs and the nursing notes.   HISTORY  Chief Complaint Tachycardia    HPI Olivia Lewis is a 87 y.o. female with PMH as noted below including atrial fibrillation who presents with an elevated heart rate noted when she was at the outpatient clinic today.  Patient states that she had some mild shortness of breath last night but was feeling fine this morning.  She was at the clinic for routine follow-up visit.  She denies any chest pain, current shortness of breath, palpitations, lightheadedness, or other acute symptoms.  Past Medical History:  Diagnosis Date  . Allergy   . Anemia   . Arthritis   . B12 deficiency   . Cancer ([place])    skin--face and arms  . Cataracts, bilateral   . Chickenpox   . CKD (chronic kidney disease), stage III ([place])   . Diverticulosis   . GERD (gastroesophageal reflux disease)   . Hyperlipidemia   . Hypertension   . Hypothyroidism   . Osteoporosis     Patient Active Problem List   Diagnosis Date Noted  . Elevated troponin 04/08/2021  . Fall   . Leukocytosis   . Acute kidney injury superimposed on CKD ([place])   . Atrial fibrillation ([place]) 10/11/2020  . Atrial fibrillation with rapid ventricular response ([place]) 10/10/2020  . B12 deficiency 04/07/2016  . Acid reflux 04/07/2016  . HLD (hyperlipidemia) 04/07/2016  . Essential hypertension 04/07/2016  . Arthritis, degenerative 04/07/2016  . OP (osteoporosis) 04/07/2016  . Primary osteoarthritis of knee 05/06/2015  . Hypothyroidism 02/25/2015  . Chronic kidney disease (CKD), stage III (moderate) ([place]) 02/25/2015  . Calcium blood increased 02/25/2015  . Baker's cyst of knee 05/30/2014    Past Surgical History:  Procedure Laterality Date  . BUNIONECTOMY  Bilateral   . CARDIAC CATHETERIZATION    . CATARACT EXTRACTION Bilateral   . DILATION AND CURETTAGE OF UTERUS    . EYE SURGERY Bilateral    cataract  surgery  . JOINT REPLACEMENT Right    total knee replacement  . meniscus tear Right   . multiple skin cancer removal    . NEUROMA SURGERY Bilateral    bilateral feet  . rotator cuff surgery Right   . TOTAL KNEE ARTHROPLASTY Left 05/06/2015   Procedure: TOTAL KNEE ARTHROPLASTY;  Surgeon: Hessie Knows, MD;  Location: [facility] ORS;  Service: Orthopedics;  Laterality: Left;    Prior to Admission medications   Medication Sig Start Date End Date Taking? Authorizing Provider  acetaminophen (TYLENOL) 500 MG tablet Take 1,000 mg by mouth daily as needed.     [provider]  aspirin EC 81 MG tablet Take 81 mg by mouth daily.     [provider]  atorvastatin (LIPITOR) 10 MG tablet Take 10 mg by mouth daily.     [provider]  Calcium Carb-Cholecalciferol 500-200 MG-UNIT TABS Take 1 tablet by mouth daily.     [provider]  Cyanocobalamin (B-12 SL) Place 2,500 mcg under the tongue daily.     [provider]  docusate sodium (COLACE) 100 MG capsule Take 100 mg by mouth daily as needed.     [provider]  levothyroxine (SYNTHROID, LEVOTHROID) 50 MCG tablet Take 50 mcg by mouth daily at 6 (six) AM.    [provider]  lidocaine (LIDODERM) 5 % Place 1 patch onto the skin every 12 (twelve) hours. Remove & Discard patch within 12 hours or as directed by MD Patient not taking: Reported on 02/17/2021 12/03/20 12/03/21  Victorino Dike, FNP  loratadine (CLARITIN) 10 MG tablet Take 10 mg by mouth daily as needed for allergies.     [provider]  metoprolol tartrate (LOPRESSOR) 25 MG tablet Take 1 tablet (25 mg total) by mouth 2 (two) times daily. 10/12/20   Loletha Grayer, MD  traMADol (ULTRAM) 50 MG tablet Take 1 tablet (50 mg total) by mouth every 6 (six) hours as needed. 12/03/20    Triplett, Johnette Abraham B, FNP    Allergies Penicillin v potassium and Sulfa antibiotics  Family History  Problem Relation Age of Onset  . Hypertension Mother   . Hypertension Father   . Diabetes Sister   . Breast cancer Sister   . Colon cancer Sister   . Bone cancer Brother     Social History Social History   Tobacco Use  . Smoking status: Never Smoker  . Smokeless tobacco: Never Used  Vaping Use  . Vaping Use: Never used  Substance Use Topics  . Alcohol use: No  . Drug use: No    Review of Systems  Constitutional: No fever. Eyes: No redness. ENT: No sore throat. Cardiovascular: Denies chest pain. Respiratory: Positive for resolved shortness of breath. Gastrointestinal: No vomiting or diarrhea.  Genitourinary: Negative for dysuria.  Musculoskeletal: Negative for back pain. Skin: Negative for rash. Neurological: Negative for headache.   ____________________________________________   PHYSICAL EXAM:  VITAL SIGNS: ED Triage Vitals  Enc Vitals Group     BP 04/08/21 1153 (!) 130/100     Pulse Rate 04/08/21 1153 (!) 147     Resp 04/08/21 1153 (!) 22     Temp 04/08/21 1200 97.9 F (36.6 C)     Temp Source 04/08/21 1200 Oral     SpO2 04/08/21 1153 98 %     Weight 04/08/21 1151 150 lb (68 kg)     Height 04/08/21 1151 5' (1.524 m)     Head Circumference --      Peak Flow --      Pain Score 04/08/21 1151 0     Pain Loc --      Pain Edu? --      Excl. in [place]? --     Constitutional: Alert and oriented. Well appearing for age and in no acute distress. Eyes: Conjunctivae are normal.  Head: Atraumatic. Nose: No congestion/rhinnorhea. Mouth/Throat: Mucous membranes are moist.   Neck: Normal range of motion.  Cardiovascular: Tachycardic, slightly irregular rhythm. Grossly normal heart sounds.  Good peripheral circulation. Respiratory: Normal respiratory effort.  No retractions. Lungs CTAB. Gastrointestinal: No distention.  Musculoskeletal: No lower extremity edema.   Extremities warm and well perfused.  Neurologic:  Normal speech and language. No gross focal neurologic deficits are appreciated.  Skin:  Skin is warm and dry. No rash noted. Psychiatric: Mood and affect are normal. Speech and behavior are normal.  ____________________________________________   LABS (all labs ordered are listed, but only abnormal results are displayed)  Labs Reviewed  BASIC METABOLIC PANEL - Abnormal; Notable for the following components:      Result Value   Glucose, Bld 112 (*)    BUN 39 (*)    Creatinine, Ser 1.37 (*)    GFR, Estimated 37 (*)    All other components within normal limits  CBC -  Abnormal; Notable for the following components:   WBC 11.9 (*)    All other components within normal limits  TROPONIN I (HIGH SENSITIVITY) - Abnormal; Notable for the following components:   Troponin I (High Sensitivity) 791 (*)    All other components within normal limits  TROPONIN I (HIGH SENSITIVITY)   ____________________________________________  EKG  ED ECG REPORT I, Arta Silence, the attending physician, personally viewed and interpreted this ECG.  Date: 04/08/2021 EKG Time: 1130 Rate: 156 Rhythm: Atrial fibrillation with RVR versus SVT QRS Axis: normal Intervals: normal ST/T Wave abnormalities: normal Narrative Interpretation: Supraventricular tachycardia, likely atrial fibrillation with RVR  ED ECG REPORT I, Arta Silence, the attending physician, personally viewed and interpreted this ECG.  Date: 04/08/2021 EKG Time: 1200 Rate: 65 Rhythm: normal sinus rhythm QRS Axis: normal Intervals: normal ST/T Wave abnormalities: normal Narrative Interpretation: no evidence of acute ischemia   ____________________________________________  RADIOLOGY  Chest x-ray interpreted by me shows no focal infiltrate or edema  ____________________________________________   PROCEDURES  Procedure(s) performed: No  Procedures  Critical Care  performed: Yes  CRITICAL CARE Performed by: Arta Silence   Total critical care time: 30 minutes  Critical care time was exclusive of separately billable procedures and treating other patients.  Critical care was necessary to treat or prevent imminent or life-threatening deterioration.  Critical care was time spent personally by me on the following activities: development of treatment plan with patient and/or surrogate as well as nursing, discussions with consultants, evaluation of patient's response to treatment, examination of patient, obtaining history from patient or surrogate, ordering and performing treatments and interventions, ordering and review of laboratory studies, ordering and review of radiographic studies, pulse oximetry and re-evaluation of patient's condition. ____________________________________________   INITIAL IMPRESSION / ASSESSMENT AND PLAN / ED COURSE  Pertinent labs & imaging results that were available during my care of the patient were reviewed by me and considered in my medical decision making (see chart for details).  87 year old female with PMH as noted above including paroxysmal atrial fibrillation presents after she was noted to have an elevated heart rate in the 150s when she went to a routine outpatient clinic visit today.  The patient had some shortness of breath yesterday but is otherwise asymptomatic.  On exam, she is overall very well-appearing.  The vital signs are normal except for the elevated heart rate.  Physical exam is otherwise unremarkable.  EKG shows a narrow complex tachycardia, most consistent with atrial fibrillation although the rate is relatively regular.  Overall presentation is most consistent with atrial fibrillation given the patient's past history.  It is unlikely that she would have new onset SVT at this age.  We will give IV Cardizem for rate control and reassess.  ----------------------------------------- 1:45 PM on  04/08/2021 -----------------------------------------  The patient converted to sinus rhythm that her rate went to the 60s after the initial bolus of Cardizem.  Her vital signs remained stable.  However, her initial troponin is 790.  I suspect that this is demand ischemia related to her RVR.  The patient has no active chest pain or EKG changes at this time.  Therefore, we will not initiate heparin, however she will need monitoring and serial troponins.  I consulted Dr. Blaine Hamper from the hospitalist service for admission.  ____________________________________________   FINAL CLINICAL IMPRESSION(S) / ED DIAGNOSES  Final diagnoses:  Atrial fibrillation with RVR (HCC)  Elevated troponin      NEW MEDICATIONS STARTED DURING THIS VISIT:  New Prescriptions  No medications on file     Note:  This document was prepared using Dragon voice recognition software and may include unintentional dictation errors.    Arta Silence, MD 04/08/21 1346

## 2021-04-08 NOTE — ED Notes (Signed)
Patient moved to room 36. Hooked to monitor, vitals updated. Pt assisted to [facility] and back to bed. She has no complaints at this time. Hospitalist at bedside.

## 2021-04-08 NOTE — Consult Note (Signed)
Cardiology Consultation Note    Patient ID: Olivia Lewis, MRN: [id number], DOB/AGE: [id number] 85 y.o. Admit date: 04/08/2021   Date of Consult: 04/08/2021 Primary Physician: Baxter Hire, MD Primary Cardiologist: Nehemiah Massed  Chief Complaint: afib Reason for Consultation: afib/troponin Requesting MD: Blaine Hamper  HPI: Olivia Lewis is a 87 y.o. female with history of atrial fibrillation which was diagnosed in November of last year.  She had a history of falling and was not felt to be a candidate for chronic anticoagulation at that time and was sent home on aspirin.  She now returns to the emergency room with some shortness of breath last night but "feels good this morning.  She presented for routine follow-up visit to her clinic and was noted to have a rapid heart rate.  She was asymptomatic.  She is currently on atorvastatin 10 mg daily, aspirin daily, metoprolol succinate 50 mg daily as an outpatient.  She denied chest pain however t  roponins drawn in the ER were elevated at 791.  BNP was 1339.  EKG at 12:00 today showed sinus rhythm with no ischemia.  Initial EKG showed A. fib.  She converted with IV diltiazem x1.  She is hemodynamically stable with a blood pressure of 149/80.  Currently in normal sinus rhythm at a rate of 81  Past Medical History:  Diagnosis Date  . Allergy   . Anemia   . Arthritis   . B12 deficiency   . Cancer ([place])    skin--face and arms  . Cataracts, bilateral   . Chickenpox   . CKD (chronic kidney disease), stage III ([place])   . Diverticulosis   . GERD (gastroesophageal reflux disease)   . Hyperlipidemia   . Hypertension   . Hypothyroidism   . Osteoporosis       Surgical History:  Past Surgical History:  Procedure Laterality Date  . BUNIONECTOMY Bilateral   . CARDIAC CATHETERIZATION    . CATARACT EXTRACTION Bilateral   . DILATION AND CURETTAGE OF UTERUS    . EYE SURGERY Bilateral    cataract  surgery  . JOINT REPLACEMENT Right    total knee  replacement  . meniscus tear Right   . multiple skin cancer removal    . NEUROMA SURGERY Bilateral    bilateral feet  . rotator cuff surgery Right   . TOTAL KNEE ARTHROPLASTY Left 05/06/2015   Procedure: TOTAL KNEE ARTHROPLASTY;  Surgeon: Hessie Knows, MD;  Location: [facility] ORS;  Service: Orthopedics;  Laterality: Left;     Home Meds: Prior to Admission medications   Medication Sig Start Date End Date Taking? Authorizing Provider  acetaminophen (TYLENOL) 500 MG tablet Take 1,000 mg by mouth daily as needed.    Yes [provider]  aspirin EC 81 MG tablet Take 81 mg by mouth daily.    Yes [provider]  atorvastatin (LIPITOR) 10 MG tablet Take 10 mg by mouth daily.    Yes [provider]  Calcium Carb-Cholecalciferol 500-200 MG-UNIT TABS Take 1 tablet by mouth daily.    Yes [provider]  Cyanocobalamin (B-12 SL) Place 2,500 mcg under the tongue daily.    Yes [provider]  docusate sodium (COLACE) 100 MG capsule Take 100 mg by mouth daily as needed.    Yes [provider]  levothyroxine (SYNTHROID, LEVOTHROID) 50 MCG tablet Take 50 mcg by mouth daily at 6 (six) AM.   Yes [provider]  loratadine (CLARITIN) 10 MG tablet Take 10  mg by mouth daily as needed for allergies.    Yes [provider]  metoprolol tartrate (LOPRESSOR) 25 MG tablet Take 1 tablet (25 mg total) by mouth 2 (two) times daily. 10/12/20  Yes Wieting, Richard, MD  traMADol (ULTRAM) 50 MG tablet Take 1 tablet (50 mg total) by mouth every 6 (six) hours as needed. 12/03/20  Yes Triplett, Cari B, FNP  lidocaine (LIDODERM) 5 % Place 1 patch onto the skin every 12 (twelve) hours. Remove & Discard patch within 12 hours or as directed by MD Patient not taking: No sig reported 12/03/20 12/03/21  Victorino Dike, FNP    Inpatient Medications:  . enoxaparin (LOVENOX) injection  30 mg Subcutaneous Q24H     Allergies:  Allergies  Allergen Reactions  .  Penicillin V Potassium Other (See Comments)    Has patient had a PCN reaction causing immediate rash, facial/tongue/throat swelling, SOB or lightheadedness with hypotension: Unknown Has patient had a PCN reaction causing severe rash involving mucus membranes or skin necrosis: Unknown Has patient had a PCN reaction that required hospitalization: Unknown Has patient had a PCN reaction occurring within the last 10 years: No If all of the above answers are "NO", then may proceed with Cephalosporin use.   . Sulfa Antibiotics Other (See Comments)    headache    Social History   Socioeconomic History  . Marital status: Widowed    Spouse name: Not on file  . Number of children: Not on file  . Years of education: Not on file  . Highest education level: Not on file  Occupational History  . Not on file  Tobacco Use  . Smoking status: Never Smoker  . Smokeless tobacco: Never Used  Vaping Use  . Vaping Use: Never used  Substance and Sexual Activity  . Alcohol use: No  . Drug use: No  . Sexual activity: Not Currently  Other Topics Concern  . Not on file  Social History Narrative  . Not on file   Social Determinants of Health   Financial Resource Strain: Not on file  Food Insecurity: Not on file  Transportation Needs: Not on file  Physical Activity: Not on file  Stress: Not on file  Social Connections: Not on file  Intimate Partner Violence: Not on file     Family History  Problem Relation Age of Onset  . Hypertension Mother   . Hypertension Father   . Diabetes Sister   . Breast cancer Sister   . Colon cancer Sister   . Bone cancer Brother      Review of Systems: A 12-system review of systems was performed and is negative except as noted in the HPI.  Labs: No results for input(s): CKTOTAL, CKMB, TROPONINI in the last 72 hours. Lab Results  Component Value Date   WBC 11.9 (H) 04/08/2021   HGB 13.3 04/08/2021   HCT 39.9 04/08/2021   MCV 86.2 04/08/2021   PLT 297  04/08/2021    Recent Labs  Lab 04/08/21 1159  NA 142  K 4.2  CL 107  CO2 22  BUN 39*  CREATININE 1.37*  CALCIUM 9.9  GLUCOSE 112*   No results found for: CHOL, HDL, LDLCALC, TRIG No results found for: DDIMER  Radiology/Studies:  DG Chest Port 1 View  Result Date: 04/08/2021 CLINICAL DATA:  Abnormal EKG, shortness of breath. EXAM: PORTABLE CHEST 1 VIEW COMPARISON:  01/01/2021. FINDINGS: Trachea is midline. Heart is enlarged. Thoracic aorta is calcified. Biapical pleural thickening. Lungs are  clear. No pleural fluid. IMPRESSION: No acute findings. Electronically Signed   By: Lorin Picket M.D.   On: 04/08/2021 12:36    Wt Readings from Last 3 Encounters:  04/08/21 68 kg  02/17/21 68.9 kg  01/01/21 69.9 kg    EKG: Initially A. fib with RVR now in normal sinus rhythm  Physical Exam:  Blood pressure (!) 149/80, pulse 65, temperature 97.9 F (36.6 C), temperature source Oral, resp. rate 18, height 5' (1.524 m), weight 68 kg, SpO2 98 %. Body mass index is 29.29 kg/m. General: Well developed, well nourished, in no acute distress. Head: Normocephalic, atraumatic, sclera non-icteric, no xanthomas, nares are without discharge.  Neck: Negative for carotid bruits. JVD not elevated. Lungs: Clear bilaterally to auscultation without wheezes, rales, or rhonchi. Breathing is unlabored. Heart: RRR with S1 S2. No murmurs, rubs, or gallops appreciated. Abdomen: Soft, non-tender, non-distended with normoactive bowel sounds. No hepatomegaly. No rebound/guarding. No obvious abdominal masses. Msk:  Strength and tone appear normal for age. Extremities: No clubbing or cyanosis. No edema.  Distal pedal pulses are 2+ and equal bilaterally. Neuro: Alert and oriented X 3. No facial asymmetry. No focal deficit. Moves all extremities spontaneously. Psych:  Responds to questions appropriately with a normal affect.     Assessment and Plan  87 y.o. female with history of atrial fibrillation which was  diagnosed in November of last year.  She had a history of falling and was not felt to be a candidate for chronic anticoagulation at that time and was sent home on aspirin.  She now returns to the emergency room with some shortness of breath last night but "feels good this morning.  She presented for routine follow-up visit to her clinic and was noted to have a rapid heart rate.  She was asymptomatic.  She is currently on atorvastatin 10 mg daily, aspirin daily, metoprolol tartrate 25 mg twice daily.  She denied chest pain however t  roponins drawn in the ER were elevated at 791.  BNP was 1339.  EKG at 12:00 today showed sinus rhythm with no ischemia.  Initial EKG showed A. fib.  She converted with IV diltiazem x1.  She is hemodynamically stable with a blood pressure of 149/80.  1.  A. fib-he is on metoprolol succinate 50 mg at home.  Not felt to be a candidate for chronic anticoagulation due to frequent falls and bleeding risk.  Was told asymptomatic today presented to her primary care physician for routine visit and was noted to be tachycardic.  She denied chest pain.  She currently is chest pain-free and asymptomatic.  Troponins were drawn in the ER which were significantly elevated however EKG shows no ischemia and she is asymptomatic.  This appears to be demand secondary to her tachycardia.  We will increase her metoprolol succinate to 75 mg daily and remain off of anticoagulation due to the bleeding risk as mentioned above.  2.  Hypertension-we will need to follow as we increased her dose of metoprolol.  May need to add more if her pressure and heart rate tolerated.  Signed, Teodoro Spray MD 04/08/2021, 2:49 PM Pager: [phone]

## 2021-04-08 NOTE — H&P (Signed)
History and Physical    Olivia Lewis [id number] DOB: February 24, 1929 DOA: 04/08/2021  Referring MD/NP/PA:   PCP: Baxter Hire, MD   Patient coming from:  The patient is coming from home.  At baseline, pt is independent for most of ADL.        Chief Complaint: Shortness of breath  HPI: Olivia Lewis is a 87 y.o. female with medical history significant of A. fib not on anticoagulants, hypertension, hyperlipidemia, GERD, hypothyroidism, CKD stage IIIb, monoclonal B-cell lymphocytosis, who presents with shortness breath.  Pt stats that she has some shortness of breath since last night.  This morning she presented for routine follow-up visit to [facility] clinic and was noted to have a rapid heart rate.  Patient denies chest pain, cough, fever or chills.  No nausea vomiting, diarrhea or abdominal pain.  No symptoms of UTI. Her EKG showed SVT vs. A. Fib with RVR with HR up to 140s in ED. She converted with IV diltiazem 5mg  and HR down to 70-80s.  ED Course: pt was found to have trop 791 -->729, BNP 1339, WBC 11.9, pending COVID-19 PCR, renal function close to baseline, temperature normal, blood pressure 149/80, current heart rate is 78, RR 22, oxygen saturation 98% on room air.  Chest x-ray negative.  Patient is admitted to progressive bed for observation, Dr. Ubaldo Glassing of cardiology is consulted.  Review of Systems:   General: no fevers, chills, no body weight gain, has fatigue HEENT: no blurry vision, hearing changes or sore throat Respiratory: has dyspnea, no  coughing, wheezing CV: no chest pain, no palpitations GI: no nausea, vomiting, abdominal pain, diarrhea, constipation GU: no dysuria, burning on urination, increased urinary frequency, hematuria  Ext: has leg edema Neuro: no unilateral weakness, numbness, or tingling, no vision change or hearing loss Skin: no rash, no skin tear. MSK: No muscle spasm, no deformity, no limitation of range of movement in spin Heme: No easy bruising.   Travel history: No recent long distant travel.  Allergy:  Allergies  Allergen Reactions  . Penicillin V Potassium Other (See Comments)    Has patient had a PCN reaction causing immediate rash, facial/tongue/throat swelling, SOB or lightheadedness with hypotension: Unknown Has patient had a PCN reaction causing severe rash involving mucus membranes or skin necrosis: Unknown Has patient had a PCN reaction that required hospitalization: Unknown Has patient had a PCN reaction occurring within the last 10 years: No If all of the above answers are "NO", then may proceed with Cephalosporin use.   . Sulfa Antibiotics Other (See Comments)    headache    Past Medical History:  Diagnosis Date  . Allergy   . Anemia   . Arthritis   . B12 deficiency   . Cancer ([place])    skin--face and arms  . Cataracts, bilateral   . Chickenpox   . CKD (chronic kidney disease), stage III ([place])   . Diverticulosis   . GERD (gastroesophageal reflux disease)   . Hyperlipidemia   . Hypertension   . Hypothyroidism   . Osteoporosis     Past Surgical History:  Procedure Laterality Date  . BUNIONECTOMY Bilateral   . CARDIAC CATHETERIZATION    . CATARACT EXTRACTION Bilateral   . DILATION AND CURETTAGE OF UTERUS    . EYE SURGERY Bilateral    cataract  surgery  . JOINT REPLACEMENT Right    total knee replacement  . meniscus tear Right   . multiple skin cancer removal    .  NEUROMA SURGERY Bilateral    bilateral feet  . rotator cuff surgery Right   . TOTAL KNEE ARTHROPLASTY Left 05/06/2015   Procedure: TOTAL KNEE ARTHROPLASTY;  Surgeon: Hessie Knows, MD;  Location: [facility] ORS;  Service: Orthopedics;  Laterality: Left;    Social History:  reports that she has never smoked. She has never used smokeless tobacco. She reports that she does not drink alcohol and does not use drugs.  Family History:  Family History  Problem Relation Age of Onset  . Hypertension Mother   . Hypertension Father   . Diabetes  Sister   . Breast cancer Sister   . Colon cancer Sister   . Bone cancer Brother      Prior to Admission medications   Medication Sig Start Date End Date Taking? Authorizing Provider  acetaminophen (TYLENOL) 500 MG tablet Take 1,000 mg by mouth daily as needed.     [provider]  aspirin EC 81 MG tablet Take 81 mg by mouth daily.     [provider]  atorvastatin (LIPITOR) 10 MG tablet Take 10 mg by mouth daily.     [provider]  Calcium Carb-Cholecalciferol 500-200 MG-UNIT TABS Take 1 tablet by mouth daily.     [provider]  Cyanocobalamin (B-12 SL) Place 2,500 mcg under the tongue daily.     [provider]  docusate sodium (COLACE) 100 MG capsule Take 100 mg by mouth daily as needed.     [provider]  levothyroxine (SYNTHROID, LEVOTHROID) 50 MCG tablet Take 50 mcg by mouth daily at 6 (six) AM.    [provider]  lidocaine (LIDODERM) 5 % Place 1 patch onto the skin every 12 (twelve) hours. Remove & Discard patch within 12 hours or as directed by MD Patient not taking: Reported on 02/17/2021 12/03/20 12/03/21  Olivia Dike, FNP  loratadine (CLARITIN) 10 MG tablet Take 10 mg by mouth daily as needed for allergies.     [provider]  metoprolol tartrate (LOPRESSOR) 25 MG tablet Take 1 tablet (25 mg total) by mouth 2 (two) times daily. 10/12/20   Loletha Grayer, MD  traMADol (ULTRAM) 50 MG tablet Take 1 tablet (50 mg total) by mouth every 6 (six) hours as needed. 12/03/20   Olivia Dike, FNP    Physical Exam: Vitals:   04/08/21 1245 04/08/21 1300 04/08/21 1530 04/08/21 1630  BP:  (!) 149/80 (!) 192/107 (!) 182/95  Pulse: 64 65 81 79  Resp:  18 (!) 21 (!) 22  Temp:      TempSrc:      SpO2: 100% 98% 100% 99%  Weight:      Height:       General: Not in acute distress HEENT:       Eyes: PERRL, EOMI, no scleral icterus.       ENT: No discharge from the ears and nose, no pharynx injection, no  tonsillar enlargement.        Neck: No JVD, no bruit, no mass felt. Heme: No neck lymph node enlargement. Cardiac: S1/S2, RRR, No murmurs, No gallops or rubs. Respiratory: No rales, wheezing, rhonchi or rubs. GI: Soft, nondistended, nontender, no rebound pain, no organomegaly, BS present. GU: No hematuria Ext: has trace leg edema bilaterally. 1+DP/PT pulse bilaterally. Musculoskeletal: No joint deformities, No joint redness or warmth, no limitation of ROM in spin. Skin: No rashes.  Neuro: Alert, oriented X3, cranial nerves II-XII grossly intact, moves all extremities normally. Psych: Patient is  not psychotic, no suicidal or hemocidal ideation.  Labs on Admission: I have personally reviewed following labs and imaging studies  CBC: Recent Labs  Lab 04/08/21 1159  WBC 11.9*  HGB 13.3  HCT 39.9  MCV 86.2  PLT 564   Basic Metabolic Panel: Recent Labs  Lab 04/08/21 1159 04/08/21 1435  NA 142  --   K 4.2  --   CL 107  --   CO2 22  --   GLUCOSE 112*  --   BUN 39*  --   CREATININE 1.37*  --   CALCIUM 9.9  --   MG  --  1.7   GFR: Estimated Creatinine Clearance: 23.5 mL/min (A) (by C-G formula based on SCr of 1.37 mg/dL (H)). Liver Function Tests: No results for input(s): AST, ALT, ALKPHOS, BILITOT, PROT, ALBUMIN in the last 168 hours. No results for input(s): LIPASE, AMYLASE in the last 168 hours. No results for input(s): AMMONIA in the last 168 hours. Coagulation Profile: No results for input(s): INR, PROTIME in the last 168 hours. Cardiac Enzymes: No results for input(s): CKTOTAL, CKMB, CKMBINDEX, TROPONINI in the last 168 hours. BNP (last 3 results) No results for input(s): PROBNP in the last 8760 hours. HbA1C: No results for input(s): HGBA1C in the last 72 hours. CBG: No results for input(s): GLUCAP in the last 168 hours. Lipid Profile: No results for input(s): CHOL, HDL, LDLCALC, TRIG, CHOLHDL, LDLDIRECT in the last 72 hours. Thyroid Function Tests: Recent Labs     04/08/21 1435  TSH 2.471   Anemia Panel: No results for input(s): VITAMINB12, FOLATE, FERRITIN, TIBC, IRON, RETICCTPCT in the last 72 hours. Urine analysis:    Component Value Date/Time   COLORURINE YELLOW (A) 10/10/2020 1130   APPEARANCEUR CLEAR (A) 10/10/2020 1130   LABSPEC 1.013 10/10/2020 1130   PHURINE 5.0 10/10/2020 1130   GLUCOSEU NEGATIVE 10/10/2020 1130   HGBUR NEGATIVE 10/10/2020 1130   BILIRUBINUR NEGATIVE 10/10/2020 1130   KETONESUR NEGATIVE 10/10/2020 1130   PROTEINUR NEGATIVE 10/10/2020 1130   NITRITE NEGATIVE 10/10/2020 1130   LEUKOCYTESUR NEGATIVE 10/10/2020 1130   Sepsis Labs: @LABRCNTIP (procalcitonin:4,lacticidven:4) )No results found for this or any previous visit (from the past 240 hour(s)).   Radiological Exams on Admission: DG Chest Port 1 View  Result Date: 04/08/2021 CLINICAL DATA:  Abnormal EKG, shortness of breath. EXAM: PORTABLE CHEST 1 VIEW COMPARISON:  01/01/2021. FINDINGS: Trachea is midline. Heart is enlarged. Thoracic aorta is calcified. Biapical pleural thickening. Lungs are clear. No pleural fluid. IMPRESSION: No acute findings. Electronically Signed   By: Lorin Picket M.D.   On: 04/08/2021 12:36     EKG: I have personally reviewed. SVT, QTc 451, poor R wave progression, low voltage  Assessment/Plan Principal Problem:   Elevated troponin Active Problems:   Hypothyroidism   CKD (chronic kidney disease), stage IIIb   HLD (hyperlipidemia)   Essential hypertension   Atrial fibrillation with rapid ventricular response (HCC)   Leukocytosis   Monoclonal B-cell lymphocytosis   Elevated troponin:  Trop  791 -->729. No CP.  Possibly due to demand ischemia.  Dr. Ubaldo Glassing of cardiology is consulted.  - place to progressive unit for observation - Trend Trop - Repeat EKG in the am  - Aspirin, lipitor  - Risk factor stratification: will check FLP and A1C   Atrial fibrillation with rapid ventricular response vs. SVT:  She converted with IV  diltiazem 5mg  and HR down to 70-80s. Pt is not taking anticoagulants due to frequent fall. -Metoprolol dose was  increased to 75 daily by Dr. Ubaldo Glassing -check TSH level  Hypothyroidism -New Synthroid  CKD (chronic kidney disease), stage IIIb: Recent baseline creatinine 1.32, her creatinine is 1.37, BUN 39, close to baseline -Follow-up with BMP  HLD (hyperlipidemia) -Lipitor  Essential hypertension -Metoprolol -IV hydralazine as needed  Leukocytosis and history of Monoclonal B-cell lymphocytosis: WBC 11.9.  No fever.  No signs of infection. -Patient has follow-up with Dr. Tasia Catchings of oncology    DVT ppx:  SQ Lovenox Code Status: Full code Family Communication: not done, no family member is at bed side.  Disposition Plan:  Anticipate discharge back to previous environment Consults called:  Dr. Ubaldo Glassing of card. Admission status and Level of care: Progressive Cardiac:    for obs   Status is: Observation  The patient remains OBS appropriate and will d/c before 2 midnights.  Dispo: The patient is from: Home              Anticipated d/c is to: Home              Patient currently is not medically stable to d/c.   Difficult to place patient No           Date of Service 04/08/2021    Ivor Costa [facility]   If 7PM-7AM, please contact night-coverage [email or web address] 04/08/2021, 5:01 PM

## 2021-04-08 NOTE — ED Notes (Signed)
Cell phone was given to patient at this time.

## 2021-04-09 DIAGNOSIS — R778 Other specified abnormalities of plasma proteins: Secondary | ICD-10-CM

## 2021-04-09 LAB — BASIC METABOLIC PANEL
Anion gap: 8 (ref 5–15)
BUN: 32 mg/dL — ABNORMAL HIGH (ref 8–23)
CO2: 26 mmol/L (ref 22–32)
Calcium: 9.1 mg/dL (ref 8.9–10.3)
Chloride: 106 mmol/L (ref 98–111)
Creatinine, Ser: 1.11 mg/dL — ABNORMAL HIGH (ref 0.44–1.00)
GFR, Estimated: 47 mL/min — ABNORMAL LOW (ref >60)
Glucose, Bld: 81 mg/dL (ref 70–99)
Potassium: 3.6 mmol/L (ref 3.5–5.1)
Sodium: 140 mmol/L (ref 135–145)

## 2021-04-09 LAB — HEMOGLOBIN A1C
Hgb A1c MFr Bld: 6.1 % — ABNORMAL HIGH (ref 4.8–5.6)
Mean Plasma Glucose: 128.37 mg/dL

## 2021-04-09 LAB — LIPID PANEL
Cholesterol: 130 mg/dL (ref 0–200)
HDL: 49 mg/dL (ref >40)
LDL Cholesterol: 72 mg/dL (ref 0–99)
Total CHOL/HDL Ratio: 2.7 RATIO
Triglycerides: 47 mg/dL (ref <150)
VLDL: 9 mg/dL (ref 0–40)

## 2021-04-09 LAB — CBC
HCT: 35.4 % — ABNORMAL LOW (ref 36.0–46.0)
Hemoglobin: 12.2 g/dL (ref 12.0–15.0)
MCH: 29.9 pg (ref 26.0–34.0)
MCHC: 34.5 g/dL (ref 30.0–36.0)
MCV: 86.8 fL (ref 80.0–100.0)
Platelets: 263 10*3/uL (ref 150–400)
RBC: 4.08 MIL/uL (ref 3.87–5.11)
RDW: 14.8 % (ref 11.5–15.5)
WBC: 8.9 10*3/uL (ref 4.0–10.5)
nRBC: 0 % (ref 0.0–0.2)

## 2021-04-09 MED ORDER — METOPROLOL SUCCINATE ER 25 MG PO TB24: 75.0000 mg | ORAL_TABLET | Freq: Every day | ORAL | 1 refills | Status: DC

## 2021-04-09 NOTE — Plan of Care (Signed)
  Problem: Health Behavior/Discharge Planning: Goal: Ability to manage health-related needs will improve 04/09/2021 1141 by Alen Blew, RN Outcome: Adequate for Discharge 04/09/2021 1141 by Alen Blew, RN Outcome: Adequate for Discharge   Problem: Clinical Measurements: Goal: Ability to maintain clinical measurements within normal limits will improve 04/09/2021 1141 by Alen Blew, RN Outcome: Adequate for Discharge 04/09/2021 1141 by Alen Blew, RN Outcome: Adequate for Discharge Goal: Will remain free from infection 04/09/2021 1141 by Alen Blew, RN Outcome: Adequate for Discharge 04/09/2021 1141 by Alen Blew, RN Outcome: Adequate for Discharge Goal: Diagnostic test results will improve 04/09/2021 1141 by Alen Blew, RN Outcome: Adequate for Discharge 04/09/2021 1141 by Alen Blew, RN Outcome: Adequate for Discharge Goal: Respiratory complications will improve 04/09/2021 1141 by Alen Blew, RN Outcome: Adequate for Discharge 04/09/2021 1141 by Alen Blew, RN Outcome: Adequate for Discharge Goal: Cardiovascular complication will be avoided 04/09/2021 1141 by Alen Blew, RN Outcome: Adequate for Discharge 04/09/2021 1141 by Alen Blew, RN Outcome: Adequate for Discharge   Problem: Clinical Measurements: Goal: Cardiovascular complication will be avoided 04/09/2021 1141 by Alen Blew, RN Outcome: Adequate for Discharge 04/09/2021 1141 by Alen Blew, RN Outcome: Adequate for Discharge   Problem: Activity: Goal: Risk for activity intolerance will decrease 04/09/2021 1141 by Alen Blew, RN Outcome: Adequate for Discharge 04/09/2021 1141 by Alen Blew, RN Outcome: Adequate for Discharge   Problem: Nutrition: Goal: Adequate nutrition will be maintained 04/09/2021 1141 by Alen Blew, RN Outcome: Adequate for Discharge 04/09/2021 1141 by Alen Blew, RN Outcome: Adequate for  Discharge

## 2021-04-09 NOTE — [place] Notes (Signed)
Patient Name: Olivia Lewis Date of Encounter: 04/09/2021  Hospital Problem List     Principal Problem:   Elevated troponin Active Problems:   Hypothyroidism   CKD (chronic kidney disease), stage IIIb   HLD (hyperlipidemia)   Essential hypertension   Atrial fibrillation with rapid ventricular response (HCC)   Leukocytosis   Monoclonal B-cell lymphocytosis    Patient Profile     87 y.o. female with history of atrial fibrillation which was diagnosed in November of last year.  She had a history of falling and was not felt to be a candidate for chronic anticoagulation at that time and was sent home on aspirin.  She now returns to the emergency room with some shortness of breath last night but "feels good this morning.  She presented for routine follow-up visit to her clinic and was noted to have a rapid heart rate.  She was asymptomatic.  She is currently on atorvastatin 10 mg daily, aspirin daily, metoprolol succinate 50 mg daily as an outpatient.  She denied chest pain however t  roponins drawn in the ER were elevated at 791.  BNP was 1339.  EKG at 12:00 today showed sinus rhythm with no ischemia.  Initial EKG showed A. fib.  She converted with IV diltiazem x1.  She is hemodynamically stable with a blood pressure of 149/80.  Currently in normal sinus rhythm at a rate of 81  Subjective   No new complaints.   Inpatient Medications    . aspirin EC  81 mg Oral Daily  . atorvastatin  10 mg Oral Daily  . calcium-vitamin D  1 tablet Oral Q breakfast  . enoxaparin (LOVENOX) injection  30 mg Subcutaneous Q24H  . levothyroxine  50 mcg Oral Q0600  . metoprolol succinate  75 mg Oral Daily  . vitamin B-12  2,500 mcg Oral Daily    Vital Signs    Vitals:   04/08/21 1930 04/08/21 2016 04/09/21 0339 04/09/21 0801  BP: (!) 148/80 (!) 160/85 (!) 156/73 (!) 158/93  Pulse: 63 67 (!) 48 79  Resp: 20 18 20 18   Temp:  97.6 F (36.4 C) 97.9 F (36.6 C) 97.8 F (36.6 C)  TempSrc:  Oral     SpO2: 100% 100% 96% 98%  Weight:   67 kg   Height:        Intake/Output Summary (Last 24 hours) at 04/09/2021 0946 Last data filed at 04/09/2021 0920 Gross per 24 hour  Intake --  Output 1000 ml  Net -1000 ml   Filed Weights   04/08/21 1151 04/09/21 0339  Weight: 68 kg 67 kg    Physical Exam    GEN: Well nourished, well developed, in no acute distress.  HEENT: normal.  Neck: Supple, no JVD, carotid bruits, or masses. Cardiac: RRR, no murmurs, rubs, or gallops. No clubbing, cyanosis, edema.  Radials/DP/PT 2+ and equal bilaterally.  Respiratory:  Respirations regular and unlabored, clear to auscultation bilaterally. GI: Soft, nontender, nondistended, BS + x 4. MS: no deformity or atrophy. Skin: warm and dry, no rash. Neuro:  Strength and sensation are intact. Psych: Normal affect.  Labs    CBC Recent Labs    04/08/21 1159 04/09/21 0527  WBC 11.9* 8.9  HGB 13.3 12.2  HCT 39.9 35.4*  MCV 86.2 86.8  PLT 297 342   Basic Metabolic Panel Recent Labs    04/08/21 1159 04/08/21 1435 04/09/21 0527  NA 142  --  140  K 4.2  --  3.6  CL 107  --  106  CO2 22  --  26  GLUCOSE 112*  --  81  BUN 39*  --  32*  CREATININE 1.37*  --  1.11*  CALCIUM 9.9  --  9.1  MG  --  1.7  --    Liver Function Tests No results for input(s): AST, ALT, ALKPHOS, BILITOT, PROT, ALBUMIN in the last 72 hours. No results for input(s): LIPASE, AMYLASE in the last 72 hours. Cardiac Enzymes No results for input(s): CKTOTAL, CKMB, CKMBINDEX, TROPONINI in the last 72 hours. BNP Recent Labs    04/08/21 1159  BNP 1,339.4*   D-Dimer No results for input(s): DDIMER in the last 72 hours. Hemoglobin A1C No results for input(s): HGBA1C in the last 72 hours. Fasting Lipid Panel Recent Labs    04/09/21 0527  CHOL 130  HDL 49  LDLCALC 72  TRIG 47  CHOLHDL 2.7   Thyroid Function Tests Recent Labs    04/08/21 1435  TSH 2.471    Telemetry    nsr  ECG    nsr  Radiology    DG  Chest Port 1 View  Result Date: 04/08/2021 CLINICAL DATA:  Abnormal EKG, shortness of breath. EXAM: PORTABLE CHEST 1 VIEW COMPARISON:  01/01/2021. FINDINGS: Trachea is midline. Heart is enlarged. Thoracic aorta is calcified. Biapical pleural thickening. Lungs are clear. No pleural fluid. IMPRESSION: No acute findings. Electronically Signed   By: Lorin Picket M.D.   On: 04/08/2021 12:36    Assessment & Plan    had a history of falling and was not felt to be a candidate for chronic anticoagulation at that time and was sent home on aspirin. She now returns to the emergency room with some shortness of breath last night but "feels good this morning. She presented for routine follow-up visit to her clinic and was noted to have a rapid heart rate. She was asymptomatic. She is currently on atorvastatin 10 mg daily, aspirin daily, metoprolol tartrate 25 mg twice daily. She denied chest pain however t roponins drawn in the ER were elevated at 791. BNP was 1339. EKG at 12:00 today showed sinus rhythm with no ischemia. Initial EKG showed A. fib. She converted with IV diltiazem x1. She is hemodynamically stable with a blood pressure of 149/80.  1.  A. fib-he is on metoprolol succinate 50 mg at home.  Not felt to be a candidate for chronic anticoagulation due to frequent falls and bleeding risk.  Was told asymptomatic today presented to her primary care physician for routine visit and was noted to be tachycardic.  She denied chest pain.  She currently is chest pain-free and asymptomatic.  Troponins were drawn in the ER which were significantly elevated however EKG shows no ischemia and she is asymptomatic.  This appears to be demand secondary to her tachycardia.  We will increase her metoprolol succinate to 75 mg daily and remain off of anticoagulation due to the bleeding risk as mentioned above. Does not require further inpatient cardiac workup. OK for discharge from cardiac standpoint.   2.   Hypertension-we will need to follow as we increased her dose of metoprolol.  May need to add more if her pressure and heart rate tolerated.  Signed, Javier Docker Kalee Mcclenathan MD 04/09/2021, 9:46 AM  Pager: [phone]

## 2021-04-09 NOTE — Discharge Summary (Signed)
Physician Discharge Summary  Olivia Lewis IOE:[id number] DOB: 1929/02/28 DOA: 04/08/2021  PCP: Baxter Hire, MD  Admit date: 04/08/2021 Discharge date: 04/09/2021  Admitted From: Home Disposition: Home  Recommendations for Outpatient Follow-up:  1. Follow up with PCP in 1-2 weeks 2. Follow-up with cardiology 3. Please obtain BMP/CBC in one week 4. Please follow up on the following pending results: None  Home Health: No Equipment/Devices: Discharge Condition: Stable CODE STATUS: Full Diet recommendation: Heart Healthy   Brief/Interim Summary: Olivia Lewis is a 87 y.o. female with medical history significant of A. fib not on anticoagulants, hypertension, hyperlipidemia, GERD, hypothyroidism, CKD stage IIIb, monoclonal B-cell lymphocytosis, who presents with shortness breath.  Pt stats that she has some shortness of breath since last night.  This morning she presented for routine follow-up visit to [facility] clinic and was noted to have a rapid heart rate.  Patient denies chest pain, cough, fever or chills.  No nausea vomiting, diarrhea or abdominal pain.  No symptoms of UTI. Her EKG showed SVT vs. A. Fib with RVR with HR up to 140s in ED. She converted with IV diltiazem 5mg  and HR down to 70-80s. She was also found to have elevated troponin which peaked at 791 and trending down, BNP of 1339 but patient appears euvolemic on clinic exam.  Saturating well on room air.  Chest x-ray without any acute abnormality. Cardiology was consulted and they increase the dose of metoprolol to 75 mg daily and she will have a close follow-up with her cardiologist for further management.  Patient is not a candidate for anticoagulation due to history of frequent falls.  She will continue with rest of her home medications and follow-up with her providers.  Discharge Diagnoses:  Principal Problem:   Elevated troponin Active Problems:   Hypothyroidism   CKD (chronic kidney disease), stage IIIb   HLD  (hyperlipidemia)   Essential hypertension   Atrial fibrillation with rapid ventricular response (HCC)   Leukocytosis   Monoclonal B-cell lymphocytosis   Discharge Instructions  Discharge Instructions    Diet - low sodium heart healthy   Complete by: As directed    Discharge instructions   Complete by: As directed    It was pleasure taking care of you. Your cardiologist increase the dose of metoprolol, please start taking your new dose and follow-up closely with your cardiologist for further management.   Increase activity slowly   Complete by: As directed      Allergies as of 04/09/2021      Reactions   Penicillin V Potassium Other (See Comments)   Has patient had a PCN reaction causing immediate rash, facial/tongue/throat swelling, SOB or lightheadedness with hypotension: Unknown Has patient had a PCN reaction causing severe rash involving mucus membranes or skin necrosis: Unknown Has patient had a PCN reaction that required hospitalization: Unknown Has patient had a PCN reaction occurring within the last 10 years: No If all of the above answers are "NO", then may proceed with Cephalosporin use.   Sulfa Antibiotics Other (See Comments)   headache      Medication List    STOP taking these medications   metoprolol tartrate 25 MG tablet Commonly known as: LOPRESSOR     TAKE these medications   acetaminophen 500 MG tablet Commonly known as: TYLENOL Take 1,000 mg by mouth daily as needed.   aspirin EC 81 MG tablet Take 81 mg by mouth daily.   atorvastatin 10 MG tablet Commonly known as: LIPITOR Take 10  mg by mouth daily.   B-12 SL Place 2,500 mcg under the tongue daily.   Calcium Carb-Cholecalciferol 500-200 MG-UNIT Tabs Take 1 tablet by mouth daily.   docusate sodium 100 MG capsule Commonly known as: COLACE Take 100 mg by mouth daily as needed.   levothyroxine 50 MCG tablet Commonly known as: SYNTHROID Take 50 mcg by mouth daily at 6 (six) AM.   lidocaine  5 % Commonly known as: Lidoderm Place 1 patch onto the skin every 12 (twelve) hours. Remove & Discard patch within 12 hours or as directed by MD   loratadine 10 MG tablet Commonly known as: CLARITIN Take 10 mg by mouth daily as needed for allergies.   metoprolol succinate 25 MG 24 hr tablet Commonly known as: TOPROL-XL Take 3 tablets (75 mg total) by mouth daily.   traMADol 50 MG tablet Commonly known as: Ultram Take 1 tablet (50 mg total) by mouth every 6 (six) hours as needed.       Follow-up Information    Baxter Hire, MD. Schedule an appointment as soon as possible for a visit.   Specialty: Internal Medicine Contact information: [place] [address] [phone]        Teodoro Spray, MD. Schedule an appointment as soon as possible for a visit in 1 week(s).   Specialty: Cardiology Contact information: [address] [place] [address] [phone]              Allergies  Allergen Reactions  . Penicillin V Potassium Other (See Comments)    Has patient had a PCN reaction causing immediate rash, facial/tongue/throat swelling, SOB or lightheadedness with hypotension: Unknown Has patient had a PCN reaction causing severe rash involving mucus membranes or skin necrosis: Unknown Has patient had a PCN reaction that required hospitalization: Unknown Has patient had a PCN reaction occurring within the last 10 years: No If all of the above answers are "NO", then may proceed with Cephalosporin use.   . Sulfa Antibiotics Other (See Comments)    headache    Consultations:  Cardiology  Procedures/Studies: DG Chest Port 1 View  Result Date: 04/08/2021 CLINICAL DATA:  Abnormal EKG, shortness of breath. EXAM: PORTABLE CHEST 1 VIEW COMPARISON:  01/01/2021. FINDINGS: Trachea is midline. Heart is enlarged. Thoracic aorta is calcified. Biapical pleural thickening. Lungs are clear. No pleural fluid. IMPRESSION: No acute findings.  Electronically Signed   By: Lorin Picket M.D.   On: 04/08/2021 12:36     Subjective: Patient was seen and examined today.  Denies any chest pain or shortness of breath.  Feeling better, she thinks that she is at her baseline and wants to go home.  She has an upcoming orthopedic appointment at 3 PM and does not want to miss it.  Denies any orthopnea or PND. Clinically appears euvolemic.  Discharge Exam: Vitals:   04/09/21 0339 04/09/21 0801  BP: (!) 156/73 (!) 158/93  Pulse: (!) 48 79  Resp: 20 18  Temp: 97.9 F (36.6 C) 97.8 F (36.6 C)  SpO2: 96% 98%   Vitals:   04/08/21 1930 04/08/21 2016 04/09/21 0339 04/09/21 0801  BP: (!) 148/80 (!) 160/85 (!) 156/73 (!) 158/93  Pulse: 63 67 (!) 48 79  Resp: 20 18 20 18   Temp:  97.6 F (36.4 C) 97.9 F (36.6 C) 97.8 F (36.6 C)  TempSrc:  Oral    SpO2: 100% 100% 96% 98%  Weight:   67 kg   Height:  General: Pt is alert, awake, not in acute distress Cardiovascular: RRR, S1/S2 +, no rubs, no gallops Respiratory: CTA bilaterally, no wheezing, no rhonchi Abdominal: Soft, NT, ND, bowel sounds + Extremities: no edema, no cyanosis   The results of significant diagnostics from this hospitalization (including imaging, microbiology, ancillary and laboratory) are listed below for reference.    Microbiology: Recent Results (from the past 240 hour(s))  SARS CORONAVIRUS 2 (TAT 6-24 HRS) Nasopharyngeal Nasopharyngeal Swab     Status: None   Collection Time: 04/08/21  2:03 PM   Specimen: Nasopharyngeal Swab  Result Value Ref Range Status   SARS Coronavirus 2 NEGATIVE NEGATIVE Final    Comment: (NOTE) SARS-CoV-2 target nucleic acids are NOT DETECTED.  The SARS-CoV-2 RNA is generally detectable in upper and lower respiratory specimens during the acute phase of infection. Negative results do not preclude SARS-CoV-2 infection, do not rule out co-infections with other pathogens, and should not be used as the sole basis for treatment  or other patient management decisions. Negative results must be combined with clinical observations, patient history, and epidemiological information. The expected result is Negative.  Fact Sheet for Patients: SugarRoll.be  Fact Sheet for Healthcare Providers: https://www.woods-mathews.com/  This test is not yet approved or cleared by the [place] FDA and  has been authorized for detection and/or diagnosis of SARS-CoV-2 by FDA under an Emergency Use Authorization (EUA). This EUA will remain  in effect (meaning this test can be used) for the duration of the COVID-19 declaration under Se ction 564(b)(1) of the Act, 21 U.S.C. section 360bbb-3(b)(1), unless the authorization is terminated or revoked sooner.  Performed at [facility], [place] [address], [place], [place] [address]      Labs: BNP (last 3 results) Recent Labs    10/10/20 1610 04/08/21 1159  BNP 831.1* 123456*   Basic Metabolic Panel: Recent Labs  Lab 04/08/21 1159 04/08/21 1435 04/09/21 0527  NA 142  --  140  K 4.2  --  3.6  CL 107  --  106  CO2 22  --  26  GLUCOSE 112*  --  81  BUN 39*  --  32*  CREATININE 1.37*  --  1.11*  CALCIUM 9.9  --  9.1  MG  --  1.7  --    Liver Function Tests: No results for input(s): AST, ALT, ALKPHOS, BILITOT, PROT, ALBUMIN in the last 168 hours. No results for input(s): LIPASE, AMYLASE in the last 168 hours. No results for input(s): AMMONIA in the last 168 hours. CBC: Recent Labs  Lab 04/08/21 1159 04/09/21 0527  WBC 11.9* 8.9  HGB 13.3 12.2  HCT 39.9 35.4*  MCV 86.2 86.8  PLT 297 263   Cardiac Enzymes: No results for input(s): CKTOTAL, CKMB, CKMBINDEX, TROPONINI in the last 168 hours. BNP: Invalid input(s): POCBNP CBG: No results for input(s): GLUCAP in the last 168 hours. D-Dimer No results for input(s): DDIMER in the last 72 hours. Hgb A1c Recent Labs    04/09/21 0527  HGBA1C 6.1*   Lipid  Profile Recent Labs    04/09/21 0527  CHOL 130  HDL 49  LDLCALC 72  TRIG 47  CHOLHDL 2.7   Thyroid function studies Recent Labs    04/08/21 1435  TSH 2.471   Anemia work up No results for input(s): VITAMINB12, FOLATE, FERRITIN, TIBC, IRON, RETICCTPCT in the last 72 hours. Urinalysis    Component Value Date/Time   COLORURINE YELLOW (A) 10/10/2020 1130   APPEARANCEUR CLEAR (A)  10/10/2020 1130   LABSPEC 1.013 10/10/2020 1130   PHURINE 5.0 10/10/2020 1130   GLUCOSEU NEGATIVE 10/10/2020 1130   HGBUR NEGATIVE 10/10/2020 1130   BILIRUBINUR NEGATIVE 10/10/2020 1130   KETONESUR NEGATIVE 10/10/2020 1130   PROTEINUR NEGATIVE 10/10/2020 1130   NITRITE NEGATIVE 10/10/2020 1130   LEUKOCYTESUR NEGATIVE 10/10/2020 1130   Sepsis Labs Invalid input(s): PROCALCITONIN,  WBC,  LACTICIDVEN Microbiology Recent Results (from the past 240 hour(s))  SARS CORONAVIRUS 2 (TAT 6-24 HRS) Nasopharyngeal Nasopharyngeal Swab     Status: None   Collection Time: 04/08/21  2:03 PM   Specimen: Nasopharyngeal Swab  Result Value Ref Range Status   SARS Coronavirus 2 NEGATIVE NEGATIVE Final    Comment: (NOTE) SARS-CoV-2 target nucleic acids are NOT DETECTED.  The SARS-CoV-2 RNA is generally detectable in upper and lower respiratory specimens during the acute phase of infection. Negative results do not preclude SARS-CoV-2 infection, do not rule out co-infections with other pathogens, and should not be used as the sole basis for treatment or other patient management decisions. Negative results must be combined with clinical observations, patient history, and epidemiological information. The expected result is Negative.  Fact Sheet for Patients: SugarRoll.be  Fact Sheet for Healthcare Providers: https://www.woods-mathews.com/  This test is not yet approved or cleared by the [place] FDA and  has been authorized for detection and/or diagnosis of SARS-CoV-2  by FDA under an Emergency Use Authorization (EUA). This EUA will remain  in effect (meaning this test can be used) for the duration of the COVID-19 declaration under Se ction 564(b)(1) of the Act, 21 U.S.C. section 360bbb-3(b)(1), unless the authorization is terminated or revoked sooner.  Performed at [facility], [place] [address] [place], [place] [address]     Time coordinating discharge: Over 30 minutes  SIGNED:  Lorella Nimrod, MD  [facility] 04/09/2021, 11:12 AM  If 7PM-7AM, please contact night-coverage [email or web address]  This record has been created using Systems analyst. Errors have been sought and corrected,but may not always be located. Such creation errors do not reflect on the standard of care.

## 2021-05-01 ENCOUNTER — Other Ambulatory Visit: Payer: Self-pay

## 2021-05-01 ENCOUNTER — Ambulatory Visit (INDEPENDENT_AMBULATORY_CARE_PROVIDER_SITE_OTHER): Payer: Medicare Other | Admitting: Dermatology

## 2021-05-01 ENCOUNTER — Encounter: Payer: Self-pay | Admitting: Dermatology

## 2021-05-01 DIAGNOSIS — L578 Other skin changes due to chronic exposure to nonionizing radiation: Secondary | ICD-10-CM | POA: Diagnosis not present

## 2021-05-01 DIAGNOSIS — L570 Actinic keratosis: Secondary | ICD-10-CM

## 2021-05-01 DIAGNOSIS — L820 Inflamed seborrheic keratosis: Secondary | ICD-10-CM

## 2021-05-01 DIAGNOSIS — D692 Other nonthrombocytopenic purpura: Secondary | ICD-10-CM

## 2021-05-01 DIAGNOSIS — C44622 Squamous cell carcinoma of skin of right upper limb, including shoulder: Secondary | ICD-10-CM

## 2021-05-01 DIAGNOSIS — C4492 Squamous cell carcinoma of skin, unspecified: Secondary | ICD-10-CM

## 2021-05-01 DIAGNOSIS — D492 Neoplasm of unspecified behavior of bone, soft tissue, and skin: Secondary | ICD-10-CM

## 2021-05-01 DIAGNOSIS — Z85828 Personal history of other malignant neoplasm of skin: Secondary | ICD-10-CM | POA: Diagnosis not present

## 2021-05-01 DIAGNOSIS — D0471 Carcinoma in situ of skin of right lower limb, including hip: Secondary | ICD-10-CM | POA: Diagnosis not present

## 2021-05-01 DIAGNOSIS — C44629 Squamous cell carcinoma of skin of left upper limb, including shoulder: Secondary | ICD-10-CM

## 2021-05-01 HISTORY — DX: Squamous cell carcinoma of skin, unspecified: C44.92

## 2021-05-01 NOTE — Patient Instructions (Addendum)
If you have any questions or concerns for your doctor, please call our main line at [phone] and press option 4 to reach your doctor's medical assistant. If no one answers, please leave a voicemail as directed and we will return your call as soon as possible. Messages left after 4 pm will be answered the following business day.   You may also send us a message via MyChart. We typically respond to MyChart messages within 1-2 business days.  For prescription refills, please ask your pharmacy to contact our office. Our fax number is [phone].  If you have an urgent issue when the clinic is closed that cannot wait until the next business day, you can page your doctor at the number below.    Please note that while we do our best to be available for urgent issues outside of office hours, we are not available 24/7.   If you have an urgent issue and are unable to reach us, you may choose to seek medical care at your doctor's office, retail clinic, urgent care center, or emergency room.  If you have a medical emergency, please immediately call 911 or go to the emergency department.  Pager Numbers  - Dr. Kowalski: [phone]  - Dr. Moye: [phone]  - Dr. Stewart: [phone]  In the event of inclement weather, please call our main line at [phone] for an update on the status of any delays or closures.  Dermatology Medication Tips: Please keep the boxes that topical medications come in in order to help keep track of the instructions about where and how to use these. Pharmacies typically print the medication instructions only on the boxes and not directly on the medication tubes.   If your medication is too expensive, please contact our office at [phone] option 4 or send us a message through MyChart.   We are unable to tell what your co-pay for medications will be in advance as this is different depending on your insurance coverage. However, we may be able to find a substitute  medication at lower cost or fill out paperwork to get insurance to cover a needed medication.   If a prior authorization is required to get your medication covered by your insurance company, please allow us 1-2 business days to complete this process.  Drug prices often vary depending on where the prescription is filled and some pharmacies may offer cheaper prices.  The website www.goodrx.com contains coupons for medications through different pharmacies. The prices here do not account for what the cost may be with help from insurance (it may be cheaper with your insurance), but the website can give you the price if you did not use any insurance.  - You can print the associated coupon and take it with your prescription to the pharmacy.  - You may also stop by our office during regular business hours and pick up a GoodRx coupon card.  - If you need your prescription sent electronically to a different pharmacy, notify our office through [facility] MyChart or by phone at [phone] option 4.   Wound Care Instructions  Cleanse wound gently with soap and water once a day then pat dry with clean gauze. Apply a thing coat of Petrolatum (petroleum jelly, "Vaseline") over the wound (unless you have an allergy to this). We recommend that you use a new, sterile tube of Vaseline. Do not pick or remove scabs. Do not remove the yellow or white "healing tissue" from the base of the wound.  Cover the   wound with fresh, clean, nonstick gauze and secure with paper tape. You may use Band-Aids in place of gauze and tape if the would is small enough, but would recommend trimming much of the tape off as there is often too much. Sometimes Band-Aids can irritate the skin.  You should call the office for your biopsy report after 1 week if you have not already been contacted.  If you experience any problems, such as abnormal amounts of bleeding, swelling, significant bruising, significant pain, or evidence of infection,  please call the office immediately.  FOR ADULT SURGERY PATIENTS: If you need something for pain relief you may take 1 extra strength Tylenol (acetaminophen) AND 2 Ibuprofen (200mg each) together every 4 hours as needed for pain. (do not take these if you are allergic to them or if you have a reason you should not take them.) Typically, you may only need pain medication for 1 to 3 days.    

## 2021-05-01 NOTE — Progress Notes (Signed)
New Patient Visit  Subjective  Olivia Lewis is a 87 y.o. female who presents for the following: check spots (Arms, R lower leg, R post auricular one on leg gets rubbed). New patient referral from Dr. Harrel Lemon.  The following portions of the chart were reviewed this encounter and updated as appropriate:   Tobacco  Allergies  Meds  Problems  Med Hx  Surg Hx  Fam Hx      Review of Systems:  No other skin or systemic complaints except as noted in HPI or Assessment and Plan.  Objective  Well appearing patient in no apparent distress; mood and affect are within normal limits.  A focused examination was performed including face, arms, neck, right leg. Relevant physical exam findings are noted in the Assessment and Plan.  R distal tricep at elbow 1.2cm cutaneous horn  Left Forearm near elbow Cutaneous horn 1.5  Right dorsum hand 1.2cm cutaneous horn  L mid lat forearm 1.2cm cutaneous horn  R calf Crusted plaque 2.0cm  face, neck x 17, Total = 17 Erythematous keratotic or waxy stuck-on papule or plaque.   face x 16 (16) Pink scaly macules    Assessment & Plan  Neoplasm of skin (5) R distal tricep at elbow  Epidermal / dermal shaving  Lesion diameter (cm):  1.2 Informed consent: discussed and consent obtained   Timeout: patient name, date of birth, surgical site, and procedure verified   Procedure prep:  Patient was prepped and draped in usual sterile fashion Prep type:  Isopropyl alcohol Anesthesia: the lesion was anesthetized in a standard fashion   Anesthetic:  1% lidocaine w/ epinephrine 1-100,000 buffered w/ 8.4% NaHCO3 Instrument used: flexible razor blade   Hemostasis achieved with: pressure, aluminum chloride and electrodesiccation   Outcome: patient tolerated procedure well   Post-procedure details: sterile dressing applied and wound care instructions given   Dressing type: bandage and bacitracin    Destruction of lesion Complexity:  extensive   Destruction method: electrodesiccation and curettage   Informed consent: discussed and consent obtained   Timeout:  patient name, date of birth, surgical site, and procedure verified Procedure prep:  Patient was prepped and draped in usual sterile fashion Prep type:  Isopropyl alcohol Anesthesia: the lesion was anesthetized in a standard fashion   Anesthetic:  1% lidocaine w/ epinephrine 1-100,000 buffered w/ 8.4% NaHCO3 Curettage performed in three different directions: Yes   Electrodesiccation performed over the curetted area: Yes   Lesion length (cm):  1.2 Lesion width (cm):  1.2 Margin per side (cm):  0.2 Final wound size (cm):  1.6 Hemostasis achieved with:  pressure, aluminum chloride and electrodesiccation Outcome: patient tolerated procedure well with no complications   Post-procedure details: sterile dressing applied and wound care instructions given   Dressing type: bandage and bacitracin    Specimen 1 - Surgical pathology Differential Diagnosis: D48.5 Cutaneous Horn r/o SCC  Check Margins: No 1.2cm cutaneous horn EDC  Left Forearm near elbow  Epidermal / dermal shaving  Lesion diameter (cm):  1.5 Informed consent: discussed and consent obtained   Timeout: patient name, date of birth, surgical site, and procedure verified   Procedure prep:  Patient was prepped and draped in usual sterile fashion Prep type:  Isopropyl alcohol Anesthesia: the lesion was anesthetized in a standard fashion   Anesthetic:  1% lidocaine w/ epinephrine 1-100,000 buffered w/ 8.4% NaHCO3 Instrument used: flexible razor blade   Hemostasis achieved with: pressure, aluminum chloride and electrodesiccation   Outcome: patient  tolerated procedure well   Post-procedure details: sterile dressing applied and wound care instructions given   Dressing type: bandage and bacitracin    Destruction of lesion Complexity: extensive   Destruction method: electrodesiccation and curettage    Informed consent: discussed and consent obtained   Timeout:  patient name, date of birth, surgical site, and procedure verified Procedure prep:  Patient was prepped and draped in usual sterile fashion Prep type:  Isopropyl alcohol Anesthesia: the lesion was anesthetized in a standard fashion   Anesthetic:  1% lidocaine w/ epinephrine 1-100,000 buffered w/ 8.4% NaHCO3 Curettage performed in three different directions: Yes   Electrodesiccation performed over the curetted area: Yes   Lesion length (cm):  1.5 Lesion width (cm):  1.5 Margin per side (cm):  0.2 Final wound size (cm):  1.9 Hemostasis achieved with:  pressure, aluminum chloride and electrodesiccation Outcome: patient tolerated procedure well with no complications   Post-procedure details: sterile dressing applied and wound care instructions given   Dressing type: bandage and bacitracin    Specimen 2 - Surgical pathology Differential Diagnosis: D48.5 Cutaneous horn r/o SCC  Check Margins: No Cutaneous horn 1.5 EDC  Right dorsum hand  Epidermal / dermal shaving  Lesion diameter (cm):  1.2 Informed consent: discussed and consent obtained   Timeout: patient name, date of birth, surgical site, and procedure verified   Procedure prep:  Patient was prepped and draped in usual sterile fashion Prep type:  Isopropyl alcohol Anesthesia: the lesion was anesthetized in a standard fashion   Anesthetic:  1% lidocaine w/ epinephrine 1-100,000 buffered w/ 8.4% NaHCO3 Instrument used: flexible razor blade   Hemostasis achieved with: pressure, aluminum chloride and electrodesiccation   Outcome: patient tolerated procedure well   Post-procedure details: sterile dressing applied and wound care instructions given   Dressing type: bandage and bacitracin    Destruction of lesion Complexity: extensive   Destruction method: electrodesiccation and curettage   Informed consent: discussed and consent obtained   Timeout:  patient name, date  of birth, surgical site, and procedure verified Procedure prep:  Patient was prepped and draped in usual sterile fashion Prep type:  Isopropyl alcohol Anesthesia: the lesion was anesthetized in a standard fashion   Anesthetic:  1% lidocaine w/ epinephrine 1-100,000 buffered w/ 8.4% NaHCO3 Curettage performed in three different directions: Yes   Electrodesiccation performed over the curetted area: Yes   Lesion length (cm):  1.2 Lesion width (cm):  1.2 Margin per side (cm):  0.2 Final wound size (cm):  1.6 Hemostasis achieved with:  pressure, aluminum chloride and electrodesiccation Outcome: patient tolerated procedure well with no complications   Post-procedure details: sterile dressing applied and wound care instructions given   Dressing type: bandage and bacitracin    Specimen 3 - Surgical pathology Differential Diagnosis: Cutanteous horn r/o SCC  Check Margins: No 1.2cm cutaneous horn EDC  L mid lat forearm  Epidermal / dermal shaving  Lesion diameter (cm):  1.2 Informed consent: discussed and consent obtained   Timeout: patient name, date of birth, surgical site, and procedure verified   Procedure prep:  Patient was prepped and draped in usual sterile fashion Prep type:  Isopropyl alcohol Anesthesia: the lesion was anesthetized in a standard fashion   Anesthetic:  1% lidocaine w/ epinephrine 1-100,000 buffered w/ 8.4% NaHCO3 Instrument used: flexible razor blade   Hemostasis achieved with: pressure, aluminum chloride and electrodesiccation   Outcome: patient tolerated procedure well   Post-procedure details: sterile dressing applied and wound care instructions given  Dressing type: bandage and bacitracin    Destruction of lesion Complexity: extensive   Destruction method: electrodesiccation and curettage   Informed consent: discussed and consent obtained   Timeout:  patient name, date of birth, surgical site, and procedure verified Procedure prep:  Patient was prepped  and draped in usual sterile fashion Prep type:  Isopropyl alcohol Anesthesia: the lesion was anesthetized in a standard fashion   Anesthetic:  1% lidocaine w/ epinephrine 1-100,000 buffered w/ 8.4% NaHCO3 Curettage performed in three different directions: Yes   Electrodesiccation performed over the curetted area: Yes   Lesion length (cm):  1.2 Lesion width (cm):  1.2 Margin per side (cm):  0.2 Final wound size (cm):  1.6 Hemostasis achieved with:  pressure, aluminum chloride and electrodesiccation Outcome: patient tolerated procedure well with no complications   Post-procedure details: sterile dressing applied and wound care instructions given   Dressing type: bandage and bacitracin    Specimen 4 - Surgical pathology Differential Diagnosis: D48.5 Cutaneous horn r/o SCC  Check Margins: No 1.2cm cutaneous horn EDC  R calf  Skin / nail biopsy Type of biopsy: tangential   Informed consent: discussed and consent obtained   Timeout: patient name, date of birth, surgical site, and procedure verified   Procedure prep:  Patient was prepped and draped in usual sterile fashion Prep type:  Isopropyl alcohol Anesthesia: the lesion was anesthetized in a standard fashion   Anesthetic:  1% lidocaine w/ epinephrine 1-100,000 buffered w/ 8.4% NaHCO3 Instrument used: flexible razor blade   Outcome: patient tolerated procedure well   Post-procedure details: sterile dressing applied and wound care instructions given   Dressing type: bandage and bacitracin    Specimen 5 - Surgical pathology Differential Diagnosis: D48.5 R/O BCC  Check Margins: No Crusted plaque 2.0cm  Inflamed seborrheic keratosis face, neck x 17, Total = 17  Destruction of lesion - face, neck x 17, Total = 17 Complexity: simple   Destruction method: cryotherapy   Informed consent: discussed and consent obtained   Timeout:  patient name, date of birth, surgical site, and procedure verified Lesion destroyed using liquid  nitrogen: Yes   Region frozen until ice ball extended beyond lesion: Yes   Outcome: patient tolerated procedure well with no complications   Post-procedure details: wound care instructions given    AK (actinic keratosis) (16) face x 16  Destruction of lesion - face x 16 Complexity: simple   Destruction method: cryotherapy   Informed consent: discussed and consent obtained   Timeout:  patient name, date of birth, surgical site, and procedure verified Lesion destroyed using liquid nitrogen: Yes   Region frozen until ice ball extended beyond lesion: Yes   Outcome: patient tolerated procedure well with no complications   Post-procedure details: wound care instructions given    History of Basal Cell Carcinoma of the Skin - No evidence of recurrence today - Recommend regular full body skin exams - Recommend daily broad spectrum sunscreen SPF 30+ to sun-exposed areas, reapply every 2 hours as needed.  - Call if any new or changing lesions are noted between office visits   Purpura - Chronic; persistent and recurrent.  Treatable, but not curable. - Violaceous macules and patches - Benign - Related to trauma, age, sun damage and/or use of blood thinners, chronic use of topical and/or oral steroids - Observe - Can use OTC arnica containing moisturizer such as Dermend Bruise Formula if desired - Call for worsening or other concerns  Actinic Damage - chronic, secondary to  cumulative UV radiation exposure/sun exposure over time - diffuse scaly erythematous macules with underlying dyspigmentation - Recommend daily broad spectrum sunscreen SPF 30+ to sun-exposed areas, reapply every 2 hours as needed.  - Recommend staying in the shade or wearing long sleeves, sun glasses (UVA+UVB protection) and wide brim hats (4-inch brim around the entire circumference of the hat). - Call for new or changing lesions.  Return for pending bx results.  I, Othelia Pulling, RMA, am acting as scribe for Sarina Ser, MD . Documentation: I have reviewed the above documentation for accuracy and completeness, and I agree with the above.  Sarina Ser, MD

## 2021-05-05 ENCOUNTER — Encounter: Payer: Self-pay | Admitting: Dermatology

## 2021-05-07 ENCOUNTER — Telehealth: Payer: Self-pay

## 2021-05-07 NOTE — Telephone Encounter (Signed)
-----   Message from Ralene Bathe, MD sent at 05/06/2021 12:56 PM EDT ----- Diagnosis 1. Skin , right distal tricep at elbow WELL DIFFERENTIATED SQUAMOUS CELL CARCINOMA, BASE INVOLVED 2. Skin , left forearm near elbow WELL DIFFERENTIATED SQUAMOUS CELL CARCINOMA, BASE INVOLVED 3. Skin , right dorsum hand WELL DIFFERENTIATED SQUAMOUS CELL CARCINOMA, BASE INVOLVED 4. Skin , left mid lat forearm WELL DIFFERENTIATED SQUAMOUS CELL CARCINOMA, BASE INVOLVED 5. Skin , right calf SQUAMOUS CELL CARCINOMA IN SITU, HYPERTROPHIC, BASE INVOLVED  1,2,3,4 - all 4 Cancer - SCC All 4 already treated Recheck next visit 5- Cancer - SCC in situ Superficial Schedule for treatment (EDC) - may do that at next visit if not longer than 4 mos away.

## 2021-05-07 NOTE — Telephone Encounter (Signed)
Advised patient of results and scheduled for EDC/hd  

## 2021-07-14 ENCOUNTER — Other Ambulatory Visit: Payer: Self-pay

## 2021-07-14 ENCOUNTER — Ambulatory Visit (INDEPENDENT_AMBULATORY_CARE_PROVIDER_SITE_OTHER): Payer: Medicare Other | Admitting: Dermatology

## 2021-07-14 ENCOUNTER — Encounter: Payer: Self-pay | Admitting: Dermatology

## 2021-07-14 DIAGNOSIS — C44722 Squamous cell carcinoma of skin of right lower limb, including hip: Secondary | ICD-10-CM

## 2021-07-14 DIAGNOSIS — D0471 Carcinoma in situ of skin of right lower limb, including hip: Secondary | ICD-10-CM | POA: Diagnosis not present

## 2021-07-14 DIAGNOSIS — D099 Carcinoma in situ, unspecified: Secondary | ICD-10-CM

## 2021-07-14 DIAGNOSIS — D485 Neoplasm of uncertain behavior of skin: Secondary | ICD-10-CM

## 2021-07-14 MED ORDER — MUPIROCIN 2 % EX OINT: 1.0000 "application " | TOPICAL_OINTMENT | Freq: Every day | CUTANEOUS | 0 refills | Status: DC

## 2021-07-14 NOTE — Progress Notes (Signed)
   Follow-Up Visit   Subjective  Olivia Lewis is a 87 y.o. female who presents for the following: Skin Problem (Patient here today for a bx site at right posterior calf that is not healing. Patient scheduled for [facility] 08/18/21 to treat SQUAMOUS CELL CARCINOMA IN SITU, HYPERTROPHIC.).  The following portions of the chart were reviewed this encounter and updated as appropriate:   Tobacco  Allergies  Meds  Problems  Med Hx  Surg Hx  Fam Hx      Review of Systems:  No other skin or systemic complaints except as noted in HPI or Assessment and Plan.  Objective  Well appearing patient in no apparent distress; mood and affect are within normal limits.  A focused examination was performed including right calf. Relevant physical exam findings are noted in the Assessment and Plan.  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 With dressing changes  Destruction of lesion  Destruction method: electrodesiccation and curettage   Informed consent: discussed and consent obtained   Timeout:  patient name, date of birth, surgical site, and procedure verified Patient was prepped and draped in usual sterile fashion: area prepped with isopropyl alcohol. Anesthesia: the lesion was anesthetized in a standard fashion   Anesthetic:  1% lidocaine w/ epinephrine 1-100,000 buffered w/ 8.4% NaHCO3 Curettage performed in three different directions: Yes   Electrodesiccation performed over the curetted area: Yes   Curettage cycles:  3 Final wound size (cm):  2.6 Hemostasis achieved with:  electrodesiccation Outcome: patient tolerated procedure well with no complications   Post-procedure details: wound care instructions given   Additional details:  Mupirocin and a pressure dressing  applied  Neoplasm of uncertain behavior of skin Right Superior Calf  Skin / nail biopsy Type of biopsy: tangential   Informed consent: discussed and consent obtained   Timeout: patient name, date of birth, surgical site, and procedure verified   Patient was prepped and draped in usual sterile fashion: Area prepped with isopropyl alcohol. Anesthesia: the lesion was anesthetized in a standard fashion   Anesthetic:  1% lidocaine w/ epinephrine 1-100,000 buffered w/ 8.4% NaHCO3 Instrument used: flexible razor blade   Hemostasis achieved with: aluminum chloride   Outcome: patient tolerated procedure well   Post-procedure details: wound care instructions given   Additional details:  Mupirocin and a bandage applied  Specimen 1 - Surgical pathology Differential Diagnosis: R/o SCCis  Check Margins: No 1.6cm scaly pink plaque   Return for as scheduled.  Graciella Belton, RMA, am acting as scribe for Forest Gleason, MD .  Documentation: I have reviewed the above documentation for accuracy and completeness, and I agree with the above.  Forest Gleason, MD

## 2021-07-14 NOTE — Patient Instructions (Signed)
Wound Care Instructions  Cleanse wound gently with soap and water once a day then pat dry with clean gauze. Apply a thing coat of Petrolatum (petroleum jelly, "Vaseline") over the wound (unless you have an allergy to this). We recommend that you use a new, sterile tube of Vaseline. Do not pick or remove scabs. Do not remove the yellow or white "healing tissue" from the base of the wound.  Cover the wound with fresh, clean, nonstick gauze and secure with paper tape. You may use Band-Aids in place of gauze and tape if the would is small enough, but would recommend trimming much of the tape off as there is often too much. Sometimes Band-Aids can irritate the skin.  You should call the office for your biopsy report after 1 week if you have not already been contacted.  If you experience any problems, such as abnormal amounts of bleeding, swelling, significant bruising, significant pain, or evidence of infection, please call the office immediately.  FOR ADULT SURGERY PATIENTS: If you need something for pain relief you may take 1 extra strength Tylenol (acetaminophen) AND 2 Ibuprofen (200mg each) together every 4 hours as needed for pain. (do not take these if you are allergic to them or if you have a reason you should not take them.) Typically, you may only need pain medication for 1 to 3 days.   If you have any questions or concerns for your doctor, please call our main line at [phone] and press option 4 to reach your doctor's medical assistant. If no one answers, please leave a voicemail as directed and we will return your call as soon as possible. Messages left after 4 pm will be answered the following business day.   You may also send us a message via MyChart. We typically respond to MyChart messages within 1-2 business days.  For prescription refills, please ask your pharmacy to contact our office. Our fax number is [phone].  If you have an urgent issue when the clinic is closed that  cannot wait until the next business day, you can page your doctor at the number below.    Please note that while we do our best to be available for urgent issues outside of office hours, we are not available 24/7.   If you have an urgent issue and are unable to reach us, you may choose to seek medical care at your doctor's office, retail clinic, urgent care center, or emergency room.  If you have a medical emergency, please immediately call 911 or go to the emergency department.  Pager Numbers  - Dr. Kowalski: [phone]  - Dr. Moye: [phone]  - Dr. Stewart: [phone]  In the event of inclement weather, please call our main line at [phone] for an update on the status of any delays or closures.  Dermatology Medication Tips: Please keep the boxes that topical medications come in in order to help keep track of the instructions about where and how to use these. Pharmacies typically print the medication instructions only on the boxes and not directly on the medication tubes.   If your medication is too expensive, please contact our office at [phone] option 4 or send us a message through MyChart.   We are unable to tell what your co-pay for medications will be in advance as this is different depending on your insurance coverage. However, we may be able to find a substitute medication at lower cost or fill out paperwork to get insurance to cover a needed   medication.   If a prior authorization is required to get your medication covered by your insurance company, please allow us 1-2 business days to complete this process.  Drug prices often vary depending on where the prescription is filled and some pharmacies may offer cheaper prices.  The website www.goodrx.com contains coupons for medications through different pharmacies. The prices here do not account for what the cost may be with help from insurance (it may be cheaper with your insurance), but the website can give you the  price if you did not use any insurance.  - You can print the associated coupon and take it with your prescription to the pharmacy.  - You may also stop by our office during regular business hours and pick up a GoodRx coupon card.  - If you need your prescription sent electronically to a different pharmacy, notify our office through [place] MyChart or by phone at [phone] option 4.   

## 2021-07-18 ENCOUNTER — Encounter: Payer: Self-pay | Admitting: Dermatology

## 2021-08-11 ENCOUNTER — Inpatient Hospital Stay (HOSPITAL_BASED_OUTPATIENT_CLINIC_OR_DEPARTMENT_OTHER): Payer: Medicare Other | Admitting: Nurse Practitioner

## 2021-08-11 ENCOUNTER — Inpatient Hospital Stay: Payer: Medicare Other | Attending: Oncology

## 2021-08-11 VITALS — BP 169/124 | HR 60 | Temp 98.0°F | Resp 18 | Wt 141.0 lb

## 2021-08-11 DIAGNOSIS — D72820 Lymphocytosis (symptomatic): Secondary | ICD-10-CM | POA: Diagnosis not present

## 2021-08-11 DIAGNOSIS — Z85820 Personal history of malignant melanoma of skin: Secondary | ICD-10-CM | POA: Insufficient documentation

## 2021-08-11 DIAGNOSIS — Z803 Family history of malignant neoplasm of breast: Secondary | ICD-10-CM | POA: Diagnosis not present

## 2021-08-11 DIAGNOSIS — E538 Deficiency of other specified B group vitamins: Secondary | ICD-10-CM | POA: Diagnosis not present

## 2021-08-11 DIAGNOSIS — M810 Age-related osteoporosis without current pathological fracture: Secondary | ICD-10-CM | POA: Insufficient documentation

## 2021-08-11 DIAGNOSIS — I129 Hypertensive chronic kidney disease with stage 1 through stage 4 chronic kidney disease, or unspecified chronic kidney disease: Secondary | ICD-10-CM | POA: Insufficient documentation

## 2021-08-11 DIAGNOSIS — Z79899 Other long term (current) drug therapy: Secondary | ICD-10-CM | POA: Diagnosis not present

## 2021-08-11 DIAGNOSIS — D72829 Elevated white blood cell count, unspecified: Secondary | ICD-10-CM | POA: Insufficient documentation

## 2021-08-11 DIAGNOSIS — Z87891 Personal history of nicotine dependence: Secondary | ICD-10-CM | POA: Insufficient documentation

## 2021-08-11 DIAGNOSIS — E039 Hypothyroidism, unspecified: Secondary | ICD-10-CM | POA: Insufficient documentation

## 2021-08-11 DIAGNOSIS — Z7982 Long term (current) use of aspirin: Secondary | ICD-10-CM | POA: Diagnosis not present

## 2021-08-11 DIAGNOSIS — K219 Gastro-esophageal reflux disease without esophagitis: Secondary | ICD-10-CM | POA: Insufficient documentation

## 2021-08-11 DIAGNOSIS — N183 Chronic kidney disease, stage 3 unspecified: Secondary | ICD-10-CM | POA: Diagnosis not present

## 2021-08-11 DIAGNOSIS — Z800 Family history of malignant neoplasm of digestive organs: Secondary | ICD-10-CM | POA: Insufficient documentation

## 2021-08-11 LAB — COMPREHENSIVE METABOLIC PANEL
ALT: 15 U/L (ref 0–44)
AST: 24 U/L (ref 15–41)
Albumin: 3.8 g/dL (ref 3.5–5.0)
Alkaline Phosphatase: 46 U/L (ref 38–126)
Anion gap: 6 (ref 5–15)
BUN: 37 mg/dL — ABNORMAL HIGH (ref 8–23)
CO2: 29 mmol/L (ref 22–32)
Calcium: 9.6 mg/dL (ref 8.9–10.3)
Chloride: 103 mmol/L (ref 98–111)
Creatinine, Ser: 1.21 mg/dL — ABNORMAL HIGH (ref 0.44–1.00)
GFR, Estimated: 43 mL/min — ABNORMAL LOW (ref >60)
Glucose, Bld: 87 mg/dL (ref 70–99)
Potassium: 4.9 mmol/L (ref 3.5–5.1)
Sodium: 138 mmol/L (ref 135–145)
Total Bilirubin: 0.9 mg/dL (ref 0.3–1.2)
Total Protein: 6.5 g/dL (ref 6.5–8.1)

## 2021-08-11 LAB — CBC WITH DIFFERENTIAL/PLATELET
Abs Immature Granulocytes: 0.04 10*3/uL (ref 0.00–0.07)
Basophils Absolute: 0.1 10*3/uL (ref 0.0–0.1)
Basophils Relative: 1 %
Eosinophils Absolute: 0.6 10*3/uL — ABNORMAL HIGH (ref 0.0–0.5)
Eosinophils Relative: 6 %
HCT: 37.1 % (ref 36.0–46.0)
Hemoglobin: 12.4 g/dL (ref 12.0–15.0)
Immature Granulocytes: 0 %
Lymphocytes Relative: 29 %
Lymphs Abs: 2.7 10*3/uL (ref 0.7–4.0)
MCH: 30.1 pg (ref 26.0–34.0)
MCHC: 33.4 g/dL (ref 30.0–36.0)
MCV: 90 fL (ref 80.0–100.0)
Monocytes Absolute: 0.9 10*3/uL (ref 0.1–1.0)
Monocytes Relative: 10 %
Neutro Abs: 5 10*3/uL (ref 1.7–7.7)
Neutrophils Relative %: 54 %
Platelets: 261 10*3/uL (ref 150–400)
RBC: 4.12 MIL/uL (ref 3.87–5.11)
RDW: 14.5 % (ref 11.5–15.5)
WBC: 9.3 10*3/uL (ref 4.0–10.5)
nRBC: 0 % (ref 0.0–0.2)

## 2021-08-11 LAB — LACTATE DEHYDROGENASE: LDH: 134 U/L (ref 98–192)

## 2021-08-11 NOTE — Progress Notes (Signed)
Patient reports not sleeping well and overactive bladder

## 2021-08-11 NOTE — Progress Notes (Signed)
Hematology/Oncology Consult Note [facility] [phone] [phone]   Patient Care Team: Baxter Hire, MD as PCP - General (Internal Medicine)  REFERRING PROVIDER: Baxter Hire, MD   CHIEF COMPLAINTS/REASON FOR VISIT:  Evaluation of leukocytosis  HISTORY OF PRESENTING ILLNESS:  Olivia Lewis is a  87 y.o. female with PMH listed below who was referred for evaluation of leukocytosis Reviewed patient' recent labs obtained by PCP.   09/12/2020 CBC showed elevated white count of 19.8.  Hemoglobin 13.7, smear showed smudge cells.  Predominantly neutrophilia, lymphocytosis, monocytosis Previous lab records reviewed.  05/14/2020, WBC 11.5.  Predominantly lymphocytosis, basophilia, eosinophilia 01/02/2020, WBC 11.6 predominantly basophilia 11/13/2019, WBC 9.9 04/26/2019, WBC 10.1 10/27/19 19 WBC 11.6 predominantly basophilia No aggravating or elevated factors. Associated symptoms or signs:  Denies weight loss, fever, chills,night sweats.  Fatigue Smoking history: Former smoker History of recent oral steroid use or steroid injection: Patient reports recent 2 courses of steroids which she finished shortly prior to 09/12/2020 blood work. History of recent infection: Denies Autoimmune disease history.  Denies  Patient walks independently with a cane.  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  No other close family members.  Her husband passed away few years ago.  History of hospitalization due to fall, a fib with RVR. Several falls after hospitalization. Hx of right sided rib fracture.    INTERVAL HISTORY Olivia Lewis is a 87 y.o. female who returns to clinic for labs and further evaluation of leukocytosis.  She continues to feel well particularly for her age and denies specific complaints.  No fevers or chills.  No frequent infections.  No unintentional weight loss..  Overall she feels  well and denies specific complaints.    Review of Systems  Constitutional:  Negative for appetite change, chills, fatigue and fever.  HENT:   Negative for hearing loss and voice change.   Eyes:  Negative for eye problems.  Respiratory:  Negative for chest tightness and cough.   Cardiovascular:  Negative for chest pain.  Gastrointestinal:  Negative for abdominal distention, abdominal pain and blood in stool.  Endocrine: Negative for hot flashes.  Genitourinary:  Negative for difficulty urinating and frequency.   Musculoskeletal:  Negative for arthralgias.  Skin:  Negative for itching and rash.  Neurological:  Negative for dizziness and extremity weakness.  Hematological:  Negative for adenopathy. Does not bruise/bleed easily.  Psychiatric/Behavioral:  Negative for confusion and depression.     MEDICAL HISTORY:  Past Medical History:  Diagnosis Date   Allergy    Anemia    Arthritis    B12 deficiency    Basal cell carcinoma    face, R arm, txted by Dr. Koleen Nimrod and [facility])    skin--face and arms   Cataracts, bilateral    Chickenpox    CKD (chronic kidney disease), stage III ([place])    Diverticulosis    GERD (gastroesophageal reflux disease)    Hyperlipidemia    Hypertension    Hypothyroidism    Osteoporosis    Squamous cell carcinoma of skin 05/01/2021   right distal tricep at elbow, left forearm near elbow, right dorsum hand, left mid lat forearm - all treated with EDC   Squamous cell carcinoma of skin 05/01/2021   right calf - [facility] 07/14/21    SURGICAL HISTORY: Past Surgical History:  Procedure Laterality Date   BUNIONECTOMY Bilateral    CARDIAC CATHETERIZATION  CATARACT EXTRACTION Bilateral    DILATION AND CURETTAGE OF UTERUS     EYE SURGERY Bilateral    cataract  surgery   JOINT REPLACEMENT Right    total knee replacement   meniscus tear Right    multiple skin cancer removal     NEUROMA SURGERY Bilateral    bilateral feet   rotator cuff surgery  Right    TOTAL KNEE ARTHROPLASTY Left 05/06/2015   Procedure: TOTAL KNEE ARTHROPLASTY;  Surgeon: Hessie Knows, MD;  Location: [facility] ORS;  Service: Orthopedics;  Laterality: Left;    SOCIAL HISTORY: Social History   Socioeconomic History   Marital status: Widowed    Spouse name: Not on file   Number of children: Not on file   Years of education: Not on file   Highest education level: Not on file  Occupational History   Not on file  Tobacco Use   Smoking status: Never   Smokeless tobacco: Never  Vaping Use   Vaping Use: Never used  Substance and Sexual Activity   Alcohol use: No   Drug use: No   Sexual activity: Not Currently  Other Topics Concern   Not on file  Social History Narrative   Not on file   Social Determinants of Health   Financial Resource Strain: Not on file  Food Insecurity: Not on file  Transportation Needs: Not on file  Physical Activity: Not on file  Stress: Not on file  Social Connections: Not on file  Intimate Partner Violence: Not on file    FAMILY HISTORY: Family History  Problem Relation Age of Onset   Hypertension Mother    Hypertension Father    Diabetes Sister    Breast cancer Sister    Colon cancer Sister    Bone cancer Brother     ALLERGIES:  is allergic to penicillin v potassium and sulfa antibiotics.  MEDICATIONS:  Current Outpatient Medications  Medication Sig Dispense Refill   acetaminophen (TYLENOL) 500 MG tablet Take 1,000 mg by mouth daily as needed.      aspirin EC 81 MG tablet Take 81 mg by mouth daily.      atorvastatin (LIPITOR) 10 MG tablet Take 1 tablet by mouth daily.     Calcium Carb-Cholecalciferol 500-200 MG-UNIT TABS Take 1 tablet by mouth daily.      Cyanocobalamin (B-12 SL) Place 2,500 mcg under the tongue daily.      levothyroxine (SYNTHROID, LEVOTHROID) 50 MCG tablet Take 50 mcg by mouth daily at 6 (six) AM.     metoprolol succinate (TOPROL-XL) 25 MG 24 hr tablet Take 3 tablets (75 mg total) by mouth daily.  30 tablet 1   mupirocin ointment (BACTROBAN) 2 % Apply 1 application topically daily. With dressing changes 22 g 0   docusate sodium (COLACE) 100 MG capsule Take 100 mg by mouth daily as needed.  (Patient not taking: Reported on 08/11/2021)     lidocaine (LIDODERM) 5 % Place 1 patch onto the skin every 12 (twelve) hours. Remove & Discard patch within 12 hours or as directed by MD (Patient not taking: No sig reported) 10 patch 0   loratadine (CLARITIN) 10 MG tablet Take 10 mg by mouth daily as needed for allergies.  (Patient not taking: Reported on 08/11/2021)     oxybutynin (DITROPAN-XL) 5 MG 24 hr tablet Take 5 mg by mouth daily. (Patient not taking: Reported on 08/11/2021)     traMADol (ULTRAM) 50 MG tablet Take 1 tablet (50 mg total) by mouth  every 6 (six) hours as needed. (Patient not taking: Reported on 08/11/2021) 12 tablet 0   No current facility-administered medications for this visit.    PHYSICAL EXAMINATION: ECOG PERFORMANCE STATUS: 1 - Symptomatic but completely ambulatory Vitals:   08/11/21 1328  BP: (!) 169/124  Pulse: 60  Resp: 18  Temp: 98 F (36.7 C)  SpO2: 100%   Filed Weights   08/11/21 1328  Weight: 141 lb (64 kg)    Physical Exam Constitutional:      General: She is not in acute distress.    Comments: Patient walks with a walker.  HENT:     Head: Normocephalic and atraumatic.  Eyes:     General: No scleral icterus. Cardiovascular:     Rate and Rhythm: Normal rate and regular rhythm.     Heart sounds: Normal heart sounds.  Pulmonary:     Effort: Pulmonary effort is normal. No respiratory distress.     Breath sounds: No wheezing.  Abdominal:     General: Bowel sounds are normal. There is no distension.     Palpations: Abdomen is soft.  Musculoskeletal:        General: No deformity. Normal range of motion.     Cervical back: Normal range of motion and neck supple.  Skin:    General: Skin is warm and dry.     Findings: No erythema or rash.  Neurological:      Mental Status: She is alert and oriented to person, place, and time. Mental status is at baseline.     Cranial Nerves: No cranial nerve deficit.     Coordination: Coordination normal.  Psychiatric:        Mood and Affect: Mood normal.    RADIOGRAPHIC STUDIES: I have personally reviewed the radiological images as listed and agreed with the findings in the report. No results found.  LABORATORY DATA:  I have reviewed the data as listed Lab Results  Component Value Date   WBC 9.3 08/11/2021   HGB 12.4 08/11/2021   HCT 37.1 08/11/2021   MCV 90.0 08/11/2021   PLT 261 08/11/2021   Recent Labs    10/12/20 0439 02/17/21 1254 04/08/21 1159 04/09/21 0527 08/11/21 1252  NA 135 139 142 140 138  K 3.8 4.3 4.2 3.6 4.9  CL 107 102 107 106 103  CO2 24 27 22 26 29   GLUCOSE 112* 98 112* 81 87  BUN 25* 32* 39* 32* 37*  CREATININE 1.03* 1.12* 1.37* 1.11* 1.21*  CALCIUM 8.6* 9.8 9.9 9.1 9.6  GFRNONAA 52* 47* 37* 47* 43*  PROT 5.3* 6.7  --   --  6.5  ALBUMIN 3.0* 3.8  --   --  3.8  AST 19 25  --   --  24  ALT 15 14  --   --  15  ALKPHOS 31* 59  --   --  46  BILITOT 0.8 0.7  --   --  0.9    Iron/TIBC/Ferritin/ %Sat No results found for: IRON, TIBC, FERRITIN, IRONPCTSAT      ASSESSMENT & PLAN:  No diagnosis found.  1.  Leukocytosis-negative HIV, hepatitis, normal LDH.  Peripheral blood flow cytometry showed a CD5, CD10, CD23 negative monoclonal B-cell population of nonspecific type.  1% of the leukocytes.  Less than 5000 per/ul.  A typical phenotype for CLL/SLL, mantle cell lymphoma, hairy cell lymphoma, or follicular cell lymphoma was not detected.  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  She remains asymptomatic and I recommend continued observation.  Follow-up in 6 months with labs (cbc, cmp, ldh), and see Dr. Tasia Catchings  All questions were answered. The patient knows to call the clinic with any  problems questions or concerns.  Beckey Rutter, DNP, AGNP-C [place] [facility] [phone] (clinic) 08/11/2021

## 2021-08-13 DIAGNOSIS — E785 Hyperlipidemia, unspecified: Secondary | ICD-10-CM | POA: Insufficient documentation

## 2021-08-13 DIAGNOSIS — M1990 Unspecified osteoarthritis, unspecified site: Secondary | ICD-10-CM | POA: Insufficient documentation

## 2021-08-18 ENCOUNTER — Ambulatory Visit (INDEPENDENT_AMBULATORY_CARE_PROVIDER_SITE_OTHER): Payer: Medicare Other | Admitting: Dermatology

## 2021-08-18 ENCOUNTER — Encounter: Payer: Self-pay | Admitting: Dermatology

## 2021-08-18 ENCOUNTER — Other Ambulatory Visit: Payer: Self-pay

## 2021-08-18 DIAGNOSIS — L570 Actinic keratosis: Secondary | ICD-10-CM | POA: Diagnosis not present

## 2021-08-18 DIAGNOSIS — L578 Other skin changes due to chronic exposure to nonionizing radiation: Secondary | ICD-10-CM | POA: Diagnosis not present

## 2021-08-18 DIAGNOSIS — L820 Inflamed seborrheic keratosis: Secondary | ICD-10-CM

## 2021-08-18 DIAGNOSIS — C44722 Squamous cell carcinoma of skin of right lower limb, including hip: Secondary | ICD-10-CM

## 2021-08-18 DIAGNOSIS — C4492 Squamous cell carcinoma of skin, unspecified: Secondary | ICD-10-CM

## 2021-08-18 NOTE — Patient Instructions (Signed)
If you have any questions or concerns for your doctor, please call our main line at [phone] and press option 4 to reach your doctor's medical assistant. If no one answers, please leave a voicemail as directed and we will return your call as soon as possible. Messages left after 4 pm will be answered the following business day.   You may also send [place] a message via [place]. We typically respond to MyChart messages within 1-2 business days.  For prescription refills, please ask your pharmacy to contact our office. Our fax number is [phone].  If you have an urgent issue when the clinic is closed that cannot wait until the next business day, you can page your doctor at the number below.    Please note that while we do our best to be available for urgent issues outside of office hours, we are not available 24/7.   If you have an urgent issue and are unable to reach [place], you may choose to seek medical care at your doctor's office, retail clinic, urgent care center, or emergency room.  If you have a medical emergency, please immediately call 911 or go to the emergency department.  Pager Numbers  - Dr. Nehemiah Massed: [phone]  - Dr. Laurence Ferrari: [phone]  - Dr. Nicole Kindred: [phone]  In the event of inclement weather, please call our main line at [phone] for an update on the status of any delays or closures.  Dermatology Medication Tips: Please keep the boxes that topical medications come in in order to help keep track of the instructions about where and how to use these. Pharmacies typically print the medication instructions only on the boxes and not directly on the medication tubes.   If your medication is too expensive, please contact our office at [phone] option 4 or send [place] a message through [place].   We are unable to tell what your co-pay for medications will be in advance as this is different depending on your insurance coverage. However, we may be able to find a substitute  medication at lower cost or fill out paperwork to get insurance to cover a needed medication.   If a prior authorization is required to get your medication covered by your insurance company, please allow [place] 1-2 business days to complete this process.  Drug prices often vary depending on where the prescription is filled and some pharmacies may offer cheaper prices.  The website www.goodrx.com contains coupons for medications through different pharmacies. The prices here do not account for what the cost may be with help from insurance (it may be cheaper with your insurance), but the website can give you the price if you did not use any insurance.  - You can print the associated coupon and take it with your prescription to the pharmacy.  - You may also stop by our office during regular business hours and pick up a GoodRx coupon card.  - If you need your prescription sent electronically to a different pharmacy, notify our office through [facility] or by phone at [phone] option 4. Electrodesiccation and Curettage ("Scrape and Burn") Wound Care Instructions  Leave the original bandage on for 24 hours if possible.  If the bandage becomes soaked or soiled before that time, it is OK to remove it and examine the wound.  A small amount of post-operative bleeding is normal.  If excessive bleeding occurs, remove the bandage, place gauze over the site and apply continuous pressure (no peeking) over the area for 30 minutes. If  this does not work, please call our clinic as soon as possible or page your doctor if it is after hours.   Once a day, cleanse the wound with soap and water. It is fine to shower. If a thick crust develops you may use a Q-tip dipped into dilute hydrogen peroxide (mix 1:1 with water) to dissolve it.  Hydrogen peroxide can slow the healing process, so use it only as needed.    After washing, apply petroleum jelly (Vaseline) or an antibiotic ointment if your doctor prescribed one  for you, followed by a bandage.    For best healing, the wound should be covered with a layer of ointment at all times. If you are not able to keep the area covered with a bandage to hold the ointment in place, this may mean re-applying the ointment several times a day.  Continue this wound care until the wound has healed and is no longer open. It may take several weeks for the wound to heal and close.  Itching and mild discomfort is normal during the healing process.  If you have any discomfort, you can take Tylenol (acetaminophen) or ibuprofen as directed on the bottle. (Please do not take these if you have an allergy to them or cannot take them for another reason).  Some redness, tenderness and white or yellow material in the wound is normal healing.  If the area becomes very sore and red, or develops a thick yellow-green material (pus), it may be infected; please notify us.    Wound healing continues for up to one year following surgery. It is not unusual to experience pain in the scar from time to time during the interval.  If the pain becomes severe or the scar thickens, you should notify the office.    A slight amount of redness in a scar is expected for the first six months.  After six months, the redness will fade and the scar will soften and fade.  The color difference becomes less noticeable with time.  If there are any problems, return for a post-op surgery check at your earliest convenience.  To improve the appearance of the scar, you can use silicone scar gel, cream, or sheets (such as Mederma or Serica) every night for up to one year. These are available over the counter (without a prescription).  Please call our office at [phone] for any questions or concerns.

## 2021-08-18 NOTE — Progress Notes (Signed)
Follow-Up Visit   Subjective  Olivia Lewis is a 87 y.o. female who presents for the following: bx proven SCC (Of the R sup calf - patient is here today for [facility] ). She has also noticed scaly pink patches on her face and she would like to discuss treatment options.   The following portions of the chart were reviewed this encounter and updated as appropriate:   Tobacco  Allergies  Meds  Problems  Med Hx  Surg Hx  Fam Hx     Review of Systems:  No other skin or systemic complaints except as noted in HPI or Assessment and Plan.  Objective  Well appearing patient in no apparent distress; mood and affect are within normal limits.  A focused examination was performed including the right lower leg. Relevant physical exam findings are noted in the Assessment and Plan.  R sup calf Healing biopsy site.  Face x 16 (16) Erythematous thin papules/macules with gritty scale.   Face x 2 (2) Erythematous keratotic or waxy stuck-on papule or plaque.    Assessment & Plan  Squamous cell carcinoma of skin R sup calf  Destruction of lesion Complexity: extensive   Destruction method: electrodesiccation and curettage   Informed consent: discussed and consent obtained   Timeout:  patient name, date of birth, surgical site, and procedure verified Procedure prep:  Patient was prepped and draped in usual sterile fashion Prep type:  Isopropyl alcohol Anesthesia: the lesion was anesthetized in a standard fashion   Anesthetic:  1% lidocaine w/ epinephrine 1-100,000 buffered w/ 8.4% NaHCO3 Curettage performed in three different directions: Yes   Electrodesiccation performed over the curetted area: Yes   Lesion length (cm):  1.8 Lesion width (cm):  1.8 Margin per side (cm):  0.2 Final wound size (cm):  2.2 Hemostasis achieved with:  pressure, aluminum chloride and electrodesiccation Outcome: patient tolerated procedure well with no complications   Post-procedure details: sterile dressing  applied and wound care instructions given   Dressing type: bandage and petrolatum    Bx proven WELL DIFFERENTIATED SQUAMOUS CELL CARCINOMA WITH SUPERFICIAL INFILTRATION, PERIPHERAL MARGIN INVOLVED   AK (actinic keratosis) (16) Face x 16  Destruction of lesion - Face x 16 Complexity: simple   Destruction method: cryotherapy   Informed consent: discussed and consent obtained   Timeout:  patient name, date of birth, surgical site, and procedure verified Lesion destroyed using liquid nitrogen: Yes   Region frozen until ice ball extended beyond lesion: Yes   Outcome: patient tolerated procedure well with no complications   Post-procedure details: wound care instructions given    Inflamed seborrheic keratosis Face x 2  Destruction of lesion - Face x 2 Complexity: simple   Destruction method: cryotherapy   Informed consent: discussed and consent obtained   Timeout:  patient name, date of birth, surgical site, and procedure verified Lesion destroyed using liquid nitrogen: Yes   Region frozen until ice ball extended beyond lesion: Yes   Outcome: patient tolerated procedure well with no complications   Post-procedure details: wound care instructions given    Actinic Damage - chronic, secondary to cumulative UV radiation exposure/sun exposure over time - diffuse scaly erythematous macules with underlying dyspigmentation - Recommend daily broad spectrum sunscreen SPF 30+ to sun-exposed areas, reapply every 2 hours as needed.  - Recommend staying in the shade or wearing long sleeves, sun glasses (UVA+UVB protection) and wide brim hats (4-inch brim around the entire circumference of the hat). - Call for new or  changing lesions.  Return in about 4 months (around 12/18/2021).  Luther Redo, CMA, am acting as scribe for Sarina Ser, MD . Documentation: I have reviewed the above documentation for accuracy and completeness, and I agree with the above.  Sarina Ser, MD

## 2021-08-20 ENCOUNTER — Encounter: Payer: Self-pay | Admitting: Dermatology

## 2021-09-01 ENCOUNTER — Ambulatory Visit (INDEPENDENT_AMBULATORY_CARE_PROVIDER_SITE_OTHER): Payer: Medicare Other | Admitting: Podiatry

## 2021-09-01 ENCOUNTER — Other Ambulatory Visit: Payer: Self-pay

## 2021-09-01 DIAGNOSIS — M79675 Pain in left toe(s): Secondary | ICD-10-CM | POA: Diagnosis not present

## 2021-09-01 DIAGNOSIS — B351 Tinea unguium: Secondary | ICD-10-CM | POA: Diagnosis not present

## 2021-09-01 DIAGNOSIS — M79674 Pain in right toe(s): Secondary | ICD-10-CM

## 2021-09-01 NOTE — Progress Notes (Signed)
   SUBJECTIVE Patient presents to office today complaining of elongated, thickened nails that cause pain while ambulating in shoes.  Patient is unable to trim their own nails. Patient is here for further evaluation and treatment.  Past Medical History:  Diagnosis Date   Allergy    Anemia    Arthritis    B12 deficiency    Basal cell carcinoma    face, R arm, txted by Dr. Koleen Nimrod and [facility]   Cancer [facility])    skin--face and arms   Cataracts, bilateral    Chickenpox    CKD (chronic kidney disease), stage III ([place])    Diverticulosis    GERD (gastroesophageal reflux disease)    Hyperlipidemia    Hypertension    Hypothyroidism    Osteoporosis    Squamous cell carcinoma of skin 05/01/2021   right distal tricep at elbow, left forearm near elbow, right dorsum hand, left mid lat forearm - all treated with EDC   Squamous cell carcinoma of skin 05/01/2021   right calf - EDC 07/14/21   Squamous cell carcinoma of skin 08/18/2021   R sup calf - ED&C    OBJECTIVE General Patient is awake, alert, and oriented x 3 and in no acute distress. Derm Skin is dry and supple bilateral. Negative open lesions or macerations. Remaining integument unremarkable. Nails are tender, long, thickened and dystrophic with subungual debris, consistent with onychomycosis, 1-5 bilateral. No signs of infection noted. Vasc  DP and PT pedal pulses palpable bilaterally. Temperature gradient within normal limits.  Neuro Epicritic and protective threshold sensation grossly intact bilaterally.  Musculoskeletal Exam No symptomatic pedal deformities noted bilateral. Muscular strength within normal limits.  There is a crossover deformity of the left second toe  ASSESSMENT 1.  Pain due to onychomycosis of toenails both  PLAN OF CARE 1. Patient evaluated today.  2. Instructed to maintain good pedal hygiene and foot care.  3. Mechanical debridement of nails 1-5 bilaterally performed using a nail nipper. Filed with dremel without  incident.  4. Return to clinic in 3 mos.    Edrick Kins, DPM [facility]  Dr. Edrick Kins, DPM    [address],  [address]                Office [phone]  Fax [phone]

## 2021-10-21 ENCOUNTER — Other Ambulatory Visit: Payer: Self-pay | Admitting: Neurosurgery

## 2021-10-21 DIAGNOSIS — M4802 Spinal stenosis, cervical region: Secondary | ICD-10-CM

## 2021-10-21 DIAGNOSIS — M40202 Unspecified kyphosis, cervical region: Secondary | ICD-10-CM

## 2021-10-21 DIAGNOSIS — M542 Cervicalgia: Secondary | ICD-10-CM

## 2021-11-06 ENCOUNTER — Ambulatory Visit
Admission: RE | Admit: 2021-11-06 | Discharge: 2021-11-06 | Disposition: A | Discharge status: Home / Self Care | Payer: Medicare Other | Source: Ambulatory Visit | Attending: Neurosurgery | Admitting: Neurosurgery

## 2021-11-06 DIAGNOSIS — M542 Cervicalgia: Secondary | ICD-10-CM | POA: Insufficient documentation

## 2021-11-06 DIAGNOSIS — M4802 Spinal stenosis, cervical region: Secondary | ICD-10-CM | POA: Diagnosis present

## 2021-11-06 DIAGNOSIS — M40202 Unspecified kyphosis, cervical region: Secondary | ICD-10-CM | POA: Insufficient documentation

## 2021-12-10 ENCOUNTER — Other Ambulatory Visit: Payer: Self-pay

## 2021-12-10 ENCOUNTER — Ambulatory Visit (INDEPENDENT_AMBULATORY_CARE_PROVIDER_SITE_OTHER): Payer: Medicare Other | Admitting: Podiatry

## 2021-12-10 ENCOUNTER — Encounter: Payer: Self-pay | Admitting: Podiatry

## 2021-12-10 DIAGNOSIS — N1832 Chronic kidney disease, stage 3b: Secondary | ICD-10-CM | POA: Diagnosis not present

## 2021-12-10 DIAGNOSIS — M79675 Pain in left toe(s): Secondary | ICD-10-CM | POA: Diagnosis not present

## 2021-12-10 DIAGNOSIS — M79674 Pain in right toe(s): Secondary | ICD-10-CM

## 2021-12-10 DIAGNOSIS — B351 Tinea unguium: Secondary | ICD-10-CM | POA: Diagnosis not present

## 2021-12-10 NOTE — Progress Notes (Signed)
This patient returns to my office for at risk foot care.  This patient requires this care by a professional since this patient will be at risk due to having  CKD.  This patient is unable to cut nails herself since the patient cannot reach her nails.These nails are painful walking and wearing shoes.  This patient presents for at risk foot care today.  General Appearance  Alert, conversant and in no acute stress.  Vascular  Dorsalis pedis and posterior tibial  pulses are  weakly palpable  bilaterally.  Capillary return is within normal limits  bilaterally. Temperature is within normal limits  bilaterally.  Neurologic  Senn-Weinstein monofilament wire test within normal limits  bilaterally. Muscle power within normal limits bilaterally.  Nails Thick disfigured discolored nails with subungual debris  from hallux to fifth toes bilaterally. No evidence of bacterial infection or drainage bilaterally.  Orthopedic  No limitations of motion  feet .  No crepitus or effusions noted.  No bony pathology or digital deformities noted.  Overlapping second toe left foot.  HAV  B/L.  Skin  normotropic skin with no porokeratosis noted bilaterally.  No signs of infections or ulcers noted.   Corn dorsum left hallux.  Onychomycosis  Pain in right toes  Pain in left toes  Corn right hallux  Consent was obtained for treatment procedures.   Mechanical debridement of nails 1-5  bilaterally performed with a nail nipper.  Filed with dremel without incident.  Padding dispensed for her corn left hallux.   Return office visit   3 months                  Told patient to return for periodic foot care and evaluation due to potential at risk complications.   Gardiner Barefoot DPM

## 2021-12-29 ENCOUNTER — Ambulatory Visit (INDEPENDENT_AMBULATORY_CARE_PROVIDER_SITE_OTHER): Payer: Medicare Other | Admitting: Dermatology

## 2021-12-29 ENCOUNTER — Other Ambulatory Visit: Payer: Self-pay

## 2021-12-29 DIAGNOSIS — D0462 Carcinoma in situ of skin of left upper limb, including shoulder: Secondary | ICD-10-CM | POA: Diagnosis not present

## 2021-12-29 DIAGNOSIS — L570 Actinic keratosis: Secondary | ICD-10-CM

## 2021-12-29 DIAGNOSIS — D0430 Carcinoma in situ of skin of unspecified part of face: Secondary | ICD-10-CM

## 2021-12-29 DIAGNOSIS — D0439 Carcinoma in situ of skin of other parts of face: Secondary | ICD-10-CM

## 2021-12-29 DIAGNOSIS — L578 Other skin changes due to chronic exposure to nonionizing radiation: Secondary | ICD-10-CM

## 2021-12-29 DIAGNOSIS — D485 Neoplasm of uncertain behavior of skin: Secondary | ICD-10-CM

## 2021-12-29 NOTE — Progress Notes (Signed)
Follow-Up Visit   Subjective  Olivia Lewis is a 88 y.o. female who presents for the following: Actinic Keratosis (Of the face - check for new or persistent skin lesions today) and Irregular skin lesions (On the L jaw and R upper arm - irregular, new, will not resolve, patient is concerned and would like them checked today if possible.). The patient has spots, moles and lesions to be evaluated, some may be new or changing and the patient has concerns that these could be cancer.  The following portions of the chart were reviewed this encounter and updated as appropriate:   Tobacco   Allergies   Meds   Problems   Med Hx   Surg Hx   Fam Hx      Review of Systems:  No other skin or systemic complaints except as noted in HPI or Assessment and Plan.  Objective  Well appearing patient in no apparent distress; mood and affect are within normal limits.  A focused examination was performed including the face, arms, and hands. Relevant physical exam findings are noted in the Assessment and Plan.  L mandible 1.3 cm hyperkeratotic papule.  L lat elbow 0.8 cm hyperkeratotic papule.  L lat tricep 0.9 cm hyperkeratotic papule.   Assessment & Plan  Neoplasm of uncertain behavior of skin (3) L mandible  Epidermal / dermal shaving  Lesion diameter (cm):  1.3 Informed consent: discussed and consent obtained   Timeout: patient name, date of birth, surgical site, and procedure verified   Procedure prep:  Patient was prepped and draped in usual sterile fashion Prep type:  Isopropyl alcohol Anesthesia: the lesion was anesthetized in a standard fashion   Anesthetic:  1% lidocaine w/ epinephrine 1-100,000 buffered w/ 8.4% NaHCO3 Instrument used: flexible razor blade   Hemostasis achieved with: pressure, aluminum chloride and electrodesiccation   Outcome: patient tolerated procedure well   Post-procedure details: sterile dressing applied and wound care instructions given   Dressing type:  bandage and petrolatum    Destruction of lesion Complexity: extensive   Destruction method: electrodesiccation and curettage   Informed consent: discussed and consent obtained   Timeout:  patient name, date of birth, surgical site, and procedure verified Procedure prep:  Patient was prepped and draped in usual sterile fashion Prep type:  Isopropyl alcohol Anesthesia: the lesion was anesthetized in a standard fashion   Anesthetic:  1% lidocaine w/ epinephrine 1-100,000 buffered w/ 8.4% NaHCO3 Curettage performed in three different directions: Yes   Electrodesiccation performed over the curetted area: Yes   Lesion length (cm):  1.3 Lesion width (cm):  1.3 Margin per side (cm):  0.2 Final wound size (cm):  1.7 Hemostasis achieved with:  pressure, aluminum chloride and electrodesiccation Outcome: patient tolerated procedure well with no complications   Post-procedure details: sterile dressing applied and wound care instructions given   Dressing type: bandage and petrolatum    Specimen 1 - Surgical pathology Differential Diagnosis: D48.5 r/o SCC  ED&C today  Check Margins: No  L lat elbow  Epidermal / dermal shaving  Lesion diameter (cm):  0.8 Informed consent: discussed and consent obtained   Timeout: patient name, date of birth, surgical site, and procedure verified   Procedure prep:  Patient was prepped and draped in usual sterile fashion Prep type:  Isopropyl alcohol Anesthesia: the lesion was anesthetized in a standard fashion   Anesthetic:  1% lidocaine w/ epinephrine 1-100,000 buffered w/ 8.4% NaHCO3 Instrument used: flexible razor blade   Hemostasis achieved with:  pressure, aluminum chloride and electrodesiccation   Outcome: patient tolerated procedure well   Post-procedure details: sterile dressing applied and wound care instructions given   Dressing type: bandage and petrolatum    Destruction of lesion Complexity: extensive   Destruction method: electrodesiccation  and curettage   Informed consent: discussed and consent obtained   Timeout:  patient name, date of birth, surgical site, and procedure verified Procedure prep:  Patient was prepped and draped in usual sterile fashion Prep type:  Isopropyl alcohol Anesthesia: the lesion was anesthetized in a standard fashion   Anesthetic:  1% lidocaine w/ epinephrine 1-100,000 buffered w/ 8.4% NaHCO3 Curettage performed in three different directions: Yes   Electrodesiccation performed over the curetted area: Yes   Lesion length (cm):  0.8 Lesion width (cm):  0.8 Margin per side (cm):  0.2 Final wound size (cm):  1.2 Hemostasis achieved with:  pressure, aluminum chloride and electrodesiccation Outcome: patient tolerated procedure well with no complications   Post-procedure details: sterile dressing applied and wound care instructions given   Dressing type: bandage and petrolatum    Specimen 2 - Surgical pathology Differential Diagnosis: D48.5 r/o SCC ED&C today  Check Margins: No  L lat tricep  Epidermal / dermal shaving  Lesion diameter (cm):  0.9 Informed consent: discussed and consent obtained   Timeout: patient name, date of birth, surgical site, and procedure verified   Procedure prep:  Patient was prepped and draped in usual sterile fashion Prep type:  Isopropyl alcohol Anesthesia: the lesion was anesthetized in a standard fashion   Anesthetic:  1% lidocaine w/ epinephrine 1-100,000 buffered w/ 8.4% NaHCO3 Instrument used: flexible razor blade   Hemostasis achieved with: pressure, aluminum chloride and electrodesiccation   Outcome: patient tolerated procedure well   Post-procedure details: sterile dressing applied and wound care instructions given   Dressing type: bandage and petrolatum    Destruction of lesion Complexity: extensive   Destruction method: electrodesiccation and curettage   Informed consent: discussed and consent obtained   Timeout:  patient name, date of birth, surgical  site, and procedure verified Procedure prep:  Patient was prepped and draped in usual sterile fashion Prep type:  Isopropyl alcohol Anesthesia: the lesion was anesthetized in a standard fashion   Anesthetic:  1% lidocaine w/ epinephrine 1-100,000 buffered w/ 8.4% NaHCO3 Curettage performed in three different directions: Yes   Electrodesiccation performed over the curetted area: Yes   Lesion length (cm):  0.9 Lesion width (cm):  0.9 Margin per side (cm):  0.2 Final wound size (cm):  1.3 Hemostasis achieved with:  pressure, aluminum chloride and electrodesiccation Outcome: patient tolerated procedure well with no complications   Post-procedure details: sterile dressing applied and wound care instructions given   Dressing type: bandage and petrolatum    Specimen 3 - Surgical pathology Differential Diagnosis: D48.5 r/o SCC ED&C today  Check Margins: No  Patient has numerous suspicious skin lesions scattered on the arms, face, and neck. Plan to biopsy more NUB's at follow up appointment.  Actinic Damage - chronic, secondary to cumulative UV radiation exposure/sun exposure over time - diffuse scaly erythematous macules with underlying dyspigmentation - Recommend daily broad spectrum sunscreen SPF 30+ to sun-exposed areas, reapply every 2 hours as needed.  - Recommend staying in the shade or wearing long sleeves, sun glasses (UVA+UVB protection) and wide brim hats (4-inch brim around the entire circumference of the hat). - Call for new or changing lesions.  Actinic Keratoses Treat next visit  Return for 6-8 weeks to  treat other suspicious areas.  Luther Redo, CMA, am acting as scribe for Sarina Ser, MD . Documentation: I have reviewed the above documentation for accuracy and completeness, and I agree with the above.  Sarina Ser, MD

## 2021-12-29 NOTE — Patient Instructions (Signed)
Electrodesiccation and Curettage (Scrape and Burn) Wound Care Instructions  Leave the original bandage on for 24 hours if possible.  If the bandage becomes soaked or soiled before that time, it is OK to remove it and examine the wound.  A small amount of post-operative bleeding is normal.  If excessive bleeding occurs, remove the bandage, place gauze over the site and apply continuous pressure (no peeking) over the area for 30 minutes. If this does not work, please call our clinic as soon as possible or page your doctor if it is after hours.   Once a day, cleanse the wound with soap and water. It is fine to shower. If a thick crust develops you may use a Q-tip dipped into dilute hydrogen peroxide (mix 1:1 with water) to dissolve it.  Hydrogen peroxide can slow the healing process, so use it only as needed.    After washing, apply petroleum jelly (Vaseline) or an antibiotic ointment if your doctor prescribed one for you, followed by a bandage.    For best healing, the wound should be covered with a layer of ointment at all times. If you are not able to keep the area covered with a bandage to hold the ointment in place, this may mean re-applying the ointment several times a day.  Continue this wound care until the wound has healed and is no longer open. It may take several weeks for the wound to heal and close.  Itching and mild discomfort is normal during the healing process.  If you have any discomfort, you can take Tylenol (acetaminophen) or ibuprofen as directed on the bottle. (Please do not take these if you have an allergy to them or cannot take them for another reason).  Some redness, tenderness and white or yellow material in the wound is normal healing.  If the area becomes very sore and red, or develops a thick yellow-green material (pus), it may be infected; please notify us.    Wound healing continues for up to one year following surgery. It is not unusual to experience pain in the scar  from time to time during the interval.  If the pain becomes severe or the scar thickens, you should notify the office.    A slight amount of redness in a scar is expected for the first six months.  After six months, the redness will fade and the scar will soften and fade.  The color difference becomes less noticeable with time.  If there are any problems, return for a post-op surgery check at your earliest convenience.  To improve the appearance of the scar, you can use silicone scar gel, cream, or sheets (such as Mederma or Serica) every night for up to one year. These are available over the counter (without a prescription).  Please call our office at [phone] for any questions or concerns.  If You Need Anything After Your Visit  If you have any questions or concerns for your doctor, please call our main line at [phone] and press option 4 to reach your doctor's medical assistant. If no one answers, please leave a voicemail as directed and we will return your call as soon as possible. Messages left after 4 pm will be answered the following business day.   You may also send [place] a message via [place]. We typically respond to MyChart messages within 1-2 business days.  For prescription refills, please ask your pharmacy to contact our office. Our fax number is [phone].  If you have an  urgent issue when the clinic is closed that cannot wait until the next business day, you can page your doctor at the number below.    Please note that while we do our best to be available for urgent issues outside of office hours, we are not available 24/7.   If you have an urgent issue and are unable to reach [place], you may choose to seek medical care at your doctor's office, retail clinic, urgent care center, or emergency room.  If you have a medical emergency, please immediately call 911 or go to the emergency department.  Pager Numbers  - Dr. Nehemiah Massed: [phone]  - Dr. Laurence Ferrari: [phone]  -  Dr. Nicole Kindred: [phone]  In the event of inclement weather, please call our main line at [phone] for an update on the status of any delays or closures.  Dermatology Medication Tips: Please keep the boxes that topical medications come in in order to help keep track of the instructions about where and how to use these. Pharmacies typically print the medication instructions only on the boxes and not directly on the medication tubes.   If your medication is too expensive, please contact our office at [phone] option 4 or send [place] a message through [place].   We are unable to tell what your co-pay for medications will be in advance as this is different depending on your insurance coverage. However, we may be able to find a substitute medication at lower cost or fill out paperwork to get insurance to cover a needed medication.   If a prior authorization is required to get your medication covered by your insurance company, please allow [place] 1-2 business days to complete this process.  Drug prices often vary depending on where the prescription is filled and some pharmacies may offer cheaper prices.  The website www.goodrx.com contains coupons for medications through different pharmacies. The prices here do not account for what the cost may be with help from insurance (it may be cheaper with your insurance), but the website can give you the price if you did not use any insurance.  - You can print the associated coupon and take it with your prescription to the pharmacy.  - You may also stop by our office during regular business hours and pick up a GoodRx coupon card.  - If you need your prescription sent electronically to a different pharmacy, notify our office through [facility] or by phone at [phone] option 4.     Si Usted Necesita Algo Despus de Su Visita  Tambin puede enviarnos un mensaje a travs de Pharmacist, community. Por lo general respondemos a los mensajes de MyChart en el  transcurso de 1 a 2 das hbiles.  Para renovar recetas, por favor pida a su farmacia que se ponga en contacto con nuestra oficina. Harland Dingwall de fax es [place] [phone].  Si tiene un asunto urgente cuando la clnica est cerrada y que no puede esperar hasta el siguiente da hbil, puede llamar/localizar a su doctor(a) al nmero que aparece a continuacin.   Por favor, tenga en cuenta que aunque hacemos todo lo posible para estar disponibles para asuntos urgentes fuera del horario de [place], no estamos disponibles las 24 horas del da, los 7 das de la [place].   Si tiene un problema urgente y no puede comunicarse con nosotros, puede optar por buscar atencin mdica  en el consultorio de su doctor(a), en una clnica privada, en un centro de atencin urgente o en una sala de emergencias.  Si tiene Engineering geologist, por favor llame inmediatamente al 911 o vaya a la sala de emergencias.  Nmeros de bper  - Dr. Nehemiah Massed: [phone]  - Dra. Moye: [phone]  - Dra. Nicole Kindred: [phone]  En caso de inclemencias del [place], por favor llame a Johnsie Kindred principal al [phone] para una actualizacin sobre el [place] de cualquier retraso o cierre.  Consejos para la medicacin en dermatologa: Por favor, guarde las cajas en las que vienen los medicamentos de uso tpico para ayudarle a seguir las instrucciones sobre dnde y cmo usarlos. Las farmacias generalmente imprimen las instrucciones del medicamento slo en las cajas y no directamente en los tubos del [place].   Si su medicamento es muy caro, por favor, pngase en contacto con Zigmund Daniel llamando al [phone] y presione la opcin 4 o envenos un mensaje a travs de Pharmacist, community.   No podemos decirle cul ser su copago por los medicamentos por adelantado ya que esto es diferente dependiendo de la cobertura de su seguro. Sin embargo, es posible que podamos encontrar un medicamento sustituto a Electrical engineer un  formulario para que el seguro cubra el medicamento que se considera necesario.   Si se requiere una autorizacin previa para que su compaa de seguros [place] su medicamento, por favor permtanos de 1 a 2 das hbiles para completar este proceso.  Los precios de los medicamentos varan con frecuencia dependiendo del Environmental consultant de dnde se surte la receta y alguna farmacias pueden ofrecer precios ms baratos.  El sitio web www.goodrx.com tiene cupones para medicamentos de Airline pilot. Los precios aqu no tienen en cuenta lo que podra costar con la ayuda del seguro (puede ser ms barato con su seguro), pero el sitio web puede darle el precio si no utiliz Research scientist (physical sciences).  - Puede imprimir el cupn correspondiente y llevarlo con su receta a la farmacia.  - Tambin puede pasar por nuestra oficina durante el horario de atencin regular y Charity fundraiser una tarjeta de cupones de GoodRx.  - Si necesita que su receta se enve electrnicamente a una farmacia diferente, informe a nuestra oficina a travs de MyChart de [place] o por telfono llamando al [phone] y presione la opcin 4.

## 2021-12-30 ENCOUNTER — Encounter: Payer: Self-pay | Admitting: Dermatology

## 2022-01-01 ENCOUNTER — Telehealth: Payer: Self-pay

## 2022-01-01 NOTE — Telephone Encounter (Signed)
Patient informed of pathology results 

## 2022-01-01 NOTE — Telephone Encounter (Signed)
-----   Message from Ralene Bathe, MD sent at 12/30/2021  5:39 PM EST ----- Diagnosis 1. Skin , left mandible SQUAMOUS CELL CARCINOMA IN SITU, HYPERTROPHIC, BASE INVOLVED 2. Skin , left lat elbow SQUAMOUS CELL CARCINOMA IN SITU, BASE INVOLVED 3. Skin , left lat tricep SQUAMOUS CELL CARCINOMA IN SITU ARISING IN A SEBORRHEIC KERATOSIS, CLOSE TO MARGIN  1,2,3 - all three showed cancer - SCC in situ All 3 already treated Recheck next visit

## 2022-01-26 ENCOUNTER — Other Ambulatory Visit: Payer: Self-pay | Admitting: Oncology

## 2022-01-26 DIAGNOSIS — D72820 Lymphocytosis (symptomatic): Secondary | ICD-10-CM

## 2022-02-09 ENCOUNTER — Ambulatory Visit: Payer: Medicare Other | Admitting: Oncology

## 2022-02-09 ENCOUNTER — Other Ambulatory Visit: Payer: Medicare Other

## 2022-02-16 ENCOUNTER — Ambulatory Visit: Payer: Medicare Other | Admitting: Dermatology

## 2022-03-01 ENCOUNTER — Other Ambulatory Visit: Payer: Self-pay

## 2022-03-01 DIAGNOSIS — D72820 Lymphocytosis (symptomatic): Secondary | ICD-10-CM

## 2022-03-02 ENCOUNTER — Encounter: Payer: Self-pay | Admitting: Oncology

## 2022-03-02 ENCOUNTER — Inpatient Hospital Stay: Payer: Medicare Other | Attending: Oncology | Admitting: Oncology

## 2022-03-02 ENCOUNTER — Inpatient Hospital Stay: Payer: Medicare Other

## 2022-03-02 VITALS — BP 158/100 | HR 70 | Temp 97.1°F | Resp 16 | Wt 145.0 lb

## 2022-03-02 DIAGNOSIS — K219 Gastro-esophageal reflux disease without esophagitis: Secondary | ICD-10-CM | POA: Insufficient documentation

## 2022-03-02 DIAGNOSIS — N183 Chronic kidney disease, stage 3 unspecified: Secondary | ICD-10-CM | POA: Diagnosis not present

## 2022-03-02 DIAGNOSIS — Z800 Family history of malignant neoplasm of digestive organs: Secondary | ICD-10-CM | POA: Insufficient documentation

## 2022-03-02 DIAGNOSIS — D649 Anemia, unspecified: Secondary | ICD-10-CM | POA: Diagnosis not present

## 2022-03-02 DIAGNOSIS — M2550 Pain in unspecified joint: Secondary | ICD-10-CM | POA: Insufficient documentation

## 2022-03-02 DIAGNOSIS — D72820 Lymphocytosis (symptomatic): Secondary | ICD-10-CM | POA: Insufficient documentation

## 2022-03-02 DIAGNOSIS — Z7982 Long term (current) use of aspirin: Secondary | ICD-10-CM | POA: Insufficient documentation

## 2022-03-02 DIAGNOSIS — E785 Hyperlipidemia, unspecified: Secondary | ICD-10-CM | POA: Insufficient documentation

## 2022-03-02 DIAGNOSIS — Z79899 Other long term (current) drug therapy: Secondary | ICD-10-CM | POA: Diagnosis not present

## 2022-03-02 DIAGNOSIS — E538 Deficiency of other specified B group vitamins: Secondary | ICD-10-CM | POA: Insufficient documentation

## 2022-03-02 DIAGNOSIS — E039 Hypothyroidism, unspecified: Secondary | ICD-10-CM | POA: Insufficient documentation

## 2022-03-02 DIAGNOSIS — Z85828 Personal history of other malignant neoplasm of skin: Secondary | ICD-10-CM | POA: Insufficient documentation

## 2022-03-02 DIAGNOSIS — Z803 Family history of malignant neoplasm of breast: Secondary | ICD-10-CM | POA: Diagnosis not present

## 2022-03-02 DIAGNOSIS — I129 Hypertensive chronic kidney disease with stage 1 through stage 4 chronic kidney disease, or unspecified chronic kidney disease: Secondary | ICD-10-CM | POA: Diagnosis not present

## 2022-03-02 LAB — COMPREHENSIVE METABOLIC PANEL
ALT: 18 U/L (ref 0–44)
AST: 27 U/L (ref 15–41)
Albumin: 3.9 g/dL (ref 3.5–5.0)
Alkaline Phosphatase: 52 U/L (ref 38–126)
Anion gap: 6 (ref 5–15)
BUN: 32 mg/dL — ABNORMAL HIGH (ref 8–23)
CO2: 28 mmol/L (ref 22–32)
Calcium: 9.4 mg/dL (ref 8.9–10.3)
Chloride: 105 mmol/L (ref 98–111)
Creatinine, Ser: 1.03 mg/dL — ABNORMAL HIGH (ref 0.44–1.00)
GFR, Estimated: 51 mL/min — ABNORMAL LOW (ref >60)
Glucose, Bld: 96 mg/dL (ref 70–99)
Potassium: 4.3 mmol/L (ref 3.5–5.1)
Sodium: 139 mmol/L (ref 135–145)
Total Bilirubin: 0.7 mg/dL (ref 0.3–1.2)
Total Protein: 6.5 g/dL (ref 6.5–8.1)

## 2022-03-02 LAB — CBC WITH DIFFERENTIAL/PLATELET
Abs Immature Granulocytes: 0.07 10*3/uL (ref 0.00–0.07)
Basophils Absolute: 0.1 10*3/uL (ref 0.0–0.1)
Basophils Relative: 1 %
Eosinophils Absolute: 0.6 10*3/uL — ABNORMAL HIGH (ref 0.0–0.5)
Eosinophils Relative: 5 %
HCT: 39.3 % (ref 36.0–46.0)
Hemoglobin: 13.3 g/dL (ref 12.0–15.0)
Immature Granulocytes: 1 %
Lymphocytes Relative: 26 %
Lymphs Abs: 2.9 10*3/uL (ref 0.7–4.0)
MCH: 30.4 pg (ref 26.0–34.0)
MCHC: 33.8 g/dL (ref 30.0–36.0)
MCV: 89.7 fL (ref 80.0–100.0)
Monocytes Absolute: 1.1 10*3/uL — ABNORMAL HIGH (ref 0.1–1.0)
Monocytes Relative: 10 %
Neutro Abs: 6.4 10*3/uL (ref 1.7–7.7)
Neutrophils Relative %: 57 %
Platelets: 277 10*3/uL (ref 150–400)
RBC: 4.38 MIL/uL (ref 3.87–5.11)
RDW: 14 % (ref 11.5–15.5)
WBC: 11.1 10*3/uL — ABNORMAL HIGH (ref 4.0–10.5)
nRBC: 0 % (ref 0.0–0.2)

## 2022-03-02 LAB — LACTATE DEHYDROGENASE: LDH: 165 U/L (ref 98–192)

## 2022-03-02 NOTE — Progress Notes (Signed)
?Hematology/Oncology Progress note ?[phone] [phone] ?  ? ? ? ?Patient Care Team: ?Baxter Hire, MD as PCP - General (Internal Medicine) ? ?REFERRING PROVIDER: ?Baxter Hire, MD  ?CHIEF COMPLAINTS/REASON FOR VISIT:  ?Monoclonal lymphocytosis follow-up ? ?HISTORY OF PRESENTING ILLNESS:  ?Olivia Lewis is a  88 y.o.  female with PMH listed below who was referred to me for evaluation of leukocytosis ?Reviewed patient' recent labs obtained by PCP.  ? ?09/12/2020 CBC showed elevated white count of 19.8.  Hemoglobin 13.7, smear showed smudge cells.  Predominantly neutrophilia, lymphocytosis, monocytosis ?Previous lab records reviewed.  ?05/14/2020, WBC 11.5.  Predominantly lymphocytosis, basophilia, eosinophilia ?01/02/2020, WBC 11.6 predominantly basophilia ?11/13/2019, WBC 9.9 ?04/26/2019, WBC 10.1 ?10/27/19 19 WBC 11.6 predominantly basophilia ?No aggravating or elevated factors. ?Associated symptoms or signs:  ?Denies weight loss, fever, chills,night sweats.  Fatigue ?Smoking history: Former smoker ?History of recent oral steroid use or steroid injection: Patient reports recent 2 courses of steroids which she finished shortly prior to 09/12/2020 blood work. ?History of recent infection: Denies ?Autoimmune disease history.  Denies ? ?Patient walks independently with a cane.  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  No other close family members.  Her husband passed away few years ago. ? ? ?INTERVAL HISTORY ?Olivia Lewis is a 88 y.o. female who has above history reviewed by me today presents for follow up visit for management of leukocytosis ?Patient reports that she has some stress in life recently.  She takes care of her demented sister who is 66.  Selling her house recently.  Denies any unintentional weight loss, night sweats, fever. + Arthralgia ? ? Review of Systems  ?Constitutional:  Negative for appetite change, chills,  fatigue and fever.  ?HENT:   Negative for hearing loss and voice change.   ?Eyes:  Negative for eye problems.  ?Respiratory:  Negative for chest tightness and cough.   ?Cardiovascular:  Negative for chest pain.  ?Gastrointestinal:  Negative for abdominal distention, abdominal pain and blood in stool.  ?Endocrine: Negative for hot flashes.  ?Genitourinary:  Negative for difficulty urinating and frequency.   ?Musculoskeletal:  Negative for arthralgias.  ?Skin:  Negative for itching and rash.  ?Neurological:   ?     Frequent falls  ?Hematological:  Negative for adenopathy.  ?Psychiatric/Behavioral:  Negative for confusion.   ? ? ?MEDICAL HISTORY:  ?Past Medical History:  ?Diagnosis Date  ? Allergy   ? Anemia   ? Arthritis   ? B12 deficiency   ? Basal cell carcinoma   ? face, R arm, txted by Dr. Koleen Nimrod and [facility]  ? Cancer [facility])   ? skin--face and arms  ? Cataracts, bilateral   ? Chickenpox   ? CKD (chronic kidney disease), stage III ([place])   ? Diverticulosis   ? GERD (gastroesophageal reflux disease)   ? Hyperlipidemia   ? Hypertension   ? Hypothyroidism   ? Osteoporosis   ? Squamous cell carcinoma of skin 05/01/2021  ? right distal tricep at elbow, left forearm near elbow, right dorsum hand, left mid lat forearm - all treated with EDC  ? Squamous cell carcinoma of skin 05/01/2021  ? right calf - EDC 07/14/21  ? Squamous cell carcinoma of skin 08/18/2021  ? R sup calf - ED&C  ? Squamous cell carcinoma of skin 12/29/2021  ? L mandible - ED&C  ? Squamous cell carcinoma of skin 12/29/2021  ? L lat  elbow - ED&C  ? Squamous cell carcinoma of skin 12/29/2021  ? L lat tricep - ED&C  ? ? ?SURGICAL HISTORY: ?Past Surgical History:  ?Procedure Laterality Date  ? BUNIONECTOMY Bilateral   ? CARDIAC CATHETERIZATION    ? CATARACT EXTRACTION Bilateral   ? DILATION AND CURETTAGE OF UTERUS    ? EYE SURGERY Bilateral   ? cataract  surgery  ? JOINT REPLACEMENT Right   ? total knee replacement  ? meniscus tear Right   ? multiple skin cancer  removal    ? NEUROMA SURGERY Bilateral   ? bilateral feet  ? rotator cuff surgery Right   ? TOTAL KNEE ARTHROPLASTY Left 05/06/2015  ? Procedure: TOTAL KNEE ARTHROPLASTY;  Surgeon: Hessie Knows, MD;  Location: [facility] ORS;  Service: Orthopedics;  Laterality: Left;  ? ? ?SOCIAL HISTORY: ?Social History  ? ?Socioeconomic History  ? Marital status: Widowed  ?  Spouse name: Not on file  ? Number of children: Not on file  ? Years of education: Not on file  ? Highest education level: Not on file  ?Occupational History  ? Not on file  ?Tobacco Use  ? Smoking status: Never  ? Smokeless tobacco: Never  ?Vaping Use  ? Vaping Use: Never used  ?Substance and Sexual Activity  ? Alcohol use: No  ? Drug use: No  ? Sexual activity: Not Currently  ?Other Topics Concern  ? Not on file  ?Social History Narrative  ? Not on file  ? ?Social Determinants of Health  ? ?Financial Resource Strain: Not on file  ?Food Insecurity: Not on file  ?Transportation Needs: Not on file  ?Physical Activity: Not on file  ?Stress: Not on file  ?Social Connections: Not on file  ?Intimate Partner Violence: Not on file  ? ? ?FAMILY HISTORY: ?Family History  ?Problem Relation Age of Onset  ? Hypertension Mother   ? Hypertension Father   ? Diabetes Sister   ? Breast cancer Sister   ? Colon cancer Sister   ? Bone cancer Brother   ? ? ?ALLERGIES:  is allergic to penicillin v potassium and sulfa antibiotics. ? ?MEDICATIONS:  ?Current Outpatient Medications  ?Medication Sig Dispense Refill  ? acetaminophen (TYLENOL) 500 MG tablet Take 1,000 mg by mouth daily as needed.     ? aspirin EC 81 MG tablet Take 81 mg by mouth daily.     ? atorvastatin (LIPITOR) 10 MG tablet Take 1 tablet by mouth daily.    ? Calcium Carb-Cholecalciferol 500-200 MG-UNIT TABS Take 1 tablet by mouth daily.     ? Cyanocobalamin (B-12 SL) Place 2,500 mcg under the tongue daily.     ? docusate sodium (COLACE) 100 MG capsule Take 100 mg by mouth daily as needed.    ? levothyroxine (SYNTHROID,  LEVOTHROID) 50 MCG tablet Take 50 mcg by mouth daily at 6 (six) AM.    ? loratadine (CLARITIN) 10 MG tablet Take 10 mg by mouth daily as needed for allergies.    ? metoprolol succinate (TOPROL-XL) 25 MG 24 hr tablet Take 3 tablets (75 mg total) by mouth daily. 30 tablet 1  ? traMADol (ULTRAM) 50 MG tablet Take 1 tablet (50 mg total) by mouth every 6 (six) hours as needed. 12 tablet 0  ? ?No current facility-administered medications for this visit.  ? ? ? ?PHYSICAL EXAMINATION: ?ECOG PERFORMANCE STATUS: 1 - Symptomatic but completely ambulatory ?Vitals:  ? 03/02/22 1313  ?BP: (!) 158/100  ?Pulse: 70  ?Resp: 16  ?  Temp: (!) 97.1 ?F (36.2 ?C)  ?SpO2: 100%  ? ?Filed Weights  ? 03/02/22 1313  ?Weight: 145 lb (65.8 kg)  ? ? ?Physical Exam ?Constitutional:   ?   General: She is not in acute distress. ?   Comments: Patient walks with a walker.  ?HENT:  ?   Head: Normocephalic and atraumatic.  ?Eyes:  ?   General: No scleral icterus. ?Cardiovascular:  ?   Rate and Rhythm: Normal rate and regular rhythm.  ?   Heart sounds: Normal heart sounds.  ?Pulmonary:  ?   Effort: Pulmonary effort is normal. No respiratory distress.  ?   Breath sounds: No wheezing.  ?Abdominal:  ?   General: Bowel sounds are normal. There is no distension.  ?   Palpations: Abdomen is soft.  ?Musculoskeletal:     ?   General: No deformity. Normal range of motion.  ?   Cervical back: Normal range of motion and neck supple.  ?Skin: ?   General: Skin is warm and dry.  ?   Findings: No erythema or rash.  ?Neurological:  ?   Mental Status: She is alert and oriented to person, place, and time. Mental status is at baseline.  ?   Cranial Nerves: No cranial nerve deficit.  ?   Coordination: Coordination normal.  ?Psychiatric:     ?   Mood and Affect: Mood normal.  ? ? ? ?  Latest Ref Rng & Units 03/02/2022  ? 12:30 PM  ?CMP  ?Glucose 70 - 99 mg/dL 96    ?BUN 8 - 23 mg/dL 32    ?Creatinine 0.44 - 1.00 mg/dL 1.03    ?Sodium 135 - 145 mmol/L 139    ?Potassium 3.5 - 5.1  mmol/L 4.3    ?Chloride 98 - 111 mmol/L 105    ?CO2 22 - 32 mmol/L 28    ?Calcium 8.9 - 10.3 mg/dL 9.4    ?Total Protein 6.5 - 8.1 g/dL 6.5    ?Total Bilirubin 0.3 - 1.2 mg/dL 0.7    ?Alkaline Phos 38 - 126

## 2022-03-02 NOTE — Progress Notes (Signed)
Pt in for follow up, states has pain in right shoulder.  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 ? ?

## 2022-03-04 LAB — COMP PANEL: LEUKEMIA/LYMPHOMA: Immunophenotypic Profile: 1

## 2022-04-14 ENCOUNTER — Emergency Department: Payer: Medicare Other

## 2022-04-14 ENCOUNTER — Emergency Department
Admission: EM | Admit: 2022-04-14 | Discharge: 2022-04-14 | Disposition: A | Discharge status: Home / Self Care | Payer: Medicare Other | Attending: Emergency Medicine | Admitting: Emergency Medicine

## 2022-04-14 ENCOUNTER — Encounter: Payer: Self-pay | Admitting: Medical Oncology

## 2022-04-14 DIAGNOSIS — Z23 Encounter for immunization: Secondary | ICD-10-CM | POA: Diagnosis not present

## 2022-04-14 DIAGNOSIS — S0990XA Unspecified injury of head, initial encounter: Secondary | ICD-10-CM | POA: Diagnosis present

## 2022-04-14 DIAGNOSIS — S61419A Laceration without foreign body of unspecified hand, initial encounter: Secondary | ICD-10-CM

## 2022-04-14 DIAGNOSIS — S0101XA Laceration without foreign body of scalp, initial encounter: Secondary | ICD-10-CM | POA: Insufficient documentation

## 2022-04-14 DIAGNOSIS — W19XXXA Unspecified fall, initial encounter: Secondary | ICD-10-CM

## 2022-04-14 DIAGNOSIS — W01198A Fall on same level from slipping, tripping and stumbling with subsequent striking against other object, initial encounter: Secondary | ICD-10-CM | POA: Diagnosis not present

## 2022-04-14 DIAGNOSIS — S61412A Laceration without foreign body of left hand, initial encounter: Secondary | ICD-10-CM | POA: Diagnosis not present

## 2022-04-14 MED ORDER — TETANUS-DIPHTH-ACELL PERTUSSIS 5-2.5-18.5 LF-MCG/0.5 IM SUSY
0.5000 mL | PREFILLED_SYRINGE | Freq: Once | INTRAMUSCULAR | Status: AC
  Administered 2022-04-14: 0.5 mL via INTRAMUSCULAR
  Filled 2022-04-14: qty 0.5

## 2022-04-14 NOTE — ED Provider Notes (Signed)
[facility] Provider Note    Event Date/Time   First MD Initiated Contact with Patient 04/14/22 1156     (approximate)   History   Fall   HPI  Olivia Lewis is a 88 y.o. female presents emergency department via EMS after a fall.  Patient states she takes care of her younger sister that has dementia and she went to help her and they both fell to the ground.  States she had to slide on her bottom to call EMS.  Did hit her head and has a laceration.  Also has skin tear on her left hand.  Denies any new hip or back pain.      Physical Exam   Triage Vital Signs: ED Triage Vitals  Enc Vitals Group     BP 04/14/22 1131 (!) 169/83     Pulse Rate 04/14/22 1131 60     Resp 04/14/22 1131 16     Temp 04/14/22 1131 97.6 F (36.4 C)     Temp Source 04/14/22 1131 Oral     SpO2 04/14/22 1131 96 %     Weight 04/14/22 1133 143 lb 4.8 oz (65 kg)     Height --      Head Circumference --      Peak Flow --      Pain Score 04/14/22 1132 5     Pain Loc --      Pain Edu? --      Excl. in [place]? --     Most recent vital signs: Vitals:   04/14/22 1131  BP: (!) 169/83  Pulse: 60  Resp: 16  Temp: 97.6 F (36.4 C)  SpO2: 96%     General: Awake, no distress.   CV:  Good peripheral perfusion. regular rate and  rhythm Resp:  Normal effort.  Abd:  No distention.   Other:  Large skin tear noted to the left hand, small laceration noted to the scalp, scalp is tender and bruised, C-spine is tender, neurovascular is intact   ED Results / Procedures / Treatments   Labs (all labs ordered are listed, but only abnormal results are displayed) Labs Reviewed - No data to display   EKG     RADIOLOGY CT of the head and C-spine, x-ray of the left wrist    PROCEDURES:   .Marland KitchenLaceration Repair  Date/Time: 04/14/2022 1:43 PM Performed by: Versie Starks, PA-C Authorized by: Versie Starks, PA-C   Consent:    Consent obtained:  Verbal   Consent given by:   Patient   Risks, benefits, and alternatives were discussed: yes     Risks discussed:  Infection, pain, retained foreign body, poor cosmetic result, need for additional repair, nerve damage, poor wound healing and vascular damage Universal protocol:    Procedure explained and questions answered to patient or proxy's satisfaction: yes     Immediately prior to procedure, a time out was called: yes     Patient identity confirmed:  Verbally with patient Anesthesia:    Anesthesia method:  None Laceration details:    Location:  Scalp   Scalp location:  Crown   Length (cm):  1 Exploration:    Hemostasis achieved with:  Direct pressure   Imaging outcome: foreign body not noted     Wound exploration: wound explored through full range of motion     Wound extent: no areolar tissue violation noted, no fascia violation noted, no foreign bodies/material noted, no muscle damage noted,  no nerve damage noted, no tendon damage noted, no underlying fracture noted and no vascular damage noted     Contaminated: no   Treatment:    Area cleansed with:  Saline   Amount of cleaning:  Standard   Irrigation solution:  Sterile saline   Irrigation method:  Tap Skin repair:    Repair method:  Staples   Number of staples:  1 Approximation:    Approximation:  Close Repair type:    Repair type:  Simple Post-procedure details:    Dressing:  Antibiotic ointment   Procedure completion:  Tolerated well, no immediate complications .Marland KitchenLaceration Repair  Date/Time: 04/14/2022 1:45 PM Performed by: Versie Starks, PA-C Authorized by: Versie Starks, PA-C   Consent:    Consent obtained:  Verbal   Consent given by:  Patient   Risks discussed:  Infection, pain, retained foreign body, tendon damage, poor cosmetic result, need for additional repair, nerve damage, poor wound healing and vascular damage   Alternatives discussed:  No treatment Universal protocol:    Procedure explained and questions answered to patient  or proxy's satisfaction: yes     Immediately prior to procedure, a time out was called: yes     Patient identity confirmed:  Verbally with patient Anesthesia:    Anesthesia method:  None Laceration details:    Location:  Hand   Hand location:  L hand, dorsum   Length (cm):  5 Exploration:    Hemostasis achieved with:  Direct pressure   Imaging obtained: x-ray     Imaging outcome: foreign body not noted     Wound exploration: wound explored through full range of motion     Wound extent: no areolar tissue violation noted, no fascia violation noted, no foreign bodies/material noted, no muscle damage noted, no nerve damage noted, no tendon damage noted, no underlying fracture noted and no vascular damage noted     Contaminated: no   Treatment:    Area cleansed with:  Saline   Amount of cleaning:  Standard   Irrigation solution:  Sterile saline   Irrigation method:  Tap Skin repair:    Repair method:  Steri-Strips and tissue adhesive   Number of Steri-Strips:  3 Approximation:    Approximation:  Close Repair type:    Repair type:  Simple Post-procedure details:    Dressing:  Open (no dressing)   Procedure completion:  Tolerated well, no immediate complications   MEDICATIONS ORDERED IN ED: Medications  Tdap (BOOSTRIX) injection 0.5 mL (0.5 mLs Intramuscular Given 04/14/22 1258)     IMPRESSION / MDM / [place] / ED COURSE  I reviewed the triage vital signs and the nursing notes.                              Differential diagnosis includes, but is not limited to, laceration, skin tear, subdural, SAH, fracture of the C-spine and left wrist  CT of the head and C-spine were interpreted by me as being negative for any acute abnormality.  X-ray of the left wrist is interpreted by me as being negative, confirmed by radiology  See procedure note for laceration and skin tear repair  We will see if the patient can ambulate without difficulty prior to discharge.  She does  live at home with the sister so she is unable to ambulate we will have to admit either for social work SNF placement or will do lab work  to see if there is any abnormalities.  Patient is able to ambulate with a walker without difficulty.  Will discharge her with referral for social work to follow-up.  Patient's sister is here being seen so we will place her with her prior to calling EMS for return to home.       FINAL CLINICAL IMPRESSION(S) / ED DIAGNOSES   Final diagnoses:  Fall, initial encounter  Minor head injury, initial encounter  Laceration of scalp, initial encounter  Skin tear of hand without complication, initial encounter     Rx / DC Orders   ED Discharge Orders     None        Note:  This document was prepared using Dragon voice recognition software and may include unintentional dictation errors.    Versie Starks, PA-C 04/14/22 1359    Vanessa Marshall, MD 04/15/22 [phone]

## 2022-04-14 NOTE — ED Provider Triage Note (Signed)
Emergency Medicine Provider Triage Evaluation Note  Gelisa A Mikulski , a 88 y.o. female  was evaluated in triage.  Pt complains of fall with scalp laceration and skin tear to the left wrist. She and her sister fell while patient was trying to assist sister with ADL this morning. No loss of consciousness. Complaining of pain in head and wrist.  Review of Systems  Positive: Head injury, neck pain Negative: Loss of consciousness  Physical Exam  There were no vitals taken for this visit. Gen:   Awake, no distress   Resp:  Normal effort  MSK:   Moves extremities without difficulty  Other:  Small scalp laceration. Skin tear to left wrist/hand.  Medical Decision Making  Medically screening exam initiated at 11:27 AM.  Appropriate orders placed.  Deeya A Erman was informed that the remainder of the evaluation will be completed by another provider, this initial triage assessment does not replace that evaluation, and the importance of remaining in the ED until their evaluation is complete.    Victorino Dike, FNP 04/14/22 1133

## 2022-04-14 NOTE — ED Triage Notes (Signed)
Pt reports she takes care of her sister at home and today while helping her she tripped and fell. Lac to top of head noted and skin tear to top of left hand. Pt unsure of last tetanus. Pt denies LOC. A/O x 4.
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
## 2022-04-14 NOTE — ED Notes (Signed)
Ambulated pt in the hall. Pt able to ambulate with a walker. Pt denies feeling dizzy, lightheaded, or unsteady. Pt reports some left foot pain that has been ongoing and she plans to make a follow up appt with her primary doctor. Versie Starks, PA-C made aware of the following above. Per Versie Starks, PA-C place patient with her sister who is also in the ER and notify staff that they both will need EMS to leave.

## 2022-04-14 NOTE — Discharge Instructions (Signed)
The social worker will call you with advice for home health and placement into a facility.

## 2022-04-14 NOTE — TOC Initial Note (Signed)
Transition of Care [facility]) - Initial/Assessment Note    Patient Details  Name: Olivia Lewis MRN: [id number] Date of Birth: 09/08/1929  Transition of Care [facility]) CM/SW Contact:    Shelbie Hutching, RN Phone Number: 04/14/2022, 2:54 PM  Clinical Narrative:                 Patient fell at home, she and her sister got tangled up and fell together and then couldn't get up.   Patient and her sister live together, patient helps take care of her sister.  They do not drive but she has friends that will provide transportation for them.  Deana, friend is at the bedside, she will be able to take the patient home.  Patient had no difficulty with ambulation with a walker and she normally uses a walker at home.  Patient does not need home health PT.  Patient's sister Stanton Kidney is in the same room and still needs to be evaluated by a doctor. Sister's ankle is splinted, if she is unable to walk we will need to talk with patient and sister about SNF placement.       Barriers to Discharge: Barriers Resolved   Patient Goals and CMS Choice Patient states their goals for this hospitalization and ongoing recovery are:: To get back home, and to have her sister evaluated she fell also today      Expected Discharge Plan and Services                                                Prior Living Arrangements/Services                       Activities of Daily Living      Permission Sought/Granted                  Emotional Assessment              Admission diagnosis:  fall Patient Active Problem List   Diagnosis Date Noted   Pain due to onychomycosis of toenails of both feet 12/10/2021   Osteoarthritis (arthritis due to wear and tear of joints) 08/13/2021   Hyperlipidemia 08/13/2021   Elevated troponin 04/08/2021   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   Paroxysmal atrial fibrillation ([place]) 10/21/2020   Fall    Leukocytosis    Acute kidney injury superimposed on  CKD ([place])    Atrial fibrillation ([place]) 10/11/2020   Atrial fibrillation with rapid ventricular response ([place]) 10/10/2020   B12 deficiency 04/07/2016   Acid reflux 04/07/2016   HLD (hyperlipidemia) 04/07/2016   Essential hypertension 04/07/2016   Arthritis, degenerative 04/07/2016   OP (osteoporosis) 04/07/2016   Primary osteoarthritis of knee 05/06/2015   Hypothyroidism 02/25/2015   CKD (chronic kidney disease), stage IIIb 02/25/2015   Calcium blood increased 02/25/2015   Hypercalcemia 02/25/2015   CKD (chronic kidney disease) stage 3, GFR 30-59 ml/min ([place]) 02/25/2015   Baker's cyst of knee 05/30/2014   PCP:  Baxter Hire, MD Pharmacy:   [facility] [address] [place] [address] Phone: [phone] Fax: [phone]     Social Determinants of Health (SDOH) Interventions    Readmission Risk Interventions     View : No data to display.

## 2022-05-11 ENCOUNTER — Encounter: Payer: Self-pay | Admitting: Dermatology

## 2022-05-11 ENCOUNTER — Ambulatory Visit (INDEPENDENT_AMBULATORY_CARE_PROVIDER_SITE_OTHER): Payer: Medicare Other | Admitting: Dermatology

## 2022-05-11 DIAGNOSIS — D692 Other nonthrombocytopenic purpura: Secondary | ICD-10-CM | POA: Diagnosis not present

## 2022-05-11 DIAGNOSIS — C44622 Squamous cell carcinoma of skin of right upper limb, including shoulder: Secondary | ICD-10-CM

## 2022-05-11 DIAGNOSIS — D492 Neoplasm of unspecified behavior of bone, soft tissue, and skin: Secondary | ICD-10-CM

## 2022-05-11 DIAGNOSIS — D0462 Carcinoma in situ of skin of left upper limb, including shoulder: Secondary | ICD-10-CM | POA: Diagnosis not present

## 2022-05-11 DIAGNOSIS — L578 Other skin changes due to chronic exposure to nonionizing radiation: Secondary | ICD-10-CM | POA: Diagnosis not present

## 2022-05-11 DIAGNOSIS — Z85828 Personal history of other malignant neoplasm of skin: Secondary | ICD-10-CM

## 2022-05-11 NOTE — Patient Instructions (Signed)
Electrodesiccation and Curettage ("Scrape and Burn") Wound Care Instructions  Leave the original bandage on for 24 hours if possible.  If the bandage becomes soaked or soiled before that time, it is OK to remove it and examine the wound.  A small amount of post-operative bleeding is normal.  If excessive bleeding occurs, remove the bandage, place gauze over the site and apply continuous pressure (no peeking) over the area for 30 minutes. If this does not work, please call our clinic as soon as possible or page your doctor if it is after hours.   Once a day, cleanse the wound with soap and water. It is fine to shower. If a thick crust develops you may use a Q-tip dipped into dilute hydrogen peroxide (mix 1:1 with water) to dissolve it.  Hydrogen peroxide can slow the healing process, so use it only as needed.    After washing, apply petroleum jelly (Vaseline) or an antibiotic ointment if your doctor prescribed one for you, followed by a bandage.    For best healing, the wound should be covered with a layer of ointment at all times. If you are not able to keep the area covered with a bandage to hold the ointment in place, this may mean re-applying the ointment several times a day.  Continue this wound care until the wound has healed and is no longer open. It may take several weeks for the wound to heal and close.  Itching and mild discomfort is normal during the healing process.  If you have any discomfort, you can take Tylenol (acetaminophen) or ibuprofen as directed on the bottle. (Please do not take these if you have an allergy to them or cannot take them for another reason).  Some redness, tenderness and white or yellow material in the wound is normal healing.  If the area becomes very sore and red, or develops a thick yellow-green material (pus), it may be infected; please notify us.    Wound healing continues for up to one year following surgery. It is not unusual to experience pain in the scar  from time to time during the interval.  If the pain becomes severe or the scar thickens, you should notify the office.    A slight amount of redness in a scar is expected for the first six months.  After six months, the redness will fade and the scar will soften and fade.  The color difference becomes less noticeable with time.  If there are any problems, return for a post-op surgery check at your earliest convenience.  To improve the appearance of the scar, you can use silicone scar gel, cream, or sheets (such as Mederma or Serica) every night for up to one year. These are available over the counter (without a prescription).  Please call our office at [phone] for any questions or concerns.     Due to recent changes in healthcare laws, you may see results of your pathology and/or laboratory studies on MyChart before the doctors have had a chance to review them. We understand that in some cases there may be results that are confusing or concerning to you. Please understand that not all results are received at the same time and often the doctors may need to interpret multiple results in order to provide you with the best plan of care or course of treatment. Therefore, we ask that you please give us 2 business days to thoroughly review all your results before contacting the office for clarification. Should   we see a critical lab result, you will be contacted sooner.   If You Need Anything After Your Visit  If you have any questions or concerns for your doctor, please call our main line at [phone] and press option 4 to reach your doctor's medical assistant. If no one answers, please leave a voicemail as directed and we will return your call as soon as possible. Messages left after 4 pm will be answered the following business day.   You may also send us a message via MyChart. We typically respond to MyChart messages within 1-2 business days.  For prescription refills, please ask your  pharmacy to contact our office. Our fax number is [phone].  If you have an urgent issue when the clinic is closed that cannot wait until the next business day, you can page your doctor at the number below.    Please note that while we do our best to be available for urgent issues outside of office hours, we are not available 24/7.   If you have an urgent issue and are unable to reach us, you may choose to seek medical care at your doctor's office, retail clinic, urgent care center, or emergency room.  If you have a medical emergency, please immediately call 911 or go to the emergency department.  Pager Numbers  - Dr. Kowalski: [phone]  - Dr. Moye: [phone]  - Dr. Stewart: [phone]  In the event of inclement weather, please call our main line at [phone] for an update on the status of any delays or closures.  Dermatology Medication Tips: Please keep the boxes that topical medications come in in order to help keep track of the instructions about where and how to use these. Pharmacies typically print the medication instructions only on the boxes and not directly on the medication tubes.   If your medication is too expensive, please contact our office at [phone] option 4 or send us a message through MyChart.   We are unable to tell what your co-pay for medications will be in advance as this is different depending on your insurance coverage. However, we may be able to find a substitute medication at lower cost or fill out paperwork to get insurance to cover a needed medication.   If a prior authorization is required to get your medication covered by your insurance company, please allow us 1-2 business days to complete this process.  Drug prices often vary depending on where the prescription is filled and some pharmacies may offer cheaper prices.  The website www.goodrx.com contains coupons for medications through different pharmacies. The prices here do not  account for what the cost may be with help from insurance (it may be cheaper with your insurance), but the website can give you the price if you did not use any insurance.  - You can print the associated coupon and take it with your prescription to the pharmacy.  - You may also stop by our office during regular business hours and pick up a GoodRx coupon card.  - If you need your prescription sent electronically to a different pharmacy, notify our office through [facility] MyChart or by phone at [phone] option 4.     Si Usted Necesita Algo Despus de Su Visita  Tambin puede enviarnos un mensaje a travs de MyChart. Por lo general respondemos a los mensajes de MyChart en el transcurso de 1 a 2 das hbiles.  Para renovar recetas, por favor pida a su farmacia que se ponga en   contacto con nuestra oficina. Nuestro nmero de fax es el [phone].  Si tiene un asunto urgente cuando la clnica est cerrada y que no puede esperar hasta el siguiente da hbil, puede llamar/localizar a su doctor(a) al nmero que aparece a continuacin.   Por favor, tenga en cuenta que aunque hacemos todo lo posible para estar disponibles para asuntos urgentes fuera del horario de oficina, no estamos disponibles las 24 horas del da, los 7 das de la semana.   Si tiene un problema urgente y no puede comunicarse con nosotros, puede optar por buscar atencin mdica  en el consultorio de su doctor(a), en una clnica privada, en un centro de atencin urgente o en una sala de emergencias.  Si tiene una emergencia mdica, por favor llame inmediatamente al 911 o vaya a la sala de emergencias.  Nmeros de bper  - Dr. Kowalski: [phone]  - Dra. Moye: [phone]  - Dra. Stewart: [phone]  En caso de inclemencias del tiempo, por favor llame a nuestra lnea principal al [phone] para una actualizacin sobre el estado de cualquier retraso o cierre.  Consejos para la medicacin en dermatologa: Por  favor, guarde las cajas en las que vienen los medicamentos de uso tpico para ayudarle a seguir las instrucciones sobre dnde y cmo usarlos. Las farmacias generalmente imprimen las instrucciones del medicamento slo en las cajas y no directamente en los tubos del medicamento.   Si su medicamento es muy caro, por favor, pngase en contacto con nuestra oficina llamando al [phone] y presione la opcin 4 o envenos un mensaje a travs de MyChart.   No podemos decirle cul ser su copago por los medicamentos por adelantado ya que esto es diferente dependiendo de la cobertura de su seguro. Sin embargo, es posible que podamos encontrar un medicamento sustituto a menor costo o llenar un formulario para que el seguro cubra el medicamento que se considera necesario.   Si se requiere una autorizacin previa para que su compaa de seguros cubra su medicamento, por favor permtanos de 1 a 2 das hbiles para completar este proceso.  Los precios de los medicamentos varan con frecuencia dependiendo del lugar de dnde se surte la receta y alguna farmacias pueden ofrecer precios ms baratos.  El sitio web www.goodrx.com tiene cupones para medicamentos de diferentes farmacias. Los precios aqu no tienen en cuenta lo que podra costar con la ayuda del seguro (puede ser ms barato con su seguro), pero el sitio web puede darle el precio si no utiliz ningn seguro.  - Puede imprimir el cupn correspondiente y llevarlo con su receta a la farmacia.  - Tambin puede pasar por nuestra oficina durante el horario de atencin regular y recoger una tarjeta de cupones de GoodRx.  - Si necesita que su receta se enve electrnicamente a una farmacia diferente, informe a nuestra oficina a travs de MyChart de [facility] o por telfono llamando al [phone] y presione la opcin 4.  

## 2022-05-11 NOTE — Progress Notes (Unsigned)
Follow-Up Visit   Subjective  Olivia Lewis is a 88 y.o. female who presents for the following: lesions (Here to treat more spots. Last ones treated were SCC's. Tx with EDC, healing well). The patient has spots, moles and lesions to be evaluated, some may be new or changing and the patient has concerns that these could be cancer.  The following portions of the chart were reviewed this encounter and updated as appropriate:  Tobacco  Allergies  Meds  Problems  Med Hx  Surg Hx  Fam Hx     Review of Systems: No other skin or systemic complaints except as noted in HPI or Assessment and Plan.  Objective  Well appearing patient in no apparent distress; mood and affect are within normal limits.  A focused examination was performed including face, arms, hands. Relevant physical exam findings are noted in the Assessment and Plan.  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 12/29/2021. EDC. - No evidence of recurrence today - No lymphadenopathy - Recommend regular full body skin exams - Recommend daily broad spectrum sunscreen SPF 30+ to sun-exposed areas, reapply every 2 hours as needed.  - Call if any new or changing lesions are noted between office visits  Purpura - Chronic; persistent and recurrent.  Treatable, but not curable. - Violaceous macules and patches - Benign - Related to trauma, age, sun damage and/or use of blood thinners, chronic use of topical and/or oral steroids - Observe - Can use OTC arnica containing moisturizer such as Dermend Bruise Formula if desired - Call for worsening or other concerns  Actinic Damage - chronic, secondary to cumulative UV radiation exposure/sun exposure over time - diffuse scaly erythematous macules with underlying dyspigmentation -  Recommend daily broad spectrum sunscreen SPF 30+ to sun-exposed areas, reapply every 2 hours as needed.  - Recommend staying in the shade or wearing long sleeves, sun glasses (UVA+UVB protection) and wide brim hats (4-inch brim around the entire circumference of the hat). - Call for new or changing lesions.  Neoplasm of skin (2) Right Deltoid Epidermal / dermal shaving  Lesion diameter (cm):  2.7 Informed consent: discussed and consent obtained   Timeout: patient name, date of birth, surgical site, and procedure verified   Procedure prep:  Patient was prepped and draped in usual sterile fashion Prep type:  Isopropyl alcohol Anesthesia: the lesion was anesthetized in a standard fashion   Anesthetic:  1% lidocaine w/ epinephrine 1-100,000 buffered w/ 8.4% NaHCO3 Instrument used: flexible razor blade   Hemostasis achieved with: pressure, aluminum chloride and electrodesiccation   Outcome: patient tolerated procedure well   Post-procedure details: sterile dressing applied and wound care instructions given   Dressing type: bandage, petrolatum and pressure dressing    Destruction of lesion Complexity: extensive   Destruction method: electrodesiccation and curettage   Destruction method comment:  Simple excision Informed consent: discussed and consent obtained   Timeout:  patient name, date of birth, surgical site, and procedure verified Procedure prep:  Patient was prepped and draped in usual sterile fashion Prep type:  Isopropyl alcohol Anesthesia: the lesion was anesthetized in a standard fashion   Anesthetic:  1% lidocaine w/ epinephrine 1-100,000 buffered w/ 8.4% NaHCO3 Curettage performed in three different directions: Yes   Electrodesiccation performed over the curetted  area: Yes   Curettage cycles:  3 Lesion length (cm):  2.7 Lesion width (cm):  2.7 Margin per side (cm):  0.2 Final wound size (cm):  3.1 Hemostasis achieved with:  pressure and aluminum chloride Outcome: patient  tolerated procedure well with no complications   Post-procedure details: sterile dressing applied and wound care instructions given   Dressing type: bandage and petrolatum    Specimen 1 - Surgical pathology Differential Diagnosis: R/O SCC Check Margins: No  Left Forearm - Posterior Epidermal / dermal shaving  Lesion diameter (cm):  0.8 Informed consent: discussed and consent obtained   Timeout: patient name, date of birth, surgical site, and procedure verified   Procedure prep:  Patient was prepped and draped in usual sterile fashion Prep type:  Isopropyl alcohol Anesthesia: the lesion was anesthetized in a standard fashion   Anesthetic:  1% lidocaine w/ epinephrine 1-100,000 buffered w/ 8.4% NaHCO3 Instrument used: flexible razor blade   Hemostasis achieved with: pressure, aluminum chloride and electrodesiccation   Outcome: patient tolerated procedure well   Post-procedure details: sterile dressing applied and wound care instructions given   Dressing type: bandage and petrolatum    Destruction of lesion Complexity: extensive   Destruction method: electrodesiccation and curettage   Destruction method comment:  Simple excision Informed consent: discussed and consent obtained   Timeout:  patient name, date of birth, surgical site, and procedure verified Procedure prep:  Patient was prepped and draped in usual sterile fashion Prep type:  Isopropyl alcohol Anesthesia: the lesion was anesthetized in a standard fashion   Anesthetic:  1% lidocaine w/ epinephrine 1-100,000 buffered w/ 8.4% NaHCO3 Curettage performed in three different directions: Yes   Electrodesiccation performed over the curetted area: Yes   Curettage cycles:  3 Lesion length (cm):  0.8 Lesion width (cm):  0.8 Margin per side (cm):  0.2 Final wound size (cm):  1.2 Hemostasis achieved with:  pressure and aluminum chloride Outcome: patient tolerated procedure well with no complications   Post-procedure details:  sterile dressing applied and wound care instructions given   Dressing type: bandage and petrolatum    Specimen 2 - Surgical pathology Differential Diagnosis: R/O SCC Check Margins: No  Return in about 3 months (around 08/11/2022) for biopsy.  I, Emelia Salisbury, CMA, am acting as scribe for Sarina Ser, MD. Documentation: I have reviewed the above documentation for accuracy and completeness, and I agree with the above.  Sarina Ser, MD

## 2022-05-13 ENCOUNTER — Encounter: Payer: Self-pay | Admitting: Dermatology

## 2022-05-17 ENCOUNTER — Telehealth: Payer: Self-pay

## 2022-05-18 ENCOUNTER — Telehealth: Payer: Self-pay

## 2022-07-01 ENCOUNTER — Emergency Department: Payer: Medicare Other

## 2022-07-01 ENCOUNTER — Emergency Department
Admission: EM | Admit: 2022-07-01 | Discharge: 2022-07-01 | Disposition: A | Discharge status: Home / Self Care | Payer: Medicare Other | Attending: Emergency Medicine | Admitting: Emergency Medicine

## 2022-07-01 ENCOUNTER — Other Ambulatory Visit: Payer: Self-pay

## 2022-07-01 DIAGNOSIS — W01198A Fall on same level from slipping, tripping and stumbling with subsequent striking against other object, initial encounter: Secondary | ICD-10-CM | POA: Diagnosis not present

## 2022-07-01 DIAGNOSIS — R519 Headache, unspecified: Secondary | ICD-10-CM | POA: Insufficient documentation

## 2022-07-01 DIAGNOSIS — N390 Urinary tract infection, site not specified: Secondary | ICD-10-CM | POA: Insufficient documentation

## 2022-07-01 DIAGNOSIS — W19XXXA Unspecified fall, initial encounter: Secondary | ICD-10-CM

## 2022-07-01 DIAGNOSIS — R0789 Other chest pain: Secondary | ICD-10-CM | POA: Diagnosis not present

## 2022-07-01 LAB — COMPREHENSIVE METABOLIC PANEL
ALT: 34 U/L (ref 0–44)
AST: 43 U/L — ABNORMAL HIGH (ref 15–41)
Albumin: 4.2 g/dL (ref 3.5–5.0)
Alkaline Phosphatase: 53 U/L (ref 38–126)
Anion gap: 8 (ref 5–15)
BUN: 33 mg/dL — ABNORMAL HIGH (ref 8–23)
CO2: 28 mmol/L (ref 22–32)
Calcium: 9.6 mg/dL (ref 8.9–10.3)
Chloride: 104 mmol/L (ref 98–111)
Creatinine, Ser: 1.29 mg/dL — ABNORMAL HIGH (ref 0.44–1.00)
GFR, Estimated: 39 mL/min — ABNORMAL LOW (ref >60)
Glucose, Bld: 106 mg/dL — ABNORMAL HIGH (ref 70–99)
Potassium: 4.1 mmol/L (ref 3.5–5.1)
Sodium: 140 mmol/L (ref 135–145)
Total Bilirubin: 1 mg/dL (ref 0.3–1.2)
Total Protein: 7 g/dL (ref 6.5–8.1)

## 2022-07-01 LAB — URINALYSIS, ROUTINE W REFLEX MICROSCOPIC
Bilirubin Urine: NEGATIVE
Glucose, UA: NEGATIVE mg/dL
Hgb urine dipstick: NEGATIVE
Ketones, ur: NEGATIVE mg/dL
Leukocytes,Ua: NEGATIVE
Nitrite: POSITIVE — AB
Protein, ur: NEGATIVE mg/dL
Specific Gravity, Urine: 1.014 (ref 1.005–1.030)
pH: 6 (ref 5.0–8.0)

## 2022-07-01 LAB — CBC WITH DIFFERENTIAL/PLATELET
Abs Immature Granulocytes: 0.16 10*3/uL — ABNORMAL HIGH (ref 0.00–0.07)
Basophils Absolute: 0.1 10*3/uL (ref 0.0–0.1)
Basophils Relative: 1 %
Eosinophils Absolute: 0.3 10*3/uL (ref 0.0–0.5)
Eosinophils Relative: 2 %
HCT: 43.1 % (ref 36.0–46.0)
Hemoglobin: 14.2 g/dL (ref 12.0–15.0)
Immature Granulocytes: 1 %
Lymphocytes Relative: 17 %
Lymphs Abs: 2.1 10*3/uL (ref 0.7–4.0)
MCH: 29.5 pg (ref 26.0–34.0)
MCHC: 32.9 g/dL (ref 30.0–36.0)
MCV: 89.6 fL (ref 80.0–100.0)
Monocytes Absolute: 1.2 10*3/uL — ABNORMAL HIGH (ref 0.1–1.0)
Monocytes Relative: 10 %
Neutro Abs: 8.4 10*3/uL — ABNORMAL HIGH (ref 1.7–7.7)
Neutrophils Relative %: 69 %
Platelets: 267 10*3/uL (ref 150–400)
RBC: 4.81 MIL/uL (ref 3.87–5.11)
RDW: 14.1 % (ref 11.5–15.5)
WBC: 12.1 10*3/uL — ABNORMAL HIGH (ref 4.0–10.5)
nRBC: 0 % (ref 0.0–0.2)

## 2022-07-01 LAB — PROTIME-INR
INR: 0.9 (ref 0.8–1.2)
Prothrombin Time: 12.2 seconds (ref 11.4–15.2)

## 2022-07-01 LAB — APTT: aPTT: 28 seconds (ref 24–36)

## 2022-07-01 MED ORDER — CEFDINIR 300 MG PO CAPS: 300.0000 mg | ORAL_CAPSULE | Freq: Two times a day (BID) | ORAL | 0 refills | Status: AC

## 2022-07-01 MED ORDER — IBUPROFEN 400 MG PO TABS
400.0000 mg | ORAL_TABLET | Freq: Once | ORAL | Status: AC
  Administered 2022-07-01: 400 mg via ORAL
  Filled 2022-07-01: qty 1

## 2022-07-01 MED ORDER — HYDROCODONE-ACETAMINOPHEN 5-325 MG PO TABS
0.5000 | ORAL_TABLET | Freq: Once | ORAL | Status: AC
  Administered 2022-07-01: 0.5 via ORAL
  Filled 2022-07-01: qty 1

## 2022-07-01 MED ORDER — HYDROCODONE-ACETAMINOPHEN 5-325 MG PO TABS: 0.5000 | ORAL_TABLET | Freq: Four times a day (QID) | ORAL | 0 refills | Status: DC | PRN

## 2022-07-01 NOTE — Discharge Instructions (Addendum)
Please use ibuprofen (Motrin) up to 800 mg every 8 hours, naproxen (Naprosyn) up to 500 mg every 12 hours, and/or acetaminophen (Tylenol) up to 4 g/day for any continued pain 

## 2022-07-01 NOTE — ED Triage Notes (Signed)
Pt coming from home with EMS for falling (uses walker, tripped over walker) PT stating soreness across chest from hitting walker during fall. PT believes they hit their head, but is unsure at this time.

## 2022-07-01 NOTE — ED Provider Notes (Signed)
[facility] Provider Note   Event Date/Time   First MD Initiated Contact with Patient 07/01/22 [id number]     (approximate) History  Fall  HPI Olivia Lewis is a 88 y.o. female who presents via EMS after mechanical fall.  Patient states that she fell forward onto her walker suffering chest pain over bilateral anterior rib cage.  Patient states that she has had rib fractures in the past and that this does not feel as bad but somewhat similar to that.  Patient was unable to get off the floor unassisted and therefore called EMS.  Patient does not know whether or her walker hit her in the head she denies loss of consciousness or any blood thinner use.  Patient denies any other complaints of pain at this time ROS: Patient currently denies any vision changes, tinnitus, difficulty speaking, facial droop, sore throat, shortness of breath, abdominal pain, nausea/vomiting/diarrhea, dysuria, or weakness/numbness/paresthesias in any extremity   Physical Exam  Triage Vital Signs: ED Triage Vitals  Enc Vitals Group     BP 07/01/22 0915 (!) 197/84     Pulse Rate 07/01/22 0915 70     Resp 07/01/22 0915 14     Temp 07/01/22 0915 98.2 F (36.8 C)     Temp Source 07/01/22 0915 Oral     SpO2 07/01/22 0915 97 %     Weight 07/01/22 0923 140 lb (63.5 kg)     Height --      Head Circumference --      Peak Flow --      Pain Score 07/01/22 0923 6     Pain Loc --      Pain Edu? --      Excl. in [place]? --    Most recent vital signs: Vitals:   07/01/22 0915 07/01/22 1030  BP: (!) 197/84 (!) 199/82  Pulse: 70 64  Resp: 14 16  Temp: 98.2 F (36.8 C)   SpO2: 97% 94%   General: Awake, oriented x4. CV:  Good peripheral perfusion.  Resp:  Normal effort.  Abd:  No distention.  Other:  Elderly Caucasian female laying in bed in no acute distress ED Results / Procedures / Treatments  Labs (all labs ordered are listed, but only abnormal results are displayed) Labs Reviewed   COMPREHENSIVE METABOLIC PANEL - Abnormal; Notable for the following components:      Result Value   Glucose, Bld 106 (*)    BUN 33 (*)    Creatinine, Ser 1.29 (*)    AST 43 (*)    GFR, Estimated 39 (*)    All other components within normal limits  CBC WITH DIFFERENTIAL/PLATELET - Abnormal; Notable for the following components:   WBC 12.1 (*)    Neutro Abs 8.4 (*)    Monocytes Absolute 1.2 (*)    Abs Immature Granulocytes 0.16 (*)    All other components within normal limits  URINALYSIS, ROUTINE W REFLEX MICROSCOPIC - Abnormal; Notable for the following components:   Color, Urine YELLOW (*)    APPearance HAZY (*)    Nitrite POSITIVE (*)    Bacteria, UA MANY (*)    All other components within normal limits  PROTIME-INR  APTT  RADIOLOGY ED MD interpretation: CT of the head without contrast interpreted by me shows no evidence of acute abnormalities including no intracerebral hemorrhage, obvious masses, or significant edema  CT of the cervical spine interpreted by me does not show any evidence of acute abnormalities including  no acute fracture, malalignment, height loss, or dislocation  2 view chest x-ray interpreted by me shows no evidence of acute abnormalities including no pneumonia, pneumothorax, or widened mediastinum.  There is evidence of rib fractures bilaterally -Agree with radiology assessment Official radiology report(s): CT Head Wo Contrast  Result Date: 07/01/2022 CLINICAL DATA:  Head trauma, minor (Age >= 65y); Neck trauma (Age >= 65y) EXAM: CT HEAD WITHOUT CONTRAST CT CERVICAL SPINE WITHOUT CONTRAST TECHNIQUE: Multidetector CT imaging of the head and cervical spine was performed following the standard protocol without intravenous contrast. Multiplanar CT image reconstructions of the cervical spine were also generated. RADIATION DOSE REDUCTION: This exam was performed according to the departmental dose-optimization program which includes automated exposure control,  adjustment of the mA and/or kV according to patient size and/or use of iterative reconstruction technique. COMPARISON:  Apr 14, 2022. FINDINGS: CT HEAD FINDINGS Brain: No evidence of acute infarction, hemorrhage, hydrocephalus, extra-axial collection or mass lesion/mass effect. Similar chronic microvascular ischemic disease and cerebral atrophy. Ex vacuo ventricular dilation, similar. Vascular: Calcific intracranial atherosclerosis. Skull: No acute fracture. Sinuses/Orbits: Clear sinuses.  No acute orbital findings. Other: No mastoid effusions. CT CERVICAL SPINE FINDINGS Alignment: Similar alignment. Similar stepwise anterolisthesis of C4 on C5, C5 on C6 and C7 on T1. No new malalignment. Skull base and vertebrae: Vertebral body heights are maintained. No evidence of acute fracture. Soft tissues and spinal canal: No prevertebral fluid or swelling. No visible canal hematoma. Disc levels: Similar multilevel degenerative change, greatest at C6-C7 where there is severe degenerative disc disease. Upper chest: Biapical pleuroparenchymal scarring. IMPRESSION: 1. No evidence of acute intracranial abnormality. 2. No evidence of acute fracture or traumatic malalignment. Electronically Signed   By: Margaretha Sheffield M.D.   On: 07/01/2022 10:19   CT Cervical Spine Wo Contrast  Result Date: 07/01/2022 CLINICAL DATA:  Head trauma, minor (Age >= 65y); Neck trauma (Age >= 65y) EXAM: CT HEAD WITHOUT CONTRAST CT CERVICAL SPINE WITHOUT CONTRAST TECHNIQUE: Multidetector CT imaging of the head and cervical spine was performed following the standard protocol without intravenous contrast. Multiplanar CT image reconstructions of the cervical spine were also generated. RADIATION DOSE REDUCTION: This exam was performed according to the departmental dose-optimization program which includes automated exposure control, adjustment of the mA and/or kV according to patient size and/or use of iterative reconstruction technique. COMPARISON:  Apr 14, 2022. FINDINGS: CT HEAD FINDINGS Brain: No evidence of acute infarction, hemorrhage, hydrocephalus, extra-axial collection or mass lesion/mass effect. Similar chronic microvascular ischemic disease and cerebral atrophy. Ex vacuo ventricular dilation, similar. Vascular: Calcific intracranial atherosclerosis. Skull: No acute fracture. Sinuses/Orbits: Clear sinuses.  No acute orbital findings. Other: No mastoid effusions. CT CERVICAL SPINE FINDINGS Alignment: Similar alignment. Similar stepwise anterolisthesis of C4 on C5, C5 on C6 and C7 on T1. No new malalignment. Skull base and vertebrae: Vertebral body heights are maintained. No evidence of acute fracture. Soft tissues and spinal canal: No prevertebral fluid or swelling. No visible canal hematoma. Disc levels: Similar multilevel degenerative change, greatest at C6-C7 where there is severe degenerative disc disease. Upper chest: Biapical pleuroparenchymal scarring. IMPRESSION: 1. No evidence of acute intracranial abnormality. 2. No evidence of acute fracture or traumatic malalignment. Electronically Signed   By: Margaretha Sheffield M.D.   On: 07/01/2022 10:19   DG Chest 2 View  Result Date: 07/01/2022 CLINICAL DATA:  Fall EXAM: CHEST - 2 VIEW COMPARISON:  Apr 08, 2021 FINDINGS: The cardiomediastinal silhouette is unchanged in contour.Atherosclerotic calcifications of the tortuous thoracic aorta. No  pleural effusion. No pneumothorax. No acute pleuroparenchymal abnormality. Chronic interstitial markings at the bases. Multilevel degenerative changes of the thoracic spine. Age indeterminate rib fractures on the LEFT laterally. Favored remote RIGHT lateral rib fractures. IMPRESSION: No acute cardiopulmonary abnormality. Age-indeterminate bilateral rib fractures, favored remote on the RIGHT. Recommend correlation with point tenderness. Electronically Signed   By: Valentino Saxon M.D.   On: 07/01/2022 10:07   PROCEDURES: Critical Care performed: No .1-3 Lead  EKG Interpretation  Performed by: Naaman Plummer, MD Authorized by: Naaman Plummer, MD     Interpretation: normal     ECG rate:  65   ECG rate assessment: normal     Rhythm: sinus rhythm     Ectopy: none     Conduction: normal    MEDICATIONS ORDERED IN ED: Medications  HYDROcodone-acetaminophen (NORCO/VICODIN) 5-325 MG per tablet 0.5 tablet (0.5 tablets Oral Given 07/01/22 1056)  ibuprofen (ADVIL) tablet 400 mg (400 mg Oral Given 07/01/22 1055)   IMPRESSION / MDM / ASSESSMENT AND PLAN / ED COURSE  I reviewed the triage vital signs and the nursing notes.                             The patient is on the cardiac monitor to evaluate for evidence of arrhythmia and/or significant heart rate changes. Patient's presentation is most consistent with acute presentation with potential threat to life or bodily function. Presenting after a fall that occurred just prior to arrival, resulting in injury to the anterior chest wall. The mechanism of injury was a mechanical ground level fall without syncope or near-syncope. The current level of pain is moderate. There was no loss of consciousness, confusion, seizure, or memory impairment. There is not a laceration associated with the injury. Denies neck pain. The patient does not take blood thinner medications. Denies vomiting, numbness/weakness, fever  Dispo: Discharge with PCP follow-up       FINAL CLINICAL IMPRESSION(S) / ED DIAGNOSES   Final diagnoses:  Chest wall pain  Fall, initial encounter  Lower urinary tract infectious disease   Rx / DC Orders   ED Discharge Orders          Ordered    HYDROcodone-acetaminophen (NORCO) 5-325 MG tablet  Every 6 hours PRN        07/01/22 1046    cefdinir (OMNICEF) 300 MG capsule  2 times daily        07/01/22 1123           Note:  This document was prepared using Dragon voice recognition software and may include unintentional dictation errors.   Naaman Plummer, MD 07/01/22 1125

## 2022-07-01 NOTE — ED Notes (Signed)
Pt c/o chest wall pain after falling over her walker this morning. Pt states that she was walking and thinks her walker stopped causing her to tumble over the front of it. Pt denies LOC. Pt thinks she did hit her head. Pt does not have any lacerations. Pt is in NAD.

## 2022-07-01 NOTE — ED Triage Notes (Signed)
First Nurse: Pt here via [facility] with a mechanical fall today. Pt was using her walker and fel c/o upper right shoulder pain and also fell backwards and hit her head. Pt is not on blood thinners and denies LOC. Pt also has rotator cuff and neck pain that is chronic.   74-hx of a fib takes metoprolol 96% RA 186/83

## 2022-07-06 ENCOUNTER — Ambulatory Visit (INDEPENDENT_AMBULATORY_CARE_PROVIDER_SITE_OTHER): Payer: Medicare Other | Admitting: Podiatry

## 2022-07-06 DIAGNOSIS — B351 Tinea unguium: Secondary | ICD-10-CM | POA: Diagnosis not present

## 2022-07-06 DIAGNOSIS — M79675 Pain in left toe(s): Secondary | ICD-10-CM | POA: Diagnosis not present

## 2022-07-06 DIAGNOSIS — L989 Disorder of the skin and subcutaneous tissue, unspecified: Secondary | ICD-10-CM | POA: Diagnosis not present

## 2022-07-06 DIAGNOSIS — M79674 Pain in right toe(s): Secondary | ICD-10-CM

## 2022-07-06 DIAGNOSIS — M2042 Other hammer toe(s) (acquired), left foot: Secondary | ICD-10-CM

## 2022-07-06 NOTE — Progress Notes (Signed)
   SUBJECTIVE Patient presents to office today complaining of elongated, thickened nails that cause pain while ambulating in shoes.  Patient is unable to trim their own nails. Patient is here for further evaluation and treatment.  Past Medical History:  Diagnosis Date   Allergy    Anemia    Arthritis    B12 deficiency    Basal cell carcinoma    face, R arm, txted by Dr. Koleen Nimrod and [facility]   Cancer [facility])    skin--face and arms   Cataracts, bilateral    Chickenpox    CKD (chronic kidney disease), stage III ([place])    Diverticulosis    GERD (gastroesophageal reflux disease)    Hyperlipidemia    Hypertension    Hypothyroidism    Osteoporosis    Squamous cell carcinoma of skin 05/01/2021   right distal tricep at elbow, left forearm near elbow, right dorsum hand, left mid lat forearm - all treated with EDC   Squamous cell carcinoma of skin 05/01/2021   right calf - EDC 07/14/21   Squamous cell carcinoma of skin 08/18/2021   R sup calf - ED&C   Squamous cell carcinoma of skin 12/29/2021   L mandible - ED&C   Squamous cell carcinoma of skin 12/29/2021   L lat elbow - ED&C   Squamous cell carcinoma of skin 12/29/2021   L lat tricep - ED&C   Squamous cell carcinoma of skin 05/11/2022   Right deltoid - EDC   Squamous cell carcinoma of skin 05/11/2022   Left forearm - in situ - [place]    OBJECTIVE General Patient is awake, alert, and oriented x 3 and in no acute distress. Derm Skin is dry and supple bilateral. Negative open lesions or macerations. Remaining integument unremarkable. Nails are tender, long, thickened and dystrophic with subungual debris, consistent with onychomycosis, 1-5 bilateral. No signs of infection noted. Vasc  DP and PT pedal pulses palpable bilaterally. Temperature gradient within normal limits.  Neuro Epicritic and protective threshold sensation grossly intact bilaterally.  Musculoskeletal Exam No symptomatic pedal deformities noted bilateral. Muscular strength  within normal limits.  ASSESSMENT 1.  Pain due to onychomycosis of toenails both 2.  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  Hammertoe second left with overlap  PLAN OF CARE 1. Patient evaluated today.  2. Instructed to maintain good pedal hygiene and foot care.  3. Mechanical debridement of nails 1-5 bilaterally performed using a nail nipper. Filed with dremel without incident.  4.  Excisional debridement of the symptomatic interdigital corn to that was performed today using a tissue nipper without incident or bleeding.  The patient did feel some relief  5.  Recommend wide fitting shoes that are deep in the toebox area.  Continue conservative treatment of the hammertoe. 6.  REturn to clinic in 3 mos.    Edrick Kins, DPM [facility]  Dr. Edrick Kins, DPM    [address], [place] [address]                Office [phone]  Fax [phone]

## 2022-08-17 ENCOUNTER — Ambulatory Visit: Payer: Medicare Other | Admitting: Dermatology

## 2022-09-07 ENCOUNTER — Inpatient Hospital Stay: Payer: Medicare Other | Attending: Oncology

## 2022-09-07 ENCOUNTER — Encounter: Payer: Self-pay | Admitting: Oncology

## 2022-09-07 ENCOUNTER — Inpatient Hospital Stay (HOSPITAL_BASED_OUTPATIENT_CLINIC_OR_DEPARTMENT_OTHER): Payer: Medicare Other | Admitting: Oncology

## 2022-09-07 VITALS — BP 191/95 | HR 58 | Temp 96.9°F | Resp 18 | Wt 146.8 lb

## 2022-09-07 DIAGNOSIS — N183 Chronic kidney disease, stage 3 unspecified: Secondary | ICD-10-CM | POA: Insufficient documentation

## 2022-09-07 DIAGNOSIS — Z800 Family history of malignant neoplasm of digestive organs: Secondary | ICD-10-CM | POA: Diagnosis not present

## 2022-09-07 DIAGNOSIS — Z87891 Personal history of nicotine dependence: Secondary | ICD-10-CM | POA: Diagnosis not present

## 2022-09-07 DIAGNOSIS — I129 Hypertensive chronic kidney disease with stage 1 through stage 4 chronic kidney disease, or unspecified chronic kidney disease: Secondary | ICD-10-CM | POA: Diagnosis not present

## 2022-09-07 DIAGNOSIS — D72829 Elevated white blood cell count, unspecified: Secondary | ICD-10-CM | POA: Diagnosis not present

## 2022-09-07 DIAGNOSIS — D72820 Lymphocytosis (symptomatic): Secondary | ICD-10-CM

## 2022-09-07 DIAGNOSIS — Z85828 Personal history of other malignant neoplasm of skin: Secondary | ICD-10-CM | POA: Diagnosis not present

## 2022-09-07 DIAGNOSIS — Z803 Family history of malignant neoplasm of breast: Secondary | ICD-10-CM | POA: Insufficient documentation

## 2022-09-07 LAB — CBC WITH DIFFERENTIAL/PLATELET
Abs Immature Granulocytes: 0.07 10*3/uL (ref 0.00–0.07)
Basophils Absolute: 0.1 10*3/uL (ref 0.0–0.1)
Basophils Relative: 1 %
Eosinophils Absolute: 0.3 10*3/uL (ref 0.0–0.5)
Eosinophils Relative: 3 %
HCT: 42 % (ref 36.0–46.0)
Hemoglobin: 14 g/dL (ref 12.0–15.0)
Immature Granulocytes: 1 %
Lymphocytes Relative: 21 %
Lymphs Abs: 2.2 10*3/uL (ref 0.7–4.0)
MCH: 29.9 pg (ref 26.0–34.0)
MCHC: 33.3 g/dL (ref 30.0–36.0)
MCV: 89.6 fL (ref 80.0–100.0)
Monocytes Absolute: 0.9 10*3/uL (ref 0.1–1.0)
Monocytes Relative: 9 %
Neutro Abs: 6.6 10*3/uL (ref 1.7–7.7)
Neutrophils Relative %: 65 %
Platelets: 282 10*3/uL (ref 150–400)
RBC: 4.69 MIL/uL (ref 3.87–5.11)
RDW: 14 % (ref 11.5–15.5)
WBC: 10.2 10*3/uL (ref 4.0–10.5)
nRBC: 0 % (ref 0.0–0.2)

## 2022-09-07 LAB — COMPREHENSIVE METABOLIC PANEL
ALT: 20 U/L (ref 0–44)
AST: 31 U/L (ref 15–41)
Albumin: 3.9 g/dL (ref 3.5–5.0)
Alkaline Phosphatase: 56 U/L (ref 38–126)
Anion gap: 7 (ref 5–15)
BUN: 23 mg/dL (ref 8–23)
CO2: 29 mmol/L (ref 22–32)
Calcium: 9.5 mg/dL (ref 8.9–10.3)
Chloride: 103 mmol/L (ref 98–111)
Creatinine, Ser: 1.03 mg/dL — ABNORMAL HIGH (ref 0.44–1.00)
GFR, Estimated: 51 mL/min — ABNORMAL LOW (ref >60)
Glucose, Bld: 92 mg/dL (ref 70–99)
Potassium: 4.1 mmol/L (ref 3.5–5.1)
Sodium: 139 mmol/L (ref 135–145)
Total Bilirubin: 0.8 mg/dL (ref 0.3–1.2)
Total Protein: 6.9 g/dL (ref 6.5–8.1)

## 2022-09-07 LAB — LACTATE DEHYDROGENASE: LDH: 162 U/L (ref 98–192)

## 2022-09-07 NOTE — Assessment & Plan Note (Signed)
A typical phenotype for CLL/SLL, mantle cell lymphoma, hairy cell lymphoma or follicular cell lymphoma is not detected.   Patient has no constitutional symptoms.  Clinically doing well. Total white count is stable, slightly decreased. Commend observation Recommend to follow-up in 1 year.

## 2022-09-07 NOTE — Progress Notes (Signed)
Hematology/Oncology Progress note [phone] [phone]      Patient Care Team: Baxter Hire, MD as PCP - General (Internal Medicine)  ASSESSMENT & PLAN:   Monoclonal B-cell lymphocytosis of undetermined significance A typical phenotype for CLL/SLL, mantle cell lymphoma, hairy cell lymphoma or follicular cell lymphoma is not detected.   Patient has no constitutional symptoms.  Clinically doing well. Total white count is stable, slightly decreased. Commend observation Recommend to follow-up in 1 year.   Orders Placed This Encounter  Procedures   CBC with Differential    Standing Status:   Future    Standing Expiration Date:   09/08/2023   Comprehensive metabolic panel    Standing Status:   Future    Standing Expiration Date:   09/08/2023   Lactate dehydrogenase    Standing Status:   Future    Standing Expiration Date:   09/08/2023   Flow cytometry panel-leukemia/lymphoma work-up    Standing Status:   Future    Standing Expiration Date:   09/08/2023    All questions were answered. The patient knows to call the clinic with any problems, questions or concerns.  Earlie Server, MD, PhD [facility] Hematology Oncology 09/07/2022   CHIEF COMPLAINTS/REASON FOR VISIT:  Monoclonal lymphocytosis follow-up  HISTORY OF PRESENTING ILLNESS:  Olivia Lewis is a  88 y.o.  female with PMH listed below who was referred to me for evaluation of leukocytosis Reviewed patient' recent labs obtained by PCP.   09/12/2020 CBC showed elevated white count of 19.8.  Hemoglobin 13.7, smear showed smudge cells.  Predominantly neutrophilia, lymphocytosis, monocytosis Previous lab records reviewed.  05/14/2020, WBC 11.5.  Predominantly lymphocytosis, basophilia, eosinophilia 01/02/2020, WBC 11.6 predominantly basophilia 11/13/2019, WBC 9.9 04/26/2019, WBC 10.1 10/27/19 19 WBC 11.6 predominantly basophilia No aggravating or elevated factors. Associated symptoms or signs:   Denies weight loss, fever, chills,night sweats.  Fatigue Smoking history: Former smoker History of recent oral steroid use or steroid injection: Patient reports recent 2 courses of steroids which she finished shortly prior to 09/12/2020 blood work. History of recent infection: Denies Autoimmune disease history.  Denies  Patient walks independently with a cane.  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  No other close family members.  Her husband passed away few years ago.   INTERVAL HISTORY Olivia Lewis is a 88 y.o. female who has above history reviewed by me today presents for follow up visit for management of monoclonal lymphocytosis of unknown significance. Patient reports feeling well.  Appetite is fair.  Weight is stable.  She has no new complaints. Denies night sweats or fever.    Review of Systems  Constitutional:  Negative for appetite change, chills, fatigue and fever.  HENT:   Negative for hearing loss and voice change.   Eyes:  Negative for eye problems.  Respiratory:  Negative for chest tightness and cough.   Cardiovascular:  Negative for chest pain.  Gastrointestinal:  Negative for abdominal distention, abdominal pain and blood in stool.  Endocrine: Negative for hot flashes.  Genitourinary:  Negative for difficulty urinating and frequency.   Musculoskeletal:  Negative for arthralgias.  Skin:  Negative for itching and rash.  Neurological:        Frequent falls  Hematological:  Negative for adenopathy.  Psychiatric/Behavioral:  Negative for confusion.      MEDICAL HISTORY:  Past Medical History:  Diagnosis Date   Allergy    Anemia  Arthritis    B12 deficiency    Basal cell carcinoma    face, R arm, txted by Dr. Koleen Nimrod and [facility]   Cancer [facility])    skin--face and arms   Cataracts, bilateral    Chickenpox    CKD (chronic kidney disease), stage III ([place])    Diverticulosis    GERD (gastroesophageal  reflux disease)    Hyperlipidemia    Hypertension    Hypothyroidism    Osteoporosis    Squamous cell carcinoma of skin 05/01/2021   right distal tricep at elbow, left forearm near elbow, right dorsum hand, left mid lat forearm - all treated with EDC   Squamous cell carcinoma of skin 05/01/2021   right calf - EDC 07/14/21   Squamous cell carcinoma of skin 08/18/2021   R sup calf - ED&C   Squamous cell carcinoma of skin 12/29/2021   L mandible - ED&C   Squamous cell carcinoma of skin 12/29/2021   L lat elbow - ED&C   Squamous cell carcinoma of skin 12/29/2021   L lat tricep - ED&C   Squamous cell carcinoma of skin 05/11/2022   Right deltoid - EDC   Squamous cell carcinoma of skin 05/11/2022   Left forearm - in situ - EDC    SURGICAL HISTORY: Past Surgical History:  Procedure Laterality Date   BUNIONECTOMY Bilateral    CARDIAC CATHETERIZATION     CATARACT EXTRACTION Bilateral    DILATION AND CURETTAGE OF UTERUS     EYE SURGERY Bilateral    cataract  surgery   JOINT REPLACEMENT Right    total knee replacement   meniscus tear Right    multiple skin cancer removal     NEUROMA SURGERY Bilateral    bilateral feet   rotator cuff surgery Right    TOTAL KNEE ARTHROPLASTY Left 05/06/2015   Procedure: TOTAL KNEE ARTHROPLASTY;  Surgeon: Hessie Knows, MD;  Location: [facility] ORS;  Service: Orthopedics;  Laterality: Left;    SOCIAL HISTORY: Social History   Socioeconomic History   Marital status: Widowed    Spouse name: Not on file   Number of children: Not on file   Years of education: Not on file   Highest education level: Not on file  Occupational History   Not on file  Tobacco Use   Smoking status: Never   Smokeless tobacco: Never  Vaping Use   Vaping Use: Never used  Substance and Sexual Activity   Alcohol use: No   Drug use: No   Sexual activity: Not Currently  Other Topics Concern   Not on file  Social History Narrative   Not on file   Social Determinants of  Health   Financial Resource Strain: Not on file  Food Insecurity: Not on file  Transportation Needs: Not on file  Physical Activity: Not on file  Stress: Not on file  Social Connections: Not on file  Intimate Partner Violence: Not on file    FAMILY HISTORY: Family History  Problem Relation Age of Onset   Hypertension Mother    Hypertension Father    Diabetes Sister    Breast cancer Sister    Colon cancer Sister    Bone cancer Brother     ALLERGIES:  is allergic to penicillin v potassium and sulfa antibiotics.  MEDICATIONS:  Current Outpatient Medications  Medication Sig Dispense Refill   acetaminophen (TYLENOL) 500 MG tablet Take 1,000 mg by mouth daily as needed.      aspirin EC 81 MG tablet  Take 81 mg by mouth daily.      atorvastatin (LIPITOR) 10 MG tablet Take 1 tablet by mouth daily.     Calcium Carb-Cholecalciferol 500-200 MG-UNIT TABS Take 1 tablet by mouth daily.      Cyanocobalamin (B-12 SL) Place 2,500 mcg under the tongue daily.      docusate sodium (COLACE) 100 MG capsule Take 100 mg by mouth daily as needed.     HYDROcodone-acetaminophen (NORCO) 5-325 MG tablet Take 0.5 tablets by mouth every 6 (six) hours as needed for moderate pain. 12 tablet 0   levothyroxine (SYNTHROID, LEVOTHROID) 50 MCG tablet Take 50 mcg by mouth daily at 6 (six) AM.     loratadine (CLARITIN) 10 MG tablet Take 10 mg by mouth daily as needed for allergies.     metoprolol succinate (TOPROL-XL) 25 MG 24 hr tablet Take 3 tablets (75 mg total) by mouth daily. 30 tablet 1   traMADol (ULTRAM) 50 MG tablet Take 1 tablet (50 mg total) by mouth every 6 (six) hours as needed. 12 tablet 0   No current facility-administered medications for this visit.     PHYSICAL EXAMINATION: ECOG PERFORMANCE STATUS: 1 - Symptomatic but completely ambulatory Vitals:   09/07/22 1312  BP: (!) 191/95  Pulse: (!) 58  Resp: 18  Temp: (!) 96.9 F (36.1 C)   Filed Weights   09/07/22 1312  Weight: 146 lb 12.8 oz  (66.6 kg)    Physical Exam Constitutional:      General: She is not in acute distress.    Comments: Patient walks with a walker.  HENT:     Head: Normocephalic and atraumatic.  Eyes:     General: No scleral icterus. Cardiovascular:     Rate and Rhythm: Normal rate and regular rhythm.     Heart sounds: Normal heart sounds.  Pulmonary:     Effort: Pulmonary effort is normal. No respiratory distress.     Breath sounds: No wheezing.  Abdominal:     General: Bowel sounds are normal. There is no distension.     Palpations: Abdomen is soft.  Musculoskeletal:        General: No deformity. Normal range of motion.     Cervical back: Normal range of motion and neck supple.  Skin:    General: Skin is warm and dry.     Findings: No erythema or rash.  Neurological:     Mental Status: She is alert and oriented to person, place, and time. Mental status is at baseline.     Cranial Nerves: No cranial nerve deficit.     Coordination: Coordination normal.  Psychiatric:        Mood and Affect: Mood normal.        Latest Ref Rng & Units 09/07/2022    1:02 PM  CMP  Glucose 70 - 99 mg/dL 92   BUN 8 - 23 mg/dL 23   Creatinine 0.44 - 1.00 mg/dL 1.03   Sodium 135 - 145 mmol/L 139   Potassium 3.5 - 5.1 mmol/L 4.1   Chloride 98 - 111 mmol/L 103   CO2 22 - 32 mmol/L 29   Calcium 8.9 - 10.3 mg/dL 9.5   Total Protein 6.5 - 8.1 g/dL 6.9   Total Bilirubin 0.3 - 1.2 mg/dL 0.8   Alkaline Phos 38 - 126 U/L 56   AST 15 - 41 U/L 31   ALT 0 - 44 U/L 20       Latest Ref Rng & Units  09/07/2022    1:02 PM  CBC  WBC 4.0 - 10.5 K/uL 10.2   Hemoglobin 12.0 - 15.0 g/dL 14.0   Hematocrit 36.0 - 46.0 % 42.0   Platelets 150 - 400 K/uL 282      RADIOGRAPHIC STUDIES: I have personally reviewed the radiological images as listed and agreed with the findings in the report. No results found.  LABORATORY DATA:  I have reviewed the data as listed    Latest Ref Rng & Units 09/07/2022    1:02 PM  07/01/2022    9:50 AM 03/02/2022   12:30 PM  CBC  WBC 4.0 - 10.5 K/uL 10.2  12.1  11.1   Hemoglobin 12.0 - 15.0 g/dL 14.0  14.2  13.3   Hematocrit 36.0 - 46.0 % 42.0  43.1  39.3   Platelets 150 - 400 K/uL 282  267  277       Latest Ref Rng & Units 09/07/2022    1:02 PM 07/01/2022    9:50 AM 03/02/2022   12:30 PM  CMP  Glucose 70 - 99 mg/dL 92  106  96   BUN 8 - 23 mg/dL 23  33  32   Creatinine 0.44 - 1.00 mg/dL 1.03  1.29  1.03   Sodium 135 - 145 mmol/L 139  140  139   Potassium 3.5 - 5.1 mmol/L 4.1  4.1  4.3   Chloride 98 - 111 mmol/L 103  104  105   CO2 22 - 32 mmol/L '29  28  28   '$ Calcium 8.9 - 10.3 mg/dL 9.5  9.6  9.4   Total Protein 6.5 - 8.1 g/dL 6.9  7.0  6.5   Total Bilirubin 0.3 - 1.2 mg/dL 0.8  1.0  0.7   Alkaline Phos 38 - 126 U/L 56  53  52   AST 15 - 41 U/L 31  43  27   ALT 0 - 44 U/L 20  34  18

## 2022-09-17 IMAGING — CT CT CERVICAL SPINE W/O CM
3 of 4 series · 11 of 33 positions shown, 13 images · non-contrast
Comparison: CT cervical spine and CT head from January 01, 2021.

CLINICAL DATA: Head trauma, minor (Age >= 65y); Neck pain, acute,
no red flags



[Series 6: sagittal bone · sagittal · 0.26mm/px · 5 of 43 slices shown, 6 images]
[im 15/43  bone]
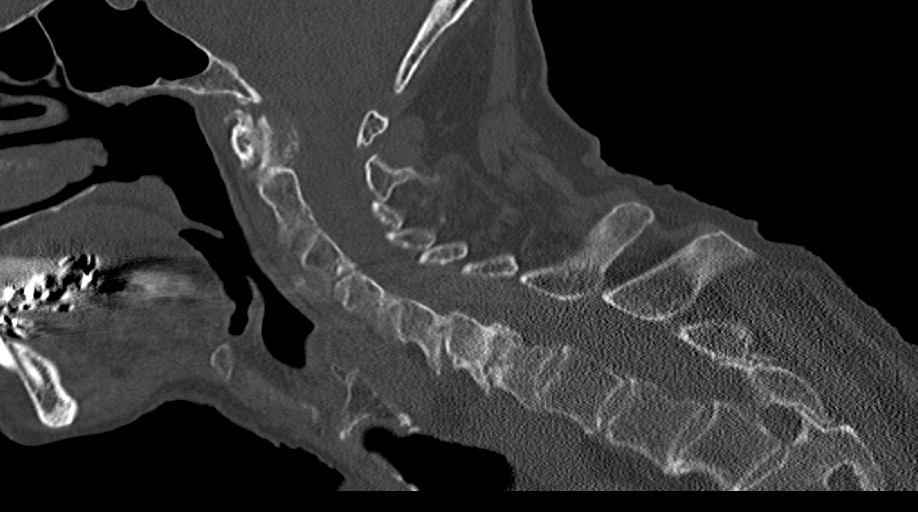
[im 18/43  bone]
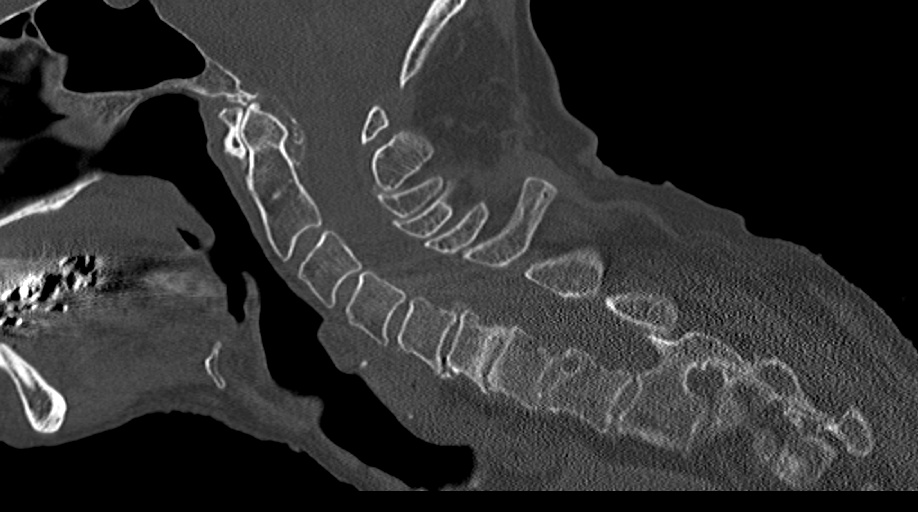
[im 22/43  soft-tissue]
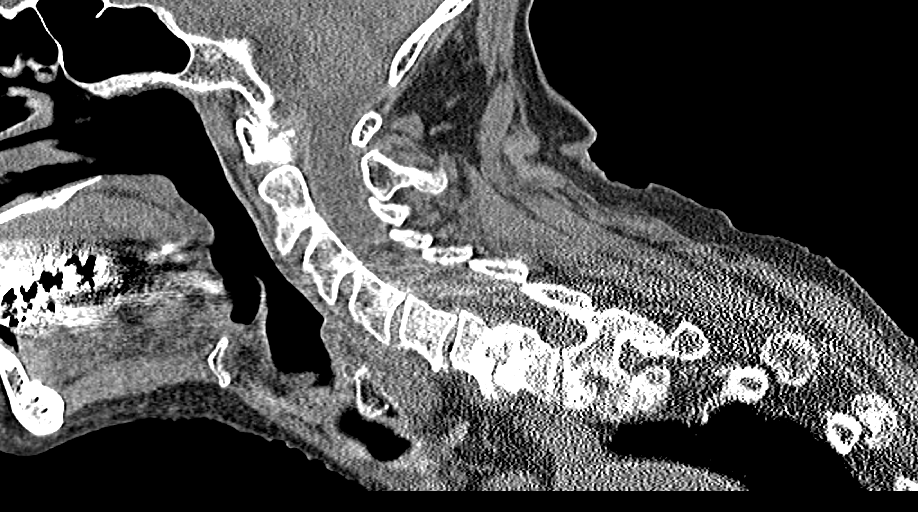
[im 22/43  bone]
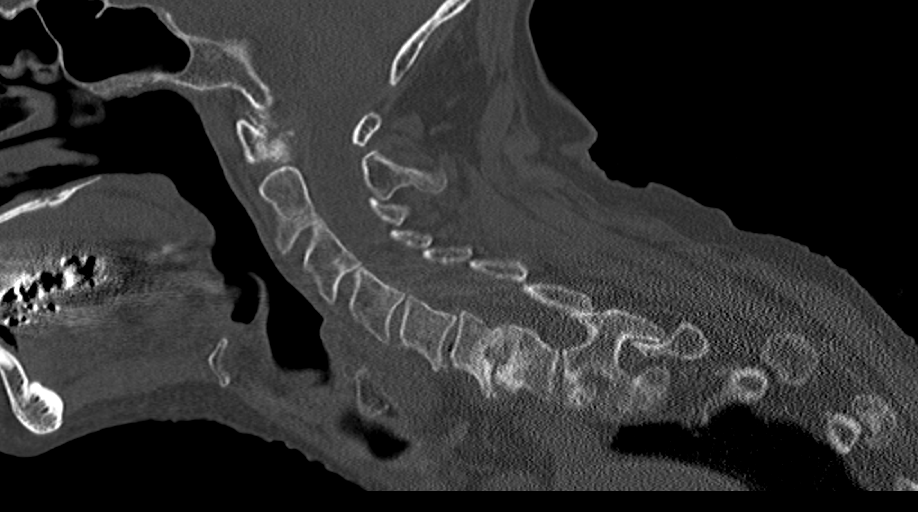
[im 25/43  bone]
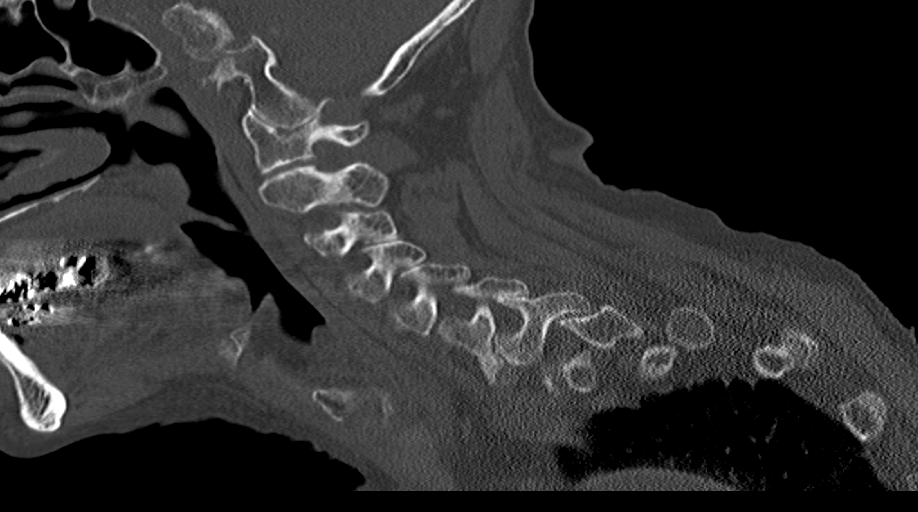
[im 29/43  bone]
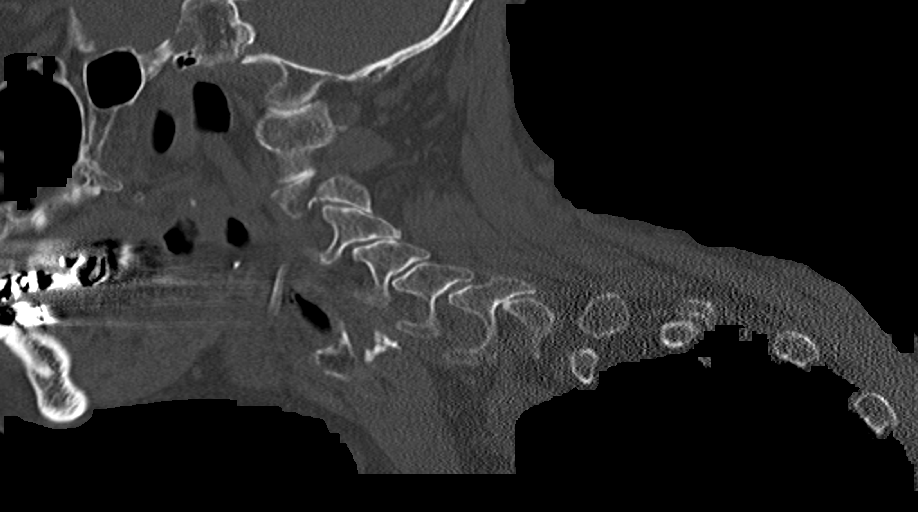

[Series 7: coronal bone · coronal · 0.26mm/px · 3 of 105 slices shown]
[im 21/105  bone]
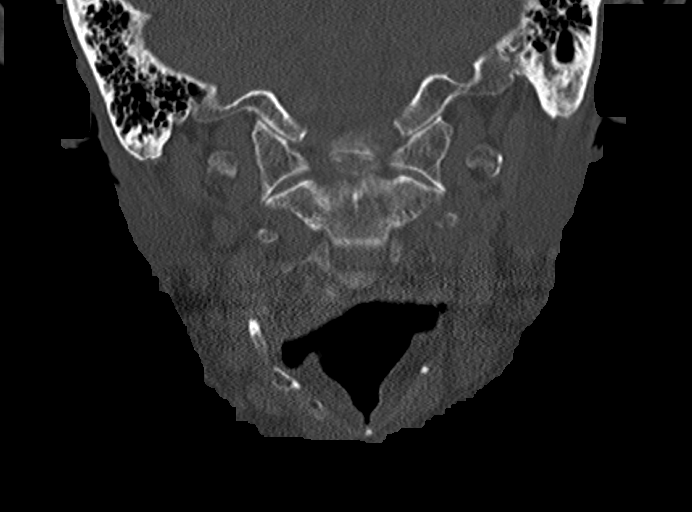
[im 42/105  bone]
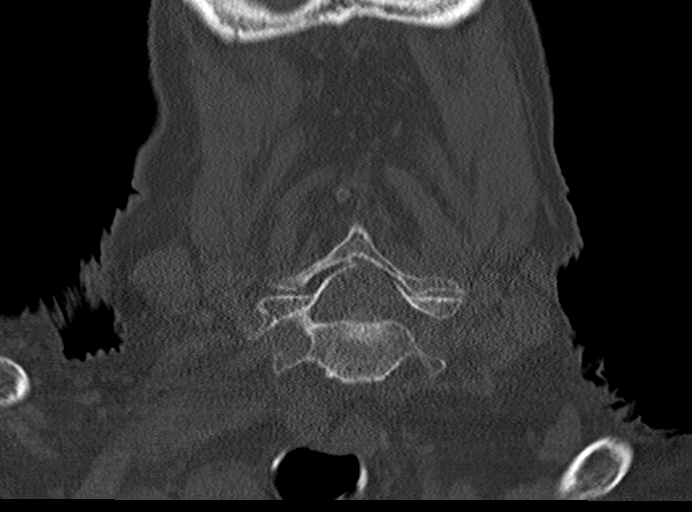
[im 63/105  bone]
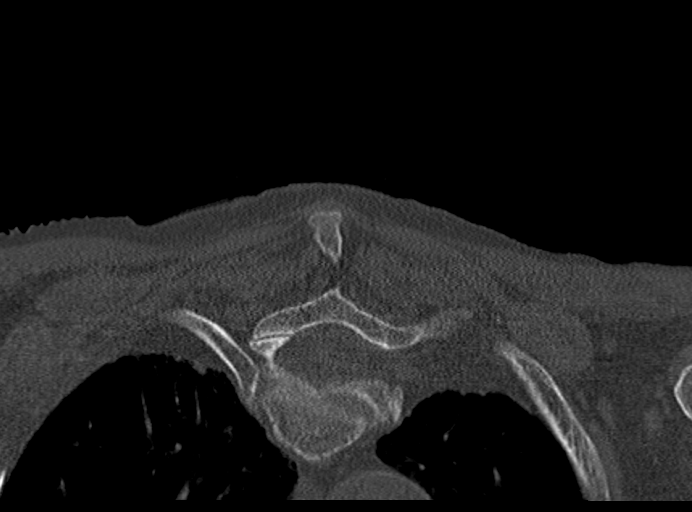

[Series 8: orthogonal bone · axial · 0.35mm/px · z∈[-268,-178]mm · 3 of 71 slices shown, 4 images]
[im 12/71  soft-tissue]
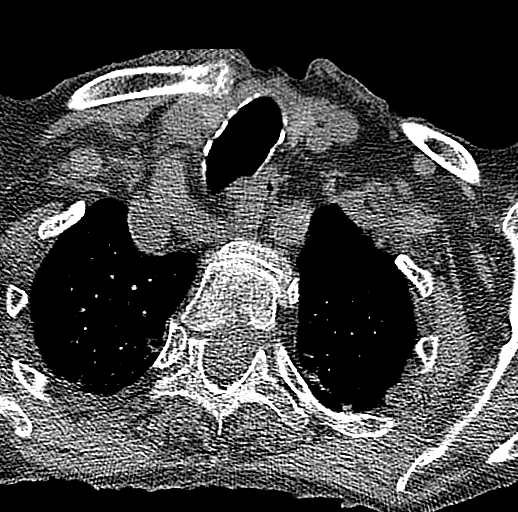
[im 12/71  bone]
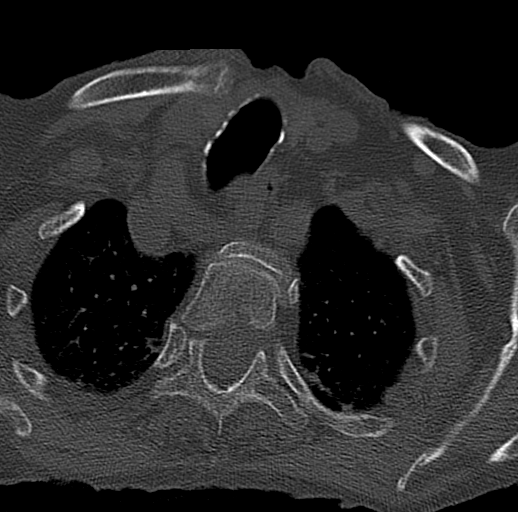
[im 36/71  bone]
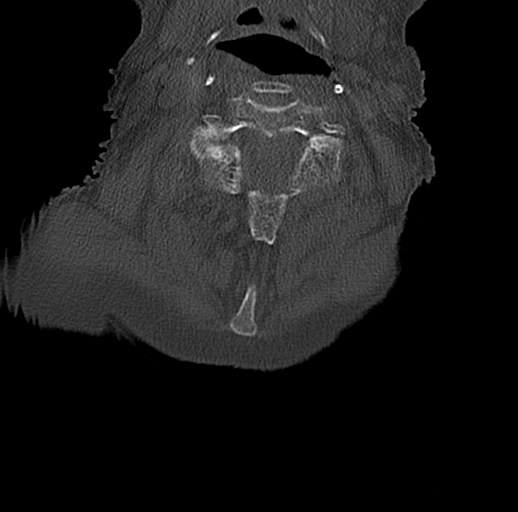
[im 59/71  bone]
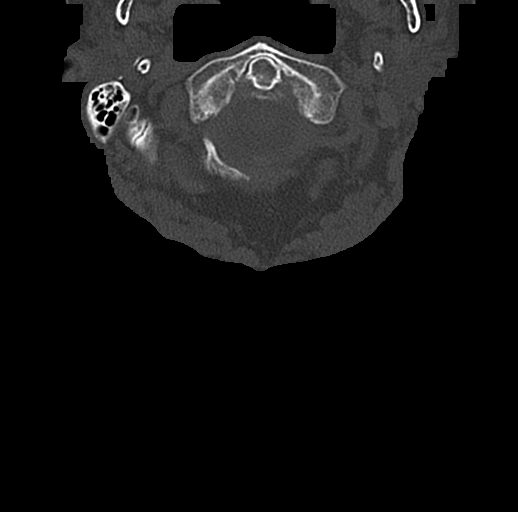

[11 of 33 positions shown; findings below may reference images not displayed]

FINDINGS: CT HEAD FINDINGS

Brain: No evidence of acute infarction, hemorrhage, hydrocephalus,
extra-axial collection or mass lesion/mass effect. Chronic
microvascular ischemic disease and cerebral atrophy, similar.

Vascular: Calcific atherosclerosis. No hyperdense vessel identified.

Skull: High right frontal scalp contusion.  No acute fracture.

Sinuses/Orbits: Visualized sinuses are clear. No acute orbital
findings.

Other: No mastoid effusions.

CT CERVICAL SPINE FINDINGS

Alignment: Approximately 2 mm of anterolisthesis of C5 on C6 and
C7-T1, similar to the prior.

Skull base and vertebrae: No evidence of acute fracture. Osteopenia.

Soft tissues and spinal canal: No prevertebral fluid or swelling. No
visible canal hematoma.

Disc levels: Severe degenerative disease at C6-C7, including disc
height loss, endplate sclerosis/irregularity and endplate spurring.
Craniocervical degenerative change.

Upper chest: Biapical pleuroparenchymal scarring. Otherwise,
visualized lung apices are clear.
IMPRESSION: CT head:

1. No evidence of acute intracranial abnormality.
2. High right frontal scalp contusion without acute calvarial
fracture.
3. Similar chronic microvascular ischemic disease and cerebral
atrophy ([id number]-4GN.G).

CT cervical spine:

1. No evidence of acute fracture or traumatic malalignment.
2. Severe degenerative disease at C6-C7.

## 2022-09-17 IMAGING — CT CT HEAD W/O CM
4 series · 15 of 47 positions shown, 17 images · non-contrast
Comparison: CT cervical spine and CT head from January 01, 2021.

CLINICAL DATA: Head trauma, minor (Age >= 65y); Neck pain, acute,
no red flags



[Series 2: head bone · axial · 0.39mm/px · z∈[-139,-125]mm · 2 of 69 slices shown]
[im 7/69  bone]
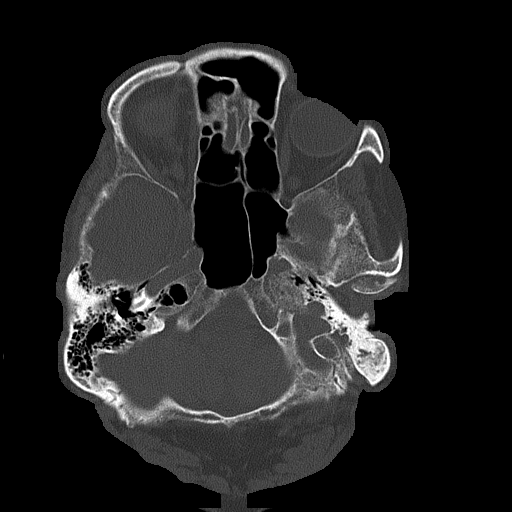
[im 14/69  bone]
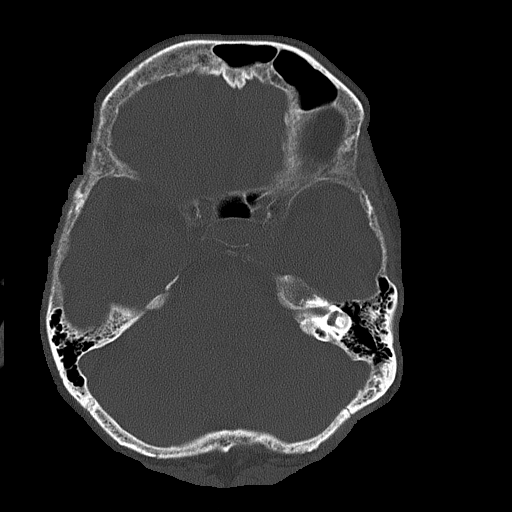

[Series 3: head wo · axial · 0.39mm/px · z∈[-136,-36]mm · 7 of 28 slices shown, 9 images]
[im 4/28  brain]
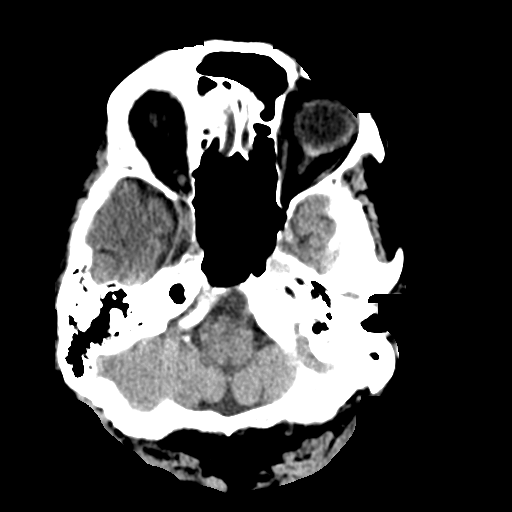
[im 4/28  bone]
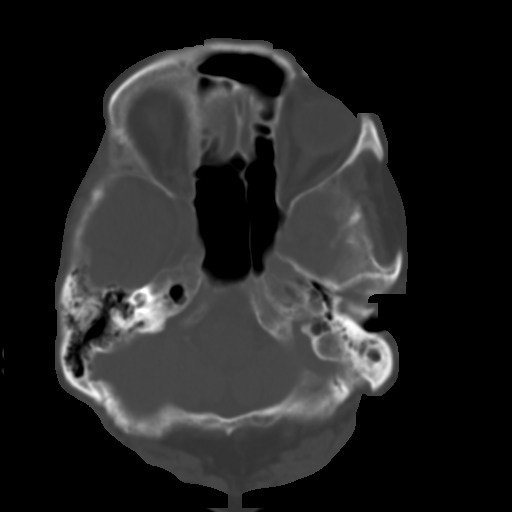
[im 7/28  brain]
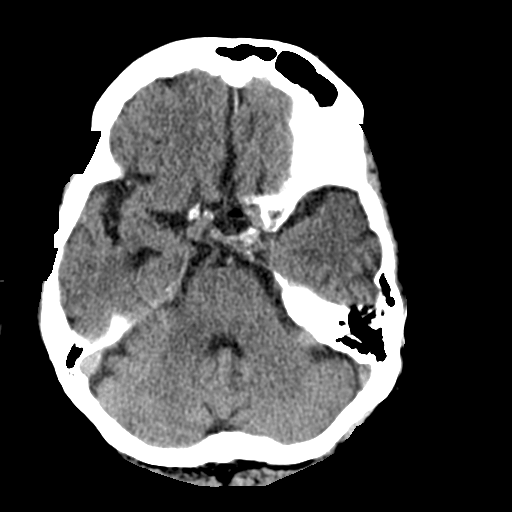
[im 11/28  brain]
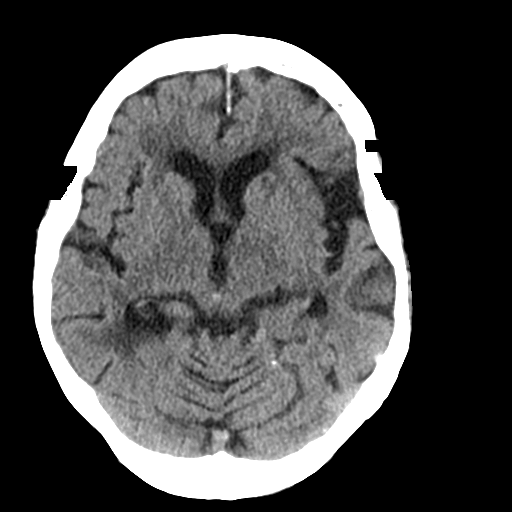
[im 14/28  brain]
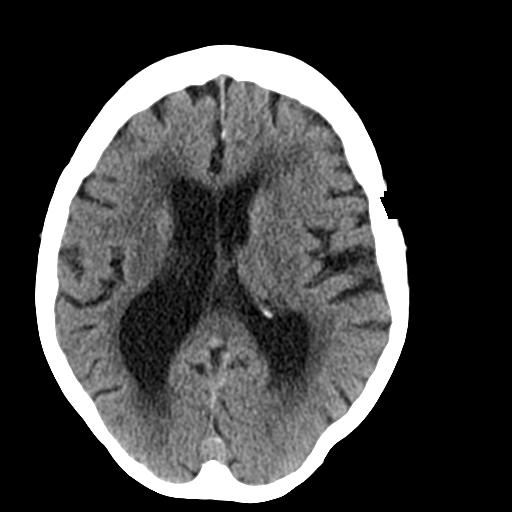
[im 17/28  brain]
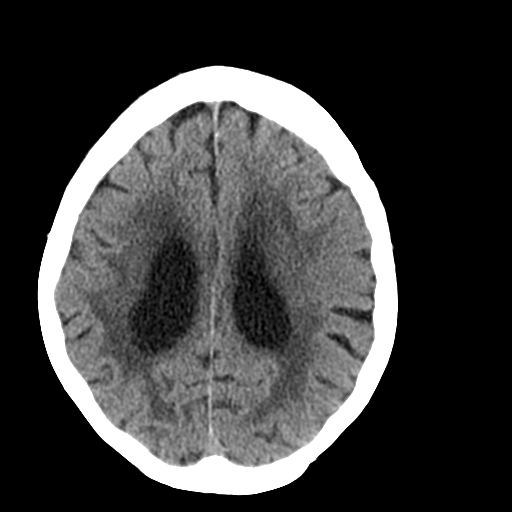
[im 17/28  bone]
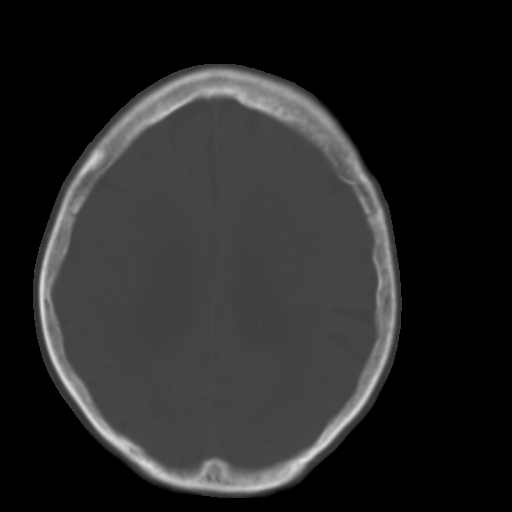
[im 21/28  brain]
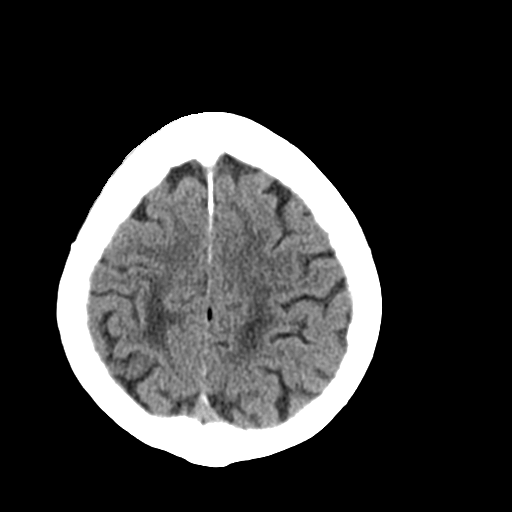
[im 24/28  brain]
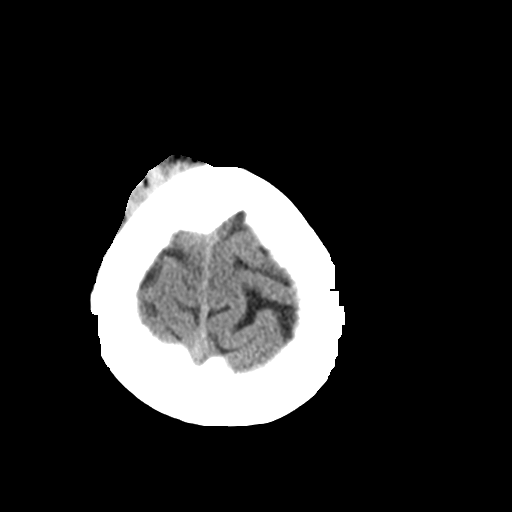

[Series 4: coronal soft tissue · coronal · 0.29mm/px · 3 of 63 slices shown]
[im 21/63  brain]
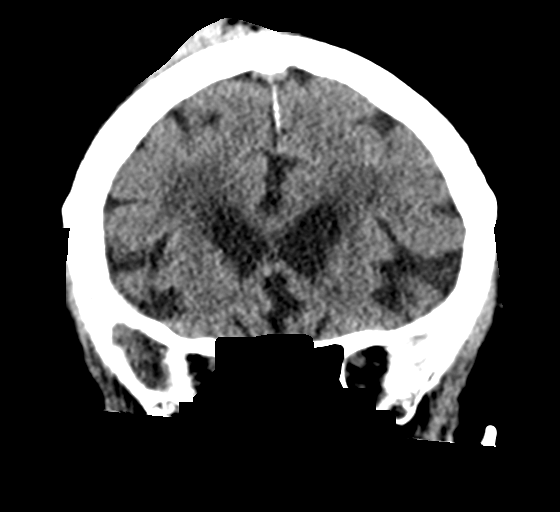
[im 28/63  brain]
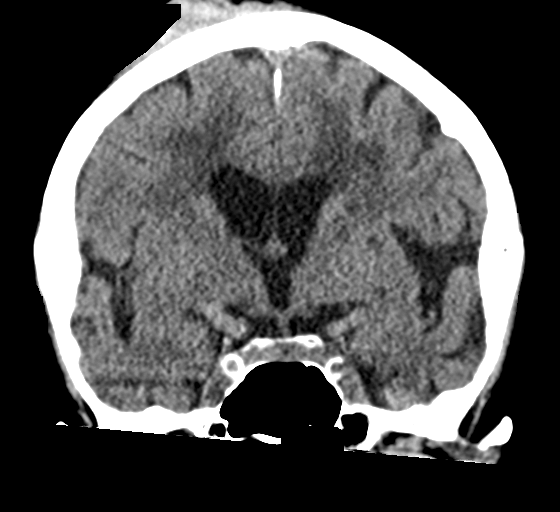
[im 35/63  brain]
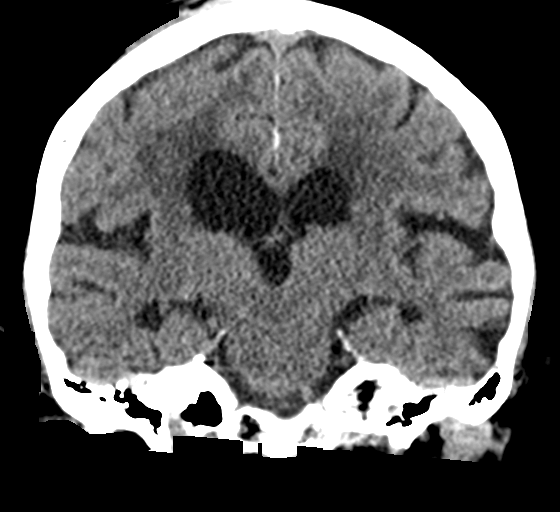

[Series 5: sagittal soft tissue · sagittal · 0.29mm/px · 3 of 53 slices shown]
[im 18/53  brain]
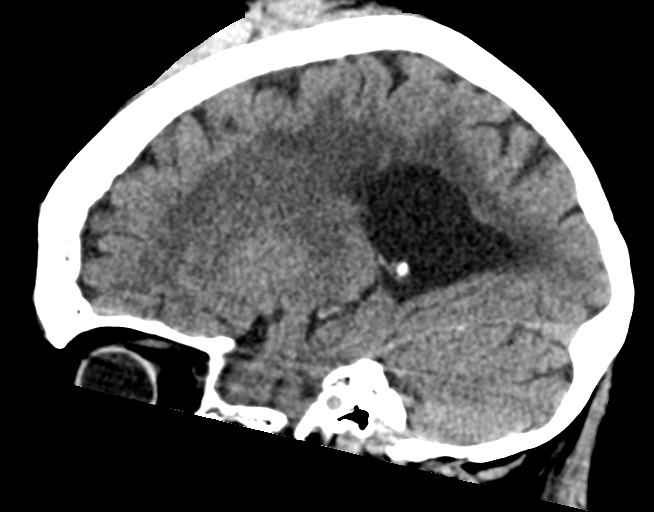
[im 27/53  brain]
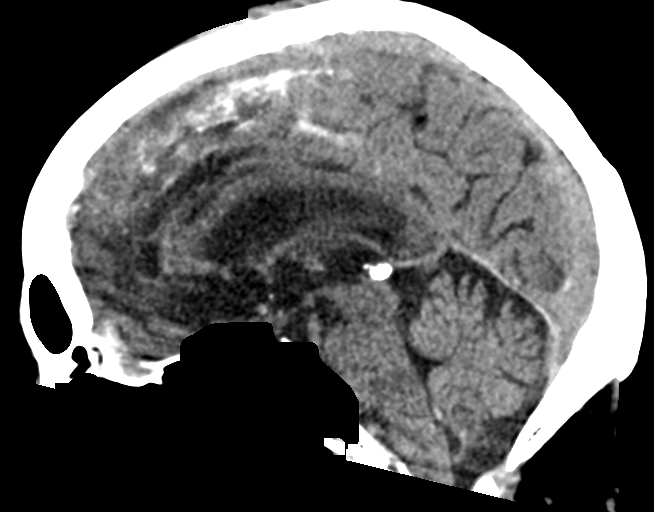
[im 35/53  brain]
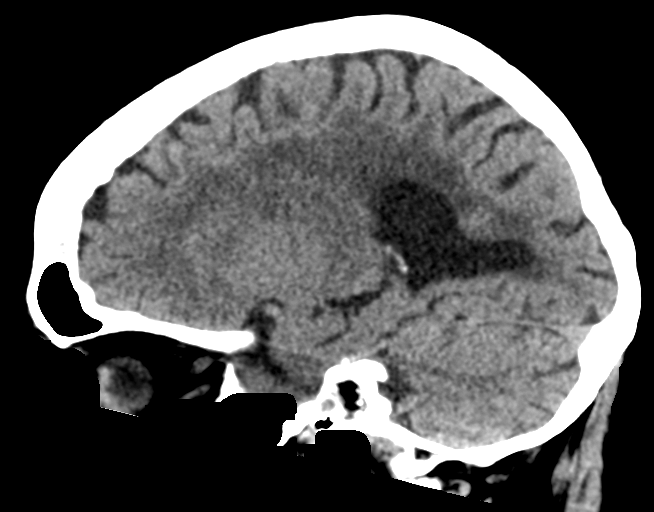

[15 of 47 positions shown; findings below may reference images not displayed]

FINDINGS: CT HEAD FINDINGS

Brain: No evidence of acute infarction, hemorrhage, hydrocephalus,
extra-axial collection or mass lesion/mass effect. Chronic
microvascular ischemic disease and cerebral atrophy, similar.

Vascular: Calcific atherosclerosis. No hyperdense vessel identified.

Skull: High right frontal scalp contusion.  No acute fracture.

Sinuses/Orbits: Visualized sinuses are clear. No acute orbital
findings.

Other: No mastoid effusions.

CT CERVICAL SPINE FINDINGS

Alignment: Approximately 2 mm of anterolisthesis of C5 on C6 and
C7-T1, similar to the prior.

Skull base and vertebrae: No evidence of acute fracture. Osteopenia.

Soft tissues and spinal canal: No prevertebral fluid or swelling. No
visible canal hematoma.

Disc levels: Severe degenerative disease at C6-C7, including disc
height loss, endplate sclerosis/irregularity and endplate spurring.
Craniocervical degenerative change.

Upper chest: Biapical pleuroparenchymal scarring. Otherwise,
visualized lung apices are clear.
IMPRESSION: CT head:

1. No evidence of acute intracranial abnormality.
2. High right frontal scalp contusion without acute calvarial
fracture.
3. Similar chronic microvascular ischemic disease and cerebral
atrophy ([id number]-4GN.G).

CT cervical spine:

1. No evidence of acute fracture or traumatic malalignment.
2. Severe degenerative disease at C6-C7.

## 2023-01-07 ENCOUNTER — Emergency Department: Payer: Medicare Other

## 2023-01-07 ENCOUNTER — Emergency Department
Admission: EM | Admit: 2023-01-07 | Discharge: 2023-01-07 | Disposition: A | Discharge status: Home / Self Care | Payer: Medicare Other | Attending: Emergency Medicine | Admitting: Emergency Medicine

## 2023-01-07 ENCOUNTER — Other Ambulatory Visit: Payer: Self-pay

## 2023-01-07 DIAGNOSIS — R030 Elevated blood-pressure reading, without diagnosis of hypertension: Secondary | ICD-10-CM | POA: Diagnosis present

## 2023-01-07 DIAGNOSIS — R7989 Other specified abnormal findings of blood chemistry: Secondary | ICD-10-CM | POA: Insufficient documentation

## 2023-01-07 DIAGNOSIS — E039 Hypothyroidism, unspecified: Secondary | ICD-10-CM | POA: Diagnosis not present

## 2023-01-07 DIAGNOSIS — I10 Essential (primary) hypertension: Secondary | ICD-10-CM

## 2023-01-07 DIAGNOSIS — I129 Hypertensive chronic kidney disease with stage 1 through stage 4 chronic kidney disease, or unspecified chronic kidney disease: Secondary | ICD-10-CM | POA: Diagnosis not present

## 2023-01-07 DIAGNOSIS — R609 Edema, unspecified: Secondary | ICD-10-CM

## 2023-01-07 DIAGNOSIS — R600 Localized edema: Secondary | ICD-10-CM | POA: Diagnosis not present

## 2023-01-07 DIAGNOSIS — Z85828 Personal history of other malignant neoplasm of skin: Secondary | ICD-10-CM | POA: Diagnosis not present

## 2023-01-07 DIAGNOSIS — N1832 Chronic kidney disease, stage 3b: Secondary | ICD-10-CM | POA: Diagnosis not present

## 2023-01-07 LAB — TROPONIN I (HIGH SENSITIVITY)
Troponin I (High Sensitivity): 9 ng/L (ref <18)
Troponin I (High Sensitivity): 13 ng/L (ref <18)

## 2023-01-07 LAB — BASIC METABOLIC PANEL
Anion gap: 10 (ref 5–15)
BUN: 39 mg/dL — ABNORMAL HIGH (ref 8–23)
CO2: 25 mmol/L (ref 22–32)
Calcium: 9.7 mg/dL (ref 8.9–10.3)
Chloride: 103 mmol/L (ref 98–111)
Creatinine, Ser: 1.3 mg/dL — ABNORMAL HIGH (ref 0.44–1.00)
GFR, Estimated: 39 mL/min — ABNORMAL LOW (ref >60)
Glucose, Bld: 89 mg/dL (ref 70–99)
Potassium: 4.1 mmol/L (ref 3.5–5.1)
Sodium: 138 mmol/L (ref 135–145)

## 2023-01-07 LAB — CBC
HCT: 44 % (ref 36.0–46.0)
Hemoglobin: 14.6 g/dL (ref 12.0–15.0)
MCH: 29.3 pg (ref 26.0–34.0)
MCHC: 33.2 g/dL (ref 30.0–36.0)
MCV: 88.4 fL (ref 80.0–100.0)
Platelets: 290 10*3/uL (ref 150–400)
RBC: 4.98 MIL/uL (ref 3.87–5.11)
RDW: 14.5 % (ref 11.5–15.5)
WBC: 10.1 10*3/uL (ref 4.0–10.5)
nRBC: 0 % (ref 0.0–0.2)

## 2023-01-07 LAB — BRAIN NATRIURETIC PEPTIDE: B Natriuretic Peptide: 291 pg/mL — ABNORMAL HIGH (ref 0.0–100.0)

## 2023-01-07 MED ORDER — ENALAPRILAT 1.25 MG/ML IV SOLN
0.6250 mg | Freq: Once | INTRAVENOUS | Status: AC
  Administered 2023-01-07: 0.625 mg via INTRAVENOUS
  Filled 2023-01-07: qty 2

## 2023-01-07 MED ORDER — FUROSEMIDE 10 MG/ML IJ SOLN
40.0000 mg | Freq: Once | INTRAMUSCULAR | Status: AC
  Administered 2023-01-07: 40 mg via INTRAVENOUS
  Filled 2023-01-07: qty 4

## 2023-01-07 MED ORDER — LISINOPRIL 10 MG PO TABS: 10.0000 mg | ORAL_TABLET | Freq: Every day | ORAL | 11 refills | Status: DC

## 2023-01-07 NOTE — Discharge Instructions (Addendum)
In addition to your metoprolol please start taking lisinopril 10 mg daily.  Please start taking the Lasix daily.  It is important that you follow-up with your cardiologist.  Please call Monday to schedule an appointment.  You should also be receiving a phone call to see the heart failure doctors.  If you do not hear from them please call the number above.

## 2023-01-07 NOTE — ED Triage Notes (Addendum)
Pt to ED via POV from home. Pt reports BP was checked this afternoon by home health nurse and was high. Pt reports BP has been running high all week (unsure of how high). Pt BP in triage 201/97. Pt reports currently on BP medications. Pt denies HA, vision changes, dizziness. Pt endorses SOB with exertion. Pt recently started on fluid pill for fluid retention.

## 2023-01-07 NOTE — ED Notes (Signed)
See triage note. Pt came in due to high BP all week long. Pt started on fluid pill this week. Pt denies any pain, SOB, headache, dizziness or vision changes.

## 2023-01-07 NOTE — ED Notes (Signed)
Pt incontinent of urine due to movement of purewick. Pt cleaned up with assistance of NT new sheets, gown, and purewick.

## 2023-01-07 NOTE — ED Provider Notes (Addendum)
[facility] Provider Note    Event Date/Time   First MD Initiated Contact with Patient 01/07/23 1655     (approximate)   History   Hypertension   HPI  Olivia Lewis is a 89 y.o. female past medical history of paroxysmal A-fib, hypertension, hyperlipidemia, CKD, GERD, osteoporosis who presents because of elevated blood pressure.  Patient tells me that the home health nurses come 3 times this week and blood pressure has been elevated every time.  She has seen her doctor several times in the last several weeks and typically it is well-controlled in the [id number] systolic range.  She does note increasing right lower extremity swelling.  Has had wound on her left lower extremity and has been getting home health care for this and it is being wrapped.  She did have some mild dyspnea on exertion with walking to the hospital today but otherwise denies shortness of breath denies chest pain.  She takes metoprolol 75 mg XL once daily for blood pressure.  Patient was seen on 2/9 by her primary doctor for bilateral lower extremity edema.  There is suspicion for mild CHF.  Was prescribed daily legs as as needed.   Patient denies headache, visual change, numbness tingling weakness, abdominal pain or new back pain.  Past Medical History:  Diagnosis Date   Allergy    Anemia    Arthritis    B12 deficiency    Basal cell carcinoma    face, R arm, txted by Dr. Koleen Nimrod and [facility])    skin--face and arms   Cataracts, bilateral    Chickenpox    CKD (chronic kidney disease), stage III ([place])    Diverticulosis    GERD (gastroesophageal reflux disease)    Hyperlipidemia    Hypertension    Hypothyroidism    Osteoporosis    Squamous cell carcinoma of skin 05/01/2021   right distal tricep at elbow, left forearm near elbow, right dorsum hand, left mid lat forearm - all treated with EDC   Squamous cell carcinoma of skin 05/01/2021   right calf - EDC 07/14/21   Squamous  cell carcinoma of skin 08/18/2021   R sup calf - ED&C   Squamous cell carcinoma of skin 12/29/2021   L mandible - ED&C   Squamous cell carcinoma of skin 12/29/2021   L lat elbow - ED&C   Squamous cell carcinoma of skin 12/29/2021   L lat tricep - ED&C   Squamous cell carcinoma of skin 05/11/2022   Right deltoid - EDC   Squamous cell carcinoma of skin 05/11/2022   Left forearm - in situ - [facility]    Patient Active Problem List   Diagnosis Date Noted   Pain due to onychomycosis of toenails of both feet 12/10/2021   Osteoarthritis (arthritis due to wear and tear of joints) 08/13/2021   Hyperlipidemia 08/13/2021   Elevated troponin 04/08/2021   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   Paroxysmal atrial fibrillation ([place]) 10/21/2020   Fall    Leukocytosis    Acute kidney injury superimposed on CKD ([place])    Atrial fibrillation ([place]) 10/11/2020   Atrial fibrillation with rapid ventricular response ([place]) 10/10/2020   B12 deficiency 04/07/2016   Acid reflux 04/07/2016   HLD (hyperlipidemia) 04/07/2016   Essential hypertension 04/07/2016   Arthritis, degenerative 04/07/2016   OP (osteoporosis) 04/07/2016   Primary osteoarthritis of knee 05/06/2015   Hypothyroidism 02/25/2015   CKD (chronic kidney disease), stage IIIb  02/25/2015   Calcium blood increased 02/25/2015   Hypercalcemia 02/25/2015   CKD (chronic kidney disease) stage 3, GFR 30-59 ml/min (HCC) 02/25/2015   Baker's cyst of knee 05/30/2014     Physical Exam  Triage Vital Signs: ED Triage Vitals  Enc Vitals Group     BP 01/07/23 1647 (!) 201/97     Pulse Rate 01/07/23 1639 66     Resp 01/07/23 1639 18     Temp 01/07/23 1647 97.9 F (36.6 C)     Temp Source 01/07/23 1647 Oral     SpO2 01/07/23 1647 98 %     Weight --      Height --      Head Circumference --      Peak Flow --      Pain Score 01/07/23 1650 0     Pain Loc --      Pain Edu? --      Excl. in [place]? --     Most recent  vital signs: Vitals:   01/07/23 2015 01/07/23 2100  BP: (!) 194/82   Pulse: (!) 57   Resp: 20   Temp:  98 F (36.7 C)  SpO2: 97%      General: Awake, no distress.  CV:  Good peripheral perfusion.  Resp:  Normal effort.  Lungs are clear no increased work of breathing Abd:  No distention.  Neuro:             Awake, Alert, Oriented x 3  Other:  2+ pitting edema in the right lower extremity, left lower extremity has Unna boot in place, not taken down   ED Results / Procedures / Treatments  Labs (all labs ordered are listed, but only abnormal results are displayed) Labs Reviewed  BASIC METABOLIC PANEL - Abnormal; Notable for the following components:      Result Value   BUN 39 (*)    Creatinine, Ser 1.30 (*)    GFR, Estimated 39 (*)    All other components within normal limits  BRAIN NATRIURETIC PEPTIDE - Abnormal; Notable for the following components:   B Natriuretic Peptide 291.0 (*)    All other components within normal limits  CBC  TROPONIN I (HIGH SENSITIVITY)  TROPONIN I (HIGH SENSITIVITY)     EKG  EKG reviewed interpreted myself shows sinus rhythm with left axis deviation ST depression 1 aVL   RADIOLOGY I reviewed interpreted patient's chest x-ray which is negative for pulmonary edema or infiltrate  PROCEDURES:  Critical Care performed: No  Procedures  The patient is on the cardiac monitor to evaluate for evidence of arrhythmia and/or significant heart rate changes.   MEDICATIONS ORDERED IN ED: Medications  furosemide (LASIX) injection 40 mg (40 mg Intravenous Given 01/07/23 1758)  enalaprilat (VASOTEC) injection 0.625 mg (0.625 mg Intravenous Given 01/07/23 1910)     IMPRESSION / MDM / ASSESSMENT AND PLAN / ED COURSE  I reviewed the triage vital signs and the nursing notes.                              Patient's presentation is most consistent with acute presentation with potential threat to life or bodily function.  Differential diagnosis  includes, but is not limited to, CHF, renal failure, pulmonary hypertension, asymptomatic hypertension  The patient is a 89 year old female with underlying hypertension on metoprolol who presents because of elevated blood pressure.  Has been elevated all week when her home  health nurse has checked it.  It is 190/100.  Patient did have some dyspnea on exertion when walking back to the ED but denies this currently denies chest pain.  Her only complaint is right lower extremity swelling.  Notably she has a left Unna boot in place due to wound in the left lower extremity so difficult to tell whether this edema is asymmetric or not.  I see that on 2/9 patient saw her primary doctor that she had an [place] in place at that time, suspect this likely would be bilateral if Unna boot were not in place.  Patient did have some dyspnea on exertion but is not have crackles on exam satting well and her chest x-ray is clear.  This could be right-sided heart failure.  Plan to obtain labs to evaluate her renal function.  Will give a dose of IV Lasix.  Labs overall reassuring.  BNP is mildly elevated around 200.  Troponin is negative.  Renal function is near baseline at 1.3.  Patient was given dose of Lasix in the ED as well as a dose of IV enalaprilat.  Repeat troponin is negative.  Will start on a low-dose of lisinopril.  Refer patient to cardiology and CHF clinic as will need echo.  Given she is not acutely short of breath now she is not hypoxic do not feel that she requires admission for aggressive blood pressure lowering I feel that this would best be lowered over the next several days to weeks.  Did discuss with her that if her dyspnea worsen or she develop chest pain that she should return to the ED.     FINAL CLINICAL IMPRESSION(S) / ED DIAGNOSES   Final diagnoses:  Edema, unspecified type  Hypertension, unspecified type     Rx / DC Orders   ED Discharge Orders          Ordered    Ambulatory referral to  Cardiology       Comments: If you have not heard from the Cardiology office within the next 72 hours please call [phone].   01/07/23 2127    AMB referral to CHF clinic        01/07/23 2127    lisinopril (ZESTRIL) 10 MG tablet  Daily        01/07/23 2127             Note:  This document was prepared using Dragon voice recognition software and may include unintentional dictation errors.   Rada Hay, MD 01/07/23 2102    Rada Hay, MD 01/07/23 2127

## 2023-01-08 ENCOUNTER — Emergency Department
Admission: EM | Admit: 2023-01-08 | Discharge: 2023-01-08 | Disposition: A | Discharge status: Home / Self Care | Payer: Medicare Other | Attending: Emergency Medicine | Admitting: Emergency Medicine

## 2023-01-08 ENCOUNTER — Other Ambulatory Visit: Payer: Self-pay

## 2023-01-08 ENCOUNTER — Emergency Department: Payer: Medicare Other

## 2023-01-08 DIAGNOSIS — S81811A Laceration without foreign body, right lower leg, initial encounter: Secondary | ICD-10-CM | POA: Insufficient documentation

## 2023-01-08 DIAGNOSIS — S8991XA Unspecified injury of right lower leg, initial encounter: Secondary | ICD-10-CM | POA: Diagnosis present

## 2023-01-08 DIAGNOSIS — R55 Syncope and collapse: Secondary | ICD-10-CM | POA: Diagnosis not present

## 2023-01-08 DIAGNOSIS — W228XXA Striking against or struck by other objects, initial encounter: Secondary | ICD-10-CM | POA: Diagnosis not present

## 2023-01-08 DIAGNOSIS — I10 Essential (primary) hypertension: Secondary | ICD-10-CM | POA: Diagnosis not present

## 2023-01-08 DIAGNOSIS — W19XXXA Unspecified fall, initial encounter: Secondary | ICD-10-CM

## 2023-01-08 LAB — COMPREHENSIVE METABOLIC PANEL
ALT: 17 U/L (ref 0–44)
AST: 32 U/L (ref 15–41)
Albumin: 4.1 g/dL (ref 3.5–5.0)
Alkaline Phosphatase: 69 U/L (ref 38–126)
Anion gap: 12 (ref 5–15)
BUN: 46 mg/dL — ABNORMAL HIGH (ref 8–23)
CO2: 30 mmol/L (ref 22–32)
Calcium: 9.9 mg/dL (ref 8.9–10.3)
Chloride: 99 mmol/L (ref 98–111)
Creatinine, Ser: 1.39 mg/dL — ABNORMAL HIGH (ref 0.44–1.00)
GFR, Estimated: 36 mL/min — ABNORMAL LOW (ref >60)
Glucose, Bld: 112 mg/dL — ABNORMAL HIGH (ref 70–99)
Potassium: 4.1 mmol/L (ref 3.5–5.1)
Sodium: 141 mmol/L (ref 135–145)
Total Bilirubin: 1 mg/dL (ref 0.3–1.2)
Total Protein: 7 g/dL (ref 6.5–8.1)

## 2023-01-08 LAB — URINALYSIS, ROUTINE W REFLEX MICROSCOPIC
Bilirubin Urine: NEGATIVE
Glucose, UA: NEGATIVE mg/dL
Hgb urine dipstick: NEGATIVE
Ketones, ur: NEGATIVE mg/dL
Leukocytes,Ua: NEGATIVE
Nitrite: POSITIVE — AB
Protein, ur: 30 mg/dL — AB
Specific Gravity, Urine: 1.023 (ref 1.005–1.030)
pH: 5 (ref 5.0–8.0)

## 2023-01-08 LAB — CBC
HCT: 44.3 % (ref 36.0–46.0)
Hemoglobin: 14.9 g/dL (ref 12.0–15.0)
MCH: 29.6 pg (ref 26.0–34.0)
MCHC: 33.6 g/dL (ref 30.0–36.0)
MCV: 87.9 fL (ref 80.0–100.0)
Platelets: 295 10*3/uL (ref 150–400)
RBC: 5.04 MIL/uL (ref 3.87–5.11)
RDW: 14.5 % (ref 11.5–15.5)
WBC: 11.9 10*3/uL — ABNORMAL HIGH (ref 4.0–10.5)
nRBC: 0 % (ref 0.0–0.2)

## 2023-01-08 LAB — LACTIC ACID, PLASMA: Lactic Acid, Venous: 1.4 mmol/L (ref 0.5–1.9)

## 2023-01-08 LAB — TROPONIN I (HIGH SENSITIVITY)
Troponin I (High Sensitivity): 17 ng/L (ref <18)
Troponin I (High Sensitivity): 20 ng/L — ABNORMAL HIGH (ref <18)

## 2023-01-08 MED ORDER — NYSTATIN-TRIAMCINOLONE 100000-0.1 UNIT/GM-% EX OINT: 1.0000 | TOPICAL_OINTMENT | Freq: Two times a day (BID) | CUTANEOUS | 0 refills | Status: DC

## 2023-01-08 MED ORDER — LIDOCAINE HCL (PF) 1 % IJ SOLN
5.0000 mL | Freq: Once | INTRAMUSCULAR | Status: AC
  Administered 2023-01-08: 5 mL
  Filled 2023-01-08: qty 5

## 2023-01-08 MED ORDER — SODIUM CHLORIDE 0.9 % IV BOLUS
500.0000 mL | Freq: Once | INTRAVENOUS | Status: AC
  Administered 2023-01-08: 500 mL via INTRAVENOUS

## 2023-01-08 MED ORDER — NYSTATIN 100000 UNIT/GM EX POWD: 1.0000 | Freq: Three times a day (TID) | CUTANEOUS | 0 refills | Status: DC

## 2023-01-08 NOTE — Discharge Instructions (Signed)
Your laceration on your right leg was repaired with stitches.  You will need to have these removed in about 10 days.  Please elevate the leg and wear compression stockings.

## 2023-01-08 NOTE — ED Notes (Signed)
Pt verbalizes understanding of discharge instructions. Opportunity for questioning and answers were provided. Pt discharged from ED to home with friend. Wound bandaged with telfa and curlix. Pt able to ambulate

## 2023-01-08 NOTE — ED Provider Notes (Signed)
Patient pending urinalysis at the time of signout.  89 year old female had a syncopal episode today.  Otherwise is minimally symptomatic.  Patient is denying urinary symptoms.  Imaging in the ED and labs been reassuring.  Urinalysis is nitrite positive but there are 0-5 white cells many bacteria.  Patient is not having any urinary symptoms.  Given no pyuria on UA without symptoms will not treat.  Patient can be discharged.   Rada Hay, MD 01/08/23 [phone]

## 2023-01-08 NOTE — ED Notes (Signed)
Pt called for ride. ETA of ride 30 minutes

## 2023-01-08 NOTE — ED Provider Notes (Signed)
[facility] Provider Note    Event Date/Time   First MD Initiated Contact with Patient 01/08/23 1106     (approximate)   History   Fall   HPI  Olivia Lewis is a 89 y.o. female past medical history significant for paroxysmal atrial fibrillation not on anticoagulation, hypertension, who presents to the emergency department following a syncopal episode.  Patient states that he she was evaluated in the emergency department yesterday for high blood pressure that was found from a home health nurse that came out to check her blood pressure for a wound on her foot.  She states that the wound on her foot has been healing well.  States that she was in the emergency department all day yesterday receiving medications and did not eat anything.  This morning and not eat anything for breakfast and states that she got her sister ready and then was walking through the kitchen and had an episode where she felt lightheaded and then passed out.  Immediately felt back to her normal but does state that she hit her right leg.  Denies any chest pain or shortness of breath.     Physical Exam   Triage Vital Signs: ED Triage Vitals [01/08/23 1112]  Enc Vitals Group     BP      Pulse      Resp      Temp      Temp src      SpO2      Weight      Height      Head Circumference      Peak Flow      Pain Score 0     Pain Loc      Pain Edu?      Excl. in [place]?     Most recent vital signs: Vitals:   01/08/23 1545 01/08/23 1623  BP:  (!) 137/114  Pulse:  78  Resp:  (!) 25  Temp: 97.8 F (36.6 C)   SpO2:  93%    Physical Exam Constitutional:      Appearance: She is well-developed.  HENT:     Head: Atraumatic.  Eyes:     Conjunctiva/sclera: Conjunctivae normal.  Cardiovascular:     Rate and Rhythm: Rhythm irregular.  Pulmonary:     Effort: No respiratory distress.  Abdominal:     General: There is no distension.     Tenderness: There is no abdominal tenderness.   Musculoskeletal:        General: Normal range of motion.     Cervical back: Normal range of motion.     Right lower leg: No edema.     Left lower leg: No edema.  Skin:    General: Skin is warm.     Comments: 6 cm large laceration to the right lateral calf that is hemostatic  Neurological:     Mental Status: She is alert. Mental status is at baseline.  Psychiatric:        Mood and Affect: Mood normal.     IMPRESSION / MDM / [place] / ED COURSE  I reviewed the triage vital signs and the nursing notes.  Differential diagnosis including syncope, seizure, intracranial hemorrhage, dysrhythmia, urinary tract infection, electrolyte abnormalities  EKG  I, Nathaniel Man, the attending physician, personally viewed and interpreted this ECG.   Rate: Normal  Rhythm: Normal sinus, frequent PACs and ectopy  Axis: Normal  Intervals: Normal  ST&T Change: None PACs  and irregular heart rhythm while on cardiac telemetry.  RADIOLOGY I independently reviewed imaging, my interpretation of imaging: CT scan of the head and neck without signs of acute intracranial hemorrhage or infarction  LABS (all labs ordered are listed, but only abnormal results are displayed) Labs interpreted as -  Initial troponin was negative, repeat troponin mildly elevated at 20 however not a significant increase from the initial troponin, only 4 points which is within lab error.  Creatinine appears to be at baseline.  No significant electrolyte abnormalities.  Labs Reviewed  CBC - Abnormal; Notable for the following components:      Result Value   WBC 11.9 (*)    All other components within normal limits  COMPREHENSIVE METABOLIC PANEL - Abnormal; Notable for the following components:   Glucose, Bld 112 (*)    BUN 46 (*)    Creatinine, Ser 1.39 (*)    GFR, Estimated 36 (*)    All other components within normal limits  TROPONIN I (HIGH SENSITIVITY) - Abnormal; Notable for the following components:    Troponin I (High Sensitivity) 20 (*)    All other components within normal limits  LACTIC ACID, PLASMA  LACTIC ACID, PLASMA  URINALYSIS, ROUTINE W REFLEX MICROSCOPIC  TROPONIN I (HIGH SENSITIVITY)    TREATMENT    MDM  Patient given IV fluids.  Plan to evaluate for possible urinary tract infection.  If patient is able to ambulate without any further episodes of syncope feel that the patient likely could be discharged home.      PROCEDURES:  Critical Care performed: No  ..Laceration Repair  Date/Time: 01/08/2023 4:42 PM  Performed by: Nathaniel Man, MD Authorized by: Nathaniel Man, MD   Consent:    Consent obtained:  Verbal   Consent given by:  Patient   Risks, benefits, and alternatives were discussed: yes     Risks discussed:  Pain, nerve damage, poor wound healing, poor cosmetic result, vascular damage, tendon damage, infection and need for additional repair   Alternatives discussed:  Delayed treatment and no treatment Universal protocol:    Procedure explained and questions answered to patient or proxy's satisfaction: yes     Relevant documents present and verified: yes     Test results available: yes     Imaging studies available: yes     Required blood products, implants, devices, and special equipment available: yes     Patient identity confirmed:  Verbally with patient Anesthesia:    Anesthesia method:  Local infiltration   Local anesthetic:  Lidocaine 1% w/o epi Laceration details:    Location:  Leg   Leg location:  R lower leg   Length (cm):  6 Pre-procedure details:    Preparation:  Patient was prepped and draped in usual sterile fashion Treatment:    Amount of cleaning:  Standard Skin repair:    Repair method:  Sutures   Suture size:  4-0   Suture material:  Prolene   Suture technique:  Horizontal mattress   Number of sutures:  4 Approximation:    Approximation:  Close Repair type:    Repair type:  Complex Post-procedure details:    Dressing:   Sterile dressing   Procedure completion:  Tolerated well, no immediate complications   Patient's presentation is most consistent with acute presentation with potential threat to life or bodily function.   MEDICATIONS ORDERED IN ED: Medications  lidocaine (PF) (XYLOCAINE) 1 % injection 5 mL (5 mLs Other Given 01/08/23 1543)  sodium chloride  0.9 % bolus 500 mL (500 mLs Intravenous New Bag/Given 01/08/23 1619)    FINAL CLINICAL IMPRESSION(S) / ED DIAGNOSES   Final diagnoses:  None     Rx / DC Orders   ED Discharge Orders     None        Note:  This document was prepared using Dragon voice recognition software and may include unintentional dictation errors.   Nathaniel Man, MD 01/08/23 [phone]

## 2023-01-08 NOTE — ED Triage Notes (Signed)
Pt comes in today via [facility] from home with complaints of a fall. Pt was discharged from the hospital last night for hypertension. She was prescribed lisinopril and unable to pick up her prescription yet. Pt states she was walking into her den to have breakfast. While walking she started feeling weak and dizzy which caused her to fall. No loss of consciousness reported. Pt has a laceration to her right lower leg, and complains of right arm pain due to a small skin tear there as well. Pt is alert and oriented x4 and in no obvious signs of distress at this time.  EMS Vitals: Bp: 131/74 Hr: 80 SPO2:94% Blood sugar: 141

## 2023-01-11 NOTE — Progress Notes (Unsigned)
   Patient ID: Olivia Lewis, female    DOB: 02-23-1929, 89 y.o.   MRN: [id number]  HPI  Olivia Lewis is a 89 y/o female with a history of  Echo 10/10/20 showed an EF of 60-65% along with mild MR. Echo 01/15/20 showed an EF of >55% along with mild TR.   Was in the ED 01/08/23 due to syncope with mechanical fall. Was in the ED 01/07/23 due to increased pedal edema and SOB. IV lasix provided.   She presents today for her initial HF clinic visit with a chief complaint of   Review of Systems    Physical Exam    Assessment & Plan:  1: Chronic heart failure wit preserved ejection fraction- - NYHA class - echo has been scheduled for - BNP  01/07/23 was 291.0  2: HTN- - BP - saw PCP (Olivia Lewis) 12/31/22 - BMP 01/08/23 showed sodium 141, potassium 4.1, creatinine 1.39 & GFR 36  3: PAF- - saw cardiology Olivia Lewis) 10/27/23  4: B-cell lymphoma- - saw oncology Olivia Lewis) 09/07/22

## 2023-01-12 ENCOUNTER — Encounter: Payer: Self-pay | Admitting: Pharmacist

## 2023-01-12 ENCOUNTER — Encounter: Payer: Self-pay | Admitting: Family

## 2023-01-12 ENCOUNTER — Ambulatory Visit: Payer: Medicare Other | Attending: Family | Admitting: Family

## 2023-01-12 VITALS — BP 137/67 | HR 52 | Resp 16 | Wt 145.2 lb

## 2023-01-12 DIAGNOSIS — G479 Sleep disorder, unspecified: Secondary | ICD-10-CM | POA: Insufficient documentation

## 2023-01-12 DIAGNOSIS — E785 Hyperlipidemia, unspecified: Secondary | ICD-10-CM | POA: Insufficient documentation

## 2023-01-12 DIAGNOSIS — I10 Essential (primary) hypertension: Secondary | ICD-10-CM

## 2023-01-12 DIAGNOSIS — I130 Hypertensive heart and chronic kidney disease with heart failure and stage 1 through stage 4 chronic kidney disease, or unspecified chronic kidney disease: Secondary | ICD-10-CM | POA: Insufficient documentation

## 2023-01-12 DIAGNOSIS — I5032 Chronic diastolic (congestive) heart failure: Secondary | ICD-10-CM | POA: Diagnosis not present

## 2023-01-12 DIAGNOSIS — D72820 Lymphocytosis (symptomatic): Secondary | ICD-10-CM

## 2023-01-12 DIAGNOSIS — R0602 Shortness of breath: Secondary | ICD-10-CM | POA: Diagnosis not present

## 2023-01-12 DIAGNOSIS — Z79899 Other long term (current) drug therapy: Secondary | ICD-10-CM | POA: Diagnosis not present

## 2023-01-12 DIAGNOSIS — I480 Paroxysmal atrial fibrillation: Secondary | ICD-10-CM

## 2023-01-12 DIAGNOSIS — N183 Chronic kidney disease, stage 3 unspecified: Secondary | ICD-10-CM | POA: Diagnosis not present

## 2023-01-12 DIAGNOSIS — E039 Hypothyroidism, unspecified: Secondary | ICD-10-CM | POA: Diagnosis not present

## 2023-01-12 DIAGNOSIS — D631 Anemia in chronic kidney disease: Secondary | ICD-10-CM | POA: Diagnosis not present

## 2023-01-12 DIAGNOSIS — Z85828 Personal history of other malignant neoplasm of skin: Secondary | ICD-10-CM | POA: Insufficient documentation

## 2023-01-12 DIAGNOSIS — K219 Gastro-esophageal reflux disease without esophagitis: Secondary | ICD-10-CM | POA: Insufficient documentation

## 2023-01-12 DIAGNOSIS — E079 Disorder of thyroid, unspecified: Secondary | ICD-10-CM | POA: Insufficient documentation

## 2023-01-12 MED ORDER — POTASSIUM CHLORIDE CRYS ER 20 MEQ PO TBCR: 20.0000 meq | EXTENDED_RELEASE_TABLET | Freq: Every day | ORAL | 3 refills | Status: DC

## 2023-01-12 NOTE — Patient Instructions (Addendum)
Start weighing daily and call for an overnight weight gain of 3 pounds or more or a weekly weight gain of more than 5 pounds.   If you have voicemail, please make sure your mailbox is cleaned out so that we may leave a message and please make sure to listen to any voicemails.    Start taking potassium as 1 tablet every day

## 2023-01-13 NOTE — Progress Notes (Signed)
Patient ID: Olivia Lewis, female   DOB: Sep 25, 1929, 89 y.o.   MRN: [id number]  [place] - PHARMACIST COUNSELING NOTE  Guideline-Directed Medical Therapy/Evidence Based Medicine  ACE/ARB/ARNI: Lisinopril 10 mg daily Beta Blocker: Metoprolol succinate 75 mg daily Aldosterone Antagonist:  none Diuretic: Furosemide 20 mg daily SGLT2i:  none  Adherence Assessment  Do you ever forget to take your medication? []$ Yes [x]$ No  Do you ever skip doses due to side effects? []$ Yes [x]$ No  Do you have trouble affording your medicines? []$ Yes [x]$ No  Are you ever unable to pick up your medication due to transportation difficulties? []$ Yes [x]$ No  Do you ever stop taking your medications because you don't believe they are helping? []$ Yes [x]$ No  Do you check your weight daily? [x]$ Yes []$ No   Adherence strategy: none (pill box reccommended)   Barriers to obtaining medications: none  Vital signs: HR 52, BP 137/67, weight (pounds) 145 lb  ECHO: pending      Latest Ref Rng & Units 01/08/2023   12:07 PM 01/07/2023    5:25 PM 09/07/2022    1:02 PM  BMP  Glucose 70 - 99 mg/dL 112  89  92   BUN 8 - 23 mg/dL 46  39  23   Creatinine 0.44 - 1.00 mg/dL 1.39  1.30  1.03   Sodium 135 - 145 mmol/L 141  138  139   Potassium 3.5 - 5.1 mmol/L 4.1  4.1  4.1   Chloride 98 - 111 mmol/L 99  103  103   CO2 22 - 32 mmol/L 30  25  29   $ Calcium 8.9 - 10.3 mg/dL 9.9  9.7  9.5     Past Medical History:  Diagnosis Date   Allergy    Anemia    Arthritis    B12 deficiency    Basal cell carcinoma    face, R arm, txted by Dr. Koleen Nimrod and [facility]   Cancer [facility])    skin--face and arms   Cataracts, bilateral    CHF (congestive heart failure) ([place])    Chickenpox    CKD (chronic kidney disease), stage III ([place])    Diverticulosis    GERD (gastroesophageal reflux disease)    Hyperlipidemia    Hypertension    Hypothyroidism    Osteoporosis    Squamous cell carcinoma of  skin 05/01/2021   right distal tricep at elbow, left forearm near elbow, right dorsum hand, left mid lat forearm - all treated with EDC   Squamous cell carcinoma of skin 05/01/2021   right calf - EDC 07/14/21   Squamous cell carcinoma of skin 08/18/2021   R sup calf - ED&C   Squamous cell carcinoma of skin 12/29/2021   L mandible - ED&C   Squamous cell carcinoma of skin 12/29/2021   L lat elbow - ED&C   Squamous cell carcinoma of skin 12/29/2021   L lat tricep - ED&C   Squamous cell carcinoma of skin 05/11/2022   Right deltoid - EDC   Squamous cell carcinoma of skin 05/11/2022   Left forearm - in situ - EDC    ASSESSMENT 89 year old female who presents to the HF clinic for initial visit. PMH includes paroxysmal A-fib, hypertension, hyperlipidemia, CKD, GERD, osteoporosis. Last admission to ED was 01/07/2023 for edema and elevated BP. 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 Denies problems with current therapy and reports compliance with all medication.    Medication  reconciliation was completed. Patient is a good historian and very knowledgeable in relation to her mediation. ECHO is pending but last BNP on 2/16 was 291 with Scr 1.39. Used to take furosemide only PRN, currently on furosemide 27m daily since Saturday. No K supplement prescribed.  PLAN  Diuresis per NP (recommended to continue furosemide 258mfor now, add Kcl 2042mdaily and repeat BMET in 2-3 days Complete ECHO as soon as possible Keep BP and weight log Follow up once ECHO completed to determine best GDMT   Time spent: 15 minutes  Sara Keys Rodriguez-Guzman PharmD, BCPS 01/13/2023 2:13 PM   Current Outpatient Medications:    acetaminophen (TYLENOL) 500 MG tablet, Take 1,000 mg by mouth daily as needed. , Disp: , Rfl:    aspirin EC 81 MG tablet, Take 81 mg by mouth daily. , Disp: , Rfl:    atorvastatin (LIPITOR) 10 MG tablet, Take 1 tablet by mouth daily., Disp: , Rfl:    Calcium  Carb-Cholecalciferol 500-200 MG-UNIT TABS, Take 1 tablet by mouth daily. , Disp: , Rfl:    Cyanocobalamin (B-12 SL), Place 2,500 mcg under the tongue daily. , Disp: , Rfl:    docusate sodium (COLACE) 100 MG capsule, Take 100 mg by mouth daily as needed., Disp: , Rfl:    furosemide (LASIX) 20 MG tablet, Take 20 mg by mouth daily., Disp: , Rfl:    levothyroxine (SYNTHROID, LEVOTHROID) 50 MCG tablet, Take 50 mcg by mouth daily at 6 (six) AM., Disp: , Rfl:    lisinopril (ZESTRIL) 10 MG tablet, Take 1 tablet (10 mg total) by mouth daily., Disp: 30 tablet, Rfl: 11   loratadine (CLARITIN) 10 MG tablet, Take 10 mg by mouth daily as needed for allergies. (Patient not taking: Reported on 01/12/2023), Disp: , Rfl:    metoprolol succinate (TOPROL-XL) 25 MG 24 hr tablet, Take 3 tablets (75 mg total) by mouth daily., Disp: 30 tablet, Rfl: 1   nystatin (MYCOSTATIN/NYSTOP) powder, Apply 1 Application topically 3 (three) times daily. (Patient not taking: Reported on 01/12/2023), Disp: 15 g, Rfl: 0   nystatin-triamcinolone ointment (MYCOLOG), Apply 1 Application topically 2 (two) times daily. (Patient not taking: Reported on 01/12/2023), Disp: 30 g, Rfl: 0   potassium chloride SA (KLOR-CON M) 20 MEQ tablet, Take 1 tablet (20 mEq total) by mouth daily., Disp: 30 tablet, Rfl: 3   traMADol (ULTRAM) 50 MG tablet, Take 1 tablet (50 mg total) by mouth every 6 (six) hours as needed. (Patient not taking: Reported on 01/12/2023), Disp: 12 tablet, Rfl: 0    MEDICATION ADHERENCES TIPS AND STRATEGIES Taking medication as prescribed improves patient outcomes in heart failure (reduces hospitalizations, improves symptoms, increases survival) Side effects of medications can be managed by decreasing doses, switching agents, stopping drugs, or adding additional therapy. Please let someone [facility] the [facility] if you have having bothersome side effects so we can modify your regimen. Do not alter your medication regimen without  talking to us.[place]Medication reminders can help patients remember to take drugs on time. If you are missing or forgetting doses you can try linking behaviors, using pill boxes, or an electronic reminder like an alarm on your phone or an app. Some people can also get automated phone calls as medication reminders.

## 2023-02-10 ENCOUNTER — Ambulatory Visit: Admission: RE | Admit: 2023-02-10 | Payer: Medicare Other | Source: Ambulatory Visit

## 2023-02-15 ENCOUNTER — Encounter: Payer: Medicare Other | Admitting: Family

## 2023-02-15 ENCOUNTER — Telehealth: Payer: Self-pay | Admitting: Family

## 2023-02-15 NOTE — Telephone Encounter (Signed)
Patient did not show for her Heart Failure Clinic appointment on 02/15/23.

## 2023-03-08 ENCOUNTER — Ambulatory Visit: Payer: Medicare Other | Attending: Cardiology | Admitting: Cardiology

## 2023-03-09 ENCOUNTER — Encounter: Payer: Self-pay | Admitting: Cardiology

## 2023-05-18 ENCOUNTER — Emergency Department: Payer: Medicare Other

## 2023-05-18 ENCOUNTER — Encounter: Payer: Self-pay | Admitting: Emergency Medicine

## 2023-05-18 ENCOUNTER — Emergency Department
Admission: EM | Admit: 2023-05-18 | Discharge: 2023-05-18 | Disposition: A | Discharge status: Home / Self Care | Payer: Medicare Other | Attending: Emergency Medicine | Admitting: Emergency Medicine

## 2023-05-18 ENCOUNTER — Other Ambulatory Visit: Payer: Self-pay

## 2023-05-18 DIAGNOSIS — W19XXXA Unspecified fall, initial encounter: Secondary | ICD-10-CM

## 2023-05-18 DIAGNOSIS — S0003XA Contusion of scalp, initial encounter: Secondary | ICD-10-CM | POA: Diagnosis not present

## 2023-05-18 DIAGNOSIS — I4891 Unspecified atrial fibrillation: Secondary | ICD-10-CM | POA: Insufficient documentation

## 2023-05-18 DIAGNOSIS — W0110XA Fall on same level from slipping, tripping and stumbling with subsequent striking against unspecified object, initial encounter: Secondary | ICD-10-CM | POA: Insufficient documentation

## 2023-05-18 DIAGNOSIS — S0990XA Unspecified injury of head, initial encounter: Secondary | ICD-10-CM | POA: Diagnosis present

## 2023-05-18 LAB — BASIC METABOLIC PANEL
Anion gap: 10 (ref 5–15)
BUN: 30 mg/dL — ABNORMAL HIGH (ref 8–23)
CO2: 24 mmol/L (ref 22–32)
Calcium: 9.3 mg/dL (ref 8.9–10.3)
Chloride: 103 mmol/L (ref 98–111)
Creatinine, Ser: 1 mg/dL (ref 0.44–1.00)
GFR, Estimated: 53 mL/min — ABNORMAL LOW (ref >60)
Glucose, Bld: 103 mg/dL — ABNORMAL HIGH (ref 70–99)
Potassium: 3.9 mmol/L (ref 3.5–5.1)
Sodium: 137 mmol/L (ref 135–145)

## 2023-05-18 LAB — CBC
HCT: 45.4 % (ref 36.0–46.0)
Hemoglobin: 15.1 g/dL — ABNORMAL HIGH (ref 12.0–15.0)
MCH: 29.7 pg (ref 26.0–34.0)
MCHC: 33.3 g/dL (ref 30.0–36.0)
MCV: 89.4 fL (ref 80.0–100.0)
Platelets: 266 10*3/uL (ref 150–400)
RBC: 5.08 MIL/uL (ref 3.87–5.11)
RDW: 14 % (ref 11.5–15.5)
WBC: 14.2 10*3/uL — ABNORMAL HIGH (ref 4.0–10.5)
nRBC: 0 % (ref 0.0–0.2)

## 2023-05-18 LAB — TROPONIN I (HIGH SENSITIVITY): Troponin I (High Sensitivity): 20 ng/L — ABNORMAL HIGH (ref <18)

## 2023-05-18 NOTE — ED Provider Notes (Signed)
[facility] Provider Note   Event Date/Time   First MD Initiated Contact with Patient 05/18/23 2018     (approximate) History  Fall  HPI Olivia Lewis is a 89 y.o. female who presents after mechanical fall from standing back hitting the back of her head on concrete.  Patient denies any loss of consciousness.  Patient states that she has had difficulty remembering the event.  Patient has a friend at bedside who also provides history stating that she lost her balance on her walker and fell backwards.  No blood thinner use.  No subsequent nausea/vomiting/loss of consciousness. ROS: Patient currently denies any vision changes, tinnitus, difficulty speaking, facial droop, sore throat, chest pain, shortness of breath, abdominal pain, nausea/vomiting/diarrhea, dysuria, or weakness/numbness/paresthesias in any extremity   Physical Exam  Triage Vital Signs: ED Triage Vitals  Enc Vitals Group     BP 05/18/23 1838 (!) 219/106     Pulse Rate 05/18/23 1838 77     Resp 05/18/23 1838 18     Temp 05/18/23 1838 97.8 F (36.6 C)     Temp Source 05/18/23 1838 Oral     SpO2 05/18/23 1838 98 %     Weight --      Height --      Head Circumference --      Peak Flow --      Pain Score 05/18/23 1835 6     Pain Loc --      Pain Edu? --      Excl. in GC? --    Most recent vital signs: Vitals:   05/18/23 2100 05/18/23 2300  BP: (!) 192/103 (!) 190/109  Pulse: 72 60  Resp: (!) 22 (!) 21  Temp:    SpO2:  94%   General: Awake, oriented x4. CV:  Good peripheral perfusion.  Resp:  Normal effort.  Abd:  No distention.  Other:  Elderly overweight Caucasian female laying in bed in no acute distress.  There is a small contusion and abrasion to the lower right posterior occiput ED Results / Procedures / Treatments  Labs (all labs ordered are listed, but only abnormal results are displayed) Labs Reviewed  BASIC METABOLIC PANEL - Abnormal; Notable for the following components:       Result Value   Glucose, Bld 103 (*)    BUN 30 (*)    GFR, Estimated 53 (*)    All other components within normal limits  CBC - Abnormal; Notable for the following components:   WBC 14.2 (*)    Hemoglobin 15.1 (*)    All other components within normal limits  TROPONIN I (HIGH SENSITIVITY) - Abnormal; Notable for the following components:   Troponin I (High Sensitivity) 20 (*)    All other components within normal limits  TROPONIN I (HIGH SENSITIVITY)   EKG ED ECG REPORT I, Merwyn Katos, the attending physician, personally viewed and interpreted this ECG. Date: 05/18/2023 EKG Time: 1850 Rate: 80 Rhythm: Atrial fibrillation QRS Axis: normal Intervals: normal ST/T Wave abnormalities: normal Narrative Interpretation: Atrial fibrillation.  No evidence of acute ischemia RADIOLOGY ED MD interpretation: CT of the head without contrast interpreted by me shows no evidence of acute abnormalities including no intracerebral hemorrhage, obvious masses, or significant edema  CT of the cervical spine interpreted by me does not show any evidence of acute abnormalities including no acute fracture, malalignment, height loss, or dislocation  2 view chest x-ray interpreted by me shows no evidence of  acute abnormalities including no pneumonia, pneumothorax, or widened mediastinum -Agree with radiology assessment Official radiology report(s): CT Head Wo Contrast  Result Date: 05/18/2023 CLINICAL DATA:  Head trauma, minor (Age >= 65y); Neck trauma (Age >= 65y) EXAM: CT HEAD WITHOUT CONTRAST CT CERVICAL SPINE WITHOUT CONTRAST TECHNIQUE: Multidetector CT imaging of the head and cervical spine was performed following the standard protocol without intravenous contrast. Multiplanar CT image reconstructions of the cervical spine were also generated. RADIATION DOSE REDUCTION: This exam was performed according to the departmental dose-optimization program which includes automated exposure control,  adjustment of the mA and/or kV according to patient size and/or use of iterative reconstruction technique. COMPARISON:  CT head and cervical spine 01/01/2021 FINDINGS: CT HEAD FINDINGS Brain: Cerebral ventricle sizes are concordant with the degree of cerebral volume loss. Patchy and confluent areas of decreased attenuation are noted throughout the deep and periventricular white matter of the cerebral hemispheres bilaterally, compatible with chronic microvascular ischemic disease. No evidence of large-territorial acute infarction. No parenchymal hemorrhage. No mass lesion. No extra-axial collection. No mass effect or midline shift. No hydrocephalus. Basilar cisterns are patent. Vascular: No hyperdense vessel. Skull: No acute fracture or focal lesion. Sinuses/Orbits: Paranasal sinuses and mastoid air cells are clear. The orbits are unremarkable. Other: None. CT CERVICAL SPINE FINDINGS Alignment: Normal. Skull base and vertebrae: Multilevel degenerative changes of the spine with endplate sclerosis at the C6-C7 level. Age-indeterminate T2 incomplete burst fracture with at least 40% vertebral body height loss. No acute fracture. No aggressive appearing focal osseous lesion or focal pathologic process. Soft tissues and spinal canal: No prevertebral fluid or swelling. No visible canal hematoma. Upper chest: Unremarkable. Other: Atherosclerotic plaque of the aorta and its main branches. IMPRESSION: 1. No acute intracranial abnormality. 2. No acute displaced fracture or traumatic listhesis of the cervical spine. 3. Age-indeterminate T2 incomplete burst fracture with at least 40% vertebral body height loss. Correlate with point tenderness to palpation for an acute component. Electronically Signed   By: Tish Frederickson M.D.   On: 05/18/2023 19:30   CT Cervical Spine Wo Contrast  Result Date: 05/18/2023 CLINICAL DATA:  Head trauma, minor (Age >= 65y); Neck trauma (Age >= 65y) EXAM: CT HEAD WITHOUT CONTRAST CT CERVICAL SPINE  WITHOUT CONTRAST TECHNIQUE: Multidetector CT imaging of the head and cervical spine was performed following the standard protocol without intravenous contrast. Multiplanar CT image reconstructions of the cervical spine were also generated. RADIATION DOSE REDUCTION: This exam was performed according to the departmental dose-optimization program which includes automated exposure control, adjustment of the mA and/or kV according to patient size and/or use of iterative reconstruction technique. COMPARISON:  CT head and cervical spine 01/01/2021 FINDINGS: CT HEAD FINDINGS Brain: Cerebral ventricle sizes are concordant with the degree of cerebral volume loss. Patchy and confluent areas of decreased attenuation are noted throughout the deep and periventricular white matter of the cerebral hemispheres bilaterally, compatible with chronic microvascular ischemic disease. No evidence of large-territorial acute infarction. No parenchymal hemorrhage. No mass lesion. No extra-axial collection. No mass effect or midline shift. No hydrocephalus. Basilar cisterns are patent. Vascular: No hyperdense vessel. Skull: No acute fracture or focal lesion. Sinuses/Orbits: Paranasal sinuses and mastoid air cells are clear. The orbits are unremarkable. Other: None. CT CERVICAL SPINE FINDINGS Alignment: Normal. Skull base and vertebrae: Multilevel degenerative changes of the spine with endplate sclerosis at the C6-C7 level. Age-indeterminate T2 incomplete burst fracture with at least 40% vertebral body height loss. No acute fracture. No aggressive appearing focal osseous lesion  or focal pathologic process. Soft tissues and spinal canal: No prevertebral fluid or swelling. No visible canal hematoma. Upper chest: Unremarkable. Other: Atherosclerotic plaque of the aorta and its main branches. IMPRESSION: 1. No acute intracranial abnormality. 2. No acute displaced fracture or traumatic listhesis of the cervical spine. 3. Age-indeterminate T2  incomplete burst fracture with at least 40% vertebral body height loss. Correlate with point tenderness to palpation for an acute component. Electronically Signed   By: Tish Frederickson M.D.   On: 05/18/2023 19:30   DG Chest 2 View  Result Date: 05/18/2023 CLINICAL DATA:  Back pain.  Chest pain.  Fall EXAM: CHEST - 2 VIEW COMPARISON:  X-ray 01/08/2023 FINDINGS: Lateral view is limited due to severe kyphosis. This also obscures the frontal view with obscuration of the apices. Hyperinflation with chronic interstitial lung changes. Enlarged cardiopericardial silhouette. No edema. No frank consolidation, pneumothorax or effusion. Old rib fractures are identified. Osteopenia. Degenerative changes of the shoulders with evidence of full-thickness rotator cuff tears. Calcified aorta. IMPRESSION: Hyperinflation. Severely kyphotic x-ray. Enlarged cardiopericardial silhouette. No consolidation or pneumothorax. Old rib fractures.  Osteopenia Electronically Signed   By: Karen Kays M.D.   On: 05/18/2023 19:29   PROCEDURES: Critical Care performed: No .1-3 Lead EKG Interpretation  Performed by: Merwyn Katos, MD Authorized by: Merwyn Katos, MD     Interpretation: normal     ECG rate:  71   ECG rate assessment: normal     Rhythm: sinus rhythm     Ectopy: none     Conduction: normal    MEDICATIONS ORDERED IN ED: Medications - No data to display IMPRESSION / MDM / ASSESSMENT AND PLAN / ED COURSE  I reviewed the triage vital signs and the nursing notes.                             The patient is on the cardiac monitor to evaluate for evidence of arrhythmia and/or significant heart rate changes. Patient's presentation is most consistent with acute presentation with potential threat to life or bodily function. Presenting after a fall that occurred just prior to arrival, resulting in injury to the posterior scalp. The mechanism of injury was a mechanical ground level fall without syncope or near-syncope.  The current level of pain is moderate. There was no loss of consciousness, confusion, seizure, or memory impairment. There is a laceration associated with the injury. Denies neck pain. The patient does not take blood thinner medications. Denies vomiting, numbness/weakness, fever  Dispo: Discharge with PCP follow-up       FINAL CLINICAL IMPRESSION(S) / ED DIAGNOSES   Final diagnoses:  Fall, initial encounter  Contusion of scalp, initial encounter   Rx / DC Orders   ED Discharge Orders     None      Note:  This document was prepared using Dragon voice recognition software and may include unintentional dictation errors.   Merwyn Katos, MD 05/18/23 [phone]

## 2023-05-18 NOTE — ED Notes (Signed)
Spoken with patient and explain the wait time and check on patient needs. Patient needs met.

## 2023-05-18 NOTE — ED Provider Triage Note (Signed)
Emergency Medicine Provider Triage Evaluation Note  Olivia Lewis , a 89 y.o. female  was evaluated in triage.  Pt complains of neck pain, chest pain, and back pain after fall today. Unsure why she fell today. Fall was unwitnessed. On baby ASA.  Physical Exam  There were no vitals taken for this visit. Gen:   Awake, no distress   Resp:  Normal effort  MSK:   Moves extremities without difficulty  Other:    Medical Decision Making  Medically screening exam initiated at 6:36 PM.  Appropriate orders placed.  [place] A Trieu was informed that the remainder of the evaluation will be completed by another provider, this initial triage assessment does not replace that evaluation, and the importance of remaining in the ED until their evaluation is complete.  Imaging and labs ordered.   Chinita Pester, FNP 05/18/23 2320

## 2023-05-18 NOTE — ED Triage Notes (Signed)
Patient to ED via [facility] from home after an unwitnessed fall. Patient states she does not know how she fell but hit her head- bleeding controlled. Takes baby aspirin daily. C/o back and chest wall pain.

## 2023-05-18 NOTE — Discharge Instructions (Addendum)
Please use ibuprofen (Motrin) up to 800 mg every 8 hours, naproxen (Naprosyn) up to 500 mg every 12 hours, and/or acetaminophen (Tylenol) up to 4 g/day for any continued pain.  Please do not use this medication regimen for longer than 7 days 

## 2023-07-06 ENCOUNTER — Emergency Department
Admission: EM | Admit: 2023-07-06 | Discharge: 2023-07-06 | Disposition: A | Discharge status: Home / Self Care | Payer: Medicare Other | Source: Home / Self Care | Attending: Emergency Medicine | Admitting: Emergency Medicine

## 2023-07-06 ENCOUNTER — Emergency Department: Payer: Medicare Other

## 2023-07-06 ENCOUNTER — Encounter: Payer: Self-pay | Admitting: *Deleted

## 2023-07-06 ENCOUNTER — Other Ambulatory Visit: Payer: Self-pay

## 2023-07-06 DIAGNOSIS — W19XXXA Unspecified fall, initial encounter: Secondary | ICD-10-CM

## 2023-07-06 DIAGNOSIS — M542 Cervicalgia: Secondary | ICD-10-CM | POA: Insufficient documentation

## 2023-07-06 DIAGNOSIS — R519 Headache, unspecified: Secondary | ICD-10-CM | POA: Insufficient documentation

## 2023-07-06 DIAGNOSIS — M545 Low back pain, unspecified: Secondary | ICD-10-CM | POA: Insufficient documentation

## 2023-07-06 DIAGNOSIS — M25512 Pain in left shoulder: Secondary | ICD-10-CM | POA: Insufficient documentation

## 2023-07-06 DIAGNOSIS — J9601 Acute respiratory failure with hypoxia: Secondary | ICD-10-CM | POA: Diagnosis not present

## 2023-07-06 DIAGNOSIS — W01198A Fall on same level from slipping, tripping and stumbling with subsequent striking against other object, initial encounter: Secondary | ICD-10-CM | POA: Insufficient documentation

## 2023-07-06 DIAGNOSIS — S22030A Wedge compression fracture of third thoracic vertebra, initial encounter for closed fracture: Secondary | ICD-10-CM | POA: Insufficient documentation

## 2023-07-06 DIAGNOSIS — R0902 Hypoxemia: Secondary | ICD-10-CM | POA: Diagnosis not present

## 2023-07-06 DIAGNOSIS — Y92009 Unspecified place in unspecified non-institutional (private) residence as the place of occurrence of the external cause: Secondary | ICD-10-CM | POA: Insufficient documentation

## 2023-07-06 MED ORDER — ACETAMINOPHEN 325 MG PO TABS
650.0000 mg | ORAL_TABLET | Freq: Once | ORAL | Status: DC
  Filled 2023-07-06: qty 2

## 2023-07-06 MED ORDER — ACETAMINOPHEN 325 MG PO TABS
650.0000 mg | ORAL_TABLET | Freq: Once | ORAL | Status: AC
  Administered 2023-07-06: 650 mg via ORAL
  Filled 2023-07-06: qty 2

## 2023-07-06 NOTE — ED Notes (Signed)
Pt was 90% on RA for ems and was placed on 2L [place].  Took O2 off briefly to check RA spo2 and pt was 89% on RA, no distress.  She does not have home O2.  Placed her back on 2L [place] and she was 95%.  Pt reports chronic neck and back pain which is increased at this time.

## 2023-07-06 NOTE — ED Notes (Signed)
Pt being d/ced by RN. RN got pt in wc and very weak. Caregiver asks pts if she is going to be able to make it up the stairs to get inside. Pt states she does not think she can do it and will need ems to take her home. Pt still addiment about leaving.

## 2023-07-06 NOTE — ED Notes (Signed)
Provider at bedside and discussing results and options with pt. Pt states she does not want to stay in the hospital.

## 2023-07-06 NOTE — ED Notes (Signed)
ED Provider at bedside. 

## 2023-07-06 NOTE — ED Provider Notes (Signed)
----------------------------------------- 8:05 PM on 07/06/2023 -----------------------------------------  Blood pressure (!) 161/80, pulse (!) 59, temperature 97.9 F (36.6 C), temperature source Oral, resp. rate 18, SpO2 100%.  Assuming care from Dr. Claudell Kyle, PA-C/NP-C.  In short, Olivia Lewis is a 89 y.o. female with a chief complaint of Fall .  Refer to the original H&P for additional details.  The current plan of care is to await remaining CT results and consult neurology for what appears to be an acute T3 compression fracture related to the patient's mechanical fall.  All remaining CTs are negative with no signs of any acute fracture or dislocation.  ____________________________________________    ED Results / Procedures / Treatments   Labs (all labs ordered are listed, but only abnormal results are displayed) Labs Reviewed - No data to display   EKG    RADIOLOGY  I personally viewed and evaluated these images as part of my medical decision making, as well as reviewing the written report by the radiologist.  ED Provider Interpretation: Acute versus subacute T3 compression fracture with acute versus subacute T3 spinous process fracture  DG Shoulder Left  Result Date: 07/06/2023 CLINICAL DATA:  Pain post fall EXAM: LEFT SHOULDER - 2+ VIEW COMPARISON:  None Available. FINDINGS: AC joint appears intact. Y-views are limited due to positioning. No definite dislocation. No definitive fracture. Slight narrowing of subacromial space which could be due to rotator cuff disease. IMPRESSION: No definite acute osseous abnormality. Electronically Signed   By: Jasmine Pang M.D.   On: 07/06/2023 20:59   CT Thoracic Spine Wo Contrast  Result Date: 07/06/2023 CLINICAL DATA:  fall, thoracic spine TTP, evaluating for fracture; fall, L spine tenderness to palpation, evaluating for fractures EXAM: CT THORACIC AND LUMBAR SPINE WITHOUT CONTRAST TECHNIQUE: Multidetector CT imaging of the  thoracic and lumbar spine was performed without contrast. Multiplanar CT image reconstructions were also generated. RADIATION DOSE REDUCTION: This exam was performed according to the departmental dose-optimization program which includes automated exposure control, adjustment of the mA and/or kV according to patient size and/or use of iterative reconstruction technique. COMPARISON:  CT C-spine 07/01/2022, chest x-ray 01/08/2023 FINDINGS: CT THORACIC SPINE FINDINGS Alignment: Exaggerated kyphotic curvature centered at the T3 level in the setting of compression fractures. Vertebrae: Subacute to chronic T11 right transverse process fracture. Chronic right T12 transverse process fracture. Chronic right T5 - T7 transverse process fracture. Chronic appearing compression fracture of the T1 level with 40% vertebral body height loss, T2 level with 50% vertebral body height loss, T3 level with up to 60% vertebral body height loss. 2 mm retropulsion into the central canal at the T3 level. No definite acute displaced fracture. Paraspinal and other soft tissues: Negative. Disc levels: Grossly maintained CT LUMBAR SPINE FINDINGS Segmentation: 5 lumbar type vertebrae. Alignment: Dextroscoliosis centered at the L2-L3 level. Vertebrae: Chronic right L1, L2, L3, L4 transverse process fracture. Moderate severe degenerative changes. No acute fracture or focal pathologic process. Paraspinal and other soft tissues: Negative. Disc levels: Multilevel moderate to severe intervertebral narrowing and vacuum phenomenon at the L1-L2, L2-L3 levels. Other: Atherosclerotic plaque.  At least 3 vessel coronary calcifications. Cardiomegaly peer Biapical pleural/pulmonary scarring. Trace right pleural effusion. Bilateral lower lobe consolidations. Old healed right rib fractures. Severe degenerative changes of the right shoulder with deformity of the right humeral head. IMPRESSION: CT THORACIC SPINE IMPRESSION 1. No definite acute displaced fracture or  traumatic listhesis of the thoracic spine. 2. Chronic appearing compression fracture of the T1 level with 40% vertebral body  height loss, T2 level with 50% vertebral body height loss, T3 level with up to 60% vertebral body height loss. Associated decreased exaggerated kyphotic curvature. Correlate point tenderness to palpation to evaluate for an acute component. CT LUMBAR SPINE IMPRESSION 1. No acute displaced fracture or traumatic listhesis of the lumbar spine. Other imaging findings of potential clinical significance: 1. Trace right pleural effusion. Bilateral lower lobe atelectasis with superimposed infection not excluded-recommend correlation With chest x-ray. 2. Cardiomegaly. 3. Aortic Atherosclerosis (ICD10-I70.0) with at least 3 vessel coronary calcification. 4. Severe degenerative changes of the right shoulder. Electronically Signed   By: Tish Frederickson M.D.   On: 07/06/2023 19:50   CT Lumbar Spine Wo Contrast  Result Date: 07/06/2023 CLINICAL DATA:  fall, thoracic spine TTP, evaluating for fracture; fall, L spine tenderness to palpation, evaluating for fractures EXAM: CT THORACIC AND LUMBAR SPINE WITHOUT CONTRAST TECHNIQUE: Multidetector CT imaging of the thoracic and lumbar spine was performed without contrast. Multiplanar CT image reconstructions were also generated. RADIATION DOSE REDUCTION: This exam was performed according to the departmental dose-optimization program which includes automated exposure control, adjustment of the mA and/or kV according to patient size and/or use of iterative reconstruction technique. COMPARISON:  CT C-spine 07/01/2022, chest x-ray 01/08/2023 FINDINGS: CT THORACIC SPINE FINDINGS Alignment: Exaggerated kyphotic curvature centered at the T3 level in the setting of compression fractures. Vertebrae: Subacute to chronic T11 right transverse process fracture. Chronic right T12 transverse process fracture. Chronic right T5 - T7 transverse process fracture. Chronic appearing  compression fracture of the T1 level with 40% vertebral body height loss, T2 level with 50% vertebral body height loss, T3 level with up to 60% vertebral body height loss. 2 mm retropulsion into the central canal at the T3 level. No definite acute displaced fracture. Paraspinal and other soft tissues: Negative. Disc levels: Grossly maintained CT LUMBAR SPINE FINDINGS Segmentation: 5 lumbar type vertebrae. Alignment: Dextroscoliosis centered at the L2-L3 level. Vertebrae: Chronic right L1, L2, L3, L4 transverse process fracture. Moderate severe degenerative changes. No acute fracture or focal pathologic process. Paraspinal and other soft tissues: Negative. Disc levels: Multilevel moderate to severe intervertebral narrowing and vacuum phenomenon at the L1-L2, L2-L3 levels. Other: Atherosclerotic plaque.  At least 3 vessel coronary calcifications. Cardiomegaly peer Biapical pleural/pulmonary scarring. Trace right pleural effusion. Bilateral lower lobe consolidations. Old healed right rib fractures. Severe degenerative changes of the right shoulder with deformity of the right humeral head. IMPRESSION: CT THORACIC SPINE IMPRESSION 1. No definite acute displaced fracture or traumatic listhesis of the thoracic spine. 2. Chronic appearing compression fracture of the T1 level with 40% vertebral body height loss, T2 level with 50% vertebral body height loss, T3 level with up to 60% vertebral body height loss. Associated decreased exaggerated kyphotic curvature. Correlate point tenderness to palpation to evaluate for an acute component. CT LUMBAR SPINE IMPRESSION 1. No acute displaced fracture or traumatic listhesis of the lumbar spine. Other imaging findings of potential clinical significance: 1. Trace right pleural effusion. Bilateral lower lobe atelectasis with superimposed infection not excluded-recommend correlation With chest x-ray. 2. Cardiomegaly. 3. Aortic Atherosclerosis (ICD10-I70.0) with at least 3 vessel coronary  calcification. 4. Severe degenerative changes of the right shoulder. Electronically Signed   By: Tish Frederickson M.D.   On: 07/06/2023 19:50   CT Head Wo Contrast  Result Date: 07/06/2023 CLINICAL DATA:  Lost balance fell backwards and struck head. EXAM: CT HEAD WITHOUT CONTRAST CT CERVICAL SPINE WITHOUT CONTRAST TECHNIQUE: Multidetector CT imaging of the head and cervical spine  was performed following the standard protocol without intravenous contrast. Multiplanar CT image reconstructions of the cervical spine were also generated. RADIATION DOSE REDUCTION: This exam was performed according to the departmental dose-optimization program which includes automated exposure control, adjustment of the mA and/or kV according to patient size and/or use of iterative reconstruction technique. COMPARISON:  CT brain and cervical spine 05/18/2023, 07/01/2022 FINDINGS: CT HEAD FINDINGS Brain: No acute territorial infarction, hemorrhage or intracranial mass. Mild atrophy. Extensive white matter hypodensity. Stable ventricle size Vascular: No hyperdense vessels.  Carotid vascular calcification Skull: Normal. Negative for fracture or focal lesion. Sinuses/Orbits: No acute finding. Other: None CT CERVICAL SPINE FINDINGS Alignment: Limited by habitus, osteopenia and positioning. Trace anterolisthesis C4 on C5 and C5 on C6. Skull base and vertebrae: Cranio vertebral junction appears intact. The vertebral body heights are maintained. Possible chronic fracture deformity right lamina of C7. Chronic moderate severe compression fracture of T2 as compared with June examination. Mild loss of height T1 vertebral body with suspected lucency through the vertebral body on sagittal views, series 6, image 29. Subacute to chronic left transverse process fracture at T2, series 8, image 79. Interval moderate severe compression fracture of T3 with close to 50% loss of vertebral body height. Acute to subacute appearing left transverse process  fracture at T3, series 8, image 89. Soft tissues and spinal canal: No prevertebral fluid or swelling. No visible canal hematoma. Mild retropulsion of the inferior aspect of T3 vertebral body but without significant canal stenosis. Disc levels: Multilevel degenerative changes, worst at C5-C6, C6-C7. Upper chest: Negative. Other: None IMPRESSION: 1. No CT evidence for acute intracranial abnormality. Atrophy and extensive chronic small vessel ischemic changes of the white matter. 2. Limited cervical spine CT due to habitus, osteopenia and positioning. No definite acute osseous abnormality. 3. Interval moderate severe compression fracture of T3 with mild retropulsion but no significant canal stenosis, appearance suggests acute to subacute process. Acute to subacute appearing left transverse process fracture at T3. 4. Mild loss of height T1 vertebral body with suspected anterior to posterior lucency through the vertebral body on sagittal views, suspect acute to subacute fracture. 5. Subacute to chronic left transverse process fracture at T2. Chronic moderate severe compression fracture of T2 (when compared with CT from June 2024). Electronically Signed   By: Jasmine Pang M.D.   On: 07/06/2023 18:33   CT Cervical Spine Wo Contrast  Result Date: 07/06/2023 CLINICAL DATA:  Lost balance fell backwards and struck head. EXAM: CT HEAD WITHOUT CONTRAST CT CERVICAL SPINE WITHOUT CONTRAST TECHNIQUE: Multidetector CT imaging of the head and cervical spine was performed following the standard protocol without intravenous contrast. Multiplanar CT image reconstructions of the cervical spine were also generated. RADIATION DOSE REDUCTION: This exam was performed according to the departmental dose-optimization program which includes automated exposure control, adjustment of the mA and/or kV according to patient size and/or use of iterative reconstruction technique. COMPARISON:  CT brain and cervical spine 05/18/2023, 07/01/2022  FINDINGS: CT HEAD FINDINGS Brain: No acute territorial infarction, hemorrhage or intracranial mass. Mild atrophy. Extensive white matter hypodensity. Stable ventricle size Vascular: No hyperdense vessels.  Carotid vascular calcification Skull: Normal. Negative for fracture or focal lesion. Sinuses/Orbits: No acute finding. Other: None CT CERVICAL SPINE FINDINGS Alignment: Limited by habitus, osteopenia and positioning. Trace anterolisthesis C4 on C5 and C5 on C6. Skull base and vertebrae: Cranio vertebral junction appears intact. The vertebral body heights are maintained. Possible chronic fracture deformity right lamina of C7. Chronic moderate severe compression fracture  of T2 as compared with June examination. Mild loss of height T1 vertebral body with suspected lucency through the vertebral body on sagittal views, series 6, image 29. Subacute to chronic left transverse process fracture at T2, series 8, image 79. Interval moderate severe compression fracture of T3 with close to 50% loss of vertebral body height. Acute to subacute appearing left transverse process fracture at T3, series 8, image 89. Soft tissues and spinal canal: No prevertebral fluid or swelling. No visible canal hematoma. Mild retropulsion of the inferior aspect of T3 vertebral body but without significant canal stenosis. Disc levels: Multilevel degenerative changes, worst at C5-C6, C6-C7. Upper chest: Negative. Other: None IMPRESSION: 1. No CT evidence for acute intracranial abnormality. Atrophy and extensive chronic small vessel ischemic changes of the white matter. 2. Limited cervical spine CT due to habitus, osteopenia and positioning. No definite acute osseous abnormality. 3. Interval moderate severe compression fracture of T3 with mild retropulsion but no significant canal stenosis, appearance suggests acute to subacute process. Acute to subacute appearing left transverse process fracture at T3. 4. Mild loss of height T1 vertebral body with  suspected anterior to posterior lucency through the vertebral body on sagittal views, suspect acute to subacute fracture. 5. Subacute to chronic left transverse process fracture at T2. Chronic moderate severe compression fracture of T2 (when compared with CT from June 2024). Electronically Signed   By: Jasmine Pang M.D.   On: 07/06/2023 18:33     PROCEDURES:  Critical Care performed: No  Procedures   MEDICATIONS ORDERED IN ED: Medications  acetaminophen (TYLENOL) tablet 650 mg (650 mg Oral Not Given 07/06/23 2012)  acetaminophen (TYLENOL) tablet 650 mg (650 mg Oral Given 07/06/23 2013)     IMPRESSION / MDM / ASSESSMENT AND PLAN / ED COURSE  I reviewed the triage vital signs and the nursing notes.                              Differential diagnosis includes, but is not limited to, [facility], skull fracture, cervical fracture, spinal compression fracture, shoulder fracture, shoulder dislocation  Patient's presentation is most consistent with acute complicated illness / injury requiring diagnostic workup.  ----------------------------------------- 8:28 PM on 07/06/2023 ----------------------------------------- S/W Dr. Myer Haff: he will see the patient in the outpatient setting for ongoing fracture management.   Patient's diagnosis is consistent with mechanical fall resulting in acute versus subacute T3 compression and spinous process fracture.  No other acute findings on remaining CT images.  Plain x-ray of the left shoulder without evidence of acute fracture or dislocation. Patient will be discharged home with tractions take OTC Tylenol and Ultram previously prescribed. Patient is to follow up with her primary provider and Dr. Venetia Night for ongoing fracture management, as needed or otherwise directed. Patient is given ED precautions to return to the ED for any worsening or new symptoms.  Clinical Course as of 07/06/23 2110  Wed Jul 06, 2023  1954 Patient signed out to PA awaiting  consultation with neurosurgery and final disposition pending their recommendations and pain control [DW]    Clinical Course User Index [DW] Janith Lima, MD    FINAL CLINICAL IMPRESSION(S) / ED DIAGNOSES   Final diagnoses:  Fall in home, initial encounter  Closed wedge compression fracture of T3 vertebra, initial encounter (HCC)  Acute pain of left shoulder     Rx / DC Orders   ED Discharge Orders     None  Note:  This document was prepared using Dragon voice recognition software and may include unintentional dictation errors.    Lissa Hoard, PA-C 07/06/23 2110    Corena Herter, MD 07/07/23 [phone]

## 2023-07-06 NOTE — ED Provider Notes (Signed)
[facility] Provider Note    Event Date/Time   First MD Initiated Contact with Patient 07/06/23 1827     (approximate)   History   Fall   HPI Olivia Lewis is a 89 y.o. female presenting today for fall.  Patient states she was up walking earlier today when she lost her balance and fell backwards.  She hit the back of her head.  She notes some pain in the back of her head as well as chronic pain to her neck, mid spine, and lower spine.  She is unsure if any of the pain today is new or worse.  She denied pain elsewhere on her extremities, abdomen, or chest.  She is not on blood thinners.  She did not lose consciousness.  Patient is here with friend who state that she does have 24/7 observation by friends that help her do her normal activities.  Of note, patient had fall 2 months ago and was found to have T2 fracture of indeterminate age.  She states that she did nothing about this at that time.     Physical Exam   Triage Vital Signs: ED Triage Vitals  Encounter Vitals Group     BP 07/06/23 1734 (!) 168/100     Systolic BP Percentile --      Diastolic BP Percentile --      Pulse Rate 07/06/23 1734 64     Resp 07/06/23 1734 16     Temp 07/06/23 1734 97.9 F (36.6 C)     Temp Source 07/06/23 1734 Oral     SpO2 07/06/23 1734 95 %     Weight --      Height --      Head Circumference --      Peak Flow --      Pain Score 07/06/23 1725 5     Pain Loc --      Pain Education --      Exclude from Growth Chart --     Most recent vital signs: Vitals:   07/06/23 2100 07/06/23 2313  BP: (!) 164/82 (!) 154/83  Pulse: (!) 54 (!) 54  Resp: 16 16  Temp: 97.8 F (36.6 C) 97.9 F (36.6 C)  SpO2: 96% 98%    Physical Exam: I have reviewed the vital signs and nursing notes. General: Awake, alert, no acute distress.  Nontoxic appearing. Head:  Atraumatic, normocephalic.   ENT:  EOM intact, PERRL. Oral mucosa is pink and moist with no lesions. Neck: Neck  is supple with full range of motion, No meningeal signs. Cardiovascular:  RRR, No murmurs. Peripheral pulses palpable and equal bilaterally. Respiratory:  Symmetrical chest wall expansion.  No rhonchi, rales, or wheezes.  Good air movement throughout.  No use of accessory muscles.   Musculoskeletal:  No cyanosis or edema. Moving extremities with full ROM.  Mild tenderness palpation of C-spine, T-spine, and L-spine.  Unable to differentiate if this is new or chronic per patient. Abdomen:  Soft, nontender, nondistended. Neuro:  GCS 15, moving all four extremities, interacting appropriately. Speech clear. Psych:  Calm, appropriate.   Skin:  Warm, dry, no rash.     ED Results / Procedures / Treatments   Labs (all labs ordered are listed, but only abnormal results are displayed) Labs Reviewed - No data to display   EKG    RADIOLOGY Independently viewed and interpreted CT Head and C-spine with no acute intracranial abnormalities. Concern for T3 fracture on CT. See  radiologist's read.   PROCEDURES:  Critical Care performed: No  Procedures   MEDICATIONS ORDERED IN ED: Medications  acetaminophen (TYLENOL) tablet 650 mg (650 mg Oral Given 07/06/23 2013)     IMPRESSION / MDM / ASSESSMENT AND PLAN / ED COURSE  I reviewed the triage vital signs and the nursing notes.                              Differential diagnosis includes, but is not limited to, ICH, concussion, Cervical spine fracture, thoracic spine fracture, lumbar spine fracture.  Patient's presentation is most consistent with acute presentation with potential threat to life or bodily function.  Patient is a 89 year old female presenting today for ground level fall with head injury. Initial CT imaging done in triage showed concern for new T3 compression fracture. Additional imaging performed of T- and L-spine. Patient signed out to oncoming provider while awaiting results of T- and L-spine imaging and discussion with  neurosurgery regarding disposition and treatment.  The patient is on the cardiac monitor to evaluate for evidence of arrhythmia and/or significant heart rate changes. Clinical Course as of 07/07/23 1527  Wed Jul 06, 2023  1954 Patient signed out to PA awaiting consultation with neurosurgery and final disposition pending their recommendations and pain control [DW]    Clinical Course User Index [DW] Janith Lima, MD     FINAL CLINICAL IMPRESSION(S) / ED DIAGNOSES   Final diagnoses:  Fall in home, initial encounter  Closed wedge compression fracture of T3 vertebra, initial encounter (HCC)  Acute pain of left shoulder     Rx / DC Orders   ED Discharge Orders     None        Note:  This document was prepared using Dragon voice recognition software and may include unintentional dictation errors.   Janith Lima, MD 07/07/23 [phone]

## 2023-07-06 NOTE — Discharge Instructions (Addendum)
Your exam, CT scans, and x-ray have been reviewed.  No evidence of any shoulder fracture or head injury.  There is evidence of possible new T-spine vertebral fracture.  Take your Tylenol as needed and Ultram (tramadol) as necessary for more severe pain.  Follow-up with your primary provider and neurosurgery for ongoing evaluation.

## 2023-07-06 NOTE — ED Triage Notes (Signed)
Pt arrives by ems from home for fall.  Pt lost balance and fell backwards and struck her head.  Fall was witnessed.  No LOC, she is not on any blood thinners.  No distress

## 2023-07-07 ENCOUNTER — Other Ambulatory Visit: Payer: Self-pay

## 2023-07-07 ENCOUNTER — Inpatient Hospital Stay
Admission: EM | Admit: 2023-07-07 | Discharge: 2023-07-11 | DRG: 189 | Disposition: A | Discharge status: Skilled Nursing Facility | Payer: Medicare Other | Attending: Obstetrics and Gynecology | Admitting: Obstetrics and Gynecology

## 2023-07-07 ENCOUNTER — Emergency Department: Payer: Medicare Other

## 2023-07-07 DIAGNOSIS — M25512 Pain in left shoulder: Secondary | ICD-10-CM | POA: Diagnosis present

## 2023-07-07 DIAGNOSIS — Z803 Family history of malignant neoplasm of breast: Secondary | ICD-10-CM

## 2023-07-07 DIAGNOSIS — J9601 Acute respiratory failure with hypoxia: Principal | ICD-10-CM

## 2023-07-07 DIAGNOSIS — Z800 Family history of malignant neoplasm of digestive organs: Secondary | ICD-10-CM

## 2023-07-07 DIAGNOSIS — W010XXA Fall on same level from slipping, tripping and stumbling without subsequent striking against object, initial encounter: Secondary | ICD-10-CM | POA: Diagnosis present

## 2023-07-07 DIAGNOSIS — S2249XA Multiple fractures of ribs, unspecified side, initial encounter for closed fracture: Secondary | ICD-10-CM

## 2023-07-07 DIAGNOSIS — E785 Hyperlipidemia, unspecified: Secondary | ICD-10-CM | POA: Diagnosis present

## 2023-07-07 DIAGNOSIS — M800AXA Age-related osteoporosis with current pathological fracture, other site, initial encounter for fracture: Secondary | ICD-10-CM | POA: Diagnosis present

## 2023-07-07 DIAGNOSIS — Z9842 Cataract extraction status, left eye: Secondary | ICD-10-CM

## 2023-07-07 DIAGNOSIS — S2241XA Multiple fractures of ribs, right side, initial encounter for closed fracture: Secondary | ICD-10-CM | POA: Diagnosis not present

## 2023-07-07 DIAGNOSIS — W19XXXD Unspecified fall, subsequent encounter: Principal | ICD-10-CM

## 2023-07-07 DIAGNOSIS — I272 Pulmonary hypertension, unspecified: Secondary | ICD-10-CM | POA: Insufficient documentation

## 2023-07-07 DIAGNOSIS — I4891 Unspecified atrial fibrillation: Secondary | ICD-10-CM | POA: Diagnosis present

## 2023-07-07 DIAGNOSIS — K219 Gastro-esophageal reflux disease without esophagitis: Secondary | ICD-10-CM | POA: Diagnosis present

## 2023-07-07 DIAGNOSIS — E440 Moderate protein-calorie malnutrition: Secondary | ICD-10-CM | POA: Insufficient documentation

## 2023-07-07 DIAGNOSIS — S22030A Wedge compression fracture of third thoracic vertebra, initial encounter for closed fracture: Secondary | ICD-10-CM

## 2023-07-07 DIAGNOSIS — W19XXXA Unspecified fall, initial encounter: Secondary | ICD-10-CM | POA: Diagnosis present

## 2023-07-07 DIAGNOSIS — R1310 Dysphagia, unspecified: Secondary | ICD-10-CM

## 2023-07-07 DIAGNOSIS — Z6824 Body mass index (BMI) 24.0-24.9, adult: Secondary | ICD-10-CM

## 2023-07-07 DIAGNOSIS — S22030D Wedge compression fracture of third thoracic vertebra, subsequent encounter for fracture with routine healing: Secondary | ICD-10-CM

## 2023-07-07 DIAGNOSIS — I10 Essential (primary) hypertension: Secondary | ICD-10-CM | POA: Diagnosis present

## 2023-07-07 DIAGNOSIS — Z79890 Hormone replacement therapy (postmenopausal): Secondary | ICD-10-CM

## 2023-07-07 DIAGNOSIS — Z79899 Other long term (current) drug therapy: Secondary | ICD-10-CM

## 2023-07-07 DIAGNOSIS — Z66 Do not resuscitate: Secondary | ICD-10-CM | POA: Diagnosis present

## 2023-07-07 DIAGNOSIS — M8088XA Other osteoporosis with current pathological fracture, vertebra(e), initial encounter for fracture: Secondary | ICD-10-CM | POA: Diagnosis present

## 2023-07-07 DIAGNOSIS — Y9301 Activity, walking, marching and hiking: Secondary | ICD-10-CM | POA: Diagnosis present

## 2023-07-07 DIAGNOSIS — R0902 Hypoxemia: Secondary | ICD-10-CM

## 2023-07-07 DIAGNOSIS — J9811 Atelectasis: Secondary | ICD-10-CM | POA: Diagnosis present

## 2023-07-07 DIAGNOSIS — Z833 Family history of diabetes mellitus: Secondary | ICD-10-CM

## 2023-07-07 DIAGNOSIS — G8929 Other chronic pain: Secondary | ICD-10-CM | POA: Diagnosis present

## 2023-07-07 DIAGNOSIS — Z9841 Cataract extraction status, right eye: Secondary | ICD-10-CM

## 2023-07-07 DIAGNOSIS — Z8249 Family history of ischemic heart disease and other diseases of the circulatory system: Secondary | ICD-10-CM

## 2023-07-07 DIAGNOSIS — Z880 Allergy status to penicillin: Secondary | ICD-10-CM

## 2023-07-07 DIAGNOSIS — N183 Chronic kidney disease, stage 3 unspecified: Secondary | ICD-10-CM | POA: Diagnosis present

## 2023-07-07 DIAGNOSIS — R296 Repeated falls: Secondary | ICD-10-CM | POA: Diagnosis present

## 2023-07-07 DIAGNOSIS — I130 Hypertensive heart and chronic kidney disease with heart failure and stage 1 through stage 4 chronic kidney disease, or unspecified chronic kidney disease: Secondary | ICD-10-CM | POA: Diagnosis present

## 2023-07-07 DIAGNOSIS — I5032 Chronic diastolic (congestive) heart failure: Secondary | ICD-10-CM | POA: Diagnosis present

## 2023-07-07 DIAGNOSIS — Z7982 Long term (current) use of aspirin: Secondary | ICD-10-CM

## 2023-07-07 DIAGNOSIS — Y92009 Unspecified place in unspecified non-institutional (private) residence as the place of occurrence of the external cause: Secondary | ICD-10-CM

## 2023-07-07 DIAGNOSIS — R54 Age-related physical debility: Secondary | ICD-10-CM | POA: Diagnosis present

## 2023-07-07 DIAGNOSIS — D72820 Lymphocytosis (symptomatic): Secondary | ICD-10-CM | POA: Diagnosis present

## 2023-07-07 DIAGNOSIS — Z96652 Presence of left artificial knee joint: Secondary | ICD-10-CM | POA: Diagnosis present

## 2023-07-07 DIAGNOSIS — I5030 Unspecified diastolic (congestive) heart failure: Secondary | ICD-10-CM | POA: Insufficient documentation

## 2023-07-07 DIAGNOSIS — E039 Hypothyroidism, unspecified: Secondary | ICD-10-CM | POA: Diagnosis present

## 2023-07-07 DIAGNOSIS — Z882 Allergy status to sulfonamides status: Secondary | ICD-10-CM

## 2023-07-07 DIAGNOSIS — N1832 Chronic kidney disease, stage 3b: Secondary | ICD-10-CM | POA: Diagnosis present

## 2023-07-07 DIAGNOSIS — Z85828 Personal history of other malignant neoplasm of skin: Secondary | ICD-10-CM

## 2023-07-07 DIAGNOSIS — M810 Age-related osteoporosis without current pathological fracture: Secondary | ICD-10-CM | POA: Diagnosis present

## 2023-07-07 DIAGNOSIS — M8000XA Age-related osteoporosis with current pathological fracture, unspecified site, initial encounter for fracture: Secondary | ICD-10-CM

## 2023-07-07 LAB — COMPREHENSIVE METABOLIC PANEL
ALT: 19 U/L (ref 0–44)
AST: 40 U/L (ref 15–41)
Albumin: 3.5 g/dL (ref 3.5–5.0)
Alkaline Phosphatase: 73 U/L (ref 38–126)
Anion gap: 9 (ref 5–15)
BUN: 26 mg/dL — ABNORMAL HIGH (ref 8–23)
CO2: 27 mmol/L (ref 22–32)
Calcium: 9 mg/dL (ref 8.9–10.3)
Chloride: 103 mmol/L (ref 98–111)
Creatinine, Ser: 1.1 mg/dL — ABNORMAL HIGH (ref 0.44–1.00)
GFR, Estimated: 47 mL/min — ABNORMAL LOW (ref >60)
Glucose, Bld: 115 mg/dL — ABNORMAL HIGH (ref 70–99)
Potassium: 4.5 mmol/L (ref 3.5–5.1)
Sodium: 139 mmol/L (ref 135–145)
Total Bilirubin: 1.4 mg/dL — ABNORMAL HIGH (ref 0.3–1.2)
Total Protein: 6.5 g/dL (ref 6.5–8.1)

## 2023-07-07 LAB — CBC
HCT: 46.1 % — ABNORMAL HIGH (ref 36.0–46.0)
Hemoglobin: 15.3 g/dL — ABNORMAL HIGH (ref 12.0–15.0)
MCH: 29.7 pg (ref 26.0–34.0)
MCHC: 33.2 g/dL (ref 30.0–36.0)
MCV: 89.3 fL (ref 80.0–100.0)
Platelets: 279 10*3/uL (ref 150–400)
RBC: 5.16 MIL/uL — ABNORMAL HIGH (ref 3.87–5.11)
RDW: 14.8 % (ref 11.5–15.5)
WBC: 14.2 10*3/uL — ABNORMAL HIGH (ref 4.0–10.5)
nRBC: 0 % (ref 0.0–0.2)

## 2023-07-07 MED ORDER — LISINOPRIL 10 MG PO TABS
10.0000 mg | ORAL_TABLET | Freq: Every day | ORAL | Status: DC
  Administered 2023-07-08 – 2023-07-11 (×4): 10 mg via ORAL
  Filled 2023-07-07 (×4): qty 1

## 2023-07-07 MED ORDER — ONDANSETRON HCL 4 MG/2ML IJ SOLN: 4.0000 mg | Freq: Four times a day (QID) | INTRAMUSCULAR | Status: DC | PRN

## 2023-07-07 MED ORDER — OXYCODONE HCL 5 MG PO TABS
5.0000 mg | ORAL_TABLET | Freq: Four times a day (QID) | ORAL | Status: DC | PRN
  Administered 2023-07-08 – 2023-07-11 (×4): 5 mg via ORAL
  Filled 2023-07-07 (×4): qty 1

## 2023-07-07 MED ORDER — LEVOTHYROXINE SODIUM 50 MCG PO TABS
50.0000 ug | ORAL_TABLET | Freq: Every day | ORAL | Status: DC
  Administered 2023-07-08 – 2023-07-11 (×4): 50 ug via ORAL
  Filled 2023-07-07 (×4): qty 1

## 2023-07-07 MED ORDER — FENTANYL CITRATE PF 50 MCG/ML IJ SOSY
25.0000 ug | PREFILLED_SYRINGE | Freq: Once | INTRAMUSCULAR | Status: AC
  Administered 2023-07-07: 25 ug via INTRAVENOUS
  Filled 2023-07-07: qty 1

## 2023-07-07 MED ORDER — ACETAMINOPHEN 160 MG/5ML PO SOLN
650.0000 mg | Freq: Four times a day (QID) | ORAL | Status: DC
  Administered 2023-07-07 – 2023-07-09 (×7): 650 mg via ORAL
  Filled 2023-07-07 (×10): qty 20.3

## 2023-07-07 MED ORDER — POLYETHYLENE GLYCOL 3350 17 G PO PACK
17.0000 g | PACK | Freq: Every day | ORAL | Status: DC | PRN
  Administered 2023-07-10: 17 g via ORAL
  Filled 2023-07-07: qty 1

## 2023-07-07 MED ORDER — ONDANSETRON HCL 4 MG PO TABS: 4.0000 mg | ORAL_TABLET | Freq: Four times a day (QID) | ORAL | Status: DC | PRN

## 2023-07-07 MED ORDER — SODIUM CHLORIDE 0.9% FLUSH
3.0000 mL | Freq: Two times a day (BID) | INTRAVENOUS | Status: DC
  Administered 2023-07-07 – 2023-07-11 (×9): 3 mL via INTRAVENOUS

## 2023-07-07 MED ORDER — ASPIRIN 81 MG PO TBEC
81.0000 mg | DELAYED_RELEASE_TABLET | Freq: Every day | ORAL | Status: DC
  Administered 2023-07-08 – 2023-07-09 (×2): 81 mg via ORAL
  Filled 2023-07-07 (×2): qty 1

## 2023-07-07 MED ORDER — SODIUM CHLORIDE 0.9 % IV SOLN: Freq: Once | INTRAVENOUS | Status: AC

## 2023-07-07 MED ORDER — HYDROMORPHONE HCL 1 MG/ML IJ SOLN
0.5000 mg | INTRAMUSCULAR | Status: DC | PRN
  Administered 2023-07-08: 0.5 mg via INTRAVENOUS
  Filled 2023-07-07: qty 0.5

## 2023-07-07 MED ORDER — METOPROLOL SUCCINATE ER 50 MG PO TB24
75.0000 mg | ORAL_TABLET | Freq: Every day | ORAL | Status: DC
  Filled 2023-07-07 (×2): qty 1

## 2023-07-07 NOTE — ED Notes (Signed)
Patient stated that she has only had her Synthyroid this morning and nothing to eat or drink

## 2023-07-07 NOTE — ED Notes (Signed)
Request for transport to new room was placed.

## 2023-07-07 NOTE — Progress Notes (Signed)
MD at bedside when attempted admission profile

## 2023-07-07 NOTE — Assessment & Plan Note (Signed)
-   Continue home Synthroid °

## 2023-07-07 NOTE — Assessment & Plan Note (Signed)
Renal function currently at baseline.   - Monitor renal function intermittently while admitted

## 2023-07-07 NOTE — Assessment & Plan Note (Signed)
Patient's leukocytosis slightly increased compared to prior, but relatively stable otherwise.   - Continue outpatient follow up with Heme/Onc as indicated.

## 2023-07-07 NOTE — Assessment & Plan Note (Signed)
-   Continue home regimen - Patient not on AC due to high risk falls

## 2023-07-07 NOTE — Evaluation (Signed)
Physical Therapy Evaluation Patient Details Name: Olivia Lewis MRN: [id number] DOB: Mar 03, 1929 Today's Date: 07/07/2023  History of Present Illness  Pt is a 89 y.o. female with a past medical history of anemia, CHF, CKD, gastric reflux, hypertension, hyperlipidemia, presents to the emergency department for shortness of breath.  According to report and patient she was seen in the EDt yesterday following a fall.  Patient workup included CT images of her spine and head with no significant findings or traumatic findings.  Patient was discharged home but continues to complain of pain across the lower chest as well as SOB.  Patient started on 2L [place] due to desat.  No fever.  No reported cough.  Patient is awake alert able to give a decent history.  Clinical Impression   Pt is received in bed, she is agreeable to PT session with home care aide at bedside. Pt currently sees home health PT 1x/week and has a RN come to home for check ups. Vitals were assess throughout session with sats at 98% on 2L [place]. Pt performs bed mobility min A for safety and weakness. Pt able to sit EOB but demonstrates severe kyphosis limiting ability to respond to questions. Vitals were assessed during sitting EOB and steadily trended down to 92% on 2L . Pt reports L back pain at 8/10 NPS following sitting EOB. Home health care aide reported Pt has difficulty with eating and intake of medications due to kyphosis which impacts airway when in seated position. Pt would benefit from skilled PT to address above deficits and promote optimal return to PLOF.         If plan is discharge home, recommend the following: A lot of help with walking and/or transfers;A lot of help with bathing/dressing/bathroom;Direct supervision/assist for medications management;Assist for transportation;Assistance with feeding;Help with stairs or ramp for entrance   Can travel by private vehicle        Equipment Recommendations None recommended by PT   Recommendations for Other Services       Functional Status Assessment Patient has had a recent decline in their functional status and/or demonstrates limited ability to make significant improvements in function in a reasonable and predictable amount of time     Precautions / Restrictions Precautions Precautions: Fall Restrictions Weight Bearing Restrictions: No      Mobility  Bed Mobility Overal bed mobility: Needs Assistance Bed Mobility: Supine to Sit     Supine to sit: Min assist     General bed mobility comments: Able to initiate bed mobility but requires min A for sitting EOB    Transfers                   General transfer comment: NT at this time due to back pain    Ambulation/Gait               General Gait Details: NT at this time due to back pain  Stairs            Wheelchair Mobility     Tilt Bed    Modified Rankin (Stroke Patients Only)       Balance Overall balance assessment: Needs assistance Sitting-balance support: Bilateral upper extremity supported, Feet unsupported Sitting balance-Leahy Scale: Fair Sitting balance - Comments: Able to sustain seated balance with use of BUE on bed for support Postural control: Posterior lean     Standing balance comment: NT due to back pain  Pertinent Vitals/Pain Pain Assessment Pain Assessment: 0-10 Pain Score: 8  Pain Location: L back pain Pain Descriptors / Indicators: Aching, Constant, Sore, Guarding Pain Intervention(s): Limited activity within patient's tolerance, Monitored during session, Premedicated before session    Home Living Family/patient expects to be discharged to:: Private residence Living Arrangements: Alone Available Help at Discharge: Available 24 hours/day;Personal care attendant Type of Home: House Home Access: Stairs to enter Entrance Stairs-Rails: Right Entrance Stairs-Number of Steps: 2   Home Layout: One  level Home Equipment: Agricultural consultant (2 wheels);Cane - single point Additional Comments: Amb with RW around home and [facility] when getting into vehicle    Prior Function Prior Level of Function : Independent/Modified Independent             Mobility Comments: Has 24/7 assistance from home aide who assist with Pt care       Extremity/Trunk Assessment   Upper Extremity Assessment Upper Extremity Assessment: Overall WFL for tasks assessed    Lower Extremity Assessment Lower Extremity Assessment: Overall WFL for tasks assessed    Cervical / Trunk Assessment Cervical / Trunk Assessment: Kyphotic  Communication   Communication Communication: No apparent difficulties  Cognition Arousal: Alert Behavior During Therapy: WFL for tasks assessed/performed Overall Cognitive Status: Within Functional Limits for tasks assessed                                 General Comments: Pleasant and able to provide history intake        General Comments      Exercises     Assessment/Plan    PT Assessment Patient needs continued PT services  PT Problem List Decreased strength;Decreased range of motion;Decreased activity tolerance;Decreased balance;Decreased mobility;Decreased safety awareness;Pain       PT Treatment Interventions DME instruction;Gait training;Stair training;Functional mobility training;Therapeutic activities;Therapeutic exercise;Balance training    PT Goals (Current goals can be found in the Care Plan section)  Acute Rehab PT Goals Patient Stated Goal: to go home PT Goal Formulation: With patient Time For Goal Achievement: 07/21/23 Potential to Achieve Goals: Fair    Frequency Min 1X/week     Co-evaluation               AM-PAC PT "6 Clicks" Mobility  Outcome Measure Help needed turning from your back to your side while in a flat bed without using bedrails?: A Little Help needed moving from lying on your back to sitting on the side of a flat  bed without using bedrails?: A Lot Help needed moving to and from a bed to a chair (including a wheelchair)?: A Lot Help needed standing up from a chair using your arms (e.g., wheelchair or bedside chair)?: A Lot Help needed to walk in hospital room?: A Little Help needed climbing 3-5 steps with a railing? : A Lot 6 Click Score: 14    End of Session Equipment Utilized During Treatment: Oxygen Activity Tolerance: Patient limited by pain Patient left: in bed;with call bell/phone within reach;with family/visitor present Nurse Communication: Mobility status PT Visit Diagnosis: Repeated falls (R29.6);History of falling (Z91.81);Pain;Muscle weakness (generalized) (M62.81) Pain - Right/Left: Left Pain - part of body:  (back pain)    Time: 1191-4782 PT Time Calculation (min) (ACUTE ONLY): 13 min   Charges:                 Elmon Else, SPT     07/07/2023, 4:34 PM

## 2023-07-07 NOTE — ED Provider Notes (Signed)
[facility] Provider Note    Event Date/Time   First MD Initiated Contact with Patient 07/07/23 1009     (approximate)  History   Chief Complaint: Fall and Shortness of Breath  HPI  Olivia Lewis is a 89 y.o. female with a past medical history of anemia, CHF, CKD, gastric reflux, hypertension, hyperlipidemia, presents to the emergency department for shortness of breath.  According to report and patient she was seen in the emergency department yesterday following a fall.  Patient workup included CT images of her spine and head with no significant findings or traumatic findings.  Patient was discharged home but continues to complain of pain across the lower chest as well as shortness of breath.  Patient satting 88% on room air upon arrival with no baseline O2 requirement.  No fever.  No reported cough.  Patient is awake alert able to give a decent history.  Physical Exam   Triage Vital Signs: ED Triage Vitals  Encounter Vitals Group     BP 07/07/23 1026 131/83     Systolic BP Percentile --      Diastolic BP Percentile --      Pulse Rate 07/07/23 1026 66     Resp 07/07/23 1026 (!) 23     Temp 07/07/23 1026 98.3 F (36.8 C)     Temp Source 07/07/23 1026 Oral     SpO2 07/07/23 1025 (!) 88 %     Weight --      Height --      Head Circumference --      Peak Flow --      Pain Score 07/07/23 1026 4     Pain Loc --      Pain Education --      Exclude from Growth Chart --     Most recent vital signs: Vitals:   07/07/23 1025 07/07/23 1026  BP:  131/83  Pulse:  66  Resp:  (!) 23  Temp:  98.3 F (36.8 C)  SpO2: (!) 88% (!) 88%    General: Awake, no distress.  CV:  Good peripheral perfusion.  Regular rate and rhythm  Resp:  Normal effort.  Equal breath sounds bilaterally.  Mild tenderness across the lower ribs anteriorly bilaterally. Abd:  No distention.  Soft, nontender.  No rebound or guarding.  ED Results / Procedures / Treatments    RADIOLOGY  I have reviewed and interpreted the chest x-ray images.  I do not see any obvious pneumothorax on my evaluation. Radiologist read the CT scan as multiple right sided rib fractures kyphosis.   MEDICATIONS ORDERED IN ED: Medications - No data to display   IMPRESSION / MDM / ASSESSMENT AND PLAN / ED COURSE  I reviewed the triage vital signs and the nursing notes.  Patient's presentation is most consistent with acute presentation with potential threat to life or bodily function.  Patient presents to the emergency department for shortness of breath after a fall yesterday.  Patient's workup today shows a CT scan of the chest showing multiple right-sided rib fractures which I believe is likely the cause of the patient's shortness of breath and ultimately hypoxia.  Patient's blood work shows mild leukocytosis 14,000 however this is unchanged from blood work 1 month ago.  Patient's chemistry shows no significant findings.  However given the patient's new O2 requirement continued chest pain we will dose IV pain medication continue on nasal cannula oxygen and and admit to the hospital service for  further workup and treatment.  FINAL CLINICAL IMPRESSION(S) / ED DIAGNOSES   Dyspnea Rib fractures Fall   Note:  This document was prepared using Dragon voice recognition software and may include unintentional dictation errors.   Minna Antis, MD 07/07/23 1341

## 2023-07-07 NOTE — H&P (Signed)
History and Physical    Patient: Olivia Lewis UJW:[id number] DOB: 06-09-1929 DOA: 07/07/2023 DOS: the patient was seen and examined on 07/07/2023 PCP: Gracelyn Nurse, MD  Patient coming from: Home  Chief Complaint:  Chief Complaint  Patient presents with   Fall   Shortness of Breath   HPI: Olivia Lewis is a 89 y.o. female with medical history significant of HFpEF, CKD stage III, hypertension, hyperlipidemia, osteoporosis, hypothyroidism, and frequent falls who presents to the ED due to shortness of breath.  Olivia Lewis states that yesterday, she was walking with her walker to go lay down due to back pain.  She turned her head to let her home care aide know, and upon turning her head back, experience 1 or 2-second dizziness that led to her losing her balance and falling backwards.  She denies any other symptoms prior to the fall including chest pain, palpitations, shortness of breath, headache.  She notes that she fell backwards onto her back and hit the back of her head against the ground.  She denies any loss of consciousness.  Due to back pain, she laid on the ground and EMS was called.  At this time, she endorses some shortness of breath that has been improved by supplemental oxygen.  She endorses bilateral chest wall pain and back pain.  Per chart review, patient presented to the ED yesterday after a ground-level fall that was mechanical in nature.  Patient states she lost her balance and fell backwards hitting the back of her head.  Workup at that time demonstrated a subacute versus acute T3 compression fracture.  Neurosurgery consulted and recommended outpatient follow-up.  ED course: On arrival to the ED, patient was normotensive at 131/83 with heart rate of 66.  She is saturating at 88% and placed on 2 L with improvement to 98%.  She was afebrile at 98.3. Initial workup demonstrated WBC of 14.2, hemoglobin of 15.3,Glucose of 115, BUN 28, creatinine 1.1 with GFR 47.CT of the  chest was obtained that demonstratedMultiple acute right-sided mildly toPlaced fractures withNo evidenceWax.In addition, there are numerous older rib fractures, sternal fracture and compression deformities of the spine.   Review of Systems: As mentioned in the history of present illness. All other systems reviewed and are negative.  Past Medical History:  Diagnosis Date   Allergy    Anemia    Arthritis    B12 deficiency    Basal cell carcinoma    face, R arm, txted by Dr. Orson Aloe and [facility]   Cancer [facility])    skin--face and arms   Cataracts, bilateral    CHF (congestive heart failure) (HCC)    Chickenpox    CKD (chronic kidney disease), stage III (HCC)    Diverticulosis    GERD (gastroesophageal reflux disease)    Hyperlipidemia    Hypertension    Hypothyroidism    Osteoporosis    Squamous cell carcinoma of skin 05/01/2021   right distal tricep at elbow, left forearm near elbow, right dorsum hand, left mid lat forearm - all treated with EDC   Squamous cell carcinoma of skin 05/01/2021   right calf - EDC 07/14/21   Squamous cell carcinoma of skin 08/18/2021   R sup calf - ED&C   Squamous cell carcinoma of skin 12/29/2021   L mandible - ED&C   Squamous cell carcinoma of skin 12/29/2021   L lat elbow - ED&C   Squamous cell carcinoma of skin 12/29/2021   L lat tricep - ED&C  Squamous cell carcinoma of skin 05/11/2022   Right deltoid - EDC   Squamous cell carcinoma of skin 05/11/2022   Left forearm - in situ - EDC   Past Surgical History:  Procedure Laterality Date   BUNIONECTOMY Bilateral    CARDIAC CATHETERIZATION     CATARACT EXTRACTION Bilateral    DILATION AND CURETTAGE OF UTERUS     EYE SURGERY Bilateral    cataract  surgery   JOINT REPLACEMENT Right    total knee replacement   meniscus tear Right    multiple skin cancer removal     NEUROMA SURGERY Bilateral    bilateral feet   rotator cuff surgery Right    TOTAL KNEE ARTHROPLASTY Left 05/06/2015   Procedure:  TOTAL KNEE ARTHROPLASTY;  Surgeon: Kennedy Bucker, MD;  Location: [facility] ORS;  Service: Orthopedics;  Laterality: Left;   Social History:  reports that she has never smoked. She has never used smokeless tobacco. She reports that she does not drink alcohol and does not use drugs.  Allergies  Allergen Reactions   Penicillin V Potassium Other (See Comments)    Has patient had a PCN reaction causing immediate rash, facial/tongue/throat swelling, SOB or lightheadedness with hypotension: Unknown Has patient had a PCN reaction causing severe rash involving mucus membranes or skin necrosis: Unknown Has patient had a PCN reaction that required hospitalization: Unknown Has patient had a PCN reaction occurring within the last 10 years: No If all of the above answers are "NO", then may proceed with Cephalosporin use.    Sulfa Antibiotics Other (See Comments)    headache    Family History  Problem Relation Age of Onset   Hypertension Mother    Hypertension Father    Diabetes Sister    Breast cancer Sister    Colon cancer Sister    Bone cancer Brother     Prior to Admission medications   Medication Sig Start Date End Date Taking? Authorizing Provider  acetaminophen (TYLENOL) 500 MG tablet Take 1,000 mg by mouth daily as needed.     [provider]  aspirin EC 81 MG tablet Take 81 mg by mouth daily.     [provider]  atorvastatin (LIPITOR) 10 MG tablet Take 1 tablet by mouth daily. 07/21/21   [provider]  Calcium Carb-Cholecalciferol 500-200 MG-UNIT TABS Take 1 tablet by mouth daily.     [provider]  Cyanocobalamin (B-12 SL) Place 2,500 mcg under the tongue daily.     [provider]  docusate sodium (COLACE) 100 MG capsule Take 100 mg by mouth daily as needed.    [provider]  furosemide (LASIX) 20 MG tablet Take 20 mg by mouth daily. 12/31/22 12/31/23  [provider]  levothyroxine (SYNTHROID, LEVOTHROID) 50 MCG tablet Take  50 mcg by mouth daily at 6 (six) AM.    [provider]  lisinopril (ZESTRIL) 10 MG tablet Take 1 tablet (10 mg total) by mouth daily. 01/07/23 01/07/24  Georga Hacking, MD  loratadine (CLARITIN) 10 MG tablet Take 10 mg by mouth daily as needed for allergies. Patient not taking: Reported on 01/12/2023    [provider]  metoprolol succinate (TOPROL-XL) 25 MG 24 hr tablet Take 3 tablets (75 mg total) by mouth daily. 04/09/21   Arnetha Courser, MD  nystatin (MYCOSTATIN/NYSTOP) powder Apply 1 Application topically 3 (three) times daily. Patient not taking: Reported on 01/12/2023 01/08/23   Corena Herter, MD  nystatin-triamcinolone ointment [facility]) Apply 1 Application topically  2 (two) times daily. Patient not taking: Reported on 01/12/2023 01/08/23   Corena Herter, MD  potassium chloride SA (KLOR-CON M) 20 MEQ tablet Take 1 tablet (20 mEq total) by mouth daily. 01/12/23   Delma Freeze, FNP  traMADol (ULTRAM) 50 MG tablet Take 1 tablet (50 mg total) by mouth every 6 (six) hours as needed. Patient not taking: Reported on 01/12/2023 12/03/20   Chinita Pester, FNP    Physical Exam: Vitals:   07/07/23 1025 07/07/23 1026 07/07/23 1139 07/07/23 1412  BP:  131/83 131/80 129/64  Pulse:  66 60 63  Resp:  (!) 23 (!) 23 (!) 23  Temp:  98.3 F (36.8 C)  98.3 F (36.8 C)  TempSrc:  Oral  Oral  SpO2: (!) 88% (!) 88% 98% 95%   Physical Exam Vitals and nursing note reviewed.  Constitutional:      Appearance: She is normal weight.     Comments: Patient appears frail  HENT:     Head: Normocephalic and atraumatic.     Mouth/Throat:     Mouth: Mucous membranes are moist.     Pharynx: Oropharynx is clear.  Eyes:     Extraocular Movements: Extraocular movements intact.     Pupils: Pupils are equal, round, and reactive to light.  Cardiovascular:     Rate and Rhythm: Normal rate and regular rhythm.  Pulmonary:     Effort: Pulmonary effort is normal. No tachypnea.     Breath sounds:  Decreased breath sounds (Diminished throughout) present.  Chest:     Chest wall: Tenderness (Bilateral tenderness to palpation, left more than right) present.  Abdominal:     Palpations: Abdomen is soft.     Tenderness: There is no abdominal tenderness.  Musculoskeletal:     Right lower leg: No edema.     Left lower leg: No edema.  Skin:    General: Skin is warm and dry.  Neurological:     General: No focal deficit present.     Mental Status: She is alert and oriented to person, place, and time.  Psychiatric:        Mood and Affect: Mood normal.        Behavior: Behavior normal.    Data Reviewed: CBC with WBC of 14.2, hemoglobin of 15.3, platelets of 279 CMP with sodium of 139, potassium 4.5, bicarb 27, glucose 115, BUN 26, creat 1.10, and GFR 47  CT CHEST WO CONTRAST  Result Date: 07/07/2023 CLINICAL DATA:  Shortness of breath.  Pain after fall.  Hypoxia EXAM: CT CHEST WITHOUT CONTRAST TECHNIQUE: Multidetector CT imaging of the chest was performed following the standard protocol without IV contrast. RADIATION DOSE REDUCTION: This exam was performed according to the departmental dose-optimization program which includes automated exposure control, adjustment of the mA and/or kV according to patient size and/or use of iterative reconstruction technique. COMPARISON:  Chest x-ray 05/18/2023 FINDINGS: Cardiovascular: On this non IV contrast exam, the heart is borderline enlarged. Small pericardial effusion. Coronary artery calcifications are seen. The thoracic aorta has some scattered calcified plaque. The ascending aorta has a diameter 3.7 by 3.7 cm. There is also some enlargement of the central pulmonary arteries. Please correlate for any evidence of pulmonary artery hypertension. Evaluation for vascular injury is significantly limited without the advantage of IV contrast. No obvious mediastinal hematoma. Mediastinum/Nodes: There are some small less than 1 cm size but prominent mediastinal  lymph nodes, nonpathologic by size criteria. Evaluation of the hilum is limited without contrast.  No axillary lymph node enlargement. The thoracic esophagus is slightly patulous. Lungs/Pleura: Tiny bilateral pleural effusions. Adjacent lung opacities identified. Atelectasis is favored over infiltrate. Breathing motion identified. No pneumothorax. Upper Abdomen: Adrenal glands are preserved in the upper abdomen. Musculoskeletal: Please see separate dictation of CT thoracic and lumbar spine examinations. Severe kyphosis again noted of the upper thoracic spine with compression deformities diffuse degenerative changes identified. Old posterior right-sided rib fractures are identified. Left-sided chronic rib fractures are also seen. There is an acute fracture involving the lateral aspect of the right second, third, fourth, fifth and sixth ribs. Slight adjacent thickening. Note is also made of a sternal fracture which is incompletely healed but is potentially more subacute to chronic please correlate with the history IMPRESSION: Multiple acute right-sided mildly displaced rib fractures. No pneumothorax. Numerous other older rib fractures. There is sternal fracture and compression deformities of the spine with kyphosis. Please correlate with separate spine CT scans. Tiny pleural effusions with the adjacent parenchymal opacities. Atelectasis versus infiltrate. Recommend follow-up. Enlarged heart with mild dilatation of the aorta and pulmonary arteries. Aortic Atherosclerosis (ICD10-I70.0) and Emphysema (ICD10-J43.9). Electronically Signed   By: Karen Kays M.D.   On: 07/07/2023 11:47   DG Shoulder Left  Result Date: 07/06/2023 CLINICAL DATA:  Pain post fall EXAM: LEFT SHOULDER - 2+ VIEW COMPARISON:  None Available. FINDINGS: AC joint appears intact. Y-views are limited due to positioning. No definite dislocation. No definitive fracture. Slight narrowing of subacromial space which could be due to rotator cuff disease.  IMPRESSION: No definite acute osseous abnormality. Electronically Signed   By: Jasmine Pang M.D.   On: 07/06/2023 20:59   CT Thoracic Spine Wo Contrast  Result Date: 07/06/2023 CLINICAL DATA:  fall, thoracic spine TTP, evaluating for fracture; fall, L spine tenderness to palpation, evaluating for fractures EXAM: CT THORACIC AND LUMBAR SPINE WITHOUT CONTRAST TECHNIQUE: Multidetector CT imaging of the thoracic and lumbar spine was performed without contrast. Multiplanar CT image reconstructions were also generated. RADIATION DOSE REDUCTION: This exam was performed according to the departmental dose-optimization program which includes automated exposure control, adjustment of the mA and/or kV according to patient size and/or use of iterative reconstruction technique. COMPARISON:  CT C-spine 07/01/2022, chest x-ray 01/08/2023 FINDINGS: CT THORACIC SPINE FINDINGS Alignment: Exaggerated kyphotic curvature centered at the T3 level in the setting of compression fractures. Vertebrae: Subacute to chronic T11 right transverse process fracture. Chronic right T12 transverse process fracture. Chronic right T5 - T7 transverse process fracture. Chronic appearing compression fracture of the T1 level with 40% vertebral body height loss, T2 level with 50% vertebral body height loss, T3 level with up to 60% vertebral body height loss. 2 mm retropulsion into the central canal at the T3 level. No definite acute displaced fracture. Paraspinal and other soft tissues: Negative. Disc levels: Grossly maintained CT LUMBAR SPINE FINDINGS Segmentation: 5 lumbar type vertebrae. Alignment: Dextroscoliosis centered at the L2-L3 level. Vertebrae: Chronic right L1, L2, L3, L4 transverse process fracture. Moderate severe degenerative changes. No acute fracture or focal pathologic process. Paraspinal and other soft tissues: Negative. Disc levels: Multilevel moderate to severe intervertebral narrowing and vacuum phenomenon at the L1-L2, L2-L3  levels. Other: Atherosclerotic plaque.  At least 3 vessel coronary calcifications. Cardiomegaly peer Biapical pleural/pulmonary scarring. Trace right pleural effusion. Bilateral lower lobe consolidations. Old healed right rib fractures. Severe degenerative changes of the right shoulder with deformity of the right humeral head. IMPRESSION: CT THORACIC SPINE IMPRESSION 1. No definite acute displaced fracture or traumatic listhesis  of the thoracic spine. 2. Chronic appearing compression fracture of the T1 level with 40% vertebral body height loss, T2 level with 50% vertebral body height loss, T3 level with up to 60% vertebral body height loss. Associated decreased exaggerated kyphotic curvature. Correlate point tenderness to palpation to evaluate for an acute component. CT LUMBAR SPINE IMPRESSION 1. No acute displaced fracture or traumatic listhesis of the lumbar spine. Other imaging findings of potential clinical significance: 1. Trace right pleural effusion. Bilateral lower lobe atelectasis with superimposed infection not excluded-recommend correlation With chest x-ray. 2. Cardiomegaly. 3. Aortic Atherosclerosis (ICD10-I70.0) with at least 3 vessel coronary calcification. 4. Severe degenerative changes of the right shoulder. Electronically Signed   By: Tish Frederickson M.D.   On: 07/06/2023 19:50   CT Lumbar Spine Wo Contrast  Result Date: 07/06/2023 CLINICAL DATA:  fall, thoracic spine TTP, evaluating for fracture; fall, L spine tenderness to palpation, evaluating for fractures EXAM: CT THORACIC AND LUMBAR SPINE WITHOUT CONTRAST TECHNIQUE: Multidetector CT imaging of the thoracic and lumbar spine was performed without contrast. Multiplanar CT image reconstructions were also generated. RADIATION DOSE REDUCTION: This exam was performed according to the departmental dose-optimization program which includes automated exposure control, adjustment of the mA and/or kV according to patient size and/or use of iterative  reconstruction technique. COMPARISON:  CT C-spine 07/01/2022, chest x-ray 01/08/2023 FINDINGS: CT THORACIC SPINE FINDINGS Alignment: Exaggerated kyphotic curvature centered at the T3 level in the setting of compression fractures. Vertebrae: Subacute to chronic T11 right transverse process fracture. Chronic right T12 transverse process fracture. Chronic right T5 - T7 transverse process fracture. Chronic appearing compression fracture of the T1 level with 40% vertebral body height loss, T2 level with 50% vertebral body height loss, T3 level with up to 60% vertebral body height loss. 2 mm retropulsion into the central canal at the T3 level. No definite acute displaced fracture. Paraspinal and other soft tissues: Negative. Disc levels: Grossly maintained CT LUMBAR SPINE FINDINGS Segmentation: 5 lumbar type vertebrae. Alignment: Dextroscoliosis centered at the L2-L3 level. Vertebrae: Chronic right L1, L2, L3, L4 transverse process fracture. Moderate severe degenerative changes. No acute fracture or focal pathologic process. Paraspinal and other soft tissues: Negative. Disc levels: Multilevel moderate to severe intervertebral narrowing and vacuum phenomenon at the L1-L2, L2-L3 levels. Other: Atherosclerotic plaque.  At least 3 vessel coronary calcifications. Cardiomegaly peer Biapical pleural/pulmonary scarring. Trace right pleural effusion. Bilateral lower lobe consolidations. Old healed right rib fractures. Severe degenerative changes of the right shoulder with deformity of the right humeral head. IMPRESSION: CT THORACIC SPINE IMPRESSION 1. No definite acute displaced fracture or traumatic listhesis of the thoracic spine. 2. Chronic appearing compression fracture of the T1 level with 40% vertebral body height loss, T2 level with 50% vertebral body height loss, T3 level with up to 60% vertebral body height loss. Associated decreased exaggerated kyphotic curvature. Correlate point tenderness to palpation to evaluate for  an acute component. CT LUMBAR SPINE IMPRESSION 1. No acute displaced fracture or traumatic listhesis of the lumbar spine. Other imaging findings of potential clinical significance: 1. Trace right pleural effusion. Bilateral lower lobe atelectasis with superimposed infection not excluded-recommend correlation With chest x-ray. 2. Cardiomegaly. 3. Aortic Atherosclerosis (ICD10-I70.0) with at least 3 vessel coronary calcification. 4. Severe degenerative changes of the right shoulder. Electronically Signed   By: Tish Frederickson M.D.   On: 07/06/2023 19:50   CT Head Wo Contrast  Result Date: 07/06/2023 CLINICAL DATA:  Lost balance fell backwards and struck head. EXAM: CT HEAD  WITHOUT CONTRAST CT CERVICAL SPINE WITHOUT CONTRAST TECHNIQUE: Multidetector CT imaging of the head and cervical spine was performed following the standard protocol without intravenous contrast. Multiplanar CT image reconstructions of the cervical spine were also generated. RADIATION DOSE REDUCTION: This exam was performed according to the departmental dose-optimization program which includes automated exposure control, adjustment of the mA and/or kV according to patient size and/or use of iterative reconstruction technique. COMPARISON:  CT brain and cervical spine 05/18/2023, 07/01/2022 FINDINGS: CT HEAD FINDINGS Brain: No acute territorial infarction, hemorrhage or intracranial mass. Mild atrophy. Extensive white matter hypodensity. Stable ventricle size Vascular: No hyperdense vessels.  Carotid vascular calcification Skull: Normal. Negative for fracture or focal lesion. Sinuses/Orbits: No acute finding. Other: None CT CERVICAL SPINE FINDINGS Alignment: Limited by habitus, osteopenia and positioning. Trace anterolisthesis C4 on C5 and C5 on C6. Skull base and vertebrae: Cranio vertebral junction appears intact. The vertebral body heights are maintained. Possible chronic fracture deformity right lamina of C7. Chronic moderate severe compression  fracture of T2 as compared with June examination. Mild loss of height T1 vertebral body with suspected lucency through the vertebral body on sagittal views, series 6, image 29. Subacute to chronic left transverse process fracture at T2, series 8, image 79. Interval moderate severe compression fracture of T3 with close to 50% loss of vertebral body height. Acute to subacute appearing left transverse process fracture at T3, series 8, image 89. Soft tissues and spinal canal: No prevertebral fluid or swelling. No visible canal hematoma. Mild retropulsion of the inferior aspect of T3 vertebral body but without significant canal stenosis. Disc levels: Multilevel degenerative changes, worst at C5-C6, C6-C7. Upper chest: Negative. Other: None IMPRESSION: 1. No CT evidence for acute intracranial abnormality. Atrophy and extensive chronic small vessel ischemic changes of the white matter. 2. Limited cervical spine CT due to habitus, osteopenia and positioning. No definite acute osseous abnormality. 3. Interval moderate severe compression fracture of T3 with mild retropulsion but no significant canal stenosis, appearance suggests acute to subacute process. Acute to subacute appearing left transverse process fracture at T3. 4. Mild loss of height T1 vertebral body with suspected anterior to posterior lucency through the vertebral body on sagittal views, suspect acute to subacute fracture. 5. Subacute to chronic left transverse process fracture at T2. Chronic moderate severe compression fracture of T2 (when compared with CT from June 2024). Electronically Signed   By: Jasmine Pang M.D.   On: 07/06/2023 18:33   CT Cervical Spine Wo Contrast  Result Date: 07/06/2023 CLINICAL DATA:  Lost balance fell backwards and struck head. EXAM: CT HEAD WITHOUT CONTRAST CT CERVICAL SPINE WITHOUT CONTRAST TECHNIQUE: Multidetector CT imaging of the head and cervical spine was performed following the standard protocol without intravenous  contrast. Multiplanar CT image reconstructions of the cervical spine were also generated. RADIATION DOSE REDUCTION: This exam was performed according to the departmental dose-optimization program which includes automated exposure control, adjustment of the mA and/or kV according to patient size and/or use of iterative reconstruction technique. COMPARISON:  CT brain and cervical spine 05/18/2023, 07/01/2022 FINDINGS: CT HEAD FINDINGS Brain: No acute territorial infarction, hemorrhage or intracranial mass. Mild atrophy. Extensive white matter hypodensity. Stable ventricle size Vascular: No hyperdense vessels.  Carotid vascular calcification Skull: Normal. Negative for fracture or focal lesion. Sinuses/Orbits: No acute finding. Other: None CT CERVICAL SPINE FINDINGS Alignment: Limited by habitus, osteopenia and positioning. Trace anterolisthesis C4 on C5 and C5 on C6. Skull base and vertebrae: Cranio vertebral junction appears intact. The vertebral  body heights are maintained. Possible chronic fracture deformity right lamina of C7. Chronic moderate severe compression fracture of T2 as compared with June examination. Mild loss of height T1 vertebral body with suspected lucency through the vertebral body on sagittal views, series 6, image 29. Subacute to chronic left transverse process fracture at T2, series 8, image 79. Interval moderate severe compression fracture of T3 with close to 50% loss of vertebral body height. Acute to subacute appearing left transverse process fracture at T3, series 8, image 89. Soft tissues and spinal canal: No prevertebral fluid or swelling. No visible canal hematoma. Mild retropulsion of the inferior aspect of T3 vertebral body but without significant canal stenosis. Disc levels: Multilevel degenerative changes, worst at C5-C6, C6-C7. Upper chest: Negative. Other: None IMPRESSION: 1. No CT evidence for acute intracranial abnormality. Atrophy and extensive chronic small vessel ischemic  changes of the white matter. 2. Limited cervical spine CT due to habitus, osteopenia and positioning. No definite acute osseous abnormality. 3. Interval moderate severe compression fracture of T3 with mild retropulsion but no significant canal stenosis, appearance suggests acute to subacute process. Acute to subacute appearing left transverse process fracture at T3. 4. Mild loss of height T1 vertebral body with suspected anterior to posterior lucency through the vertebral body on sagittal views, suspect acute to subacute fracture. 5. Subacute to chronic left transverse process fracture at T2. Chronic moderate severe compression fracture of T2 (when compared with CT from June 2024). Electronically Signed   By: Jasmine Pang M.D.   On: 07/06/2023 18:33    There are no new results to review at this time.  Assessment and Plan:  * Acute hypoxic respiratory failure (HCC) Patient is presenting with increased shortness of breath, with desaturation between 85 to 88%, requiring 2 L of supplemental oxygen.  This is secondary to multiple rib fractures and associated pain, likely leading to atelectasis.  - Continuous pulse oximetry - Continue supplemental oxygen to maintain oxygen saturation above 88%  Multiple rib fractures CT imaging today demonstrates multiple rib fractures on the right with some displacement but no evidence of pneumothorax.  - Tylenol, oxycodone, and low-dose Dilaudid as needed for pain control - Incentive spirometry  Compression fracture of T3 vertebra (HCC) Patient was seen in the ED yesterday at which time imaging demonstrated a acute versus subacute T3 compression fracture.  Neurosurgery recommended outpatient follow-up only.  - Pain regimen as noted above - Outpatient follow-up with neurosurgery  OP (osteoporosis) History of osteoporosis with multiple fractures in the past. Unclear if patient has undergone previous treatment.   - Continue outpatient follow-up  PCP  Fall Patient has a history of recurrent falls that are often mechanical in nature.  She lives at home independently but has family and friends that can assist her 24/7.  Her most recent fall yesterday, sounds vestibular in nature, as she had a couple second dizziness after turning quickly and lost her balance.   - PT/OT  Dysphagia Patient reports increasing difficulty swallowing pills and prefers a soft diet.  She has not been evaluated by speech therapy in the past  - SLP evaluation - Dysphagia 3 diet for now  Monoclonal B-cell lymphocytosis of undetermined significance Patient's leukocytosis slightly increased compared to prior, but relatively stable otherwise.   - Continue outpatient follow up with Heme/Onc as indicated.  Atrial fibrillation (HCC) - Continue home regimen - Patient not on AC due to high risk falls  CKD (chronic kidney disease), stage IIIb Renal function currently at baseline.   -  Monitor renal function intermittently while admitted  Hypothyroidism - Continue home Synthroid  Advance Care Planning:   Code Status: DNR/DNI.  Patient states that she has previously signed a DO NOT RESUSCITATE form, but wonders if she should change back to a full code.  She notes that she has a sister that she is helping take care of and she would like to stay around to help her.  We discussed the details of CPR including the damage that comes along with it, including multiple rib fractures, sternal fractures, pulmonary contusions.  We discussed that given her frail state, she would have a very difficult time of coming off life support.  Patient's friend at bedside discussed with Mrs. Privett, and with shared decision making patient decided to stay as a DNR/DNI.  Consults: None  Family Communication: Patient's friend Dianna updated at bedside  Severity of Illness: The appropriate patient status for this patient is OBSERVATION. Observation status is judged to be reasonable and  necessary in order to provide the required intensity of service to ensure the patient's safety. The patient's presenting symptoms, physical exam findings, and initial radiographic and laboratory data in the context of their medical condition is felt to place them at decreased risk for further clinical deterioration. Furthermore, it is anticipated that the patient will be medically stable for discharge from the hospital within 2 midnights of admission.   Author: Verdene Lennert, MD 07/07/2023 2:57 PM  For on call review [email or web address].

## 2023-07-07 NOTE — Assessment & Plan Note (Addendum)
Patient has a history of recurrent falls that are often mechanical in nature.  She lives at home independently but has family and friends that can assist her 24/7.  Her most recent fall yesterday, sounds vestibular in nature, as she had a couple second dizziness after turning quickly and lost her balance.   - PT/OT

## 2023-07-07 NOTE — Assessment & Plan Note (Signed)
CT imaging today demonstrates multiple rib fractures on the right with some displacement but no evidence of pneumothorax.  - Tylenol, oxycodone, and low-dose Dilaudid as needed for pain control - Incentive spirometry

## 2023-07-07 NOTE — ED Triage Notes (Signed)
Pt arrives for SOB and fall yesterday. Pt seen for fall yesterday. Pt became short of breath last night and was 85% RA per EMS.

## 2023-07-07 NOTE — Assessment & Plan Note (Signed)
History of osteoporosis with multiple fractures in the past. Unclear if patient has undergone previous treatment.   - Continue outpatient follow-up PCP

## 2023-07-07 NOTE — Assessment & Plan Note (Signed)
Patient reports increasing difficulty swallowing pills and prefers a soft diet.  She has not been evaluated by speech therapy in the past  - SLP evaluation - Dysphagia 3 diet for now

## 2023-07-07 NOTE — Assessment & Plan Note (Signed)
Patient is presenting with increased shortness of breath, with desaturation between 85 to 88%, requiring 2 L of supplemental oxygen.  This is secondary to multiple rib fractures and associated pain, likely leading to atelectasis.  - Continuous pulse oximetry - Continue supplemental oxygen to maintain oxygen saturation above 88%

## 2023-07-07 NOTE — Assessment & Plan Note (Signed)
Patient was seen in the ED yesterday at which time imaging demonstrated a acute versus subacute T3 compression fracture.  Neurosurgery recommended outpatient follow-up only.  - Pain regimen as noted above - Outpatient follow-up with neurosurgery

## 2023-07-08 DIAGNOSIS — J9811 Atelectasis: Secondary | ICD-10-CM | POA: Diagnosis present

## 2023-07-08 DIAGNOSIS — Z66 Do not resuscitate: Secondary | ICD-10-CM | POA: Diagnosis present

## 2023-07-08 DIAGNOSIS — Z85828 Personal history of other malignant neoplasm of skin: Secondary | ICD-10-CM | POA: Diagnosis not present

## 2023-07-08 DIAGNOSIS — I5030 Unspecified diastolic (congestive) heart failure: Secondary | ICD-10-CM | POA: Insufficient documentation

## 2023-07-08 DIAGNOSIS — Z9842 Cataract extraction status, left eye: Secondary | ICD-10-CM | POA: Diagnosis not present

## 2023-07-08 DIAGNOSIS — Z9841 Cataract extraction status, right eye: Secondary | ICD-10-CM | POA: Diagnosis not present

## 2023-07-08 DIAGNOSIS — E039 Hypothyroidism, unspecified: Secondary | ICD-10-CM | POA: Diagnosis present

## 2023-07-08 DIAGNOSIS — I130 Hypertensive heart and chronic kidney disease with heart failure and stage 1 through stage 4 chronic kidney disease, or unspecified chronic kidney disease: Secondary | ICD-10-CM | POA: Diagnosis present

## 2023-07-08 DIAGNOSIS — Z8249 Family history of ischemic heart disease and other diseases of the circulatory system: Secondary | ICD-10-CM | POA: Diagnosis not present

## 2023-07-08 DIAGNOSIS — G8929 Other chronic pain: Secondary | ICD-10-CM | POA: Diagnosis present

## 2023-07-08 DIAGNOSIS — W010XXA Fall on same level from slipping, tripping and stumbling without subsequent striking against object, initial encounter: Secondary | ICD-10-CM | POA: Diagnosis present

## 2023-07-08 DIAGNOSIS — I272 Pulmonary hypertension, unspecified: Secondary | ICD-10-CM | POA: Insufficient documentation

## 2023-07-08 DIAGNOSIS — R54 Age-related physical debility: Secondary | ICD-10-CM | POA: Diagnosis present

## 2023-07-08 DIAGNOSIS — Z79890 Hormone replacement therapy (postmenopausal): Secondary | ICD-10-CM | POA: Diagnosis not present

## 2023-07-08 DIAGNOSIS — I4891 Unspecified atrial fibrillation: Secondary | ICD-10-CM | POA: Diagnosis present

## 2023-07-08 DIAGNOSIS — Y92009 Unspecified place in unspecified non-institutional (private) residence as the place of occurrence of the external cause: Secondary | ICD-10-CM | POA: Diagnosis not present

## 2023-07-08 DIAGNOSIS — E440 Moderate protein-calorie malnutrition: Secondary | ICD-10-CM | POA: Diagnosis present

## 2023-07-08 DIAGNOSIS — M800AXA Age-related osteoporosis with current pathological fracture, other site, initial encounter for fracture: Secondary | ICD-10-CM | POA: Diagnosis present

## 2023-07-08 DIAGNOSIS — Y9301 Activity, walking, marching and hiking: Secondary | ICD-10-CM | POA: Diagnosis present

## 2023-07-08 DIAGNOSIS — M25512 Pain in left shoulder: Secondary | ICD-10-CM | POA: Diagnosis present

## 2023-07-08 DIAGNOSIS — J9601 Acute respiratory failure with hypoxia: Secondary | ICD-10-CM | POA: Diagnosis present

## 2023-07-08 DIAGNOSIS — K219 Gastro-esophageal reflux disease without esophagitis: Secondary | ICD-10-CM | POA: Diagnosis present

## 2023-07-08 DIAGNOSIS — N1832 Chronic kidney disease, stage 3b: Secondary | ICD-10-CM | POA: Diagnosis present

## 2023-07-08 DIAGNOSIS — R1310 Dysphagia, unspecified: Secondary | ICD-10-CM | POA: Diagnosis present

## 2023-07-08 DIAGNOSIS — I5032 Chronic diastolic (congestive) heart failure: Secondary | ICD-10-CM | POA: Diagnosis present

## 2023-07-08 DIAGNOSIS — D72820 Lymphocytosis (symptomatic): Secondary | ICD-10-CM | POA: Diagnosis present

## 2023-07-08 DIAGNOSIS — R0902 Hypoxemia: Secondary | ICD-10-CM | POA: Diagnosis present

## 2023-07-08 DIAGNOSIS — M8088XA Other osteoporosis with current pathological fracture, vertebra(e), initial encounter for fracture: Secondary | ICD-10-CM | POA: Diagnosis present

## 2023-07-08 DIAGNOSIS — E785 Hyperlipidemia, unspecified: Secondary | ICD-10-CM | POA: Diagnosis present

## 2023-07-08 LAB — CBC WITH DIFFERENTIAL/PLATELET
Abs Immature Granulocytes: 0.07 10*3/uL (ref 0.00–0.07)
Basophils Absolute: 0.1 10*3/uL (ref 0.0–0.1)
Basophils Relative: 1 %
Eosinophils Absolute: 0.9 10*3/uL — ABNORMAL HIGH (ref 0.0–0.5)
Eosinophils Relative: 7 %
HCT: 41.2 % (ref 36.0–46.0)
Hemoglobin: 13.5 g/dL (ref 12.0–15.0)
Immature Granulocytes: 1 %
Lymphocytes Relative: 19 %
Lymphs Abs: 2.4 10*3/uL (ref 0.7–4.0)
MCH: 29.9 pg (ref 26.0–34.0)
MCHC: 32.8 g/dL (ref 30.0–36.0)
MCV: 91.2 fL (ref 80.0–100.0)
Monocytes Absolute: 1.6 10*3/uL — ABNORMAL HIGH (ref 0.1–1.0)
Monocytes Relative: 13 %
Neutro Abs: 7.8 10*3/uL — ABNORMAL HIGH (ref 1.7–7.7)
Neutrophils Relative %: 59 %
Platelets: 240 10*3/uL (ref 150–400)
RBC: 4.52 MIL/uL (ref 3.87–5.11)
RDW: 14.8 % (ref 11.5–15.5)
WBC: 12.8 10*3/uL — ABNORMAL HIGH (ref 4.0–10.5)
nRBC: 0 % (ref 0.0–0.2)

## 2023-07-08 LAB — GLUCOSE, CAPILLARY: Glucose-Capillary: 92 mg/dL (ref 70–99)

## 2023-07-08 LAB — TSH: TSH: 2.383 u[IU]/mL (ref 0.350–4.500)

## 2023-07-08 MED ORDER — SENNA 8.6 MG PO TABS
1.0000 | ORAL_TABLET | Freq: Every day | ORAL | Status: DC
  Administered 2023-07-08 – 2023-07-11 (×4): 8.6 mg via ORAL
  Filled 2023-07-08 (×4): qty 1

## 2023-07-08 MED ORDER — ADULT MULTIVITAMIN LIQUID CH
15.0000 mL | Freq: Every day | ORAL | Status: DC
  Administered 2023-07-09 – 2023-07-11 (×3): 15 mL via ORAL
  Filled 2023-07-08 (×4): qty 15

## 2023-07-08 MED ORDER — KETOROLAC TROMETHAMINE 15 MG/ML IJ SOLN
15.0000 mg | Freq: Three times a day (TID) | INTRAMUSCULAR | Status: DC
  Administered 2023-07-08 – 2023-07-09 (×3): 15 mg via INTRAVENOUS
  Filled 2023-07-08 (×3): qty 1

## 2023-07-08 MED ORDER — PANTOPRAZOLE SODIUM 40 MG PO TBEC
40.0000 mg | DELAYED_RELEASE_TABLET | Freq: Every day | ORAL | Status: DC
  Administered 2023-07-08 – 2023-07-11 (×3): 40 mg via ORAL
  Filled 2023-07-08 (×3): qty 1

## 2023-07-08 MED ORDER — ENOXAPARIN SODIUM 30 MG/0.3ML IJ SOSY
30.0000 mg | PREFILLED_SYRINGE | INTRAMUSCULAR | Status: DC
  Administered 2023-07-08 – 2023-07-11 (×4): 30 mg via SUBCUTANEOUS
  Filled 2023-07-08 (×4): qty 0.3

## 2023-07-08 MED ORDER — ENSURE ENLIVE PO LIQD
237.0000 mL | Freq: Three times a day (TID) | ORAL | Status: DC
  Administered 2023-07-08 – 2023-07-11 (×8): 237 mL via ORAL

## 2023-07-08 NOTE — Progress Notes (Signed)
Physical Therapy Treatment Patient Details Name: Olivia Lewis MRN: [id number] DOB: 1929/09/30 Today's Date: 07/08/2023   History of Present Illness Pt is a 89 y.o. female with a past medical history of anemia, CHF, CKD, gastric reflux, hypertension, hyperlipidemia, presents to the emergency department for shortness of breath.  According to report and patient she was seen in the EDt yesterday following a fall.  Patient workup included CT images of her spine and head with no significant findings or traumatic findings.  Patient was discharged home but continues to complain of pain across the lower chest as well as SOB.  Patient started on 2L [place] due to desat.  No fever.  No reported cough.  Patient is awake alert able to give a decent history.    PT Comments  Pt is steadily progressing towards PT goals. Pt is received in bed, she is agreeable to PT session. Pt reports mod mid back pain that has not changed since yesterday (07/07/23). Assessed vitals throughout session with sats maintaining at 91%-97% 2L [place]. Pt performs bed mobility min A x2 due to weakness and pain limitation, and transfers with use of RW min A x1 for safety. Pt demonstrates prominent posterior lean when seated EOB that was able to improve with time but requires BUE support. Pt able to initiate steps during step pivot transfer to recliner with cuing for safety. Pt cont to have difficulty with talking while in a seated position but improves with repositioning. Spoke with Pt and caregiver regarding discharge disposition update. Secure chat care team regarding change of discharge disposition. Pt would benefit from skilled PT to address above deficits and promote optimal return to PLOF.    If plan is discharge home, recommend the following: A lot of help with walking and/or transfers;A lot of help with bathing/dressing/bathroom;Direct supervision/assist for medications management;Assist for transportation;Assistance with feeding;Help with  stairs or ramp for entrance   Can travel by private vehicle     No  Equipment Recommendations  None recommended by PT    Recommendations for Other Services       Precautions / Restrictions Precautions Precautions: Fall Restrictions Weight Bearing Restrictions: No     Mobility  Bed Mobility Overal bed mobility: Needs Assistance Bed Mobility: Supine to Sit     Supine to sit: Mod assist, +2 for physical assistance     General bed mobility comments: Able to initiate bed mobility but requires mod A x2 for sitting EOB    Transfers Overall transfer level: Needs assistance Equipment used: Rolling walker (2 wheels) Transfers: Sit to/from Stand, Bed to chair/wheelchair/BSC Sit to Stand: Min assist   Step pivot transfers: Min assist       General transfer comment: Able to scoot EOB with min cuing and initiate STS with use of RW.    Ambulation/Gait               General Gait Details: NT at this time due to back pain   Stairs             Wheelchair Mobility     Tilt Bed    Modified Rankin (Stroke Patients Only)       Balance Overall balance assessment: Needs assistance Sitting-balance support: Bilateral upper extremity supported, Feet supported Sitting balance-Leahy Scale: Fair Sitting balance - Comments: min A x2 to sustain seated balance with use of BUE on bed for support with prominent post lean but able to progress to min A x1 with time Postural control: Posterior lean Standing  balance support: Bilateral upper extremity supported, During functional activity, Reliant on assistive device for balance Standing balance-Leahy Scale: Good Standing balance comment: Able to maintain standing balance CGA for safety                            Cognition Arousal: Alert Behavior During Therapy: WFL for tasks assessed/performed Overall Cognitive Status: Within Functional Limits for tasks assessed                                  General Comments: Pleasant and willing to attempt mobility        Exercises      General Comments        Pertinent Vitals/Pain Pain Assessment Pain Assessment: 0-10 Pain Score: 5  Pain Location: L back pain and L arm soreness Pain Descriptors / Indicators: Aching, Constant, Sore, Guarding Pain Intervention(s): Monitored during session, Premedicated before session, Limited activity within patient's tolerance    Home Living                          Prior Function            PT Goals (current goals can now be found in the care plan section) Acute Rehab PT Goals Patient Stated Goal: to go home PT Goal Formulation: With patient Time For Goal Achievement: 07/21/23 Potential to Achieve Goals: Fair Progress towards PT goals: Progressing toward goals    Frequency    Min 1X/week      PT Plan      Co-evaluation              AM-PAC PT "6 Clicks" Mobility   Outcome Measure  Help needed turning from your back to your side while in a flat bed without using bedrails?: A Lot Help needed moving from lying on your back to sitting on the side of a flat bed without using bedrails?: A Lot Help needed moving to and from a bed to a chair (including a wheelchair)?: A Little Help needed standing up from a chair using your arms (e.g., wheelchair or bedside chair)?: A Little Help needed to walk in hospital room?: A Lot Help needed climbing 3-5 steps with a railing? : Total 6 Click Score: 13    End of Session Equipment Utilized During Treatment: Oxygen Activity Tolerance: Patient limited by pain;Patient limited by fatigue Patient left: with call bell/phone within reach;with family/visitor present;in chair Nurse Communication: Mobility status PT Visit Diagnosis: Repeated falls (R29.6);History of falling (Z91.81);Pain;Muscle weakness (generalized) (M62.81) Pain - Right/Left: Left Pain - part of body: Arm (And mid back)     Time: 3664-4034 PT Time Calculation  (min) (ACUTE ONLY): 25 min  Charges:                            Elmon Else, SPT    Panfilo Ketchum 07/08/2023, 12:08 PM

## 2023-07-08 NOTE — Evaluation (Signed)
Clinical/Bedside Swallow Evaluation Patient Details  Name: Olivia Lewis MRN: [id number] Date of Birth: 1929-08-31  Today's Date: 07/08/2023 Time: SLP Start Time (ACUTE ONLY): 1020 SLP Stop Time (ACUTE ONLY): 1110 SLP Time Calculation (min) (ACUTE ONLY): 50 min  Past Medical History:  Past Medical History:  Diagnosis Date   Allergy    Anemia    Arthritis    B12 deficiency    Basal cell carcinoma    face, R arm, txted by Dr. Orson Aloe and [facility]   Cancer [facility])    skin--face and arms   Cataracts, bilateral    CHF (congestive heart failure) (HCC)    Chickenpox    CKD (chronic kidney disease), stage III (HCC)    Diverticulosis    GERD (gastroesophageal reflux disease)    Hyperlipidemia    Hypertension    Hypothyroidism    Osteoporosis    Squamous cell carcinoma of skin 05/01/2021   right distal tricep at elbow, left forearm near elbow, right dorsum hand, left mid lat forearm - all treated with EDC   Squamous cell carcinoma of skin 05/01/2021   right calf - EDC 07/14/21   Squamous cell carcinoma of skin 08/18/2021   R sup calf - ED&C   Squamous cell carcinoma of skin 12/29/2021   L mandible - ED&C   Squamous cell carcinoma of skin 12/29/2021   L lat elbow - ED&C   Squamous cell carcinoma of skin 12/29/2021   L lat tricep - ED&C   Squamous cell carcinoma of skin 05/11/2022   Right deltoid - EDC   Squamous cell carcinoma of skin 05/11/2022   Left forearm - in situ - EDC   Past Surgical History:  Past Surgical History:  Procedure Laterality Date   BUNIONECTOMY Bilateral    CARDIAC CATHETERIZATION     CATARACT EXTRACTION Bilateral    DILATION AND CURETTAGE OF UTERUS     EYE SURGERY Bilateral    cataract  surgery   JOINT REPLACEMENT Right    total knee replacement   meniscus tear Right    multiple skin cancer removal     NEUROMA SURGERY Bilateral    bilateral feet   rotator cuff surgery Right    TOTAL KNEE ARTHROPLASTY Left 05/06/2015   Procedure: TOTAL KNEE  ARTHROPLASTY;  Surgeon: Kennedy Bucker, MD;  Location: [facility] ORS;  Service: Orthopedics;  Laterality: Left;   HPI:  Pt is a 89 y.o. female with a past medical history of Kyphosis ("sometimes wear a neck brace at home"), anemia, CHF, CKD, Gastric Reflux, afib, OP, hypertension, hyperlipidemia, presents to the emergency department for shortness of breath.  According to report and patient she was seen in the EDt yesterday following a fall.  Patient workup included CT images of her spine and head with no significant findings or traumatic findings.  Patient was discharged home but continues to complain of pain across the lower chest as well as SOB.  Patient started on 2L [place] due to desat.  No fever.  No reported cough.  Patient is awake alert able to give a decent history.  Per chart, pt states "that yesterday, she was walking with her walker to go lay down due to back pain.  She turned her head to let her home care aide know, and upon turning her head back, experience 1 or 2-second dizziness that led to her losing her balance and falling backwards.  She denies any other symptoms prior to the fall including chest pain, palpitations, shortness of breath,  headache.".  Workup includes: Multiple rib fractures; compression fracture of T3 vertebra.  Pt endorses discomfort "all over".   Chest CT: Tiny bilateral pleural effusions. Adjacent lung  opacities identified. Atelectasis is favored over infiltrate.  Multiple acute right-sided mildly displaced rib fractures. No  pneumothorax.     Numerous other older rib fractures. There is sternal fracture and  compression deformities of the spine with Kyphosis.    Assessment / Plan / Recommendation  Clinical Impression    Pt seen for BSE this morning. Pt A/O x4; engaged easily. Pt awake, but drowsy. Immediately noted Baseline Kyphotic positioning (both in bed and in chair when stopping in for Ed at [employer]). This positioning appears to impact her breath support for speech; min  decreased volume and shorter sentences/phrases. Pt also has min gravely vocal quality -- she endorsed Active "Acid Reflux burning my throat right now". MD made aware and ordered PPI. Pt A/O x3; followed basic commands.   On [place] O2 support- 2L; afebrile, WBC trending down.    OF NOTE: Pt strongly endorses s/s of REFLUX at home. She reports episodes "my throat burning" and having to spit up/out increased phlegm(white, bubbly, stringy). Noted min gravely vocal quality.   Pt appears to present w/ functional oropharyngeal phase swallowing w/ No overt oropharyngeal phase dysphagia appreciated during oral intake of trials; No neuromuscular swallowing deficits appreciated. Pt appears at reduced risk for aspiration from an oropharyngeal phase standpoint following general aspiration precautions. HOWEVER, pt has a baseline presentation of REFLUX and episodes of REFLUX behavior. She is not on a PPI. ANY Dysmotility or Regurgitation of Reflux material can increase risk for aspiration of the Reflux material during Retrograde flow thus impact Voicing and Pulmonary status. Pt also has Kyphosis Baseline impacting her positioning during oral intake.     Pt was positioning and given head support in as optimal a midline/upright position possible in setting of Kyphosis. She consumed several trials of thin liquids Via Cup/Straw and purees w/ No immediate, overt clinical s/s of aspiration noted; clear vocal quality b/t trials, no cough, no decline in pulmonary status, no multiple swallows noted post initial pharyngeal swallow, no decline in O2 sats(97%). Oral phase appeared [facility] for bolus management and timely A-P transfer/clearing of material. No solids given in setting of pt's comorbidities -- recommend Time for recovery b/f returning to usual oral diet. OM exam was [facility] for oral clearing; lingual/labial movements. No unilateral weakness. Speech clear.    Recommend a more Pureed diet (moistened w/ gravies) w/ thin liquids --  monitor straw use. General aspiration precautions. Support w/ positioning and feeding at meals. Rest Breaks during meals/oral intake to allow for Esophageal clearing. REFLUX precautions strongly recommended to lessen chance for Regurgitation -- remain upright for ~1 hour post meals. Pills CRUSHED in PUREE.  Strongly suspect discomfort from Rib fxs, Kyphotic positioning, and acid Reflux will have an impact on oral intake at this time. Using a PUREED diet consistency currently during recovery will offer benefit of Least Restrictive oral consistency diet for optimal intake/nutrition for healing. Fully expect pt to be able to return to her usual oral diet consistency post Time for medical improvement of her Acute medical issues.  MD updated on recommendation for PPI for pt -- this was discussed w/ pt also.   Recommend pt f/u w/ GI for assessment/management of REFLUX and tx as indicated. Discussion w/ pt on general aspiration and Reflux precautions. Discussed the impact of Kyphosis on both breathing and swallowing also. Encouraged  her to monitor her s/s REFLUX, and foods/diet, and update her MDs as needed for support for the Reflux. MD to reconsult ST services if any new needs while admitted. F/u at D/C as indicated by Physician. Recommended Dietician f/u for support -- pt has begun drinking "more Ensure" at home per her report. NSG updated. Pt appreciative of Education information. SLP Visit Diagnosis: Dysphagia, unspecified (R13.10) (discomfort from Rib fxs, Kyphotic positioning, and acid reflux impact suspected)    Aspiration Risk  Mild aspiration risk;Risk for inadequate nutrition/hydration (reduced following precs.)    Diet Recommendation   Thin;Dysphagia 1 (puree) (to upgrade back to her usual diet post medical recovery time; improved positioning in a chair) = a more Pureed diet (moistened w/ gravies) w/ thin liquids -- monitor straw use. General aspiration precautions. Support w/ positioning and feeding  at meals. Rest Breaks during meals/oral intake to allow for Esophageal clearing. REFLUX precautions strongly recommended to lessen chance for Regurgitation -- remain upright for ~1 hour post meals.  Strongly suspect discomfort from Rib fxs, Kyphotic positioning, and acid Reflux will have an impact on oral intake at this time. Using a PUREED diet consistency currently during recovery will offer benefit of Least Restrictive oral consistency diet for optimal intake/nutrition for healing. Fully expect pt to be able to return to her usual oral diet consistency post Time for medical improvement of her Acute medical issues.   Medication Administration: Crushed with puree (as able)    Other  Recommendations Recommended Consults:  (Dietician f/u) Oral Care Recommendations: Oral care BID;Oral care before and after PO;Staff/trained caregiver to provide oral care (support)    Recommendations for follow up therapy are one component of a multi-disciplinary discharge planning process, led by the attending physician.  Recommendations may be updated based on patient status, additional functional criteria and insurance authorization.  Follow up Recommendations Follow physician's recommendations for discharge plan and follow up therapies      Assistance Recommended at Discharge  full  Functional Status Assessment Patient has had a recent decline in their functional status and/or demonstrates limited ability to make significant improvements in function in a reasonable and predictable amount of time  Frequency and Duration  (n/a)   (n/a)       Prognosis Prognosis for improved oropharyngeal function: Fair Barriers to Reach Goals: Time post onset;Severity of deficits Barriers/Prognosis Comment: discomfort from Rib fxs, Kyphotic positioning, and acid reflux impact suspected      Swallow Study   General Date of Onset: 07/06/23 HPI: Pt is a 89 y.o. female with a past medical history of Kyphosis ("sometimes wear a  neck brace at home"), anemia, CHF, CKD, Gastric Reflux, afib, OP, hypertension, hyperlipidemia, presents to the emergency department for shortness of breath.  According to report and patient she was seen in the EDt yesterday following a fall.  Patient workup included CT images of her spine and head with no significant findings or traumatic findings.  Patient was discharged home but continues to complain of pain across the lower chest as well as SOB.  Patient started on 2L [place] due to desat.  No fever.  No reported cough.  Patient is awake alert able to give a decent history.  Per chart, pt states "that yesterday, she was walking with her walker to go lay down due to back pain.  She turned her head to let her home care aide know, and upon turning her head back, experience 1 or 2-second dizziness that led to her losing her balance  and falling backwards.  She denies any other symptoms prior to the fall including chest pain, palpitations, shortness of breath, headache.".  Workup includes: Multiple rib fractures; compression fracture of T3 vertebra.  Pt endorses discomfort "all over".   Chest CT: Tiny bilateral pleural effusions. Adjacent lung  opacities identified. Atelectasis is favored over infiltrate.  Multiple acute right-sided mildly displaced rib fractures. No  pneumothorax.     Numerous other older rib fractures. There is sternal fracture and  compression deformities of the spine with Kyphosis. Type of Study: Bedside Swallow Evaluation Previous Swallow Assessment: none Diet Prior to this Study: Dysphagia 3 (mechanical soft);Thin liquids (Level 0) Temperature Spikes Noted: No (wbc trending down) Respiratory Status: Nasal cannula (2L) History of Recent Intubation: No Behavior/Cognition: Alert;Cooperative;Pleasant mood (Kyphosis) Oral Cavity Assessment: Within Functional Limits Oral Care Completed by SLP: Recent completion by staff Oral Cavity - Dentition: Adequate natural dentition Vision: Functional for  self-feeding Self-Feeding Abilities: Able to feed self;Needs set up Patient Positioning: Upright in bed;Postural control adequate for testing (Kyphosis) Baseline Vocal Quality: Low vocal intensity (gravely - positioning and acid reflux impact suspected) Volitional Cough: Strong Volitional Swallow: Able to elicit    Oral/Motor/Sensory Function Overall Oral Motor/Sensory Function: Within functional limits   Ice Chips Ice chips: Within functional limits Presentation: Spoon (fed; 2 trials)   Thin Liquid Thin Liquid: Within functional limits Presentation: Cup;Self Fed;Straw (5 trials via each method)    Nectar Thick Nectar Thick Liquid: Not tested   Honey Thick Honey Thick Liquid: Not tested   Puree Puree: Within functional limits Presentation: Self Fed;Spoon (suported; 9 trials)   Solid     Solid: Not tested        Jerilynn Som, MS, CCC-SLP Speech Language Pathologist Rehab Services; [facility] [phone] (ascom) Deeandra Jerry 07/08/2023,1:43 PM

## 2023-07-08 NOTE — TOC Initial Note (Signed)
Transition of Care [facility]) - Initial/Assessment Note    Patient Details  Name: Olivia Lewis MRN: [id number] Date of Birth: [birth year]  Transition of Care [facility]) CM/SW Contact:    Liliana Cline, LCSW Phone Number: 07/08/2023, 2:23 PM  Clinical Narrative:                 Met with patient at bedside. Quincy Carnes also present.  Patient is from home alone. PCP is Dr. Letitia Libra. Patient is agreeable to SNF, she wants to review her bed offers before making a decision.  SNF work up started.   Expected Discharge Plan: Skilled Nursing Facility Barriers to Discharge: Continued Medical Work up   Patient Goals and CMS Choice Patient states their goals for this hospitalization and ongoing recovery are:: SNF CMS Medicare.gov Compare Post Acute Care list provided to:: Patient Choice offered to / list presented to : Patient      Expected Discharge Plan and Services       Living arrangements for the past 2 months: Single Family Home                                      Prior Living Arrangements/Services Living arrangements for the past 2 months: Single Family Home Lives with:: Self Patient language and need for interpreter reviewed:: Yes Do you feel safe going back to the place where you live?: Yes      Need for Family Participation in Patient Care: Yes (Comment) Care giver support system in place?: Yes (comment) Current home services: DME Criminal Activity/Legal Involvement Pertinent to Current Situation/Hospitalization: No - Comment as needed  Activities of Daily Living Home Assistive Devices/Equipment: Cane (specify quad or straight), Walker (specify type) ADL Screening (condition at time of admission) Patient's cognitive ability adequate to safely complete daily activities?: Yes Is the patient deaf or have difficulty hearing?: Yes Does the patient have difficulty seeing, even when wearing glasses/contacts?: No Does the patient have difficulty concentrating,  remembering, or making decisions?: No Patient able to express need for assistance with ADLs?: Yes Does the patient have difficulty dressing or bathing?: Yes Independently performs ADLs?: Yes (appropriate for developmental age) Does the patient have difficulty walking or climbing stairs?: Yes Weakness of Legs: Both Weakness of Arms/Hands: Both  Permission Sought/Granted Permission sought to share information with : Oceanographer granted to share information with : Yes, Verbal Permission Granted     Permission granted to share info w AGENCY: SNFs        Emotional Assessment       Orientation: : Oriented to Self, Oriented to Situation, Oriented to Place, Oriented to  Time Alcohol / Substance Use: Not Applicable Psych Involvement: No (comment)  Admission diagnosis:  Hypoxia [R09.02] Fall, subsequent encounter [W19.XXXD] Closed fracture of multiple ribs of right side, initial encounter [S22.41XA] Acute hypoxic respiratory failure (HCC) [J96.01] Patient Active Problem List   Diagnosis Date Noted   (HFpEF) heart failure with preserved ejection fraction (HCC) 07/08/2023   Pulmonary hypertension (HCC) 07/08/2023   Acute hypoxic respiratory failure (HCC) 07/07/2023   Multiple rib fractures 07/07/2023   Compression fracture of T3 vertebra (HCC) 07/07/2023   Dysphagia 07/07/2023   Pain due to onychomycosis of toenails of both feet 12/10/2021   Osteoarthritis (arthritis due to wear and tear of joints) 08/13/2021   Hyperlipidemia 08/13/2021   Elevated troponin 04/08/2021   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  Paroxysmal atrial fibrillation (HCC) 10/21/2020   Fall    Leukocytosis    Acute kidney injury superimposed on CKD [facility])    Atrial fibrillation (HCC) 10/11/2020   Atrial fibrillation with rapid ventricular response (HCC) 10/10/2020   B12 deficiency 04/07/2016   Acid reflux 04/07/2016   HLD (hyperlipidemia)  04/07/2016   Essential hypertension 04/07/2016   Arthritis, degenerative 04/07/2016   OP (osteoporosis) 04/07/2016   Primary osteoarthritis of knee 05/06/2015   Hypothyroidism 02/25/2015   CKD (chronic kidney disease), stage IIIb 02/25/2015   Calcium blood increased 02/25/2015   Hypercalcemia 02/25/2015   CKD (chronic kidney disease) stage 3, GFR 30-59 ml/min (HCC) 02/25/2015   Baker's cyst of knee 05/30/2014   PCP:  Gracelyn Nurse, MD Pharmacy:   [facility] - [place], [place] - [address] [place] [address] Phone: [phone] Fax: [phone]     Social Determinants of Health (SDOH) Social History: SDOH Screenings   Food Insecurity: No Food Insecurity (07/07/2023)  Housing: Low Risk  (07/07/2023)  Transportation Needs: No Transportation Needs (07/07/2023)  Utilities: Not At Risk (07/07/2023)  Tobacco Use: Low Risk  (07/06/2023)   SDOH Interventions:     Readmission Risk Interventions     No data to display

## 2023-07-08 NOTE — Care Management Obs Status (Signed)
MEDICARE OBSERVATION STATUS NOTIFICATION   Patient Details  Name: Olivia Lewis MRN: [id number] Date of Birth: Mar 24, 1929   Medicare Observation Status Notification Given:  Yes   Reviewed with patient at bedside, provided copy for patients records.    Toniann Dickerson E Maryrose Colvin, LCSW 07/08/2023, 2:18 PM

## 2023-07-08 NOTE — Progress Notes (Signed)
Initial Nutrition Assessment  DOCUMENTATION CODES:   Non-severe (moderate) malnutrition in context of chronic illness  INTERVENTION:   -Ensure Enlive po TID, each supplement provides 350 kcal and 20 grams of protein -Magic cup TID with meals, each supplement provides 290 kcal and 9 grams of protein  -Liquid MVI daily (pt prefers liquid medications over crushed pills)   NUTRITION DIAGNOSIS:   Moderate Malnutrition related to chronic illness (CHF) as evidenced by mild fat depletion, moderate fat depletion, mild muscle depletion, moderate muscle depletion, edema, percent weight loss.  GOAL:   Patient will meet greater than or equal to 90% of their needs  MONITOR:   PO intake, Supplement acceptance  REASON FOR ASSESSMENT:   Consult Poor PO  ASSESSMENT:   Pt with medical history significant of HFpEF, CKD stage III, hypertension, hyperlipidemia, osteoporosis, hypothyroidism, and frequent falls who presents due to shortness of breath.  Pt admitted with rib fracture s/p mechanical fall.   8/16- s/p BSE- dysphagia 1 diet with thin liquids  Reviewed I/O's: +100 ml x 24 hours  Pt lying in recliner chair at time of visit. Pt was drowsy at time of visit, but awaken for RD. History obtain by pt and caregiver at bedside. Pt reports decreased oral intake over the past 2 weeks secondary to difficulty swallowing. Per caregiver, PTA pt had increased difficulty swallowing, even water. Pt has been having difficulty taking medications, but does ok when they are crushed in applesauce or in liquids form. Pt consumed a few liquids medications, but it was very laborious for her to do so. Noted meal completions 25%.   Pt pt was eating a few times per day, but was mostly grazing. Pt was focusing on eating favorite foods as well as food that she liked. Typical meal would be a small piece of a sub sandwich from church gathering or a half of a chik fil a chicken sandwich. Pt admits that she really likes  chocolate Ensure and ice cream and consumes these without difficulty.   Per pt, she is unsure of UBW, pt estimates she has lost about 10# in the past month. Reviewed wt hx; pt has experienced a 14.2% wt loss over the past 6 months, which is significant for time frame. Pt also with moderate edema, which is likely masking further weight gain as well as fat and muscle depletions.   Discussed importance of good meal and supplement intake to promote healing. Pt amenable to supplements.   Medications reviewed and include senokot.   Lab Results  Component Value Date   HGBA1C 6.1 (H) 04/09/2021   PTA DM medications are none.   Labs reviewed: CBGS: 92 (inpatient orders for glycemic control are none).    NUTRITION - FOCUSED PHYSICAL EXAM:  Flowsheet Row Most Recent Value  Orbital Region Mild depletion  Upper Arm Region Moderate depletion  Thoracic and Lumbar Region No depletion  Buccal Region Mild depletion  Temple Region No depletion  Clavicle Bone Region Mild depletion  Clavicle and Acromion Bone Region Mild depletion  Scapular Bone Region Mild depletion  Dorsal Hand Moderate depletion  Patellar Region Mild depletion  Anterior Thigh Region Mild depletion  Posterior Calf Region Mild depletion  Edema (RD Assessment) Moderate  Hair Reviewed  Eyes Reviewed  Mouth Reviewed  Skin Reviewed  Nails Reviewed       Diet Order:   Diet Order             DIET - DYS 1 Room service appropriate? Yes with Assist;  Fluid consistency: Thin  Diet effective now                   EDUCATION NEEDS:   Education needs have been addressed  Skin:  Skin Assessment: Reviewed RN Assessment  Last BM:  07/07/23  Height:   Ht Readings from Last 1 Encounters:  07/07/23 5' (1.524 m)    Weight:   Wt Readings from Last 1 Encounters:  07/07/23 57.6 kg    Ideal Body Weight:  45.5 kg  BMI:  Body mass index is 24.8 kg/m.  Estimated Nutritional Needs:   Kcal:  1700-1900  Protein:  90-105  grams  Fluid:  > 1.7 L    Levada Schilling, RD, LDN, CDCES Registered Dietitian II Certified Diabetes Care and Education Specialist Please refer to [facility] for RD and/or RD on-call/weekend/after hours pager

## 2023-07-08 NOTE — Evaluation (Signed)
Occupational Therapy Evaluation Patient Details Name: Olivia Lewis MRN: [id number] DOB: 04-13-1929 Today's Date: 07/08/2023   History of Present Illness Pt is a 89 y.o. female with a past medical history of anemia, CHF, CKD, gastric reflux, hypertension, hyperlipidemia, presents to the emergency department for shortness of breath.  According to report and patient she was seen in the EDt yesterday following a fall.  Patient workup included CT images of her spine and head with no significant findings or traumatic findings.  Patient was discharged home but continues to complain of pain across the lower chest as well as SOB.  Patient started on 2L [place] due to desat.  No fever.  No reported cough.  Patient is awake alert able to give a decent history.   Clinical Impression   Pt was seen for OT evaluation this date. Prior to hospital admission, pt reports she was indep with basic ADL tasks primarily and had a 24/7 caregiver who would assist with whatever she needed. Pt reports using RW in the home and [facility] for car transfers at baseline, prior to recent fall. Pt presents to acute OT demonstrating impaired ADL performance and functional mobility 2/2 significant back and rib pain, decreased strength, balance, and activity tolerance (See OT problem list for additional functional deficits). Pt currently requires MOD-MAX A for bed mobility and unable to maintain static sitting balance while EOB 2/2 pain. Pt severely kyphotic negatively impacting her speech and self feeding abilities. Pt required setup of drink with straw and positioning of tray with food to improve access using dom R hand. Pt noted feeling need to cough/clear her throat with food/drink since fall. Pt encouraged to wait for rest of breakfast meal for SLP to assess. SLP notified at end of session. Anticipate pt requires MAX A for LB ADL tasks, MIN-MOD A for seated UB ADL Tasks. Pt would benefit from skilled OT services to address noted impairments and  functional limitations (see below for any additional details) in order to maximize safety and independence while minimizing falls risk and caregiver burden.     If plan is discharge home, recommend the following: A lot of help with walking and/or transfers;A lot of help with bathing/dressing/bathroom;Assist for transportation;Assistance with cooking/housework;Help with stairs or ramp for entrance;Assistance with feeding    Functional Status Assessment  Patient has had a recent decline in their functional status and demonstrates the ability to make significant improvements in function in a reasonable and predictable amount of time.  Equipment Recommendations  Other (comment) (defer to next venue)    Recommendations for Other Services       Precautions / Restrictions Precautions Precautions: Fall Restrictions Weight Bearing Restrictions: No      Mobility Bed Mobility Overal bed mobility: Needs Assistance Bed Mobility: Supine to Sit, Sit to Supine     Supine to sit: Mod assist Sit to supine: Max assist   General bed mobility comments: MOD A to come to sit EOB but pt unable to maintain 2/2 pain and returned to supine    Transfers                   General transfer comment: deferred 2/2 pain      Balance Overall balance assessment: Needs assistance Sitting-balance support: Bilateral upper extremity supported, Feet supported Sitting balance-Leahy Scale: Poor Sitting balance - Comments: Pt required MOD A to maintain static sitting, pain limited  ADL either performed or assessed with clinical judgement   ADL Overall ADL's : Needs assistance/impaired Eating/Feeding: Minimal assistance Eating/Feeding Details (indicate cue type and reason): long sitting in bed, pt required MIN A for bringing small bite to mouth with spoon, pt nodded when asked if she felt like she needed to clear her throat or cough and pt nodded yes but  that it would hurt her ribs too much. SLP notified.                                         Vision         Perception         Praxis         Pertinent Vitals/Pain Pain Assessment Pain Assessment: 0-10 Pain Score: 7  Pain Location: L back pain and L arm soreness Pain Descriptors / Indicators: Aching, Constant, Sore, Guarding Pain Intervention(s): Limited activity within patient's tolerance, Monitored during session, Premedicated before session, Repositioned     Extremity/Trunk Assessment Upper Extremity Assessment Upper Extremity Assessment: Overall WFL for tasks assessed   Lower Extremity Assessment Lower Extremity Assessment: Generalized weakness   Cervical / Trunk Assessment Cervical / Trunk Assessment: Kyphotic   Communication Communication Communication: Difficulty communicating thoughts/reduced clarity of speech   Cognition Arousal: Alert Behavior During Therapy: WFL for tasks assessed/performed Overall Cognitive Status: Within Functional Limits for tasks assessed                                 General Comments: Pleasant and willing to participate     General Comments       Exercises     Shoulder Instructions      Home Living Family/patient expects to be discharged to:: Private residence Living Arrangements: Alone Available Help at Discharge: Available 24 hours/day;Personal care attendant Type of Home: House Home Access: Stairs to enter Secretary/administrator of Steps: 2 Entrance Stairs-Rails: Right Home Layout: One level     Bathroom Shower/Tub: Producer, television/film/video: Handicapped height Bathroom Accessibility: Yes   Home Equipment: Agricultural consultant (2 wheels);Cane - single point   Additional Comments: Amb with RW around home and [facility] when getting into vehicle      Prior Functioning/Environment Prior Level of Function : Independent/Modified Independent;History of Falls (last six months)              Mobility Comments: Has 24/7 assistance from home aide who assist with Pt care ADLs Comments: Pt notes she was able to complete dressing and sink baths without direct assist prior to fall        OT Problem List: Decreased strength;Cardiopulmonary status limiting activity;Pain;Decreased range of motion;Decreased activity tolerance;Decreased knowledge of use of DME or AE;Impaired balance (sitting and/or standing)      OT Treatment/Interventions: Self-care/ADL training;Therapeutic exercise;Therapeutic activities;DME and/or AE instruction;Patient/family education;Balance training    OT Goals(Current goals can be found in the care plan section) Acute Rehab OT Goals Patient Stated Goal: go home OT Goal Formulation: With patient Time For Goal Achievement: 07/22/23 Potential to Achieve Goals: Good ADL Goals Pt Will Perform Eating: sitting;with set-up;with supervision Pt Will Perform Grooming: sitting;with min assist Pt Will Transfer to Toilet: with min assist;bedside commode;stand pivot transfer (LRAD) Pt Will Perform Toileting - Clothing Manipulation and hygiene: sitting/lateral leans;with min assist  OT Frequency: Min 1X/week  Co-evaluation              AM-PAC OT "6 Clicks" Daily Activity     Outcome Measure Help from another person eating meals?: A Little Help from another person taking care of personal grooming?: A Little Help from another person toileting, which includes using toliet, bedpan, or urinal?: Total Help from another person bathing (including washing, rinsing, drying)?: A Lot Help from another person to put on and taking off regular upper body clothing?: A Lot Help from another person to put on and taking off regular lower body clothing?: A Lot 6 Click Score: 13   End of Session Nurse Communication: Mobility status;Other (comment) (pain)  Activity Tolerance: Patient limited by pain Patient left: in bed;with call bell/phone within reach;with bed alarm  set  OT Visit Diagnosis: Other abnormalities of gait and mobility (R26.89);Repeated falls (R29.6);Muscle weakness (generalized) (M62.81);Pain Pain - part of body:  (back, ribs)                Time: 7253-6644 OT Time Calculation (min): 33 min Charges:  OT General Charges $OT Visit: 1 Visit OT Evaluation $OT Eval Moderate Complexity: 1 Mod OT Treatments $Self Care/Home Management : 8-22 mins  Arman Filter., MPH, MS, OTR/L ascom [phone] 07/08/23, 2:11 PM

## 2023-07-08 NOTE — Progress Notes (Signed)
PROGRESS NOTE    Olivia Lewis  UEA:[id number] DOB: 01/18/1929 DOA: 07/07/2023 PCP: Gracelyn Nurse, MD     Brief Narrative:   Olivia Lewis is a 89 y.o. female with medical history significant of HFpEF, CKD stage III, hypertension, hyperlipidemia, osteoporosis, hypothyroidism, and frequent falls who presents to the ED due to shortness of breath.   [name] states that yesterday, she was walking with her walker to go lay down due to back pain.  She turned her head to let her home care aide know, and upon turning her head back, experience 1 or 2-second dizziness that led to her losing her balance and falling backwards.  She denies any other symptoms prior to the fall including chest pain, palpitations, shortness of breath, headache.  She notes that she fell backwards onto her back and hit the back of her head against the ground.  She denies any loss of consciousness.  Due to back pain, she laid on the ground and EMS was called.  At this time, she endorses some shortness of breath that has been improved by supplemental oxygen.  She endorses bilateral chest wall pain and back pain.   Per chart review, patient presented to the ED yesterday after a ground-level fall that was mechanical in nature.  Patient states she lost her balance and fell backwards hitting the back of her head.  Workup at that time demonstrated a subacute versus acute T3 compression fracture.  Neurosurgery consulted and recommended outpatient follow-up.   Assessment & Plan:   Principal Problem:   Acute hypoxic respiratory failure (HCC) Active Problems:   Multiple rib fractures   Compression fracture of T3 vertebra (HCC)   OP (osteoporosis)   Fall   Dysphagia   Hypothyroidism   CKD (chronic kidney disease), stage IIIb   Essential hypertension   Atrial fibrillation (HCC)   Monoclonal B-cell lymphocytosis of undetermined significance   (HFpEF) heart failure with preserved ejection fraction (HCC)   Pulmonary  hypertension (HCC)  # Rib fractures Mildly displaced fractures of right ribs 2-6 after mechanical fall at home 2 days ago. PT advising SNF - pain control toradol q8, oxy/dilaudid prn - IC - miralax/senna  # Acute hypoxic respiratory failure Likely 2/2 to the above. No signs infection on CT - O2  # Subacute thoracic compression fracture Seen on CT 8/14, neurosurgery consulted by EDP, per report advised outpt f/u  # Osteoporosis - outpt f/u  # Dysphagia - SLP consult  # A-fib Rate controlled. Has declined anticoagulation - cont home metop  # CKD 3b Stable - monitor  # Hypothyroid - cont home synthroid - f/u tsh  # HTN Bp controlled - cont home lisinopril, aspirin    DVT prophylaxis: lovenox Code Status: dnr Family Communication: friend wilma cochran updated telephonically 8/16  Level of care: Med-Surg Status is: Observation    Consultants:  none  Procedures: none  Antimicrobials:  none    Subjective: Reports rib pain worse on right  Objective: Vitals:   07/07/23 1639 07/07/23 2332 07/08/23 0508 07/08/23 0754  BP: (!) 179/64 (!) 153/79 (!) 156/75 (!) 142/78  Pulse: 67 67 65 (!) 59  Resp:  18  17  Temp: 98.2 F (36.8 C) 97.8 F (36.6 C)  97.8 F (36.6 C)  TempSrc: Oral     SpO2: 97% 97% 95% 95%  Weight: 57.6 kg     Height: 5' (1.524 m)      No intake or output data in the 24 hours  ending 07/08/23 1046 Filed Weights   07/07/23 1639  Weight: 57.6 kg    Examination:  General exam: kyphotic Respiratory system: rales at bases otherwise clear Cardiovascular system: S1 & S2 heard, RRR. No JVD, murmurs, rubs, gallops or clicks. No pedal edema. Gastrointestinal system: Abdomen is nondistended, soft and nontender. No organomegaly or masses felt. Normal bowel sounds heard. Central nervous system: Alert and oriented. No focal neurological deficits. Extremities: Symmetric 5 x 5 power. Skin: No rashes, lesions or ulcers Psychiatry: Judgement and  insight appear normal. Mood & affect appropriate.     Data Reviewed: I have personally reviewed following labs and imaging studies  CBC: Recent Labs  Lab 07/07/23 1032 07/08/23 0301  WBC 14.2* 12.8*  NEUTROABS  --  7.8*  HGB 15.3* 13.5  HCT 46.1* 41.2  MCV 89.3 91.2  PLT 279 240   Basic Metabolic Panel: Recent Labs  Lab 07/07/23 1032  NA 139  K 4.5  CL 103  CO2 27  GLUCOSE 115*  BUN 26*  CREATININE 1.10*  CALCIUM 9.0   GFR: Estimated Creatinine Clearance: 25.9 mL/min (A) (by C-G formula based on SCr of 1.1 mg/dL (H)). Liver Function Tests: Recent Labs  Lab 07/07/23 1032  AST 40  ALT 19  ALKPHOS 73  BILITOT 1.4*  PROT 6.5  ALBUMIN 3.5   No results for input(s): "LIPASE", "AMYLASE" in the last 168 hours. No results for input(s): "AMMONIA" in the last 168 hours. Coagulation Profile: No results for input(s): "INR", "PROTIME" in the last 168 hours. Cardiac Enzymes: No results for input(s): "CKTOTAL", "CKMB", "CKMBINDEX", "TROPONINI" in the last 168 hours. BNP (last 3 results) No results for input(s): "PROBNP" in the last 8760 hours. HbA1C: No results for input(s): "HGBA1C" in the last 72 hours. CBG: No results for input(s): "GLUCAP" in the last 168 hours. Lipid Profile: No results for input(s): "CHOL", "HDL", "LDLCALC", "TRIG", "CHOLHDL", "LDLDIRECT" in the last 72 hours. Thyroid Function Tests: No results for input(s): "TSH", "T4TOTAL", "FREET4", "T3FREE", "THYROIDAB" in the last 72 hours. Anemia Panel: No results for input(s): "VITAMINB12", "FOLATE", "FERRITIN", "TIBC", "IRON", "RETICCTPCT" in the last 72 hours. Urine analysis:    Component Value Date/Time   COLORURINE YELLOW (A) 01/08/2023 1734   APPEARANCEUR HAZY (A) 01/08/2023 1734   LABSPEC 1.023 01/08/2023 1734   PHURINE 5.0 01/08/2023 1734   GLUCOSEU NEGATIVE 01/08/2023 1734   HGBUR NEGATIVE 01/08/2023 1734   BILIRUBINUR NEGATIVE 01/08/2023 1734   KETONESUR NEGATIVE 01/08/2023 1734    PROTEINUR 30 (A) 01/08/2023 1734   NITRITE POSITIVE (A) 01/08/2023 1734   LEUKOCYTESUR NEGATIVE 01/08/2023 1734   Sepsis Labs: @LABRCNTIP (procalcitonin:4,lacticidven:4)  )No results found for this or any previous visit (from the past 240 hour(s)).       Radiology Studies: CT CHEST WO CONTRAST  Result Date: 07/07/2023 CLINICAL DATA:  Shortness of breath.  Pain after fall.  Hypoxia EXAM: CT CHEST WITHOUT CONTRAST TECHNIQUE: Multidetector CT imaging of the chest was performed following the standard protocol without IV contrast. RADIATION DOSE REDUCTION: This exam was performed according to the departmental dose-optimization program which includes automated exposure control, adjustment of the mA and/or kV according to patient size and/or use of iterative reconstruction technique. COMPARISON:  Chest x-ray 05/18/2023 FINDINGS: Cardiovascular: On this non IV contrast exam, the heart is borderline enlarged. Small pericardial effusion. Coronary artery calcifications are seen. The thoracic aorta has some scattered calcified plaque. The ascending aorta has a diameter 3.7 by 3.7 cm. There is also some enlargement of the central pulmonary  arteries. Please correlate for any evidence of pulmonary artery hypertension. Evaluation for vascular injury is significantly limited without the advantage of IV contrast. No obvious mediastinal hematoma. Mediastinum/Nodes: There are some small less than 1 cm size but prominent mediastinal lymph nodes, nonpathologic by size criteria. Evaluation of the hilum is limited without contrast. No axillary lymph node enlargement. The thoracic esophagus is slightly patulous. Lungs/Pleura: Tiny bilateral pleural effusions. Adjacent lung opacities identified. Atelectasis is favored over infiltrate. Breathing motion identified. No pneumothorax. Upper Abdomen: Adrenal glands are preserved in the upper abdomen. Musculoskeletal: Please see separate dictation of CT thoracic and lumbar spine  examinations. Severe kyphosis again noted of the upper thoracic spine with compression deformities diffuse degenerative changes identified. Old posterior right-sided rib fractures are identified. Left-sided chronic rib fractures are also seen. There is an acute fracture involving the lateral aspect of the right second, third, fourth, fifth and sixth ribs. Slight adjacent thickening. Note is also made of a sternal fracture which is incompletely healed but is potentially more subacute to chronic please correlate with the history IMPRESSION: Multiple acute right-sided mildly displaced rib fractures. No pneumothorax. Numerous other older rib fractures. There is sternal fracture and compression deformities of the spine with kyphosis. Please correlate with separate spine CT scans. Tiny pleural effusions with the adjacent parenchymal opacities. Atelectasis versus infiltrate. Recommend follow-up. Enlarged heart with mild dilatation of the aorta and pulmonary arteries. Aortic Atherosclerosis (ICD10-I70.0) and Emphysema (ICD10-J43.9). Electronically Signed   By: Karen Kays M.D.   On: 07/07/2023 11:47   DG Shoulder Left  Result Date: 07/06/2023 CLINICAL DATA:  Pain post fall EXAM: LEFT SHOULDER - 2+ VIEW COMPARISON:  None Available. FINDINGS: AC joint appears intact. Y-views are limited due to positioning. No definite dislocation. No definitive fracture. Slight narrowing of subacromial space which could be due to rotator cuff disease. IMPRESSION: No definite acute osseous abnormality. Electronically Signed   By: Jasmine Pang M.D.   On: 07/06/2023 20:59   CT Thoracic Spine Wo Contrast  Result Date: 07/06/2023 CLINICAL DATA:  fall, thoracic spine TTP, evaluating for fracture; fall, L spine tenderness to palpation, evaluating for fractures EXAM: CT THORACIC AND LUMBAR SPINE WITHOUT CONTRAST TECHNIQUE: Multidetector CT imaging of the thoracic and lumbar spine was performed without contrast. Multiplanar CT image  reconstructions were also generated. RADIATION DOSE REDUCTION: This exam was performed according to the departmental dose-optimization program which includes automated exposure control, adjustment of the mA and/or kV according to patient size and/or use of iterative reconstruction technique. COMPARISON:  CT C-spine 07/01/2022, chest x-ray 01/08/2023 FINDINGS: CT THORACIC SPINE FINDINGS Alignment: Exaggerated kyphotic curvature centered at the T3 level in the setting of compression fractures. Vertebrae: Subacute to chronic T11 right transverse process fracture. Chronic right T12 transverse process fracture. Chronic right T5 - T7 transverse process fracture. Chronic appearing compression fracture of the T1 level with 40% vertebral body height loss, T2 level with 50% vertebral body height loss, T3 level with up to 60% vertebral body height loss. 2 mm retropulsion into the central canal at the T3 level. No definite acute displaced fracture. Paraspinal and other soft tissues: Negative. Disc levels: Grossly maintained CT LUMBAR SPINE FINDINGS Segmentation: 5 lumbar type vertebrae. Alignment: Dextroscoliosis centered at the L2-L3 level. Vertebrae: Chronic right L1, L2, L3, L4 transverse process fracture. Moderate severe degenerative changes. No acute fracture or focal pathologic process. Paraspinal and other soft tissues: Negative. Disc levels: Multilevel moderate to severe intervertebral narrowing and vacuum phenomenon at the L1-L2, L2-L3 levels. Other: Atherosclerotic  plaque.  At least 3 vessel coronary calcifications. Cardiomegaly peer Biapical pleural/pulmonary scarring. Trace right pleural effusion. Bilateral lower lobe consolidations. Old healed right rib fractures. Severe degenerative changes of the right shoulder with deformity of the right humeral head. IMPRESSION: CT THORACIC SPINE IMPRESSION 1. No definite acute displaced fracture or traumatic listhesis of the thoracic spine. 2. Chronic appearing compression  fracture of the T1 level with 40% vertebral body height loss, T2 level with 50% vertebral body height loss, T3 level with up to 60% vertebral body height loss. Associated decreased exaggerated kyphotic curvature. Correlate point tenderness to palpation to evaluate for an acute component. CT LUMBAR SPINE IMPRESSION 1. No acute displaced fracture or traumatic listhesis of the lumbar spine. Other imaging findings of potential clinical significance: 1. Trace right pleural effusion. Bilateral lower lobe atelectasis with superimposed infection not excluded-recommend correlation With chest x-ray. 2. Cardiomegaly. 3. Aortic Atherosclerosis (ICD10-I70.0) with at least 3 vessel coronary calcification. 4. Severe degenerative changes of the right shoulder. Electronically Signed   By: Tish Frederickson M.D.   On: 07/06/2023 19:50   CT Lumbar Spine Wo Contrast  Result Date: 07/06/2023 CLINICAL DATA:  fall, thoracic spine TTP, evaluating for fracture; fall, L spine tenderness to palpation, evaluating for fractures EXAM: CT THORACIC AND LUMBAR SPINE WITHOUT CONTRAST TECHNIQUE: Multidetector CT imaging of the thoracic and lumbar spine was performed without contrast. Multiplanar CT image reconstructions were also generated. RADIATION DOSE REDUCTION: This exam was performed according to the departmental dose-optimization program which includes automated exposure control, adjustment of the mA and/or kV according to patient size and/or use of iterative reconstruction technique. COMPARISON:  CT C-spine 07/01/2022, chest x-ray 01/08/2023 FINDINGS: CT THORACIC SPINE FINDINGS Alignment: Exaggerated kyphotic curvature centered at the T3 level in the setting of compression fractures. Vertebrae: Subacute to chronic T11 right transverse process fracture. Chronic right T12 transverse process fracture. Chronic right T5 - T7 transverse process fracture. Chronic appearing compression fracture of the T1 level with 40% vertebral body height loss,  T2 level with 50% vertebral body height loss, T3 level with up to 60% vertebral body height loss. 2 mm retropulsion into the central canal at the T3 level. No definite acute displaced fracture. Paraspinal and other soft tissues: Negative. Disc levels: Grossly maintained CT LUMBAR SPINE FINDINGS Segmentation: 5 lumbar type vertebrae. Alignment: Dextroscoliosis centered at the L2-L3 level. Vertebrae: Chronic right L1, L2, L3, L4 transverse process fracture. Moderate severe degenerative changes. No acute fracture or focal pathologic process. Paraspinal and other soft tissues: Negative. Disc levels: Multilevel moderate to severe intervertebral narrowing and vacuum phenomenon at the L1-L2, L2-L3 levels. Other: Atherosclerotic plaque.  At least 3 vessel coronary calcifications. Cardiomegaly peer Biapical pleural/pulmonary scarring. Trace right pleural effusion. Bilateral lower lobe consolidations. Old healed right rib fractures. Severe degenerative changes of the right shoulder with deformity of the right humeral head. IMPRESSION: CT THORACIC SPINE IMPRESSION 1. No definite acute displaced fracture or traumatic listhesis of the thoracic spine. 2. Chronic appearing compression fracture of the T1 level with 40% vertebral body height loss, T2 level with 50% vertebral body height loss, T3 level with up to 60% vertebral body height loss. Associated decreased exaggerated kyphotic curvature. Correlate point tenderness to palpation to evaluate for an acute component. CT LUMBAR SPINE IMPRESSION 1. No acute displaced fracture or traumatic listhesis of the lumbar spine. Other imaging findings of potential clinical significance: 1. Trace right pleural effusion. Bilateral lower lobe atelectasis with superimposed infection not excluded-recommend correlation With chest x-ray. 2. Cardiomegaly. 3. Aortic  Atherosclerosis (ICD10-I70.0) with at least 3 vessel coronary calcification. 4. Severe degenerative changes of the right shoulder.  Electronically Signed   By: Tish Frederickson M.D.   On: 07/06/2023 19:50   CT Head Wo Contrast  Result Date: 07/06/2023 CLINICAL DATA:  Lost balance fell backwards and struck head. EXAM: CT HEAD WITHOUT CONTRAST CT CERVICAL SPINE WITHOUT CONTRAST TECHNIQUE: Multidetector CT imaging of the head and cervical spine was performed following the standard protocol without intravenous contrast. Multiplanar CT image reconstructions of the cervical spine were also generated. RADIATION DOSE REDUCTION: This exam was performed according to the departmental dose-optimization program which includes automated exposure control, adjustment of the mA and/or kV according to patient size and/or use of iterative reconstruction technique. COMPARISON:  CT brain and cervical spine 05/18/2023, 07/01/2022 FINDINGS: CT HEAD FINDINGS Brain: No acute territorial infarction, hemorrhage or intracranial mass. Mild atrophy. Extensive white matter hypodensity. Stable ventricle size Vascular: No hyperdense vessels.  Carotid vascular calcification Skull: Normal. Negative for fracture or focal lesion. Sinuses/Orbits: No acute finding. Other: None CT CERVICAL SPINE FINDINGS Alignment: Limited by habitus, osteopenia and positioning. Trace anterolisthesis C4 on C5 and C5 on C6. Skull base and vertebrae: Cranio vertebral junction appears intact. The vertebral body heights are maintained. Possible chronic fracture deformity right lamina of C7. Chronic moderate severe compression fracture of T2 as compared with June examination. Mild loss of height T1 vertebral body with suspected lucency through the vertebral body on sagittal views, series 6, image 29. Subacute to chronic left transverse process fracture at T2, series 8, image 79. Interval moderate severe compression fracture of T3 with close to 50% loss of vertebral body height. Acute to subacute appearing left transverse process fracture at T3, series 8, image 89. Soft tissues and spinal canal: No  prevertebral fluid or swelling. No visible canal hematoma. Mild retropulsion of the inferior aspect of T3 vertebral body but without significant canal stenosis. Disc levels: Multilevel degenerative changes, worst at C5-C6, C6-C7. Upper chest: Negative. Other: None IMPRESSION: 1. No CT evidence for acute intracranial abnormality. Atrophy and extensive chronic small vessel ischemic changes of the white matter. 2. Limited cervical spine CT due to habitus, osteopenia and positioning. No definite acute osseous abnormality. 3. Interval moderate severe compression fracture of T3 with mild retropulsion but no significant canal stenosis, appearance suggests acute to subacute process. Acute to subacute appearing left transverse process fracture at T3. 4. Mild loss of height T1 vertebral body with suspected anterior to posterior lucency through the vertebral body on sagittal views, suspect acute to subacute fracture. 5. Subacute to chronic left transverse process fracture at T2. Chronic moderate severe compression fracture of T2 (when compared with CT from June 2024). Electronically Signed   By: Jasmine Pang M.D.   On: 07/06/2023 18:33   CT Cervical Spine Wo Contrast  Result Date: 07/06/2023 CLINICAL DATA:  Lost balance fell backwards and struck head. EXAM: CT HEAD WITHOUT CONTRAST CT CERVICAL SPINE WITHOUT CONTRAST TECHNIQUE: Multidetector CT imaging of the head and cervical spine was performed following the standard protocol without intravenous contrast. Multiplanar CT image reconstructions of the cervical spine were also generated. RADIATION DOSE REDUCTION: This exam was performed according to the departmental dose-optimization program which includes automated exposure control, adjustment of the mA and/or kV according to patient size and/or use of iterative reconstruction technique. COMPARISON:  CT brain and cervical spine 05/18/2023, 07/01/2022 FINDINGS: CT HEAD FINDINGS Brain: No acute territorial infarction,  hemorrhage or intracranial mass. Mild atrophy. Extensive white matter hypodensity. Stable  ventricle size Vascular: No hyperdense vessels.  Carotid vascular calcification Skull: Normal. Negative for fracture or focal lesion. Sinuses/Orbits: No acute finding. Other: None CT CERVICAL SPINE FINDINGS Alignment: Limited by habitus, osteopenia and positioning. Trace anterolisthesis C4 on C5 and C5 on C6. Skull base and vertebrae: Cranio vertebral junction appears intact. The vertebral body heights are maintained. Possible chronic fracture deformity right lamina of C7. Chronic moderate severe compression fracture of T2 as compared with June examination. Mild loss of height T1 vertebral body with suspected lucency through the vertebral body on sagittal views, series 6, image 29. Subacute to chronic left transverse process fracture at T2, series 8, image 79. Interval moderate severe compression fracture of T3 with close to 50% loss of vertebral body height. Acute to subacute appearing left transverse process fracture at T3, series 8, image 89. Soft tissues and spinal canal: No prevertebral fluid or swelling. No visible canal hematoma. Mild retropulsion of the inferior aspect of T3 vertebral body but without significant canal stenosis. Disc levels: Multilevel degenerative changes, worst at C5-C6, C6-C7. Upper chest: Negative. Other: None IMPRESSION: 1. No CT evidence for acute intracranial abnormality. Atrophy and extensive chronic small vessel ischemic changes of the white matter. 2. Limited cervical spine CT due to habitus, osteopenia and positioning. No definite acute osseous abnormality. 3. Interval moderate severe compression fracture of T3 with mild retropulsion but no significant canal stenosis, appearance suggests acute to subacute process. Acute to subacute appearing left transverse process fracture at T3. 4. Mild loss of height T1 vertebral body with suspected anterior to posterior lucency through the vertebral body  on sagittal views, suspect acute to subacute fracture. 5. Subacute to chronic left transverse process fracture at T2. Chronic moderate severe compression fracture of T2 (when compared with CT from June 2024). Electronically Signed   By: Jasmine Pang M.D.   On: 07/06/2023 18:33        Scheduled Meds:  acetaminophen (TYLENOL) oral liquid 160 mg/5 mL  650 mg Oral Q6H   aspirin EC  81 mg Oral Daily   levothyroxine  50 mcg Oral Q0600   lisinopril  10 mg Oral Daily   metoprolol succinate  75 mg Oral Daily   sodium chloride flush  3 mL Intravenous Q12H   Continuous Infusions:   LOS: 0 days     Silvano Bilis, MD [facility]   If 7PM-7AM, please contact night-coverage [email or web address] Password [facility] 07/08/2023, 10:46 AM

## 2023-07-08 NOTE — [place] FL2 (Signed)
[place] MEDICAID FL2 LEVEL OF CARE FORM     IDENTIFICATION  Patient Name: Olivia Lewis Birthdate: [birth year] Sex: female Admission Date (Current Location): 07/07/2023  [facility] and [place] Number:  Chiropodist and Address:  [facility], [address], [place], [place] [address]      Provider Number: [id number]  Attending Physician Name and Address:  Kathrynn Running, MD  Relative Name and Phone Number:  Megan Mans (Other)  [phone] [facility])    Current Level of Care: Hospital Recommended Level of Care: Skilled Nursing Facility Prior Approval Number:    Date Approved/Denied:   PASRR Number: [id number]  Discharge Plan:      Current Diagnoses: Patient Active Problem List   Diagnosis Date Noted   (HFpEF) heart failure with preserved ejection fraction (HCC) 07/08/2023   Pulmonary hypertension (HCC) 07/08/2023   Acute hypoxic respiratory failure (HCC) 07/07/2023   Multiple rib fractures 07/07/2023   Compression fracture of T3 vertebra (HCC) 07/07/2023   Dysphagia 07/07/2023   Pain due to onychomycosis of toenails of both feet 12/10/2021   Osteoarthritis (arthritis due to wear and tear of joints) 08/13/2021   Hyperlipidemia 08/13/2021   Elevated troponin 04/08/2021   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   Paroxysmal atrial fibrillation (HCC) 10/21/2020   Fall    Leukocytosis    Acute kidney injury superimposed on CKD (HCC)    Atrial fibrillation (HCC) 10/11/2020   Atrial fibrillation with rapid ventricular response (HCC) 10/10/2020   B12 deficiency 04/07/2016   Acid reflux 04/07/2016   HLD (hyperlipidemia) 04/07/2016   Essential hypertension 04/07/2016   Arthritis, degenerative 04/07/2016   OP (osteoporosis) 04/07/2016   Primary osteoarthritis of knee 05/06/2015   Hypothyroidism 02/25/2015   CKD (chronic kidney disease), stage IIIb 02/25/2015   Calcium blood increased 02/25/2015    Hypercalcemia 02/25/2015   CKD (chronic kidney disease) stage 3, GFR 30-59 ml/min (HCC) 02/25/2015   Baker's cyst of knee 05/30/2014    Orientation RESPIRATION BLADDER Height & Weight     Self, Time, Situation, Place  O2 External catheter, Incontinent Weight: 127 lb (57.6 kg) Height:  5' (152.4 cm)  BEHAVIORAL SYMPTOMS/MOOD NEUROLOGICAL BOWEL NUTRITION STATUS        Diet (dys 1)  AMBULATORY STATUS COMMUNICATION OF NEEDS Skin   Limited Assist Verbally Bruising                       Personal Care Assistance Level of Assistance  Bathing, Feeding, Dressing Bathing Assistance: Limited assistance Feeding assistance: Limited assistance Dressing Assistance: Limited assistance     Functional Limitations Info             SPECIAL CARE FACTORS FREQUENCY  PT (By licensed PT), OT (By licensed OT)     PT Frequency: 5 times per week OT Frequency: 5 times per week            Contractures      Additional Factors Info  Code Status, Allergies Code Status Info: DNR Allergies Info: Penicillin V Potassium, Sulfa Antibiotics           Current Medications (07/08/2023):  This is the current hospital active medication list Current Facility-Administered Medications  Medication Dose Route Frequency Provider Last Rate Last Admin   acetaminophen (TYLENOL) 160 MG/5ML solution 650 mg  650 mg Oral Q6H Verdene Lennert, MD   650 mg at 07/08/23 1423   aspirin EC tablet 81 mg  81 mg Oral Daily Verdene Lennert,  MD   81 mg at 07/08/23 0942   enoxaparin (LOVENOX) injection 30 mg  30 mg Subcutaneous Q24H Kathrynn Running, MD   30 mg at 07/08/23 1203   feeding supplement (ENSURE ENLIVE / ENSURE PLUS) liquid 237 mL  237 mL Oral TID BM Wouk, Wilfred Curtis, MD   237 mL at 07/08/23 1423   HYDROmorphone (DILAUDID) injection 0.5 mg  0.5 mg Intravenous Q4H PRN Verdene Lennert, MD   0.5 mg at 07/08/23 0654   ketorolac (TORADOL) 15 MG/ML injection 15 mg  15 mg Intravenous Q8H Wouk, Wilfred Curtis, MD   15  mg at 07/08/23 1407   levothyroxine (SYNTHROID) tablet 50 mcg  50 mcg Oral Q0600 Verdene Lennert, MD   50 mcg at 07/08/23 0555   lisinopril (ZESTRIL) tablet 10 mg  10 mg Oral Daily Verdene Lennert, MD   10 mg at 07/08/23 0942   metoprolol succinate (TOPROL-XL) 24 hr tablet 75 mg  75 mg Oral Daily Verdene Lennert, MD       multivitamin liquid 15 mL  15 mL Oral Daily Wouk, Wilfred Curtis, MD       ondansetron [facility]) tablet 4 mg  4 mg Oral Q6H PRN Verdene Lennert, MD       Or   ondansetron (ZOFRAN) injection 4 mg  4 mg Intravenous Q6H PRN Verdene Lennert, MD       oxyCODONE (Oxy IR/ROXICODONE) immediate release tablet 5 mg  5 mg Oral Q6H PRN Verdene Lennert, MD   5 mg at 07/08/23 0956   polyethylene glycol (MIRALAX / GLYCOLAX) packet 17 g  17 g Oral Daily PRN Verdene Lennert, MD       senna (SENOKOT) tablet 8.6 mg  1 tablet Oral Daily Kathrynn Running, MD   8.6 mg at 07/08/23 1203   sodium chloride flush (NS) 0.9 % injection 3 mL  3 mL Intravenous Q12H Verdene Lennert, MD   3 mL at 07/08/23 7846     Discharge Medications: Please see discharge summary for a list of discharge medications.  Relevant Imaging Results:  Relevant Lab Results:   Additional Information SS #: [id number]  Indie Boehne E Jamal Haskin, LCSW

## 2023-07-09 ENCOUNTER — Inpatient Hospital Stay: Payer: Medicare Other

## 2023-07-09 DIAGNOSIS — E440 Moderate protein-calorie malnutrition: Secondary | ICD-10-CM | POA: Insufficient documentation

## 2023-07-09 LAB — BASIC METABOLIC PANEL
Anion gap: 6 (ref 5–15)
BUN: 30 mg/dL — ABNORMAL HIGH (ref 8–23)
CO2: 23 mmol/L (ref 22–32)
Calcium: 9 mg/dL (ref 8.9–10.3)
Chloride: 107 mmol/L (ref 98–111)
Creatinine, Ser: 1.03 mg/dL — ABNORMAL HIGH (ref 0.44–1.00)
GFR, Estimated: 51 mL/min — ABNORMAL LOW (ref >60)
Glucose, Bld: 105 mg/dL — ABNORMAL HIGH (ref 70–99)
Potassium: 4.4 mmol/L (ref 3.5–5.1)
Sodium: 136 mmol/L (ref 135–145)

## 2023-07-09 LAB — D-DIMER, QUANTITATIVE: D-Dimer, Quant: 1.35 ug{FEU}/mL — ABNORMAL HIGH (ref 0.00–0.50)

## 2023-07-09 LAB — VITAMIN D 25 HYDROXY (VIT D DEFICIENCY, FRACTURES): Vit D, 25-Hydroxy: 35.32 ng/mL (ref 30–100)

## 2023-07-09 MED ORDER — KETOROLAC TROMETHAMINE 15 MG/ML IJ SOLN
15.0000 mg | Freq: Three times a day (TID) | INTRAMUSCULAR | Status: DC | PRN
  Administered 2023-07-09 – 2023-07-10 (×4): 15 mg via INTRAVENOUS
  Filled 2023-07-09 (×4): qty 1

## 2023-07-09 MED ORDER — DOCUSATE SODIUM 100 MG PO CAPS: 100.0000 mg | ORAL_CAPSULE | Freq: Every day | ORAL | Status: DC | PRN

## 2023-07-09 MED ORDER — BISACODYL 10 MG RE SUPP: 10.0000 mg | Freq: Every day | RECTAL | Status: DC | PRN

## 2023-07-09 MED ORDER — METOPROLOL TARTRATE 25 MG PO TABS
37.5000 mg | ORAL_TABLET | Freq: Two times a day (BID) | ORAL | Status: DC
  Administered 2023-07-09 – 2023-07-11 (×4): 37.5 mg via ORAL
  Filled 2023-07-09 (×4): qty 2

## 2023-07-09 MED ORDER — ACETAMINOPHEN 325 MG PO TABS
650.0000 mg | ORAL_TABLET | Freq: Four times a day (QID) | ORAL | Status: DC | PRN
  Administered 2023-07-09 – 2023-07-11 (×2): 650 mg via ORAL
  Filled 2023-07-09: qty 2

## 2023-07-09 MED ORDER — IOHEXOL 350 MG/ML SOLN
75.0000 mL | Freq: Once | INTRAVENOUS | Status: AC | PRN
  Administered 2023-07-09: 75 mL via INTRAVENOUS

## 2023-07-09 NOTE — TOC Progression Note (Addendum)
Transition of Care [facility]) - Progression Note    Patient Details  Name: Olivia Lewis MRN: [id number] Date of Birth: [birth year]  Transition [facility]) CM/SW Contact  Liliana Cline, LCSW Phone Number: 07/09/2023, 11:09 AM  Clinical Narrative:    Met with patient at bedside. Provided Medicare Care Compare list of SNFs and explained current bed offers. She would like to review with her family today.   12:45- Call from friend [name] [place] Levo) who is at bedside. Spoke to [place] and patient. Patient would like to go to [facility] (1st choice). Spoke to Sue Lush with [facility] who states she can most likely take patient on Monday if her current patient discharges as scheduled. Patient states if [facility] does not have a bed on Monday, her 2nd choice would be [facility] and 3rd choice would be [facility]. CSW started insurance auth for [facility] Monday. TOC to change SNF if needed Monday.    Expected Discharge Plan: Skilled Nursing Facility Barriers to Discharge: Continued Medical Work up  Expected Discharge Plan and Services       Living arrangements for the past 2 months: Single Family Home                                       Social Determinants of Health (SDOH) Interventions SDOH Screenings   Food Insecurity: No Food Insecurity (07/07/2023)  Housing: Low Risk  (07/07/2023)  Transportation Needs: No Transportation Needs (07/07/2023)  Utilities: Not At Risk (07/07/2023)  Tobacco Use: Low Risk  (07/06/2023)    Readmission Risk Interventions     No data to display

## 2023-07-09 NOTE — Progress Notes (Signed)
PROGRESS NOTE    Olivia Lewis  AVW:[id number] DOB: [birth year] DOA: 07/07/2023 PCP: Gracelyn Nurse, MD     Brief Narrative:   Olivia Lewis is a 89 y.o. female with medical history significant of HFpEF, CKD stage III, hypertension, hyperlipidemia, osteoporosis, hypothyroidism, and frequent falls who presents to the ED due to shortness of breath.   Olivia Lewis states that yesterday, she was walking with her walker to go lay down due to back pain.  She turned her head to let her home care aide know, and upon turning her head back, experience 1 or 2-second dizziness that led to her losing her balance and falling backwards.  She denies any other symptoms prior to the fall including chest pain, palpitations, shortness of breath, headache.  She notes that she fell backwards onto her back and hit the back of her head against the ground.  She denies any loss of consciousness.  Due to back pain, she laid on the ground and EMS was called.  At this time, she endorses some shortness of breath that has been improved by supplemental oxygen.  She endorses bilateral chest wall pain and back pain.   Per chart review, patient presented to the ED yesterday after a ground-level fall that was mechanical in nature.  Patient states she lost her balance and fell backwards hitting the back of her head.  Workup at that time demonstrated a subacute versus acute T3 compression fracture.  Neurosurgery consulted and recommended outpatient follow-up.   Assessment & Plan:   Principal Problem:   Acute hypoxic respiratory failure (HCC) Active Problems:   Multiple rib fractures   Compression fracture of T3 vertebra (HCC)   OP (osteoporosis)   Fall   Dysphagia   Hypothyroidism   CKD (chronic kidney disease), stage IIIb   Essential hypertension   Atrial fibrillation (HCC)   Monoclonal B-cell lymphocytosis of undetermined significance   (HFpEF) heart failure with preserved ejection fraction (HCC)   Pulmonary  hypertension (HCC)   Malnutrition of moderate degree  # Rib fractures Mildly displaced fractures of right ribs 2-6 after mechanical fall at home 2 days ago. PT advising SNF - pain control toradol prn, oxy/dilaudid prn - IC - miralax/senna  # Acute hypoxic respiratory failure Likely 2/2 to the above. No signs infection on CT - O2 - f/u dimer  # Subacute thoracic compression fracture Seen on CT 8/14, neurosurgery consulted by EDP, per report advised outpt f/u  # Osteoporosis - outpt f/u - will check vit d  # Dysphagia - SLP advising dysphagia 1 diet  # A-fib Rate controlled. Has declined anticoagulation - cont home metop (switch to tartrate so that pills can be crushed)  # CKD 3b Stable - monitor  # Hypothyroid TSH wnl - cont home synthroid  # HTN Bp controlled - cont home lisinopril, metop - holding asa, don't see an indication for it    DVT prophylaxis: lovenox Code Status: dnr Family Communication: friend updated @ bedside 8/17  Level of care: Med-Surg Status is: inpt    Consultants:  none  Procedures: none  Antimicrobials:  none    Subjective: Reports ongoing rib pain  Objective: Vitals:   07/08/23 1158 07/08/23 1456 07/08/23 2334 07/09/23 0814  BP:  (!) 144/69 (!) 148/78 113/83  Pulse:  (!) 53 69 66  Resp:  17 16 18   Temp:  98.1 F (36.7 C) (!) 97.5 F (36.4 C) 98 F (36.7 C)  TempSrc:      SpO2:  97% 97% 94% 97%  Weight:      Height:        Intake/Output Summary (Last 24 hours) at 07/09/2023 1405 Last data filed at 07/08/2023 1800 Gross per 24 hour  Intake 100 ml  Output --  Net 100 ml   Filed Weights   07/07/23 1639  Weight: 57.6 kg    Examination:  General exam: kyphotic Respiratory system: rales at bases otherwise clear Cardiovascular system: S1 & S2 heard, RRR. No JVD, murmurs, rubs, gallops or clicks. No pedal edema. Gastrointestinal system: Abdomen is nondistended, soft and nontender. No organomegaly or masses  felt. Normal bowel sounds heard. Central nervous system: Alert and oriented. No focal neurological deficits. Extremities: Symmetric 5 x 5 power. Skin: No rashes, lesions or ulcers Psychiatry: Judgement and insight appear normal. Mood & affect appropriate.     Data Reviewed: I have personally reviewed following labs and imaging studies  CBC: Recent Labs  Lab 07/07/23 1032 07/08/23 0301  WBC 14.2* 12.8*  NEUTROABS  --  7.8*  HGB 15.3* 13.5  HCT 46.1* 41.2  MCV 89.3 91.2  PLT 279 240   Basic Metabolic Panel: Recent Labs  Lab 07/07/23 1032 07/09/23 0508  NA 139 136  K 4.5 4.4  CL 103 107  CO2 27 23  GLUCOSE 115* 105*  BUN 26* 30*  CREATININE 1.10* 1.03*  CALCIUM 9.0 9.0   GFR: Estimated Creatinine Clearance: 27.7 mL/min (A) (by C-G formula based on SCr of 1.03 mg/dL (H)). Liver Function Tests: Recent Labs  Lab 07/07/23 1032  AST 40  ALT 19  ALKPHOS 73  BILITOT 1.4*  PROT 6.5  ALBUMIN 3.5   No results for input(s): "LIPASE", "AMYLASE" in the last 168 hours. No results for input(s): "AMMONIA" in the last 168 hours. Coagulation Profile: No results for input(s): "INR", "PROTIME" in the last 168 hours. Cardiac Enzymes: No results for input(s): "CKTOTAL", "CKMB", "CKMBINDEX", "TROPONINI" in the last 168 hours. BNP (last 3 results) No results for input(s): "PROBNP" in the last 8760 hours. HbA1C: No results for input(s): "HGBA1C" in the last 72 hours. CBG: Recent Labs  Lab 07/08/23 1603  GLUCAP 92   Lipid Profile: No results for input(s): "CHOL", "HDL", "LDLCALC", "TRIG", "CHOLHDL", "LDLDIRECT" in the last 72 hours. Thyroid Function Tests: Recent Labs    07/08/23 0301  TSH 2.383   Anemia Panel: No results for input(s): "VITAMINB12", "FOLATE", "FERRITIN", "TIBC", "IRON", "RETICCTPCT" in the last 72 hours. Urine analysis:    Component Value Date/Time   COLORURINE YELLOW (A) 01/08/2023 1734   APPEARANCEUR HAZY (A) 01/08/2023 1734   LABSPEC 1.023  01/08/2023 1734   PHURINE 5.0 01/08/2023 1734   GLUCOSEU NEGATIVE 01/08/2023 1734   HGBUR NEGATIVE 01/08/2023 1734   BILIRUBINUR NEGATIVE 01/08/2023 1734   KETONESUR NEGATIVE 01/08/2023 1734   PROTEINUR 30 (A) 01/08/2023 1734   NITRITE POSITIVE (A) 01/08/2023 1734   LEUKOCYTESUR NEGATIVE 01/08/2023 1734   Sepsis Labs: @LABRCNTIP (procalcitonin:4,lacticidven:4)  )No results found for this or any previous visit (from the past 240 hour(s)).       Radiology Studies: No results found.      Scheduled Meds:  aspirin EC  81 mg Oral Daily   enoxaparin (LOVENOX) injection  30 mg Subcutaneous Q24H   feeding supplement  237 mL Oral TID BM   ketorolac  15 mg Intravenous Q8H   levothyroxine  50 mcg Oral Q0600   lisinopril  10 mg Oral Daily   metoprolol succinate  75 mg Oral Daily  multivitamin  15 mL Oral Daily   pantoprazole  40 mg Oral Daily   senna  1 tablet Oral Daily   sodium chloride flush  3 mL Intravenous Q12H   Continuous Infusions:   LOS: 1 day     Silvano Bilis, MD [facility]   If 7PM-7AM, please contact night-coverage [email or web address] Password [facility] 07/09/2023, 2:05 PM

## 2023-07-09 NOTE — Plan of Care (Signed)

## 2023-07-09 NOTE — Progress Notes (Signed)
Physical Therapy Treatment Patient Details Name: Olivia Lewis MRN: [id number] DOB: 02-07-1929 Today's Date: 07/09/2023   History of Present Illness Pt is a 89 y.o. female with a past medical history of anemia, CHF, CKD, gastric reflux, hypertension, hyperlipidemia, presents to the emergency department for shortness of breath.  According to report and patient she was seen in the EDt yesterday following a fall.  Patient workup included CT images of her spine and head with no significant findings or traumatic findings.  Patient was discharged home but continues to complain of pain across the lower chest as well as SOB.  Patient started on 2L [place] due to desat.  No fever.  No reported cough.  Patient is awake alert able to give a decent history.    PT Comments  Pt seen for PT tx with pt agreeable, friend present for session. Pt on 2L/min via nasal cannula with SpO2 >90%. Pt requires mod assist for bed mobility even with use of bed rails, HOB elevated (most assistance required to scoot buttocks to EOB). Pt is able to progress to ambulating to door & back with RW & min assist. Pt is making steady progress with functional mobility. Will continue to follow pt acutely to progress gait with LRAD, endurance, balance, & strengthening.    If plan is discharge home, recommend the following: Direct supervision/assist for medications management;Assist for transportation;Assistance with feeding;Help with stairs or ramp for entrance;A little help with walking and/or transfers;A little help with bathing/dressing/bathroom;Assistance with cooking/housework   Can travel by private vehicle     Yes  Equipment Recommendations  Other (comment) (defer to next venue)    Recommendations for Other Services       Precautions / Restrictions Precautions Precautions: Fall Restrictions Weight Bearing Restrictions: No     Mobility  Bed Mobility Overal bed mobility: Needs Assistance Bed Mobility: Supine to Sit      Supine to sit: Mod assist, HOB elevated, Used rails     General bed mobility comments: pt able to upright trunk with use of bed rails, HOB elevated but requires significant assistance to scoot buttocks to edge of seat    Transfers Overall transfer level: Needs assistance Equipment used: Rolling walker (2 wheels) Transfers: Sit to/from Stand, Bed to chair/wheelchair/BSC Sit to Stand: Min assist   Step pivot transfers: Min assist (with RW)            Ambulation/Gait Ambulation/Gait assistance: Min assist Gait Distance (Feet): 22 Feet (+ 22 ft) Assistive device: Rolling walker (2 wheels) Gait Pattern/deviations: Decreased step length - right, Decreased step length - left, Decreased dorsiflexion - right, Decreased dorsiflexion - left, Decreased stride length Gait velocity: decreased     General Gait Details: Pt ambulates to door & back x 2 times with seated rest break in between with RW & min assist.   Stairs             Wheelchair Mobility     Tilt Bed    Modified Rankin (Stroke Patients Only)       Balance Overall balance assessment: Needs assistance Sitting-balance support: Bilateral upper extremity supported, Feet supported Sitting balance-Leahy Scale: Poor     Standing balance support: Bilateral upper extremity supported, During functional activity, Reliant on assistive device for balance Standing balance-Leahy Scale: Poor                              Cognition Arousal: Alert Behavior During Therapy: [facility]  for tasks assessed/performed Overall Cognitive Status: Within Functional Limits for tasks assessed                                 General Comments: Pleasant and willing to participate        Exercises      General Comments General comments (skin integrity, edema, etc.): Pt on 2L/min via nasal cannula with SpO2 >90% after first gait trial. Pt requesting band aid or patch to put on chin as it's causing discomfort  where it's touching her chest.      Pertinent Vitals/Pain Pain Assessment Pain Assessment: Faces Faces Pain Scale: Hurts little more Pain Location: posterior neck, L side of ribs, chin where it touches her chest Pain Descriptors / Indicators: Discomfort, Grimacing, Guarding Pain Intervention(s): Monitored during session, Repositioned, Limited activity within patient's tolerance (educated pt on use of pillow for compression to decrease rib pain with coughing/sneezing)    Home Living                          Prior Function            PT Goals (current goals can now be found in the care plan section) Acute Rehab PT Goals Patient Stated Goal: rehab then home PT Goal Formulation: With patient Time For Goal Achievement: 07/21/23 Potential to Achieve Goals: Fair Progress towards PT goals: Progressing toward goals    Frequency    Min 1X/week      PT Plan      Co-evaluation              AM-PAC PT "6 Clicks" Mobility   Outcome Measure  Help needed turning from your back to your side while in a flat bed without using bedrails?: A Little Help needed moving from lying on your back to sitting on the side of a flat bed without using bedrails?: A Lot Help needed moving to and from a bed to a chair (including a wheelchair)?: A Little Help needed standing up from a chair using your arms (e.g., wheelchair or bedside chair)?: A Little Help needed to walk in hospital room?: A Little Help needed climbing 3-5 steps with a railing? : A Little 6 Click Score: 17    End of Session Equipment Utilized During Treatment: Oxygen Activity Tolerance: Patient tolerated treatment well Patient left: in chair;with chair alarm set;with call bell/phone within reach;with family/visitor present Nurse Communication: Mobility status PT Visit Diagnosis: Difficulty in walking, not elsewhere classified (R26.2);Other abnormalities of gait and mobility (R26.89);Muscle weakness (generalized)  (M62.81);Pain Pain - Right/Left: Left Pain - part of body:  (ribs)     Time: 6045-4098 PT Time Calculation (min) (ACUTE ONLY): 26 min  Charges:    $Therapeutic Activity: 23-37 mins PT General Charges $$ ACUTE PT VISIT: 1 Visit                     Aleda Grana, PT, DPT 07/09/23, 2:33 PM   Sandi Mariscal 07/09/2023, 2:31 PM

## 2023-07-10 MED ORDER — POLYETHYLENE GLYCOL 3350 17 G PO PACK
34.0000 g | PACK | Freq: Every day | ORAL | Status: DC
  Administered 2023-07-11: 34 g via ORAL
  Filled 2023-07-10: qty 2

## 2023-07-10 MED ORDER — POLYETHYLENE GLYCOL 3350 17 G PO PACK
17.0000 g | PACK | Freq: Once | ORAL | Status: AC
  Administered 2023-07-10: 17 g via ORAL
  Filled 2023-07-10: qty 1

## 2023-07-10 NOTE — Progress Notes (Signed)
Physical Therapy Treatment Patient Details Name: Olivia Lewis MRN: [id number] DOB: 07-22-1929 Today's Date: 07/10/2023   History of Present Illness Pt is a 89 y.o. female with a past medical history of anemia, CHF, CKD, gastric reflux, hypertension, hyperlipidemia, presents to the emergency department for shortness of breath.  According to report and patient she was seen in the EDt yesterday following a fall.  Patient workup included CT images of her spine and head with no significant findings or traumatic findings.  Patient was discharged home but continues to complain of pain across the lower chest as well as SOB.  Patient started on 2L [place] due to desat.  No fever.  No reported cough.  Patient is awake alert able to give a decent history.    PT Comments  Up up to chair x 2 today.  Upon entering room this pm, pt standing with RN to get back to bed but agrees to a short walk before doing so.  Walks to door and back with RW and min a x 1.  Generally unsteady but no formal LOB's needing assist to recover.  She is able to return to supine with min/mod a x 1 for BLE's due to kyphosis and rib pain.  Positioned for comfort with needs met.   If plan is discharge home, recommend the following: Direct supervision/assist for medications management;Assist for transportation;Assistance with feeding;Help with stairs or ramp for entrance;A little help with walking and/or transfers;A little help with bathing/dressing/bathroom;Assistance with cooking/housework   Can travel by private vehicle     Yes  Equipment Recommendations  Other (comment) (defer to next venue)    Recommendations for Other Services       Precautions / Restrictions Precautions Precautions: Fall Restrictions Weight Bearing Restrictions: No     Mobility  Bed Mobility               General bed mobility comments: standing with RN upon arrival to get back to bed but agrees to walk    Transfers                         Ambulation/Gait Ambulation/Gait assistance: Min assist Gait Distance (Feet): 22 Feet Assistive device: Rolling walker (2 wheels) Gait Pattern/deviations: Decreased step length - right, Decreased step length - left, Decreased dorsiflexion - right, Decreased dorsiflexion - left, Decreased stride length Gait velocity: decreased         Stairs             Wheelchair Mobility     Tilt Bed    Modified Rankin (Stroke Patients Only)       Balance Overall balance assessment: Needs assistance Sitting-balance support: Bilateral upper extremity supported, Feet supported Sitting balance-Leahy Scale: Poor     Standing balance support: Bilateral upper extremity supported, During functional activity, Reliant on assistive device for balance Standing balance-Leahy Scale: Poor                              Cognition Arousal: Alert Behavior During Therapy: WFL for tasks assessed/performed Overall Cognitive Status: Within Functional Limits for tasks assessed                                          Exercises      General Comments  Pertinent Vitals/Pain Pain Assessment Pain Assessment: Faces Faces Pain Scale: Hurts even more Pain Location: posterior neck, L side of ribs, chin where it touches her chest - awaiting pain meds 30 minutes from nursing as scheduled Pain Descriptors / Indicators: Discomfort, Grimacing, Guarding Pain Intervention(s): Limited activity within patient's tolerance, Monitored during session, Repositioned    Home Living                          Prior Function            PT Goals (current goals can now be found in the care plan section) Progress towards PT goals: Progressing toward goals    Frequency    Min 1X/week      PT Plan      Co-evaluation              AM-PAC PT "6 Clicks" Mobility   Outcome Measure  Help needed turning from your back to your side while in a flat bed without  using bedrails?: A Little Help needed moving from lying on your back to sitting on the side of a flat bed without using bedrails?: A Lot Help needed moving to and from a bed to a chair (including a wheelchair)?: A Little Help needed standing up from a chair using your arms (e.g., wheelchair or bedside chair)?: A Little Help needed to walk in hospital room?: A Little Help needed climbing 3-5 steps with a railing? : A Lot 6 Click Score: 16    End of Session Equipment Utilized During Treatment: Oxygen Activity Tolerance: Patient tolerated treatment well Patient left: in chair;with chair alarm set;with call bell/phone within reach;with family/visitor present Nurse Communication: Mobility status PT Visit Diagnosis: Difficulty in walking, not elsewhere classified (R26.2);Other abnormalities of gait and mobility (R26.89);Muscle weakness (generalized) (M62.81);Pain Pain - Right/Left: Left Pain - part of body:  (ribs)     Time: 1610-9604 PT Time Calculation (min) (ACUTE ONLY): 10 min  Charges:    $Gait Training: 8-22 mins PT General Charges $$ ACUTE PT VISIT: 1 Visit                   Danielle Dess, PTA 07/10/23, 3:08 PM

## 2023-07-10 NOTE — Progress Notes (Signed)
PROGRESS NOTE    Olivia Lewis  UYQ:[id number] DOB: February 12, 1929 DOA: 07/07/2023 PCP: Gracelyn Nurse, MD     Brief Narrative:   Olivia Lewis is a 89 y.o. female with medical history significant of HFpEF, CKD stage III, hypertension, hyperlipidemia, osteoporosis, hypothyroidism, and frequent falls who presents to the ED due to shortness of breath.   [name] states that yesterday, she was walking with her walker to go lay down due to back pain.  She turned her head to let her home care aide know, and upon turning her head back, experience 1 or 2-second dizziness that led to her losing her balance and falling backwards.  She denies any other symptoms prior to the fall including chest pain, palpitations, shortness of breath, headache.  She notes that she fell backwards onto her back and hit the back of her head against the ground.  She denies any loss of consciousness.  Due to back pain, she laid on the ground and EMS was called.  At this time, she endorses some shortness of breath that has been improved by supplemental oxygen.  She endorses bilateral chest wall pain and back pain.   Per chart review, patient presented to the ED yesterday after a ground-level fall that was mechanical in nature.  Patient states she lost her balance and fell backwards hitting the back of her head.  Workup at that time demonstrated a subacute versus acute T3 compression fracture.  Neurosurgery consulted and recommended outpatient follow-up.   Assessment & Plan:   Principal Problem:   Acute hypoxic respiratory failure (HCC) Active Problems:   Multiple rib fractures   Compression fracture of T3 vertebra (HCC)   OP (osteoporosis)   Fall   Dysphagia   Hypothyroidism   CKD (chronic kidney disease), stage IIIb   Essential hypertension   Atrial fibrillation (HCC)   Monoclonal B-cell lymphocytosis of undetermined significance   (HFpEF) heart failure with preserved ejection fraction (HCC)   Pulmonary  hypertension (HCC)   Malnutrition of moderate degree  # Rib fractures Mildly displaced fractures of right ribs 2-6 after mechanical fall at home 2 days ago. PT advising SNF - pain control toradol prn, oxy/dilaudid prn - IC  # Constipation - miralax/senna  # Acute hypoxic respiratory failure No signs infection or PE on CTA but does show atelectasis - O2 - IC  # Subacute thoracic compression fracture Seen on CT 8/14, neurosurgery consulted by EDP, per report advised outpt f/u  # Osteoporosis Vit d wnl - outpt f/u  # Dysphagia - SLP advising dysphagia 1 diet  # A-fib Rate controlled. Has declined anticoagulation - cont home metop (switched to tartrate so that pills can be crushed)  # CKD 3b Stable - monitor  # Hypothyroid TSH wnl - cont home synthroid  # HTN Bp controlled - cont home lisinopril, metop - holding asa, don't see an indication for it    DVT prophylaxis: lovenox Code Status: dnr Family Communication: friend updated @ bedside 8/17  Level of care: Med-Surg Status is: inpt    Consultants:  none  Procedures: none  Antimicrobials:  none    Subjective: Reports rib pain improving  Objective: Vitals:   07/09/23 1536 07/09/23 2115 07/09/23 2340 07/10/23 0738  BP: (!) 153/71 (!) 140/72 135/62 (!) 140/77  Pulse: 69 70 (!) 54 62  Resp: 16  18 20   Temp: 98.2 F (36.8 C) 98.3 F (36.8 C) 97.9 F (36.6 C) 97.8 F (36.6 C)  TempSrc:  Oral  SpO2: 95% 99% 98% 95%  Weight:      Height:        Intake/Output Summary (Last 24 hours) at 07/10/2023 1237 Last data filed at 07/10/2023 1610 Gross per 24 hour  Intake --  Output 150 ml  Net -150 ml   Filed Weights   07/07/23 1639  Weight: 57.6 kg    Examination:  General exam: kyphotic Respiratory system: rales at bases otherwise clear Cardiovascular system: S1 & S2 heard, RRR. No JVD, murmurs, rubs, gallops or clicks. No pedal edema. Gastrointestinal system: Abdomen is nondistended,  soft and nontender. No organomegaly or masses felt. Normal bowel sounds heard. Central nervous system: Alert and oriented. No focal neurological deficits. Extremities: Symmetric 5 x 5 power. Skin: No rashes, lesions or ulcers Psychiatry: Judgement and insight appear normal. Mood & affect appropriate.     Data Reviewed: I have personally reviewed following labs and imaging studies  CBC: Recent Labs  Lab 07/07/23 1032 07/08/23 0301  WBC 14.2* 12.8*  NEUTROABS  --  7.8*  HGB 15.3* 13.5  HCT 46.1* 41.2  MCV 89.3 91.2  PLT 279 240   Basic Metabolic Panel: Recent Labs  Lab 07/07/23 1032 07/09/23 0508  NA 139 136  K 4.5 4.4  CL 103 107  CO2 27 23  GLUCOSE 115* 105*  BUN 26* 30*  CREATININE 1.10* 1.03*  CALCIUM 9.0 9.0   GFR: Estimated Creatinine Clearance: 27.7 mL/min (A) (by C-G formula based on SCr of 1.03 mg/dL (H)). Liver Function Tests: Recent Labs  Lab 07/07/23 1032  AST 40  ALT 19  ALKPHOS 73  BILITOT 1.4*  PROT 6.5  ALBUMIN 3.5   No results for input(s): "LIPASE", "AMYLASE" in the last 168 hours. No results for input(s): "AMMONIA" in the last 168 hours. Coagulation Profile: No results for input(s): "INR", "PROTIME" in the last 168 hours. Cardiac Enzymes: No results for input(s): "CKTOTAL", "CKMB", "CKMBINDEX", "TROPONINI" in the last 168 hours. BNP (last 3 results) No results for input(s): "PROBNP" in the last 8760 hours. HbA1C: No results for input(s): "HGBA1C" in the last 72 hours. CBG: Recent Labs  Lab 07/08/23 1603  GLUCAP 92   Lipid Profile: No results for input(s): "CHOL", "HDL", "LDLCALC", "TRIG", "CHOLHDL", "LDLDIRECT" in the last 72 hours. Thyroid Function Tests: Recent Labs    07/08/23 0301  TSH 2.383   Anemia Panel: No results for input(s): "VITAMINB12", "FOLATE", "FERRITIN", "TIBC", "IRON", "RETICCTPCT" in the last 72 hours. Urine analysis:    Component Value Date/Time   COLORURINE YELLOW (A) 01/08/2023 1734   APPEARANCEUR  HAZY (A) 01/08/2023 1734   LABSPEC 1.023 01/08/2023 1734   PHURINE 5.0 01/08/2023 1734   GLUCOSEU NEGATIVE 01/08/2023 1734   HGBUR NEGATIVE 01/08/2023 1734   BILIRUBINUR NEGATIVE 01/08/2023 1734   KETONESUR NEGATIVE 01/08/2023 1734   PROTEINUR 30 (A) 01/08/2023 1734   NITRITE POSITIVE (A) 01/08/2023 1734   LEUKOCYTESUR NEGATIVE 01/08/2023 1734   Sepsis Labs: @LABRCNTIP (procalcitonin:4,lacticidven:4)  )No results found for this or any previous visit (from the past 240 hour(s)).       Radiology Studies: CT Angio Chest Pulmonary Embolism (PE) W or WO Contrast  Result Date: 07/09/2023 CLINICAL DATA:  Positive D-dimer. EXAM: CT ANGIOGRAPHY CHEST WITH CONTRAST TECHNIQUE: Multidetector CT imaging of the chest was performed using the standard protocol during bolus administration of intravenous contrast. Multiplanar CT image reconstructions and MIPs were obtained to evaluate the vascular anatomy. RADIATION DOSE REDUCTION: This exam was performed according to the departmental dose-optimization program which  includes automated exposure control, adjustment of the mA and/or kV according to patient size and/or use of iterative reconstruction technique. CONTRAST:  75mL OMNIPAQUE IOHEXOL 350 MG/ML SOLN COMPARISON:  CT chest without contrast 07/07/2023 FINDINGS: Cardiovascular: Satisfactory opacification of the pulmonary arteries to the segmental level. No evidence of pulmonary embolism. Heart is moderately enlarged. No pericardial effusion. There are atherosclerotic calcifications of the aorta and coronary arteries. Mediastinum/Nodes: No enlarged mediastinal, hilar, or axillary lymph nodes. Thyroid gland, trachea, and esophagus demonstrate no significant findings. Lungs/Pleura: There is scarring in both lung apices. There small bilateral pleural effusions similar to prior examination. There is a small amount of atelectasis/airspace disease in the bilateral lower lobes, new/increasing from prior. Trachea and  central airways are patent. There is no pneumothorax. Upper Abdomen: No acute abnormality. Musculoskeletal: Acute second, third, fourth, fifth and sixth rib fractures appear similar to the prior examination. Multiple chronic healed rib fractures are again noted bilaterally. There is accentuated kyphosis of the thoracic spine similar to prior. Chronic sternal and manubrial fractures appear unchanged. Chronic fractures of T1, T2 and T3 are unchanged. Review of the MIP images confirms the above findings. IMPRESSION: 1. No evidence for pulmonary embolism. 2. Stable small bilateral pleural effusions. 3. New/increasing bilateral lower lobe atelectasis/airspace disease. 4. Stable cardiomegaly. 5. Aortic atherosclerosis. Aortic Atherosclerosis (ICD10-I70.0). Electronically Signed   By: Darliss Cheney M.D.   On: 07/09/2023 21:11        Scheduled Meds:  enoxaparin (LOVENOX) injection  30 mg Subcutaneous Q24H   feeding supplement  237 mL Oral TID BM   levothyroxine  50 mcg Oral Q0600   lisinopril  10 mg Oral Daily   metoprolol tartrate  37.5 mg Oral BID   multivitamin  15 mL Oral Daily   pantoprazole  40 mg Oral Daily   senna  1 tablet Oral Daily   sodium chloride flush  3 mL Intravenous Q12H   Continuous Infusions:   LOS: 2 days     Silvano Bilis, MD [facility]   If 7PM-7AM, please contact night-coverage [email or web address] Password [facility] 07/10/2023, 12:37 PM

## 2023-07-10 NOTE — TOC Progression Note (Addendum)
Transition of Care [facility]) - Progression Note    Patient Details  Name: Olivia Lewis MRN: [id number] Date of Birth: 08-17-1929  Transition of Care [facility]) CM/SW Contact  Liliana Cline, LCSW Phone Number: 07/10/2023, 8:54 AM  Clinical Narrative:    Insurance Berkley Harvey is pending in [facility].   1:50- Auth approved.   Expected Discharge Plan: Skilled Nursing Facility Barriers to Discharge: Continued Medical Work up  Expected Discharge Plan and Services       Living arrangements for the past 2 months: Single Family Home                                       Social Determinants of Health (SDOH) Interventions SDOH Screenings   Food Insecurity: No Food Insecurity (07/07/2023)  Housing: Low Risk  (07/07/2023)  Transportation Needs: No Transportation Needs (07/07/2023)  Utilities: Not At Risk (07/07/2023)  Tobacco Use: Low Risk  (07/06/2023)    Readmission Risk Interventions     No data to display

## 2023-07-11 ENCOUNTER — Telehealth: Payer: Self-pay | Admitting: Student

## 2023-07-11 DIAGNOSIS — S2241XA Multiple fractures of ribs, right side, initial encounter for closed fracture: Secondary | ICD-10-CM

## 2023-07-11 MED ORDER — OXYCODONE HCL 5 MG PO TABS
5.0000 mg | ORAL_TABLET | Freq: Four times a day (QID) | ORAL | 0 refills | Status: DC | PRN
Start: 2023-07-11 — End: 2023-07-11

## 2023-07-11 MED ORDER — OXYCODONE HCL 5 MG PO TABS: 5.0000 mg | ORAL_TABLET | Freq: Four times a day (QID) | ORAL | 0 refills | Status: DC | PRN

## 2023-07-11 MED ORDER — POLYETHYLENE GLYCOL 3350 17 G PO PACK: 34.0000 g | PACK | Freq: Every day | ORAL | Status: DC

## 2023-07-11 MED ORDER — METOPROLOL TARTRATE 37.5 MG PO TABS: 37.5000 mg | ORAL_TABLET | Freq: Two times a day (BID) | ORAL | Status: DC

## 2023-07-11 MED ORDER — SENNA 8.6 MG PO TABS: 1.0000 | ORAL_TABLET | Freq: Every day | ORAL | Status: DC

## 2023-07-11 NOTE — TOC Transition Note (Signed)
Transition of Care [facility]) - CM/SW Discharge Note   Patient Details  Name: Olivia Lewis MRN: [id number] Date of Birth: [birth year]  [facility] CM/SW Contact:  Margarito Liner, LCSW Phone Number: 07/11/2023, 1:26 PM   Clinical Narrative:   Patient has orders to discharge to [facility] today. RN has already called report. EMS transport has been arranged and she is 3rd on the list. No further concerns. CSW signing off.  Final next level of care: Skilled Nursing Facility Barriers to Discharge: Barriers Resolved   Patient Goals and CMS Choice CMS Medicare.gov Compare Post Acute Care list provided to:: Patient Choice offered to / list presented to : Patient  Discharge Placement PASRR number recieved: 07/08/23 PASRR number recieved: 07/08/23            Patient chooses bed at: [facility] Patient to be transferred to facility by: EMS Name of family member notified: Thomos Lemons Patient and family notified of of transfer: 07/11/23  Discharge Plan and Services Additional resources added to the After Visit Summary for                                       Social Determinants of Health (SDOH) Interventions SDOH Screenings   Food Insecurity: No Food Insecurity (07/07/2023)  Housing: Low Risk  (07/07/2023)  Transportation Needs: No Transportation Needs (07/07/2023)  Utilities: Not At Risk (07/07/2023)  Tobacco Use: Low Risk  (07/06/2023)     Readmission Risk Interventions     No data to display

## 2023-07-11 NOTE — Progress Notes (Signed)
Occupational Therapy Treatment Patient Details Name: Olivia Lewis MRN: [id number] DOB: 09/24/1929 Today's Date: 07/11/2023   History of present illness Pt is a 89 y.o. female with a past medical history of anemia, CHF, CKD, gastric reflux, hypertension, hyperlipidemia, presents to the emergency department for shortness of breath.  According to report and patient she was seen in the EDt yesterday following a fall.  Patient workup included CT images of her spine and head with no significant findings or traumatic findings.  Patient was discharged home but continues to complain of pain across the lower chest as well as SOB.  Patient started on 2L [place] due to desat.  No fever.  No reported cough.  Patient is awake alert able to give a decent history.   OT comments  Pt seen for OT tx this morning. Pt agreeable to session focused on toileting and ADL transfers. Pt required light MIN A for STS from recliner and from [facility] with VC for hand placement with slight posterior lean with initial stand. CGA once in standing to take a few steps recliner<>BSC. Pt unsuccessful in toileting attempt. Returned to [employer]. Pt endorsing 6-7/10 L side/rib pain. RN notified at end of session. Pt demonstrating good progress towards goals and continues to benefit from skilled OT services.       If plan is discharge home, recommend the following:  A lot of help with bathing/dressing/bathroom;Assist for transportation;Assistance with cooking/housework;Help with stairs or ramp for entrance;Assistance with feeding;A little help with walking and/or transfers   Equipment Recommendations  Other (comment) (defer to next venue)    Recommendations for Other Services      Precautions / Restrictions Precautions Precautions: Fall Restrictions Weight Bearing Restrictions: No       Mobility Bed Mobility               General bed mobility comments: NT, in recliner    Transfers Overall transfer level: Needs  assistance Equipment used: Rolling walker (2 wheels) Transfers: Sit to/from Stand Sit to Stand: Min assist, Contact guard assist           General transfer comment: light MIN A to prevent slight posterior LOB with initial standing with VC for hand placement     Balance Overall balance assessment: Needs assistance Sitting-balance support: Bilateral upper extremity supported, Feet supported Sitting balance-Leahy Scale: Fair   Postural control: Posterior lean Standing balance support: Bilateral upper extremity supported, During functional activity, Reliant on assistive device for balance Standing balance-Leahy Scale: Poor Standing balance comment: Able to maintain standing balance CGA for safety but hands on assist due to post lean/instability especially with movement                           ADL either performed or assessed with clinical judgement   ADL Overall ADL's : Needs assistance/impaired                         Toilet Transfer: BSC/3in1;Rolling walker (2 wheels);Contact guard assist;Minimal assistance Toilet Transfer Details (indicate cue type and reason): slight MIN A with VC for hand placement         Functional mobility during ADLs: Contact guard assist;Minimal assistance;Rolling walker (2 wheels)      Extremity/Trunk Assessment              Vision       Perception     Praxis      Cognition Arousal:  Alert Behavior During Therapy: WFL for tasks assessed/performed Overall Cognitive Status: Within Functional Limits for tasks assessed                                          Exercises      Shoulder Instructions       General Comments      Pertinent Vitals/ Pain       Pain Assessment Pain Assessment: 0-10 Pain Score: 7  Pain Location: L side/rib pain Pain Descriptors / Indicators: Aching, Moaning, Grimacing, Guarding Pain Intervention(s): Limited activity within patient's tolerance, Monitored during  session, Repositioned, Patient requesting pain meds-RN notified  Home Living                                          Prior Functioning/Environment              Frequency  Min 1X/week        Progress Toward Goals  OT Goals(current goals can now be found in the care plan section)  Progress towards OT goals: Progressing toward goals  Acute Rehab OT Goals Patient Stated Goal: go home OT Goal Formulation: With patient Time For Goal Achievement: 07/22/23 Potential to Achieve Goals: Good  Plan      Co-evaluation                 AM-PAC OT "6 Clicks" Daily Activity     Outcome Measure   Help from another person eating meals?: A Little Help from another person taking care of personal grooming?: A Little Help from another person toileting, which includes using toliet, bedpan, or urinal?: A Lot Help from another person bathing (including washing, rinsing, drying)?: A Lot Help from another person to put on and taking off regular upper body clothing?: A Lot Help from another person to put on and taking off regular lower body clothing?: A Lot 6 Click Score: 14    End of Session Equipment Utilized During Treatment: Rolling walker (2 wheels);Oxygen  OT Visit Diagnosis: Other abnormalities of gait and mobility (R26.89);Repeated falls (R29.6);Muscle weakness (generalized) (M62.81);Pain Pain - Right/Left: Left Pain - part of body:  (side/ribs)   Activity Tolerance Patient tolerated treatment well   Patient Left in chair;with call bell/phone within reach;with chair alarm set   Nurse Communication Patient requests pain meds        Time: 4782-9562 OT Time Calculation (min): 24 min  Charges: OT General Charges $OT Visit: 1 Visit OT Treatments $Self Care/Home Management : 23-37 mins  Arman Filter., MPH, MS, OTR/L ascom [phone] 07/11/23, 11:02 AM

## 2023-07-11 NOTE — Plan of Care (Signed)
  Problem: Health Behavior/Discharge Planning: Goal: Ability to manage health-related needs will improve Outcome: Progressing   Problem: Clinical Measurements: Goal: Will remain free from infection Outcome: Progressing Goal: Respiratory complications will improve Outcome: Progressing Goal: Cardiovascular complication will be avoided Outcome: Progressing   Problem: Activity: Goal: Risk for activity intolerance will decrease Outcome: Progressing   Problem: Nutrition: Goal: Adequate nutrition will be maintained Outcome: Progressing   Problem: Elimination: Goal: Will not experience complications related to bowel motility Outcome: Progressing Goal: Will not experience complications related to urinary retention Outcome: Progressing   Problem: Pain Managment: Goal: General experience of comfort will improve Outcome: Progressing

## 2023-07-11 NOTE — Discharge Summary (Signed)
Olivia Lewis MVH:[id number] DOB: 1929/01/09 DOA: 07/07/2023  PCP: Gracelyn Nurse, MD  Admit date: 07/07/2023 Discharge date: 07/11/2023  Time spent: 35 minutes  Recommendations for Outpatient Follow-up:  PCP f/u Neurosurgery f/u Treat osteoporosis    Discharge Diagnoses:  Principal Problem:   Acute hypoxic respiratory failure (HCC) Active Problems:   Multiple rib fractures   Compression fracture of T3 vertebra (HCC)   OP (osteoporosis)   Fall   Dysphagia   Hypothyroidism   CKD (chronic kidney disease), stage IIIb   Essential hypertension   Atrial fibrillation (HCC)   Monoclonal B-cell lymphocytosis of undetermined significance   (HFpEF) heart failure with preserved ejection fraction (HCC)   Pulmonary hypertension (HCC)   Malnutrition of moderate degree   Discharge Condition: stable  Diet recommendation: heart healthy  Filed Weights   07/07/23 1639  Weight: 57.6 kg    History of present illness:  From admission h and p Olivia Lewis is a 89 y.o. female with medical history significant of HFpEF, CKD stage III, hypertension, hyperlipidemia, osteoporosis, hypothyroidism, and frequent falls who presents to the ED due to shortness of breath.   Olivia Lewis states that yesterday, she was walking with her walker to go lay down due to back pain.  She turned her head to let her home care aide know, and upon turning her head back, experience 1 or 2-second dizziness that led to her losing her balance and falling backwards.  She denies any other symptoms prior to the fall including chest pain, palpitations, shortness of breath, headache.  She notes that she fell backwards onto her back and hit the back of her head against the ground.  She denies any loss of consciousness.  Due to back pain, she laid on the ground and EMS was called.  At this time, she endorses some shortness of breath that has been improved by supplemental oxygen.  She endorses bilateral chest wall pain and  back pain.   Per chart review, patient presented to the ED yesterday after a ground-level fall that was mechanical in nature.  Patient states she lost her balance and fell backwards hitting the back of her head.  Workup at that time demonstrated a subacute versus acute T3 compression fracture.  Neurosurgery consulted and recommended outpatient follow-up.    Hospital Course:   # Rib fractures Mildly displaced fractures of right ribs 2-6 after mechanical fall at home 2 days ago. PT advising SNF - pain control, bowel regimen, incentive spirometry  # Acute hypoxic respiratory failure No signs infection or PE on CTA but does show atelectasis - O2, IC, wean as able   # Subacute thoracic compression fracture Seen on CT 8/14, neurosurgery consulted by EDP, per report advised outpt f/u - outpt f/u   # Osteoporosis Vit d wnl - outpt f/u   # Dysphagia - SLP advising dysphagia 1 diet   # A-fib Rate controlled. Has declined anticoagulation - cont home metop (switched to tartrate so that pills can be crushed)   # CKD 3b Stable   # Hypothyroid TSH wnl - cont home synthroid   # HTN Bp controlled - cont home lisinopril, metop - holding asa, don't see an indication for it  Procedures: none   Consultations: none  Discharge Exam: Vitals:   07/10/23 1632 07/10/23 2112  BP: 137/76 (!) 148/68  Pulse: 63 71  Resp: 20 19  Temp: 98.2 F (36.8 C) 98.2 F (36.8 C)  SpO2: 97% 95%    General exam:  kyphotic Respiratory system: rales at bases otherwise clear Cardiovascular system: S1 & S2 heard, RRR. No JVD, murmurs, rubs, gallops or clicks. No pedal edema. Gastrointestinal system: Abdomen is nondistended, soft and nontender. No organomegaly or masses felt. Normal bowel sounds heard. Central nervous system: Alert and oriented. No focal neurological deficits. Extremities: Symmetric 5 x 5 power. Skin: No rashes, lesions or ulcers Psychiatry: Judgement and insight appear normal. Mood  & affect appropriate.   Discharge Instructions   Discharge Instructions     Discharge diet:   Complete by: As directed    Dysphagia 1   Increase activity slowly   Complete by: As directed       Allergies as of 07/11/2023       Reactions   Penicillin V Potassium Other (See Comments)   Has patient had a PCN reaction causing immediate rash, facial/tongue/throat swelling, SOB or lightheadedness with hypotension: Unknown Has patient had a PCN reaction causing severe rash involving mucus membranes or skin necrosis: Unknown Has patient had a PCN reaction that required hospitalization: Unknown Has patient had a PCN reaction occurring within the last 10 years: No If all of the above answers are "NO", then may proceed with Cephalosporin use.   Sulfa Antibiotics Other (See Comments)   headache        Medication List     STOP taking these medications    aspirin EC 81 MG tablet   atorvastatin 10 MG tablet Commonly known as: LIPITOR   furosemide 20 MG tablet Commonly known as: LASIX   loratadine 10 MG tablet Commonly known as: CLARITIN   metoprolol succinate 25 MG 24 hr tablet Commonly known as: TOPROL-XL   nystatin-triamcinolone ointment Commonly known as: MYCOLOG   potassium chloride SA 20 MEQ tablet Commonly known as: KLOR-CON M   traMADol 50 MG tablet Commonly known as: Ultram       TAKE these medications    acetaminophen 500 MG tablet Commonly known as: TYLENOL Take 1,000 mg by mouth daily as needed.   B-12 SL Place 2,500 mcg under the tongue daily.   Calcium Carb-Cholecalciferol 500-200 MG-UNIT Tabs Take 1 tablet by mouth daily.   docusate sodium 100 MG capsule Commonly known as: COLACE Take 100 mg by mouth daily as needed.   levothyroxine 50 MCG tablet Commonly known as: SYNTHROID Take 50 mcg by mouth daily at 6 (six) AM.   lisinopril 10 MG tablet Commonly known as: ZESTRIL Take 1 tablet (10 mg total) by mouth daily.   Metoprolol Tartrate  37.5 MG Tabs Take 1 tablet (37.5 mg total) by mouth 2 (two) times daily.   nystatin cream Commonly known as: MYCOSTATIN Apply 1 Application topically 2 (two) times daily.   oxyCODONE 5 MG immediate release tablet Commonly known as: Oxy IR/ROXICODONE Take 1 tablet (5 mg total) by mouth every 6 (six) hours as needed for moderate pain or severe pain.   polyethylene glycol 17 g packet Commonly known as: MIRALAX / GLYCOLAX Take 34 g by mouth daily. Start taking on: July 12, 2023   senna 8.6 MG Tabs tablet Commonly known as: SENOKOT Take 1 tablet (8.6 mg total) by mouth daily. Start taking on: July 12, 2023       Allergies  Allergen Reactions   Penicillin V Potassium Other (See Comments)    Has patient had a PCN reaction causing immediate rash, facial/tongue/throat swelling, SOB or lightheadedness with hypotension: Unknown Has patient had a PCN reaction causing severe rash involving mucus membranes or skin necrosis:  Unknown Has patient had a PCN reaction that required hospitalization: Unknown Has patient had a PCN reaction occurring within the last 10 years: No If all of the above answers are "NO", then may proceed with Cephalosporin use.    Sulfa Antibiotics Other (See Comments)    headache    Contact information for follow-up providers     Gracelyn Nurse, MD Follow up.   Specialty: Internal Medicine Contact information: [address] [place] [address] [phone]         Venetia Night, MD Follow up.   Specialty: Neurosurgery Contact information: [address] [place] [address] [phone]              Contact information for after-discharge care     Destination     [facility] .   Service: Skilled Nursing Contact information: [address] [place] [address] [phone]                      The results of significant diagnostics from this  hospitalization (including imaging, microbiology, ancillary and laboratory) are listed below for reference.    Significant Diagnostic Studies: CT Angio Chest Pulmonary Embolism (PE) W or WO Contrast  Result Date: 07/09/2023 CLINICAL DATA:  Positive D-dimer. EXAM: CT ANGIOGRAPHY CHEST WITH CONTRAST TECHNIQUE: Multidetector CT imaging of the chest was performed using the standard protocol during bolus administration of intravenous contrast. Multiplanar CT image reconstructions and MIPs were obtained to evaluate the vascular anatomy. RADIATION DOSE REDUCTION: This exam was performed according to the departmental dose-optimization program which includes automated exposure control, adjustment of the mA and/or kV according to patient size and/or use of iterative reconstruction technique. CONTRAST:  75mL OMNIPAQUE IOHEXOL 350 MG/ML SOLN COMPARISON:  CT chest without contrast 07/07/2023 FINDINGS: Cardiovascular: Satisfactory opacification of the pulmonary arteries to the segmental level. No evidence of pulmonary embolism. Heart is moderately enlarged. No pericardial effusion. There are atherosclerotic calcifications of the aorta and coronary arteries. Mediastinum/Nodes: No enlarged mediastinal, hilar, or axillary lymph nodes. Thyroid gland, trachea, and esophagus demonstrate no significant findings. Lungs/Pleura: There is scarring in both lung apices. There small bilateral pleural effusions similar to prior examination. There is a small amount of atelectasis/airspace disease in the bilateral lower lobes, new/increasing from prior. Trachea and central airways are patent. There is no pneumothorax. Upper Abdomen: No acute abnormality. Musculoskeletal: Acute second, third, fourth, fifth and sixth rib fractures appear similar to the prior examination. Multiple chronic healed rib fractures are again noted bilaterally. There is accentuated kyphosis of the thoracic spine similar to prior. Chronic sternal and manubrial  fractures appear unchanged. Chronic fractures of T1, T2 and T3 are unchanged. Review of the MIP images confirms the above findings. IMPRESSION: 1. No evidence for pulmonary embolism. 2. Stable small bilateral pleural effusions. 3. New/increasing bilateral lower lobe atelectasis/airspace disease. 4. Stable cardiomegaly. 5. Aortic atherosclerosis. Aortic Atherosclerosis (ICD10-I70.0). Electronically Signed   By: Darliss Cheney M.D.   On: 07/09/2023 21:11   CT CHEST WO CONTRAST  Result Date: 07/07/2023 CLINICAL DATA:  Shortness of breath.  Pain after fall.  Hypoxia EXAM: CT CHEST WITHOUT CONTRAST TECHNIQUE: Multidetector CT imaging of the chest was performed following the standard protocol without IV contrast. RADIATION DOSE REDUCTION: This exam was performed according to the departmental dose-optimization program which includes automated exposure control, adjustment of the mA and/or kV according to patient size and/or use of iterative reconstruction technique. COMPARISON:  Chest x-ray 05/18/2023  FINDINGS: Cardiovascular: On this non IV contrast exam, the heart is borderline enlarged. Small pericardial effusion. Coronary artery calcifications are seen. The thoracic aorta has some scattered calcified plaque. The ascending aorta has a diameter 3.7 by 3.7 cm. There is also some enlargement of the central pulmonary arteries. Please correlate for any evidence of pulmonary artery hypertension. Evaluation for vascular injury is significantly limited without the advantage of IV contrast. No obvious mediastinal hematoma. Mediastinum/Nodes: There are some small less than 1 cm size but prominent mediastinal lymph nodes, nonpathologic by size criteria. Evaluation of the hilum is limited without contrast. No axillary lymph node enlargement. The thoracic esophagus is slightly patulous. Lungs/Pleura: Tiny bilateral pleural effusions. Adjacent lung opacities identified. Atelectasis is favored over infiltrate. Breathing motion  identified. No pneumothorax. Upper Abdomen: Adrenal glands are preserved in the upper abdomen. Musculoskeletal: Please see separate dictation of CT thoracic and lumbar spine examinations. Severe kyphosis again noted of the upper thoracic spine with compression deformities diffuse degenerative changes identified. Old posterior right-sided rib fractures are identified. Left-sided chronic rib fractures are also seen. There is an acute fracture involving the lateral aspect of the right second, third, fourth, fifth and sixth ribs. Slight adjacent thickening. Note is also made of a sternal fracture which is incompletely healed but is potentially more subacute to chronic please correlate with the history IMPRESSION: Multiple acute right-sided mildly displaced rib fractures. No pneumothorax. Numerous other older rib fractures. There is sternal fracture and compression deformities of the spine with kyphosis. Please correlate with separate spine CT scans. Tiny pleural effusions with the adjacent parenchymal opacities. Atelectasis versus infiltrate. Recommend follow-up. Enlarged heart with mild dilatation of the aorta and pulmonary arteries. Aortic Atherosclerosis (ICD10-I70.0) and Emphysema (ICD10-J43.9). Electronically Signed   By: Karen Kays M.D.   On: 07/07/2023 11:47   DG Shoulder Left  Result Date: 07/06/2023 CLINICAL DATA:  Pain post fall EXAM: LEFT SHOULDER - 2+ VIEW COMPARISON:  None Available. FINDINGS: AC joint appears intact. Y-views are limited due to positioning. No definite dislocation. No definitive fracture. Slight narrowing of subacromial space which could be due to rotator cuff disease. IMPRESSION: No definite acute osseous abnormality. Electronically Signed   By: Jasmine Pang M.D.   On: 07/06/2023 20:59   CT Thoracic Spine Wo Contrast  Result Date: 07/06/2023 CLINICAL DATA:  fall, thoracic spine TTP, evaluating for fracture; fall, L spine tenderness to palpation, evaluating for fractures EXAM: CT  THORACIC AND LUMBAR SPINE WITHOUT CONTRAST TECHNIQUE: Multidetector CT imaging of the thoracic and lumbar spine was performed without contrast. Multiplanar CT image reconstructions were also generated. RADIATION DOSE REDUCTION: This exam was performed according to the departmental dose-optimization program which includes automated exposure control, adjustment of the mA and/or kV according to patient size and/or use of iterative reconstruction technique. COMPARISON:  CT C-spine 07/01/2022, chest x-ray 01/08/2023 FINDINGS: CT THORACIC SPINE FINDINGS Alignment: Exaggerated kyphotic curvature centered at the T3 level in the setting of compression fractures. Vertebrae: Subacute to chronic T11 right transverse process fracture. Chronic right T12 transverse process fracture. Chronic right T5 - T7 transverse process fracture. Chronic appearing compression fracture of the T1 level with 40% vertebral body height loss, T2 level with 50% vertebral body height loss, T3 level with up to 60% vertebral body height loss. 2 mm retropulsion into the central canal at the T3 level. No definite acute displaced fracture. Paraspinal and other soft tissues: Negative. Disc levels: Grossly maintained CT LUMBAR SPINE FINDINGS Segmentation: 5 lumbar type vertebrae. Alignment: Dextroscoliosis centered at  the L2-L3 level. Vertebrae: Chronic right L1, L2, L3, L4 transverse process fracture. Moderate severe degenerative changes. No acute fracture or focal pathologic process. Paraspinal and other soft tissues: Negative. Disc levels: Multilevel moderate to severe intervertebral narrowing and vacuum phenomenon at the L1-L2, L2-L3 levels. Other: Atherosclerotic plaque.  At least 3 vessel coronary calcifications. Cardiomegaly peer Biapical pleural/pulmonary scarring. Trace right pleural effusion. Bilateral lower lobe consolidations. Old healed right rib fractures. Severe degenerative changes of the right shoulder with deformity of the right humeral  head. IMPRESSION: CT THORACIC SPINE IMPRESSION 1. No definite acute displaced fracture or traumatic listhesis of the thoracic spine. 2. Chronic appearing compression fracture of the T1 level with 40% vertebral body height loss, T2 level with 50% vertebral body height loss, T3 level with up to 60% vertebral body height loss. Associated decreased exaggerated kyphotic curvature. Correlate point tenderness to palpation to evaluate for an acute component. CT LUMBAR SPINE IMPRESSION 1. No acute displaced fracture or traumatic listhesis of the lumbar spine. Other imaging findings of potential clinical significance: 1. Trace right pleural effusion. Bilateral lower lobe atelectasis with superimposed infection not excluded-recommend correlation With chest x-ray. 2. Cardiomegaly. 3. Aortic Atherosclerosis (ICD10-I70.0) with at least 3 vessel coronary calcification. 4. Severe degenerative changes of the right shoulder. Electronically Signed   By: Tish Frederickson M.D.   On: 07/06/2023 19:50   CT Lumbar Spine Wo Contrast  Result Date: 07/06/2023 CLINICAL DATA:  fall, thoracic spine TTP, evaluating for fracture; fall, L spine tenderness to palpation, evaluating for fractures EXAM: CT THORACIC AND LUMBAR SPINE WITHOUT CONTRAST TECHNIQUE: Multidetector CT imaging of the thoracic and lumbar spine was performed without contrast. Multiplanar CT image reconstructions were also generated. RADIATION DOSE REDUCTION: This exam was performed according to the departmental dose-optimization program which includes automated exposure control, adjustment of the mA and/or kV according to patient size and/or use of iterative reconstruction technique. COMPARISON:  CT C-spine 07/01/2022, chest x-ray 01/08/2023 FINDINGS: CT THORACIC SPINE FINDINGS Alignment: Exaggerated kyphotic curvature centered at the T3 level in the setting of compression fractures. Vertebrae: Subacute to chronic T11 right transverse process fracture. Chronic right T12  transverse process fracture. Chronic right T5 - T7 transverse process fracture. Chronic appearing compression fracture of the T1 level with 40% vertebral body height loss, T2 level with 50% vertebral body height loss, T3 level with up to 60% vertebral body height loss. 2 mm retropulsion into the central canal at the T3 level. No definite acute displaced fracture. Paraspinal and other soft tissues: Negative. Disc levels: Grossly maintained CT LUMBAR SPINE FINDINGS Segmentation: 5 lumbar type vertebrae. Alignment: Dextroscoliosis centered at the L2-L3 level. Vertebrae: Chronic right L1, L2, L3, L4 transverse process fracture. Moderate severe degenerative changes. No acute fracture or focal pathologic process. Paraspinal and other soft tissues: Negative. Disc levels: Multilevel moderate to severe intervertebral narrowing and vacuum phenomenon at the L1-L2, L2-L3 levels. Other: Atherosclerotic plaque.  At least 3 vessel coronary calcifications. Cardiomegaly peer Biapical pleural/pulmonary scarring. Trace right pleural effusion. Bilateral lower lobe consolidations. Old healed right rib fractures. Severe degenerative changes of the right shoulder with deformity of the right humeral head. IMPRESSION: CT THORACIC SPINE IMPRESSION 1. No definite acute displaced fracture or traumatic listhesis of the thoracic spine. 2. Chronic appearing compression fracture of the T1 level with 40% vertebral body height loss, T2 level with 50% vertebral body height loss, T3 level with up to 60% vertebral body height loss. Associated decreased exaggerated kyphotic curvature. Correlate point tenderness to palpation to evaluate for  an acute component. CT LUMBAR SPINE IMPRESSION 1. No acute displaced fracture or traumatic listhesis of the lumbar spine. Other imaging findings of potential clinical significance: 1. Trace right pleural effusion. Bilateral lower lobe atelectasis with superimposed infection not excluded-recommend correlation With  chest x-ray. 2. Cardiomegaly. 3. Aortic Atherosclerosis (ICD10-I70.0) with at least 3 vessel coronary calcification. 4. Severe degenerative changes of the right shoulder. Electronically Signed   By: Tish Frederickson M.D.   On: 07/06/2023 19:50   CT Head Wo Contrast  Result Date: 07/06/2023 CLINICAL DATA:  Lost balance fell backwards and struck head. EXAM: CT HEAD WITHOUT CONTRAST CT CERVICAL SPINE WITHOUT CONTRAST TECHNIQUE: Multidetector CT imaging of the head and cervical spine was performed following the standard protocol without intravenous contrast. Multiplanar CT image reconstructions of the cervical spine were also generated. RADIATION DOSE REDUCTION: This exam was performed according to the departmental dose-optimization program which includes automated exposure control, adjustment of the mA and/or kV according to patient size and/or use of iterative reconstruction technique. COMPARISON:  CT brain and cervical spine 05/18/2023, 07/01/2022 FINDINGS: CT HEAD FINDINGS Brain: No acute territorial infarction, hemorrhage or intracranial mass. Mild atrophy. Extensive white matter hypodensity. Stable ventricle size Vascular: No hyperdense vessels.  Carotid vascular calcification Skull: Normal. Negative for fracture or focal lesion. Sinuses/Orbits: No acute finding. Other: None CT CERVICAL SPINE FINDINGS Alignment: Limited by habitus, osteopenia and positioning. Trace anterolisthesis C4 on C5 and C5 on C6. Skull base and vertebrae: Cranio vertebral junction appears intact. The vertebral body heights are maintained. Possible chronic fracture deformity right lamina of C7. Chronic moderate severe compression fracture of T2 as compared with June examination. Mild loss of height T1 vertebral body with suspected lucency through the vertebral body on sagittal views, series 6, image 29. Subacute to chronic left transverse process fracture at T2, series 8, image 79. Interval moderate severe compression fracture of T3 with  close to 50% loss of vertebral body height. Acute to subacute appearing left transverse process fracture at T3, series 8, image 89. Soft tissues and spinal canal: No prevertebral fluid or swelling. No visible canal hematoma. Mild retropulsion of the inferior aspect of T3 vertebral body but without significant canal stenosis. Disc levels: Multilevel degenerative changes, worst at C5-C6, C6-C7. Upper chest: Negative. Other: None IMPRESSION: 1. No CT evidence for acute intracranial abnormality. Atrophy and extensive chronic small vessel ischemic changes of the white matter. 2. Limited cervical spine CT due to habitus, osteopenia and positioning. No definite acute osseous abnormality. 3. Interval moderate severe compression fracture of T3 with mild retropulsion but no significant canal stenosis, appearance suggests acute to subacute process. Acute to subacute appearing left transverse process fracture at T3. 4. Mild loss of height T1 vertebral body with suspected anterior to posterior lucency through the vertebral body on sagittal views, suspect acute to subacute fracture. 5. Subacute to chronic left transverse process fracture at T2. Chronic moderate severe compression fracture of T2 (when compared with CT from June 2024). Electronically Signed   By: Jasmine Pang M.D.   On: 07/06/2023 18:33   CT Cervical Spine Wo Contrast  Result Date: 07/06/2023 CLINICAL DATA:  Lost balance fell backwards and struck head. EXAM: CT HEAD WITHOUT CONTRAST CT CERVICAL SPINE WITHOUT CONTRAST TECHNIQUE: Multidetector CT imaging of the head and cervical spine was performed following the standard protocol without intravenous contrast. Multiplanar CT image reconstructions of the cervical spine were also generated. RADIATION DOSE REDUCTION: This exam was performed according to the departmental dose-optimization program which includes automated  exposure control, adjustment of the mA and/or kV according to patient size and/or use of  iterative reconstruction technique. COMPARISON:  CT brain and cervical spine 05/18/2023, 07/01/2022 FINDINGS: CT HEAD FINDINGS Brain: No acute territorial infarction, hemorrhage or intracranial mass. Mild atrophy. Extensive white matter hypodensity. Stable ventricle size Vascular: No hyperdense vessels.  Carotid vascular calcification Skull: Normal. Negative for fracture or focal lesion. Sinuses/Orbits: No acute finding. Other: None CT CERVICAL SPINE FINDINGS Alignment: Limited by habitus, osteopenia and positioning. Trace anterolisthesis C4 on C5 and C5 on C6. Skull base and vertebrae: Cranio vertebral junction appears intact. The vertebral body heights are maintained. Possible chronic fracture deformity right lamina of C7. Chronic moderate severe compression fracture of T2 as compared with June examination. Mild loss of height T1 vertebral body with suspected lucency through the vertebral body on sagittal views, series 6, image 29. Subacute to chronic left transverse process fracture at T2, series 8, image 79. Interval moderate severe compression fracture of T3 with close to 50% loss of vertebral body height. Acute to subacute appearing left transverse process fracture at T3, series 8, image 89. Soft tissues and spinal canal: No prevertebral fluid or swelling. No visible canal hematoma. Mild retropulsion of the inferior aspect of T3 vertebral body but without significant canal stenosis. Disc levels: Multilevel degenerative changes, worst at C5-C6, C6-C7. Upper chest: Negative. Other: None IMPRESSION: 1. No CT evidence for acute intracranial abnormality. Atrophy and extensive chronic small vessel ischemic changes of the white matter. 2. Limited cervical spine CT due to habitus, osteopenia and positioning. No definite acute osseous abnormality. 3. Interval moderate severe compression fracture of T3 with mild retropulsion but no significant canal stenosis, appearance suggests acute to subacute process. Acute to  subacute appearing left transverse process fracture at T3. 4. Mild loss of height T1 vertebral body with suspected anterior to posterior lucency through the vertebral body on sagittal views, suspect acute to subacute fracture. 5. Subacute to chronic left transverse process fracture at T2. Chronic moderate severe compression fracture of T2 (when compared with CT from June 2024). Electronically Signed   By: Jasmine Pang M.D.   On: 07/06/2023 18:33    Microbiology: No results found for this or any previous visit (from the past 240 hour(s)).   Labs: Basic Metabolic Panel: Recent Labs  Lab 07/07/23 1032 07/09/23 0508  NA 139 136  K 4.5 4.4  CL 103 107  CO2 27 23  GLUCOSE 115* 105*  BUN 26* 30*  CREATININE 1.10* 1.03*  CALCIUM 9.0 9.0   Liver Function Tests: Recent Labs  Lab 07/07/23 1032  AST 40  ALT 19  ALKPHOS 73  BILITOT 1.4*  PROT 6.5  ALBUMIN 3.5   No results for input(s): "LIPASE", "AMYLASE" in the last 168 hours. No results for input(s): "AMMONIA" in the last 168 hours. CBC: Recent Labs  Lab 07/07/23 1032 07/08/23 0301  WBC 14.2* 12.8*  NEUTROABS  --  7.8*  HGB 15.3* 13.5  HCT 46.1* 41.2  MCV 89.3 91.2  PLT 279 240   Cardiac Enzymes: No results for input(s): "CKTOTAL", "CKMB", "CKMBINDEX", "TROPONINI" in the last 168 hours. BNP: BNP (last 3 results) Recent Labs    01/07/23 1725  BNP 291.0*    ProBNP (last 3 results) No results for input(s): "PROBNP" in the last 8760 hours.  CBG: Recent Labs  Lab 07/08/23 1603  GLUCAP 92       Signed:  Silvano Bilis MD.  [facility] 07/11/2023, 11:03 AM

## 2023-07-11 NOTE — Progress Notes (Addendum)
1233: RN attempted to call report to [facility], voicemail left requesting call back.   1315: Report given to [facility] at [facility] at

## 2023-07-11 NOTE — Care Management Important Message (Signed)
Important Message  Patient Details  Name: Olivia Lewis MRN: [id number] Date of Birth: 02/23/1929   Medicare Important Message Given:  N/A - LOS <3 / Initial given by admissions     Olegario Messier A Lawrence Mitch 07/11/2023, 11:04 AM

## 2023-07-11 NOTE — Telephone Encounter (Signed)
Patient is a new admission to [facility]. Medications reviewed. Oxycodone sent to facility pharmacy. To be seen tomorrow for hospital f/u.

## 2023-07-11 NOTE — Plan of Care (Signed)

## 2023-07-11 NOTE — Progress Notes (Signed)
Physical Therapy Treatment Patient Details Name: Olivia Lewis MRN: [id number] DOB: [birth year] Today's Date: 07/11/2023   History of Present Illness Pt is a 89 y.o. female with a past medical history of anemia, CHF, CKD, gastric reflux, hypertension, hyperlipidemia, presents to the emergency department for shortness of breath.  According to report and patient she was seen in the EDt yesterday following a fall.  Patient workup included CT images of her spine and head with no significant findings or traumatic findings.  Patient was discharged home but continues to complain of pain across the lower chest as well as SOB.  Patient started on 2L [place] due to desat.  No fever.  No reported cough.  Patient is awake alert able to give a decent history.    PT Comments  Pt just sitting in chair with RN after getting OOB upon arrival.  Ready to walk.  She is able to stand from recliner with CGA/light min a x 1.  She walks to door and back x 2 before seated rest then repeates 40' x 2 total.  She continues with balance deficits needing +1 hands on assist at all times with some post lean.  Overall does well and is motivated to increase mobility.  Remained in chair with friend in room.   If plan is discharge home, recommend the following: Direct supervision/assist for medications management;Assist for transportation;Assistance with feeding;Help with stairs or ramp for entrance;A little help with walking and/or transfers;A little help with bathing/dressing/bathroom;Assistance with cooking/housework   Can travel by private vehicle     Yes  Equipment Recommendations  Other (comment) (defer to next venue)    Recommendations for Other Services       Precautions / Restrictions Precautions Precautions: Fall Restrictions Weight Bearing Restrictions: No     Mobility  Bed Mobility               General bed mobility comments: just up to chair with RN but ready to walk Patient Response:  Cooperative  Transfers Overall transfer level: Needs assistance Equipment used: Rolling walker (2 wheels) Transfers: Sit to/from Stand Sit to Stand: Min assist, Contact guard assist                Ambulation/Gait Ambulation/Gait assistance: Min assist Gait Distance (Feet): 40 Feet Assistive device: Rolling walker (2 wheels) Gait Pattern/deviations: Decreased step length - right, Decreased step length - left, Decreased dorsiflexion - right, Decreased dorsiflexion - left, Decreased stride length Gait velocity: decreased     General Gait Details: 40' x 2 (door and back x 2, rest then repeated)   Stairs             Wheelchair Mobility     Tilt Bed Tilt Bed Patient Response: Cooperative  Modified Rankin (Stroke Patients Only)       Balance Overall balance assessment: Needs assistance Sitting-balance support: Bilateral upper extremity supported, Feet supported Sitting balance-Leahy Scale: Fair   Postural control: Posterior lean Standing balance support: Bilateral upper extremity supported, During functional activity, Reliant on assistive device for balance Standing balance-Leahy Scale: Poor Standing balance comment: Able to maintain standing balance CGA for safety but hands on assist due to post lean/instability especially with movement                            Cognition Arousal: Alert Behavior During Therapy: [facility] for tasks assessed/performed Overall Cognitive Status: Within Functional Limits for tasks assessed  General Comments: Pleasant and willing to participate        Exercises      General Comments        Pertinent Vitals/Pain Pain Assessment Pain Assessment: Faces Faces Pain Scale: Hurts even more Pain Location: c/o right arm pain and chin - RN coming with pain meds Pain Descriptors / Indicators: Discomfort, Grimacing, Guarding Pain Intervention(s): Limited activity within  patient's tolerance, Monitored during session, Repositioned, RN gave pain meds during session    Home Living                          Prior Function            PT Goals (current goals can now be found in the care plan section) Progress towards PT goals: Progressing toward goals    Frequency    Min 1X/week      PT Plan      Co-evaluation              AM-PAC PT "6 Clicks" Mobility   Outcome Measure  Help needed turning from your back to your side while in a flat bed without using bedrails?: A Little Help needed moving from lying on your back to sitting on the side of a flat bed without using bedrails?: A Lot Help needed moving to and from a bed to a chair (including a wheelchair)?: A Little Help needed standing up from a chair using your arms (e.g., wheelchair or bedside chair)?: A Little Help needed to walk in hospital room?: A Little Help needed climbing 3-5 steps with a railing? : A Lot 6 Click Score: 16    End of Session Equipment Utilized During Treatment: Oxygen Activity Tolerance: Patient tolerated treatment well Patient left: in chair;with chair alarm set;with call bell/phone within reach;with family/visitor present Nurse Communication: Mobility status PT Visit Diagnosis: Difficulty in walking, not elsewhere classified (R26.2);Other abnormalities of gait and mobility (R26.89);Muscle weakness (generalized) (M62.81);Pain Pain - Right/Left: Left Pain - part of body:  (ribs)     Time: 5284-1324 PT Time Calculation (min) (ACUTE ONLY): 11 min  Charges:    $Gait Training: 8-22 mins PT General Charges $$ ACUTE PT VISIT: 1 Visit                    Danielle Dess, PTA 07/11/23, 9:40 AM

## 2023-07-12 ENCOUNTER — Encounter: Payer: Self-pay | Admitting: Nurse Practitioner

## 2023-07-12 ENCOUNTER — Non-Acute Institutional Stay: Payer: Self-pay | Admitting: Nurse Practitioner

## 2023-07-12 DIAGNOSIS — J9601 Acute respiratory failure with hypoxia: Secondary | ICD-10-CM

## 2023-07-12 DIAGNOSIS — I5032 Chronic diastolic (congestive) heart failure: Secondary | ICD-10-CM

## 2023-07-12 DIAGNOSIS — I10 Essential (primary) hypertension: Secondary | ICD-10-CM

## 2023-07-12 DIAGNOSIS — S2241XD Multiple fractures of ribs, right side, subsequent encounter for fracture with routine healing: Secondary | ICD-10-CM | POA: Diagnosis not present

## 2023-07-12 DIAGNOSIS — B379 Candidiasis, unspecified: Secondary | ICD-10-CM

## 2023-07-12 DIAGNOSIS — K5903 Drug induced constipation: Secondary | ICD-10-CM

## 2023-07-12 DIAGNOSIS — K219 Gastro-esophageal reflux disease without esophagitis: Secondary | ICD-10-CM

## 2023-07-12 MED ORDER — FLUCONAZOLE 100 MG PO TABS: ORAL_TABLET | ORAL | Status: DC

## 2023-07-12 MED ORDER — PANTOPRAZOLE SODIUM 40 MG PO TBEC: 40.0000 mg | DELAYED_RELEASE_TABLET | Freq: Every day | ORAL | Status: DC

## 2023-07-12 NOTE — Progress Notes (Signed)
Location:  Other [place].  Nursing Home Room Number: [facility] 104A Place of Service:  SNF (31) Abbey Chatters, NP  PCP: Gracelyn Nurse, MD  Patient Care Team: Gracelyn Nurse, MD as PCP - General (Internal Medicine)  Extended Emergency Contact Information Primary Emergency Contact: [place] Mobile Phone: [phone] Relation: Friend Preferred language: English Secondary Emergency Contact: Cochran,Wilma Mobile Phone: [phone] Relation: Other  Goals of care: Advanced Directive information    07/12/2023    8:21 AM  Advanced Directives  Does Patient Have a Medical Advance Directive? Yes  Type of Estate agent of [place];Living will;Out of facility DNR (pink MOST or yellow form)  Does patient want to make changes to medical advance directive? No - Patient declined  Copy of Healthcare Power of Attorney in Chart? Yes - validated most recent copy scanned in chart (See row information)     Chief Complaint  Patient presents with   Hospitalization Follow-up    Hospital Follow up    HPI:  Pt is a 89 y.o. female seen today for a Hospital Follow up. Pt with hx of CHF, CKD, HTN, hyperlipidemia, OP, hypothyroid, kyphosis and frequent falls.  She reports she has help from ladies from her church but not 24/7 care however had a fall while caregiver was there and sent to hospital for further evaluation due to severe pain in her back.  Noted to have subacute vs acute t3 fracture, neurosurgery consulted and recommended outpatient follow up.  Had mildly displaced rib fractures on right 2-6. She is on oxycodone and tylenol for pain control and very sleepy due to regimen however without medication is in severe pain.   She has yeast under her chin, head rest on her chest most of the time due to her kyphosis.  She has also been having trouble with eating/pain in her mouth since hospitalization  Reports GERD has been bad as well. Previously on nexium but  not taking anything at this time.   Pt reports she has not had a BM in several days. Per discharge reports nursing said it had been 5 days. No BM in facility at this time. She is on miralax and senna Denies abdominal pain.    Past Medical History:  Diagnosis Date   Allergy    Anemia    Arthritis    B12 deficiency    Basal cell carcinoma    face, R arm, txted by Dr. Orson Aloe and [facility]   Cancer [facility])    skin--face and arms   Cataracts, bilateral    CHF (congestive heart failure) (HCC)    Chickenpox    CKD (chronic kidney disease), stage III (HCC)    Diverticulosis    GERD (gastroesophageal reflux disease)    Hyperlipidemia    Hypertension    Hypothyroidism    Osteoporosis    Squamous cell carcinoma of skin 05/01/2021   right distal tricep at elbow, left forearm near elbow, right dorsum hand, left mid lat forearm - all treated with EDC   Squamous cell carcinoma of skin 05/01/2021   right calf - EDC 07/14/21   Squamous cell carcinoma of skin 08/18/2021   R sup calf - ED&C   Squamous cell carcinoma of skin 12/29/2021   L mandible - ED&C   Squamous cell carcinoma of skin 12/29/2021   L lat elbow - ED&C   Squamous cell carcinoma of skin 12/29/2021   L lat tricep - ED&C   Squamous cell carcinoma of skin 05/11/2022  Right deltoid - EDC   Squamous cell carcinoma of skin 05/11/2022   Left forearm - in situ - EDC   Past Surgical History:  Procedure Laterality Date   BUNIONECTOMY Bilateral    CARDIAC CATHETERIZATION     CATARACT EXTRACTION Bilateral    DILATION AND CURETTAGE OF UTERUS     EYE SURGERY Bilateral    cataract  surgery   JOINT REPLACEMENT Right    total knee replacement   meniscus tear Right    multiple skin cancer removal     NEUROMA SURGERY Bilateral    bilateral feet   rotator cuff surgery Right    TOTAL KNEE ARTHROPLASTY Left 05/06/2015   Procedure: TOTAL KNEE ARTHROPLASTY;  Surgeon: Kennedy Bucker, MD;  Location: [facility] ORS;  Service: Orthopedics;  Laterality:  Left;    Allergies  Allergen Reactions   Penicillin V Potassium Other (See Comments)    Has patient had a PCN reaction causing immediate rash, facial/tongue/throat swelling, SOB or lightheadedness with hypotension: Unknown Has patient had a PCN reaction causing severe rash involving mucus membranes or skin necrosis: Unknown Has patient had a PCN reaction that required hospitalization: Unknown Has patient had a PCN reaction occurring within the last 10 years: No If all of the above answers are "NO", then may proceed with Cephalosporin use.    Sulfa Antibiotics Other (See Comments)    headache    Outpatient Encounter Medications as of 07/12/2023  Medication Sig   acetaminophen (TYLENOL) 500 MG tablet Take 1,000 mg by mouth daily as needed.    Calcium Carb-Cholecalciferol 500-200 MG-UNIT TABS Take 1 tablet by mouth daily.    Cyanocobalamin (B-12 SL) Place 2,500 mcg under the tongue daily.    docusate sodium (COLACE) 100 MG capsule Take 100 mg by mouth daily as needed.   levothyroxine (SYNTHROID, LEVOTHROID) 50 MCG tablet Take 50 mcg by mouth daily at 6 (six) AM.   lisinopril (ZESTRIL) 10 MG tablet Take 1 tablet (10 mg total) by mouth daily.   metoprolol tartrate 37.5 MG TABS Take 1 tablet (37.5 mg total) by mouth 2 (two) times daily.   nystatin cream (MYCOSTATIN) Apply 1 Application topically 2 (two) times daily.   oxyCODONE (OXY IR/ROXICODONE) 5 MG immediate release tablet Take 1 tablet (5 mg total) by mouth every 6 (six) hours as needed for moderate pain or severe pain.   OXYGEN 2lpm every shift continuous for hypoxia   polyethylene glycol (MIRALAX / GLYCOLAX) 17 g packet Take 34 g by mouth daily.   senna (SENOKOT) 8.6 MG TABS tablet Take 1 tablet (8.6 mg total) by mouth daily.   Zinc Oxide (TRIPLE PASTE) 12.8 % ointment Apply 1 Application topically. Every shift.   No facility-administered encounter medications on file as of 07/12/2023.    Review of Systems  Constitutional:   Positive for fatigue. Negative for activity change, appetite change and unexpected weight change.  HENT:  Negative for congestion and hearing loss.   Eyes: Negative.   Respiratory:  Positive for shortness of breath. Negative for cough.   Cardiovascular:  Negative for chest pain, palpitations and leg swelling.  Gastrointestinal:  Positive for constipation. Negative for abdominal pain and diarrhea.  Genitourinary:  Negative for difficulty urinating and dysuria.  Musculoskeletal:  Positive for gait problem and myalgias. Negative for arthralgias.  Skin:  Positive for rash. Negative for color change and wound.  Neurological:  Positive for dizziness and weakness.  Psychiatric/Behavioral:  Negative for agitation, behavioral problems and confusion.  Immunization History  Administered Date(s) Administered   Fluad Quad(high Dose 65+) 08/30/2019   Influenza Split 08/22/2014, 08/24/2017   Influenza, High Dose Seasonal PF 09/15/2018   Influenza-Unspecified 09/02/2015, 08/31/2016, 09/15/2018, 08/30/2019, 09/03/2020   PFIZER Comirnaty(Gray Top)Covid-19 Tri-Sucrose Vaccine 01/17/2020, 02/07/2020   Pneumococcal Conjugate-13 10/03/2014   Pneumococcal Polysaccharide-23 11/22/2012   Tdap 10/08/2020, 04/14/2022   Zoster Recombinant(Shingrix) 08/07/2018, 12/26/2018   Zoster, Live 11/23/2011   Pertinent  Health Maintenance Due  Topic Date Due   DEXA SCAN  Never done   INFLUENZA VACCINE  06/23/2023      08/11/2021    1:25 PM 03/02/2022    1:04 PM 04/14/2022   11:34 AM 07/01/2022    9:24 AM 09/07/2022    1:10 PM  Fall Risk  (RETIRED) Patient Fall Risk Level Moderate fall risk High fall risk High fall risk Low fall risk High fall risk   Functional Status Survey:    Vitals:   07/12/23 0812 07/12/23 0823  BP: (!) 152/81 102/73  Pulse: 78   Resp: 18   Temp: 97.8 F (36.6 C)   SpO2: 94%   Weight: 136 lb (61.7 kg)   Height: 5' (1.524 m)    Body mass index is 26.56 kg/m. Physical  Exam Constitutional:      General: She is not in acute distress.    Appearance: She is well-developed. She is not diaphoretic.  HENT:     Head: Normocephalic and atraumatic.     Mouth/Throat:     Comments: Yeast noted under chin/neck/orally. Eyes:     Conjunctiva/sclera: Conjunctivae normal.     Pupils: Pupils are equal, round, and reactive to light.  Cardiovascular:     Rate and Rhythm: Normal rate and regular rhythm.     Heart sounds: Normal heart sounds.  Pulmonary:     Effort: Pulmonary effort is normal.     Breath sounds: Normal breath sounds.  Abdominal:     General: Bowel sounds are normal.     Palpations: Abdomen is soft.  Musculoskeletal:     Cervical back: Normal range of motion and neck supple.     Right lower leg: No edema.     Left lower leg: No edema.     Comments: Severe kyphosis noted  Skin:    General: Skin is warm and dry.  Neurological:     Mental Status: She is easily aroused. She is lethargic.  Psychiatric:        Mood and Affect: Mood normal.     Labs reviewed: Recent Labs    05/18/23 1844 07/07/23 1032 07/09/23 0508  NA 137 139 136  K 3.9 4.5 4.4  CL 103 103 107  CO2 24 27 23   GLUCOSE 103* 115* 105*  BUN 30* 26* 30*  CREATININE 1.00 1.10* 1.03*  CALCIUM 9.3 9.0 9.0   Recent Labs    09/07/22 1302 01/08/23 1207 07/07/23 1032  AST 31 32 40  ALT 20 17 19   ALKPHOS 56 69 73  BILITOT 0.8 1.0 1.4*  PROT 6.9 7.0 6.5  ALBUMIN 3.9 4.1 3.5   Recent Labs    09/07/22 1302 01/07/23 1725 05/18/23 1844 07/07/23 1032 07/08/23 0301  WBC 10.2   < > 14.2* 14.2* 12.8*  NEUTROABS 6.6  --   --   --  7.8*  HGB 14.0   < > 15.1* 15.3* 13.5  HCT 42.0   < > 45.4 46.1* 41.2  MCV 89.6   < > 89.4 89.3 91.2  PLT 282   < >  266 279 240   < > = values in this interval not displayed.   Lab Results  Component Value Date   TSH 2.383 07/08/2023   Lab Results  Component Value Date   HGBA1C 6.1 (H) 04/09/2021   Lab Results  Component Value Date    CHOL 130 04/09/2021   HDL 49 04/09/2021   LDLCALC 72 04/09/2021   TRIG 47 04/09/2021   CHOLHDL 2.7 04/09/2021    Significant Diagnostic Results in last 30 days:  CT Angio Chest Pulmonary Embolism (PE) W or WO Contrast  Result Date: 07/09/2023 CLINICAL DATA:  Positive D-dimer. EXAM: CT ANGIOGRAPHY CHEST WITH CONTRAST TECHNIQUE: Multidetector CT imaging of the chest was performed using the standard protocol during bolus administration of intravenous contrast. Multiplanar CT image reconstructions and MIPs were obtained to evaluate the vascular anatomy. RADIATION DOSE REDUCTION: This exam was performed according to the departmental dose-optimization program which includes automated exposure control, adjustment of the mA and/or kV according to patient size and/or use of iterative reconstruction technique. CONTRAST:  75mL OMNIPAQUE IOHEXOL 350 MG/ML SOLN COMPARISON:  CT chest without contrast 07/07/2023 FINDINGS: Cardiovascular: Satisfactory opacification of the pulmonary arteries to the segmental level. No evidence of pulmonary embolism. Heart is moderately enlarged. No pericardial effusion. There are atherosclerotic calcifications of the aorta and coronary arteries. Mediastinum/Nodes: No enlarged mediastinal, hilar, or axillary lymph nodes. Thyroid gland, trachea, and esophagus demonstrate no significant findings. Lungs/Pleura: There is scarring in both lung apices. There small bilateral pleural effusions similar to prior examination. There is a small amount of atelectasis/airspace disease in the bilateral lower lobes, new/increasing from prior. Trachea and central airways are patent. There is no pneumothorax. Upper Abdomen: No acute abnormality. Musculoskeletal: Acute second, third, fourth, fifth and sixth rib fractures appear similar to the prior examination. Multiple chronic healed rib fractures are again noted bilaterally. There is accentuated kyphosis of the thoracic spine similar to prior. Chronic  sternal and manubrial fractures appear unchanged. Chronic fractures of T1, T2 and T3 are unchanged. Review of the MIP images confirms the above findings. IMPRESSION: 1. No evidence for pulmonary embolism. 2. Stable small bilateral pleural effusions. 3. New/increasing bilateral lower lobe atelectasis/airspace disease. 4. Stable cardiomegaly. 5. Aortic atherosclerosis. Aortic Atherosclerosis (ICD10-I70.0). Electronically Signed   By: Darliss Cheney M.D.   On: 07/09/2023 21:11   CT CHEST WO CONTRAST  Result Date: 07/07/2023 CLINICAL DATA:  Shortness of breath.  Pain after fall.  Hypoxia EXAM: CT CHEST WITHOUT CONTRAST TECHNIQUE: Multidetector CT imaging of the chest was performed following the standard protocol without IV contrast. RADIATION DOSE REDUCTION: This exam was performed according to the departmental dose-optimization program which includes automated exposure control, adjustment of the mA and/or kV according to patient size and/or use of iterative reconstruction technique. COMPARISON:  Chest x-ray 05/18/2023 FINDINGS: Cardiovascular: On this non IV contrast exam, the heart is borderline enlarged. Small pericardial effusion. Coronary artery calcifications are seen. The thoracic aorta has some scattered calcified plaque. The ascending aorta has a diameter 3.7 by 3.7 cm. There is also some enlargement of the central pulmonary arteries. Please correlate for any evidence of pulmonary artery hypertension. Evaluation for vascular injury is significantly limited without the advantage of IV contrast. No obvious mediastinal hematoma. Mediastinum/Nodes: There are some small less than 1 cm size but prominent mediastinal lymph nodes, nonpathologic by size criteria. Evaluation of the hilum is limited without contrast. No axillary lymph node enlargement. The thoracic esophagus is slightly patulous. Lungs/Pleura: Tiny bilateral pleural effusions. Adjacent lung opacities  identified. Atelectasis is favored over infiltrate.  Breathing motion identified. No pneumothorax. Upper Abdomen: Adrenal glands are preserved in the upper abdomen. Musculoskeletal: Please see separate dictation of CT thoracic and lumbar spine examinations. Severe kyphosis again noted of the upper thoracic spine with compression deformities diffuse degenerative changes identified. Old posterior right-sided rib fractures are identified. Left-sided chronic rib fractures are also seen. There is an acute fracture involving the lateral aspect of the right second, third, fourth, fifth and sixth ribs. Slight adjacent thickening. Note is also made of a sternal fracture which is incompletely healed but is potentially more subacute to chronic please correlate with the history IMPRESSION: Multiple acute right-sided mildly displaced rib fractures. No pneumothorax. Numerous other older rib fractures. There is sternal fracture and compression deformities of the spine with kyphosis. Please correlate with separate spine CT scans. Tiny pleural effusions with the adjacent parenchymal opacities. Atelectasis versus infiltrate. Recommend follow-up. Enlarged heart with mild dilatation of the aorta and pulmonary arteries. Aortic Atherosclerosis (ICD10-I70.0) and Emphysema (ICD10-J43.9). Electronically Signed   By: Karen Kays M.D.   On: 07/07/2023 11:47   DG Shoulder Left  Result Date: 07/06/2023 CLINICAL DATA:  Pain post fall EXAM: LEFT SHOULDER - 2+ VIEW COMPARISON:  None Available. FINDINGS: AC joint appears intact. Y-views are limited due to positioning. No definite dislocation. No definitive fracture. Slight narrowing of subacromial space which could be due to rotator cuff disease. IMPRESSION: No definite acute osseous abnormality. Electronically Signed   By: Jasmine Pang M.D.   On: 07/06/2023 20:59   CT Thoracic Spine Wo Contrast  Result Date: 07/06/2023 CLINICAL DATA:  fall, thoracic spine TTP, evaluating for fracture; fall, L spine tenderness to palpation, evaluating for  fractures EXAM: CT THORACIC AND LUMBAR SPINE WITHOUT CONTRAST TECHNIQUE: Multidetector CT imaging of the thoracic and lumbar spine was performed without contrast. Multiplanar CT image reconstructions were also generated. RADIATION DOSE REDUCTION: This exam was performed according to the departmental dose-optimization program which includes automated exposure control, adjustment of the mA and/or kV according to patient size and/or use of iterative reconstruction technique. COMPARISON:  CT C-spine 07/01/2022, chest x-ray 01/08/2023 FINDINGS: CT THORACIC SPINE FINDINGS Alignment: Exaggerated kyphotic curvature centered at the T3 level in the setting of compression fractures. Vertebrae: Subacute to chronic T11 right transverse process fracture. Chronic right T12 transverse process fracture. Chronic right T5 - T7 transverse process fracture. Chronic appearing compression fracture of the T1 level with 40% vertebral body height loss, T2 level with 50% vertebral body height loss, T3 level with up to 60% vertebral body height loss. 2 mm retropulsion into the central canal at the T3 level. No definite acute displaced fracture. Paraspinal and other soft tissues: Negative. Disc levels: Grossly maintained CT LUMBAR SPINE FINDINGS Segmentation: 5 lumbar type vertebrae. Alignment: Dextroscoliosis centered at the L2-L3 level. Vertebrae: Chronic right L1, L2, L3, L4 transverse process fracture. Moderate severe degenerative changes. No acute fracture or focal pathologic process. Paraspinal and other soft tissues: Negative. Disc levels: Multilevel moderate to severe intervertebral narrowing and vacuum phenomenon at the L1-L2, L2-L3 levels. Other: Atherosclerotic plaque.  At least 3 vessel coronary calcifications. Cardiomegaly peer Biapical pleural/pulmonary scarring. Trace right pleural effusion. Bilateral lower lobe consolidations. Old healed right rib fractures. Severe degenerative changes of the right shoulder with deformity of the  right humeral head. IMPRESSION: CT THORACIC SPINE IMPRESSION 1. No definite acute displaced fracture or traumatic listhesis of the thoracic spine. 2. Chronic appearing compression fracture of the T1 level with 40% vertebral body height  loss, T2 level with 50% vertebral body height loss, T3 level with up to 60% vertebral body height loss. Associated decreased exaggerated kyphotic curvature. Correlate point tenderness to palpation to evaluate for an acute component. CT LUMBAR SPINE IMPRESSION 1. No acute displaced fracture or traumatic listhesis of the lumbar spine. Other imaging findings of potential clinical significance: 1. Trace right pleural effusion. Bilateral lower lobe atelectasis with superimposed infection not excluded-recommend correlation With chest x-ray. 2. Cardiomegaly. 3. Aortic Atherosclerosis (ICD10-I70.0) with at least 3 vessel coronary calcification. 4. Severe degenerative changes of the right shoulder. Electronically Signed   By: Tish Frederickson M.D.   On: 07/06/2023 19:50   CT Lumbar Spine Wo Contrast  Result Date: 07/06/2023 CLINICAL DATA:  fall, thoracic spine TTP, evaluating for fracture; fall, L spine tenderness to palpation, evaluating for fractures EXAM: CT THORACIC AND LUMBAR SPINE WITHOUT CONTRAST TECHNIQUE: Multidetector CT imaging of the thoracic and lumbar spine was performed without contrast. Multiplanar CT image reconstructions were also generated. RADIATION DOSE REDUCTION: This exam was performed according to the departmental dose-optimization program which includes automated exposure control, adjustment of the mA and/or kV according to patient size and/or use of iterative reconstruction technique. COMPARISON:  CT C-spine 07/01/2022, chest x-ray 01/08/2023 FINDINGS: CT THORACIC SPINE FINDINGS Alignment: Exaggerated kyphotic curvature centered at the T3 level in the setting of compression fractures. Vertebrae: Subacute to chronic T11 right transverse process fracture. Chronic  right T12 transverse process fracture. Chronic right T5 - T7 transverse process fracture. Chronic appearing compression fracture of the T1 level with 40% vertebral body height loss, T2 level with 50% vertebral body height loss, T3 level with up to 60% vertebral body height loss. 2 mm retropulsion into the central canal at the T3 level. No definite acute displaced fracture. Paraspinal and other soft tissues: Negative. Disc levels: Grossly maintained CT LUMBAR SPINE FINDINGS Segmentation: 5 lumbar type vertebrae. Alignment: Dextroscoliosis centered at the L2-L3 level. Vertebrae: Chronic right L1, L2, L3, L4 transverse process fracture. Moderate severe degenerative changes. No acute fracture or focal pathologic process. Paraspinal and other soft tissues: Negative. Disc levels: Multilevel moderate to severe intervertebral narrowing and vacuum phenomenon at the L1-L2, L2-L3 levels. Other: Atherosclerotic plaque.  At least 3 vessel coronary calcifications. Cardiomegaly peer Biapical pleural/pulmonary scarring. Trace right pleural effusion. Bilateral lower lobe consolidations. Old healed right rib fractures. Severe degenerative changes of the right shoulder with deformity of the right humeral head. IMPRESSION: CT THORACIC SPINE IMPRESSION 1. No definite acute displaced fracture or traumatic listhesis of the thoracic spine. 2. Chronic appearing compression fracture of the T1 level with 40% vertebral body height loss, T2 level with 50% vertebral body height loss, T3 level with up to 60% vertebral body height loss. Associated decreased exaggerated kyphotic curvature. Correlate point tenderness to palpation to evaluate for an acute component. CT LUMBAR SPINE IMPRESSION 1. No acute displaced fracture or traumatic listhesis of the lumbar spine. Other imaging findings of potential clinical significance: 1. Trace right pleural effusion. Bilateral lower lobe atelectasis with superimposed infection not excluded-recommend correlation  With chest x-ray. 2. Cardiomegaly. 3. Aortic Atherosclerosis (ICD10-I70.0) with at least 3 vessel coronary calcification. 4. Severe degenerative changes of the right shoulder. Electronically Signed   By: Tish Frederickson M.D.   On: 07/06/2023 19:50   CT Head Wo Contrast  Result Date: 07/06/2023 CLINICAL DATA:  Lost balance fell backwards and struck head. EXAM: CT HEAD WITHOUT CONTRAST CT CERVICAL SPINE WITHOUT CONTRAST TECHNIQUE: Multidetector CT imaging of the head and cervical spine was  performed following the standard protocol without intravenous contrast. Multiplanar CT image reconstructions of the cervical spine were also generated. RADIATION DOSE REDUCTION: This exam was performed according to the departmental dose-optimization program which includes automated exposure control, adjustment of the mA and/or kV according to patient size and/or use of iterative reconstruction technique. COMPARISON:  CT brain and cervical spine 05/18/2023, 07/01/2022 FINDINGS: CT HEAD FINDINGS Brain: No acute territorial infarction, hemorrhage or intracranial mass. Mild atrophy. Extensive white matter hypodensity. Stable ventricle size Vascular: No hyperdense vessels.  Carotid vascular calcification Skull: Normal. Negative for fracture or focal lesion. Sinuses/Orbits: No acute finding. Other: None CT CERVICAL SPINE FINDINGS Alignment: Limited by habitus, osteopenia and positioning. Trace anterolisthesis C4 on C5 and C5 on C6. Skull base and vertebrae: Cranio vertebral junction appears intact. The vertebral body heights are maintained. Possible chronic fracture deformity right lamina of C7. Chronic moderate severe compression fracture of T2 as compared with June examination. Mild loss of height T1 vertebral body with suspected lucency through the vertebral body on sagittal views, series 6, image 29. Subacute to chronic left transverse process fracture at T2, series 8, image 79. Interval moderate severe compression fracture of T3  with close to 50% loss of vertebral body height. Acute to subacute appearing left transverse process fracture at T3, series 8, image 89. Soft tissues and spinal canal: No prevertebral fluid or swelling. No visible canal hematoma. Mild retropulsion of the inferior aspect of T3 vertebral body but without significant canal stenosis. Disc levels: Multilevel degenerative changes, worst at C5-C6, C6-C7. Upper chest: Negative. Other: None IMPRESSION: 1. No CT evidence for acute intracranial abnormality. Atrophy and extensive chronic small vessel ischemic changes of the white matter. 2. Limited cervical spine CT due to habitus, osteopenia and positioning. No definite acute osseous abnormality. 3. Interval moderate severe compression fracture of T3 with mild retropulsion but no significant canal stenosis, appearance suggests acute to subacute process. Acute to subacute appearing left transverse process fracture at T3. 4. Mild loss of height T1 vertebral body with suspected anterior to posterior lucency through the vertebral body on sagittal views, suspect acute to subacute fracture. 5. Subacute to chronic left transverse process fracture at T2. Chronic moderate severe compression fracture of T2 (when compared with CT from June 2024). Electronically Signed   By: Jasmine Pang M.D.   On: 07/06/2023 18:33   CT Cervical Spine Wo Contrast  Result Date: 07/06/2023 CLINICAL DATA:  Lost balance fell backwards and struck head. EXAM: CT HEAD WITHOUT CONTRAST CT CERVICAL SPINE WITHOUT CONTRAST TECHNIQUE: Multidetector CT imaging of the head and cervical spine was performed following the standard protocol without intravenous contrast. Multiplanar CT image reconstructions of the cervical spine were also generated. RADIATION DOSE REDUCTION: This exam was performed according to the departmental dose-optimization program which includes automated exposure control, adjustment of the mA and/or kV according to patient size and/or use of  iterative reconstruction technique. COMPARISON:  CT brain and cervical spine 05/18/2023, 07/01/2022 FINDINGS: CT HEAD FINDINGS Brain: No acute territorial infarction, hemorrhage or intracranial mass. Mild atrophy. Extensive white matter hypodensity. Stable ventricle size Vascular: No hyperdense vessels.  Carotid vascular calcification Skull: Normal. Negative for fracture or focal lesion. Sinuses/Orbits: No acute finding. Other: None CT CERVICAL SPINE FINDINGS Alignment: Limited by habitus, osteopenia and positioning. Trace anterolisthesis C4 on C5 and C5 on C6. Skull base and vertebrae: Cranio vertebral junction appears intact. The vertebral body heights are maintained. Possible chronic fracture deformity right lamina of C7. Chronic moderate severe compression fracture of  T2 as compared with June examination. Mild loss of height T1 vertebral body with suspected lucency through the vertebral body on sagittal views, series 6, image 29. Subacute to chronic left transverse process fracture at T2, series 8, image 79. Interval moderate severe compression fracture of T3 with close to 50% loss of vertebral body height. Acute to subacute appearing left transverse process fracture at T3, series 8, image 89. Soft tissues and spinal canal: No prevertebral fluid or swelling. No visible canal hematoma. Mild retropulsion of the inferior aspect of T3 vertebral body but without significant canal stenosis. Disc levels: Multilevel degenerative changes, worst at C5-C6, C6-C7. Upper chest: Negative. Other: None IMPRESSION: 1. No CT evidence for acute intracranial abnormality. Atrophy and extensive chronic small vessel ischemic changes of the white matter. 2. Limited cervical spine CT due to habitus, osteopenia and positioning. No definite acute osseous abnormality. 3. Interval moderate severe compression fracture of T3 with mild retropulsion but no significant canal stenosis, appearance suggests acute to subacute process. Acute to  subacute appearing left transverse process fracture at T3. 4. Mild loss of height T1 vertebral body with suspected anterior to posterior lucency through the vertebral body on sagittal views, suspect acute to subacute fracture. 5. Subacute to chronic left transverse process fracture at T2. Chronic moderate severe compression fracture of T2 (when compared with CT from June 2024). Electronically Signed   By: Jasmine Pang M.D.   On: 07/06/2023 18:33    Assessment/Plan 1. Closed fracture of multiple ribs of right side, -with increase in pain. Continues on oxycodone and tylenol to help with pain control  2. Essential hypertension -stable on current regimen  3. Acute hypoxic respiratory failure (HCC) Has required o2 since hospitalization. She has had increase in shortness of breath and desaturations noted. Chest xray without acute findings  4. Chronic heart failure with preserved ejection fraction (HCC) euvolemic  5. Drug-induced constipation Due to pan medications and immobility, Continues on miralax and senakot   6. Gastroesophageal reflux disease without esophagitis -ongoing, not controlled, will add PPI - pantoprazole (PROTONIX) 40 MG tablet; Take 1 tablet (40 mg total) by mouth daily.  7. Yeast infection Yeah noted to mouth and neck -continues on nystatin topically to under chin/neck. Staff to clean thoroughly prior to applying nystatinand to position to help with moisture control.  - fluconazole (DIFLUCAN) 100 MG tablet; 2 tablets today then daily for 4 days for oral yeast     Tavis Kring K. Biagio Borg [facility] [phone]

## 2023-07-13 ENCOUNTER — Encounter: Payer: Self-pay | Admitting: Student

## 2023-07-13 ENCOUNTER — Ambulatory Visit: Payer: Medicare Other | Admitting: Dermatology

## 2023-07-13 ENCOUNTER — Non-Acute Institutional Stay (INDEPENDENT_AMBULATORY_CARE_PROVIDER_SITE_OTHER): Payer: Medicare Other | Admitting: Student

## 2023-07-13 DIAGNOSIS — K219 Gastro-esophageal reflux disease without esophagitis: Secondary | ICD-10-CM | POA: Diagnosis not present

## 2023-07-13 DIAGNOSIS — B370 Candidal stomatitis: Secondary | ICD-10-CM

## 2023-07-13 DIAGNOSIS — K5903 Drug induced constipation: Secondary | ICD-10-CM

## 2023-07-13 DIAGNOSIS — I5032 Chronic diastolic (congestive) heart failure: Secondary | ICD-10-CM

## 2023-07-13 DIAGNOSIS — I10 Essential (primary) hypertension: Secondary | ICD-10-CM

## 2023-07-13 DIAGNOSIS — I480 Paroxysmal atrial fibrillation: Secondary | ICD-10-CM

## 2023-07-13 DIAGNOSIS — S2241XD Multiple fractures of ribs, right side, subsequent encounter for fracture with routine healing: Secondary | ICD-10-CM

## 2023-07-13 DIAGNOSIS — R1310 Dysphagia, unspecified: Secondary | ICD-10-CM

## 2023-07-13 DIAGNOSIS — I272 Pulmonary hypertension, unspecified: Secondary | ICD-10-CM

## 2023-07-13 DIAGNOSIS — E039 Hypothyroidism, unspecified: Secondary | ICD-10-CM

## 2023-07-13 DIAGNOSIS — J9601 Acute respiratory failure with hypoxia: Secondary | ICD-10-CM

## 2023-07-13 DIAGNOSIS — W19XXXD Unspecified fall, subsequent encounter: Secondary | ICD-10-CM

## 2023-07-13 DIAGNOSIS — S2241XA Multiple fractures of ribs, right side, initial encounter for closed fracture: Secondary | ICD-10-CM

## 2023-07-13 MED ORDER — OXYCODONE HCL 5 MG PO TABS
2.5000 mg | ORAL_TABLET | Freq: Four times a day (QID) | ORAL | 0 refills | Status: DC | PRN
Start: 2023-07-13 — End: 2023-07-14

## 2023-07-13 NOTE — Progress Notes (Signed)
Provider:  Dr. Earnestine Mealing Location:  Other [place].  Nursing Home Room Number: [facility] 104A Place of Service:  SNF (31)  PCP: Gracelyn Nurse, MD Patient Care Team: Gracelyn Nurse, MD as PCP - General (Internal Medicine)  Extended Emergency Contact Information Primary Emergency Contact: [place] Mobile Phone: [phone] Relation: Friend Preferred language: English Secondary Emergency Contact: Cochran,Wilma Mobile Phone: [phone] Relation: Other  Code Status: DNR Goals of Care: Advanced Directive information    07/13/2023   12:59 PM  Advanced Directives  Does Patient Have a Medical Advance Directive? Yes  Type of Estate agent of [place];Living will;Out of facility DNR (pink MOST or yellow form)  Does patient want to make changes to medical advance directive? No - Patient declined  Copy of Healthcare Power of Attorney in Chart? Yes - validated most recent copy scanned in chart (See row information)      Chief Complaint  Patient presents with   New Admit To SNF    Admission.     HPI: Patient is a 89 y.o. female seen today for admission to [facility]. Patient was admitted to the hospital after a fall. She fell and hit her back and her head and was found ot have multiple rib fractures.   Her friend/caregiver is at bedside. She lives at home alone where she has nearly 24/7 support with her four "angels" who come and help with meals and watch her to try to help prevent falls. They are no of medical or nursing background. She took care of her sister with dementia until they had to move her to a facility 4 months ago. She hopes to get stronger and be able to eat well to hopefully eventually return home. Her family member is having a meeting with her and a lawyer tomorrow to establish HCPOA/POA.   She has pain in her shoulders. She has had pain and trouble with swallowing. She had a bowl movement yesterday. She remains sleepy due to  the pain medication. 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 Her husband died in August 14, 2006. She stopped driving about 1 year ago. She manages her finances and previously was taking care of her bathing independently, but received some help with dressing.   Advance Care Planning  She states she does not think she would survive CPR and has a DNR. She states she would return to the hospital depending on the circumstances. Discussed concern with poor eating and asked if she would ever want a feeding tube she states no. Asked if she would want a breathing tube if she had worsening of breathing -- we can only go up to ~6 liters in house, she says, the machine that keeps you alive? No."    Past Medical History:  Diagnosis Date   Allergy    Anemia    Arthritis    B12 deficiency    Basal cell carcinoma    face, R arm, txted by Dr. Orson Aloe and [facility]   Cancer [facility])    skin--face and arms   Cataracts, bilateral    CHF (congestive heart failure) (HCC)    Chickenpox    CKD (chronic kidney disease), stage III (HCC)    Diverticulosis    GERD (gastroesophageal reflux disease)    Hyperlipidemia    Hypertension    Hypothyroidism    Osteoporosis    Squamous cell carcinoma of skin 05/01/2021   right distal tricep at elbow, left forearm near elbow, right dorsum hand, left mid  lat forearm - all treated with EDC   Squamous cell carcinoma of skin 05/01/2021   right calf - EDC 07/14/21   Squamous cell carcinoma of skin 08/18/2021   R sup calf - ED&C   Squamous cell carcinoma of skin 12/29/2021   L mandible - ED&C   Squamous cell carcinoma of skin 12/29/2021   L lat elbow - ED&C   Squamous cell carcinoma of skin 12/29/2021   L lat tricep - ED&C   Squamous cell carcinoma of skin 05/11/2022   Right deltoid - EDC   Squamous cell carcinoma of skin 05/11/2022   Left forearm - in situ - EDC   Past Surgical History:  Procedure Laterality Date   BUNIONECTOMY Bilateral    CARDIAC CATHETERIZATION      CATARACT EXTRACTION Bilateral    DILATION AND CURETTAGE OF UTERUS     EYE SURGERY Bilateral    cataract  surgery   JOINT REPLACEMENT Right    total knee replacement   meniscus tear Right    multiple skin cancer removal     NEUROMA SURGERY Bilateral    bilateral feet   rotator cuff surgery Right    TOTAL KNEE ARTHROPLASTY Left 05/06/2015   Procedure: TOTAL KNEE ARTHROPLASTY;  Surgeon: Kennedy Bucker, MD;  Location: [facility] ORS;  Service: Orthopedics;  Laterality: Left;    reports that she has never smoked. She has never used smokeless tobacco. She reports that she does not drink alcohol and does not use drugs. Social History   Socioeconomic History   Marital status: Widowed    Spouse name: Not on file   Number of children: Not on file   Years of education: Not on file   Highest education level: Not on file  Occupational History   Not on file  Tobacco Use   Smoking status: Never   Smokeless tobacco: Never  Vaping Use   Vaping status: Never Used  Substance and Sexual Activity   Alcohol use: No   Drug use: No   Sexual activity: Not Currently  Other Topics Concern   Not on file  Social History Narrative   Not on file   Social Determinants of Health   Financial Resource Strain: Not on file  Food Insecurity: No Food Insecurity (07/07/2023)   Hunger Vital Sign    Worried About Running Out of Food in the Last Year: Never true    Ran Out of Food in the Last Year: Never true  Transportation Needs: No Transportation Needs (07/07/2023)   PRAPARE - Administrator, Civil Service (Medical): No    Lack of Transportation (Non-Medical): No  Physical Activity: Not on file  Stress: Not on file  Social Connections: Not on file  Intimate Partner Violence: Not At Risk (07/07/2023)   Humiliation, Afraid, Rape, and Kick questionnaire    Fear of Current or Ex-Partner: No    Emotionally Abused: No    Physically Abused: No    Sexually Abused: No    Functional Status Survey:     Family History  Problem Relation Age of Onset   Hypertension Mother    Hypertension Father    Diabetes Sister    Breast cancer Sister    Colon cancer Sister    Bone cancer Brother     Health Maintenance  Topic Date Due   Medicare Annual Wellness (AWV)  Never done   DEXA SCAN  Never done   COVID-19 Vaccine (5 - 2023-24 season) 07/23/2022   INFLUENZA VACCINE  06/23/2023   DTaP/Tdap/Td (3 - Td or Tdap) 04/14/2032   Pneumonia Vaccine 71+ Years old  Completed   Zoster Vaccines- Shingrix  Completed   HPV VACCINES  Aged Out    Allergies  Allergen Reactions   Penicillin V Potassium Other (See Comments)    Has patient had a PCN reaction causing immediate rash, facial/tongue/throat swelling, SOB or lightheadedness with hypotension: Unknown Has patient had a PCN reaction causing severe rash involving mucus membranes or skin necrosis: Unknown Has patient had a PCN reaction that required hospitalization: Unknown Has patient had a PCN reaction occurring within the last 10 years: No If all of the above answers are "NO", then may proceed with Cephalosporin use.    Sulfa Antibiotics Other (See Comments)    headache    Outpatient Encounter Medications as of 07/13/2023  Medication Sig   acetaminophen (TYLENOL) 500 MG tablet Take 1,000 mg by mouth daily as needed.    Calcium Carb-Cholecalciferol 500-200 MG-UNIT TABS Take 1 tablet by mouth daily.    Cyanocobalamin (B-12 SL) Place 2,500 mcg under the tongue daily.    docusate sodium (COLACE) 100 MG capsule Take 100 mg by mouth daily as needed.   fluconazole (DIFLUCAN) 100 MG tablet 2 tablets today then daily for 4 days   levothyroxine (SYNTHROID, LEVOTHROID) 50 MCG tablet Take 50 mcg by mouth daily at 6 (six) AM.   Lidocaine 4 % GEL Apply to bilateral shoulders topically every 24 hours as needed. On 12 hours and off 12 hours.   lisinopril (ZESTRIL) 10 MG tablet Take 1 tablet (10 mg total) by mouth daily.   metoprolol tartrate 37.5 MG TABS  Take 1 tablet (37.5 mg total) by mouth 2 (two) times daily.   nystatin cream (MYCOSTATIN) Apply 1 Application topically 2 (two) times daily.   oxyCODONE (OXY IR/ROXICODONE) 5 MG immediate release tablet Take 1 tablet (5 mg total) by mouth every 6 (six) hours as needed for moderate pain or severe pain.   OXYGEN 2lpm every shift continuous for hypoxia   pantoprazole (PROTONIX) 40 MG tablet Take 1 tablet (40 mg total) by mouth daily.   polyethylene glycol (MIRALAX / GLYCOLAX) 17 g packet Take 34 g by mouth daily.   senna (SENOKOT) 8.6 MG TABS tablet Take 1 tablet (8.6 mg total) by mouth daily.   Zinc Oxide (TRIPLE PASTE) 12.8 % ointment Apply 1 Application topically. Every shift.   No facility-administered encounter medications on file as of 07/13/2023.    Review of Systems  Vitals:   07/13/23 1250  BP: 116/72  Pulse: 67  Resp: 20  Temp: 97.8 F (36.6 C)  SpO2: 94%  Weight: 136 lb (61.7 kg)  Height: 5' (1.524 m)   Body mass index is 26.56 kg/m. Physical Exam HENT:     Mouth/Throat:     Comments: Thick white plaque of the tongue and oropharynx. Neck:     Comments: Severe kyphosis with neck resting on the chest Cardiovascular:     Rate and Rhythm: Normal rate.  Pulmonary:     Comments: Bibasilar rhonchi on 2 LNC Abdominal:     General: Abdomen is flat.     Palpations: Abdomen is soft.  Skin:    Comments: Upper back and feet without wounds or injury.   Right neck with erythema present.   Neurological:     Mental Status: She is alert.     Comments: Gives her name and DOB, knows it is a rehab facility. She says it is Thursday,  August 2024. In [facility]     Labs reviewed: Basic Metabolic Panel: Recent Labs    05/18/23 1844 07/07/23 1032 07/09/23 0508  NA 137 139 136  K 3.9 4.5 4.4  CL 103 103 107  CO2 24 27 23   GLUCOSE 103* 115* 105*  BUN 30* 26* 30*  CREATININE 1.00 1.10* 1.03*  CALCIUM 9.3 9.0 9.0   Liver Function Tests: Recent Labs    09/07/22 1302  01/08/23 1207 07/07/23 1032  AST 31 32 40  ALT 20 17 19   ALKPHOS 56 69 73  BILITOT 0.8 1.0 1.4*  PROT 6.9 7.0 6.5  ALBUMIN 3.9 4.1 3.5   No results for input(s): "LIPASE", "AMYLASE" in the last 8760 hours. No results for input(s): "AMMONIA" in the last 8760 hours. CBC: Recent Labs    09/07/22 1302 01/07/23 1725 05/18/23 1844 07/07/23 1032 07/08/23 0301  WBC 10.2   < > 14.2* 14.2* 12.8*  NEUTROABS 6.6  --   --   --  7.8*  HGB 14.0   < > 15.1* 15.3* 13.5  HCT 42.0   < > 45.4 46.1* 41.2  MCV 89.6   < > 89.4 89.3 91.2  PLT 282   < > 266 279 240   < > = values in this interval not displayed.   Cardiac Enzymes: No results for input(s): "CKTOTAL", "CKMB", "CKMBINDEX", "TROPONINI" in the last 8760 hours. BNP: Invalid input(s): "POCBNP" Lab Results  Component Value Date   HGBA1C 6.1 (H) 04/09/2021   Lab Results  Component Value Date   TSH 2.383 07/08/2023   Lab Results  Component Value Date   VITAMINB12 3,207 (H) 10/11/2020   No results found for: "FOLATE" No results found for: "IRON", "TIBC", "FERRITIN"  Imaging and Procedures obtained prior to SNF admission: CT CHEST WO CONTRAST  Result Date: 07/07/2023 CLINICAL DATA:  Shortness of breath.  Pain after fall.  Hypoxia EXAM: CT CHEST WITHOUT CONTRAST TECHNIQUE: Multidetector CT imaging of the chest was performed following the standard protocol without IV contrast. RADIATION DOSE REDUCTION: This exam was performed according to the departmental dose-optimization program which includes automated exposure control, adjustment of the mA and/or kV according to patient size and/or use of iterative reconstruction technique. COMPARISON:  Chest x-ray 05/18/2023 FINDINGS: Cardiovascular: On this non IV contrast exam, the heart is borderline enlarged. Small pericardial effusion. Coronary artery calcifications are seen. The thoracic aorta has some scattered calcified plaque. The ascending aorta has a diameter 3.7 by 3.7 cm. There is also  some enlargement of the central pulmonary arteries. Please correlate for any evidence of pulmonary artery hypertension. Evaluation for vascular injury is significantly limited without the advantage of IV contrast. No obvious mediastinal hematoma. Mediastinum/Nodes: There are some small less than 1 cm size but prominent mediastinal lymph nodes, nonpathologic by size criteria. Evaluation of the hilum is limited without contrast. No axillary lymph node enlargement. The thoracic esophagus is slightly patulous. Lungs/Pleura: Tiny bilateral pleural effusions. Adjacent lung opacities identified. Atelectasis is favored over infiltrate. Breathing motion identified. No pneumothorax. Upper Abdomen: Adrenal glands are preserved in the upper abdomen. Musculoskeletal: Please see separate dictation of CT thoracic and lumbar spine examinations. Severe kyphosis again noted of the upper thoracic spine with compression deformities diffuse degenerative changes identified. Old posterior right-sided rib fractures are identified. Left-sided chronic rib fractures are also seen. There is an acute fracture involving the lateral aspect of the right second, third, fourth, fifth and sixth ribs. Slight adjacent thickening. Note is also made of a  sternal fracture which is incompletely healed but is potentially more subacute to chronic please correlate with the history IMPRESSION: Multiple acute right-sided mildly displaced rib fractures. No pneumothorax. Numerous other older rib fractures. There is sternal fracture and compression deformities of the spine with kyphosis. Please correlate with separate spine CT scans. Tiny pleural effusions with the adjacent parenchymal opacities. Atelectasis versus infiltrate. Recommend follow-up. Enlarged heart with mild dilatation of the aorta and pulmonary arteries. Aortic Atherosclerosis (ICD10-I70.0) and Emphysema (ICD10-J43.9). Electronically Signed   By: Karen Kays M.D.   On: 07/07/2023 11:47     Assessment/Plan 1. Acute hypoxic respiratory failure (HCC) Currently on 2LNC. No signs of PE or infection CT scan. Continue to monitor respiratory status.   2. Chronic heart failure with preserved ejection fraction (HCC) Weight stable. Daily weights. NAS diet.  3. Gastroesophageal reflux disease without esophagitis Some concern this is contributing to throat pain, continue protonix.   4. Drug-induced constipation BM yesterday. Continue colace, senna. Decrease oxycodone to 2.5 mg q6 hours. If necessary can change to 2.5 q4 hours, as higher doses can lead to worsening side effect profile.   5. Thrush Patient with pain in mouth and throat preventing adequate nutrition. Continue fluconazole. Protein supplementation as tolerated.   6. Closed fracture of multiple ribs of right side with routine healing, subsequent encounter Likely contributed to AHRF. Pain well-controlled at this time, however, some shoulder pain due to accessory use. Encourage use of topical lidocaine patches.   7. Dysphagia, unspecified type SLP consultation on admission.   8. Essential hypertension BP well-controlled on current regimen.   9. Fall, subsequent encounter Numerous falls in the last year -- 6 to be exact. Some with injury and others without This is the 4th ED evaluation in the year. PT/OT. Pain control as outlined.   10. Hypothyroidism, unspecified type Well-controlled. Continue levothyroxine.   11. Paroxysmal atrial fibrillation (HCC) Rate well-controlled. Denies symptoms.likely contributed to CHF. Continue metoprolol tartrat 37.5 mg BID.   12. Pulmonary hypertension (HCC) No lower extremity edema, however, rhonchi     Family/ staff Communication: Nursing  Labs/tests ordered:CMP, CBC  I spent greater than 50  minutes for the care of this patient in face to face time, chart review, clinical documentation, patient education. I spent an additional 16 minutes discussing goals of care and  advanced care planning.

## 2023-07-14 ENCOUNTER — Other Ambulatory Visit: Payer: Self-pay | Admitting: Nurse Practitioner

## 2023-07-14 DIAGNOSIS — S2241XA Multiple fractures of ribs, right side, initial encounter for closed fracture: Secondary | ICD-10-CM

## 2023-07-14 MED ORDER — OXYCODONE HCL 5 MG PO TABS
2.5000 mg | ORAL_TABLET | Freq: Four times a day (QID) | ORAL | 0 refills | Status: DC | PRN
Start: 2023-07-14 — End: 2023-08-12

## 2023-07-27 ENCOUNTER — Encounter: Payer: Self-pay | Admitting: Student

## 2023-07-27 ENCOUNTER — Other Ambulatory Visit: Payer: Self-pay | Admitting: Student

## 2023-07-27 DIAGNOSIS — R1312 Dysphagia, oropharyngeal phase: Secondary | ICD-10-CM

## 2023-07-28 ENCOUNTER — Ambulatory Visit: Payer: Medicare Other | Admitting: Orthopedic Surgery

## 2023-07-29 NOTE — Progress Notes (Deleted)
Referring Physician:  Gracelyn Nurse, MD [address] [place],  [place] [address]  Primary Physician:  Gracelyn Nurse, MD  History of Present Illness: 07/29/2023*** Olivia Lewis has a history of slkin CA, CHF, CKD 3, GERD, HTN, hyperlipidemia, hypothyroidism, osteoporosis  Seen in ED on 07/07/23 with T3 compression fracture s/p fall. Per notes, neurosurgery was consulted per admission H&P and outpatient follow up recommended. She has multiple rib fractures as well.   She is here for follow up.       Duration: *** Location: *** Quality: *** Severity: ***  Precipitating: aggravated by *** Modifying factors: made better by *** Weakness: none Timing: *** Bowel/Bladder Dysfunction: none  Conservative measures:  Physical therapy: ***  Multimodal medical therapy including regular antiinflammatories: ***  Injections: *** epidural steroid injections  Past Surgery: ***  Olivia Lewis has ***no symptoms of cervical myelopathy.  The symptoms are causing a significant impact on the patient's life.   Review of Systems:  A 10 point review of systems is negative, except for the pertinent positives and negatives detailed in the HPI.  Past Medical History: Past Medical History:  Diagnosis Date   Allergy    Anemia    Arthritis    B12 deficiency    Basal cell carcinoma    face, R arm, txted by Dr. Orson Aloe and [facility]   Cancer [facility])    skin--face and arms   Cataracts, bilateral    CHF (congestive heart failure) (HCC)    Chickenpox    CKD (chronic kidney disease), stage III (HCC)    Diverticulosis    GERD (gastroesophageal reflux disease)    Hyperlipidemia    Hypertension    Hypothyroidism    Osteoporosis    Squamous cell carcinoma of skin 05/01/2021   right distal tricep at elbow, left forearm near elbow, right dorsum hand, left mid lat forearm - all treated with EDC   Squamous cell carcinoma of skin 05/01/2021   right calf - EDC 07/14/21   Squamous cell  carcinoma of skin 08/18/2021   R sup calf - ED&C   Squamous cell carcinoma of skin 12/29/2021   L mandible - ED&C   Squamous cell carcinoma of skin 12/29/2021   L lat elbow - ED&C   Squamous cell carcinoma of skin 12/29/2021   L lat tricep - ED&C   Squamous cell carcinoma of skin 05/11/2022   Right deltoid - EDC   Squamous cell carcinoma of skin 05/11/2022   Left forearm - in situ - EDC    Past Surgical History: Past Surgical History:  Procedure Laterality Date   BUNIONECTOMY Bilateral    CARDIAC CATHETERIZATION     CATARACT EXTRACTION Bilateral    DILATION AND CURETTAGE OF UTERUS     EYE SURGERY Bilateral    cataract  surgery   JOINT REPLACEMENT Right    total knee replacement   meniscus tear Right    multiple skin cancer removal     NEUROMA SURGERY Bilateral    bilateral feet   rotator cuff surgery Right    TOTAL KNEE ARTHROPLASTY Left 05/06/2015   Procedure: TOTAL KNEE ARTHROPLASTY;  Surgeon: Kennedy Bucker, MD;  Location: [facility] ORS;  Service: Orthopedics;  Laterality: Left;    Allergies: Allergies as of 08/02/2023 - Review Complete 07/13/2023  Allergen Reaction Noted   Penicillin v potassium Other (See Comments) 04/22/2014   Sulfa antibiotics Other (See Comments) 04/22/2014    Medications: Outpatient Encounter Medications as of 08/02/2023  Medication Sig   acetaminophen (TYLENOL) 500 MG tablet Take 1,000 mg by mouth daily as needed.    Calcium Carb-Cholecalciferol 500-200 MG-UNIT TABS Take 1 tablet by mouth daily.    Cyanocobalamin (B-12 SL) Place 2,500 mcg under the tongue daily.    docusate sodium (COLACE) 100 MG capsule Take 100 mg by mouth daily as needed.   fluconazole (DIFLUCAN) 100 MG tablet 2 tablets today then daily for 4 days   levothyroxine (SYNTHROID, LEVOTHROID) 50 MCG tablet Take 50 mcg by mouth daily at 6 (six) AM.   Lidocaine 4 % GEL Apply to bilateral shoulders topically every 24 hours as needed. On 12 hours and off 12 hours.   lisinopril (ZESTRIL)  10 MG tablet Take 1 tablet (10 mg total) by mouth daily.   metoprolol tartrate 37.5 MG TABS Take 1 tablet (37.5 mg total) by mouth 2 (two) times daily.   nystatin cream (MYCOSTATIN) Apply 1 Application topically 2 (two) times daily.   oxyCODONE (OXY IR/ROXICODONE) 5 MG immediate release tablet Take 0.5 tablets (2.5 mg total) by mouth every 6 (six) hours as needed for moderate pain or severe pain.   OXYGEN 2lpm every shift continuous for hypoxia   pantoprazole (PROTONIX) 40 MG tablet Take 1 tablet (40 mg total) by mouth daily.   polyethylene glycol (MIRALAX / GLYCOLAX) 17 g packet Take 34 g by mouth daily.   senna (SENOKOT) 8.6 MG TABS tablet Take 1 tablet (8.6 mg total) by mouth daily.   Zinc Oxide (TRIPLE PASTE) 12.8 % ointment Apply 1 Application topically. Every shift.   No facility-administered encounter medications on file as of 08/02/2023.    Social History: Social History   Tobacco Use   Smoking status: Never   Smokeless tobacco: Never  Vaping Use   Vaping status: Never Used  Substance Use Topics   Alcohol use: No   Drug use: No    Family Medical History: Family History  Problem Relation Age of Onset   Hypertension Mother    Hypertension Father    Diabetes Sister    Breast cancer Sister    Colon cancer Sister    Bone cancer Brother     Physical Examination: There were no vitals filed for this visit.  General: Patient is well developed, well nourished, calm, collected, and in no apparent distress. Attention to examination is appropriate.  Respiratory: Patient is breathing without any difficulty.   NEUROLOGICAL:     Awake, alert, oriented to person, place, and time.  Speech is clear and fluent. Fund of knowledge is appropriate.   Cranial Nerves: Pupils equal round and reactive to light.  Facial tone is symmetric.    *** ROM of cervical spine *** pain *** posterior cervical tenderness. *** tenderness in bilateral trapezial region.   *** ROM of lumbar spine ***  pain *** posterior lumbar tenderness.   No abnormal lesions on exposed skin.   Strength: Side Biceps Triceps Deltoid Interossei Grip Wrist Ext. Wrist Flex.  R 5 5 5 5 5 5 5   L 5 5 5 5 5 5 5    Side Iliopsoas Quads Hamstring PF DF EHL  R 5 5 5 5 5 5   L 5 5 5 5 5 5    Reflexes are ***2+ and symmetric at the biceps, brachioradialis, patella and achilles.   Hoffman's is absent.  Clonus is not present.   Bilateral upper and lower extremity sensation is intact to light touch.     Gait is normal.   ***No difficulty  with tandem gait.    Medical Decision Making  Imaging: Xrays of cervical and thoracic spine dated ***:  ***  Radiology report for above xrays not yet available.    CT of thoracic spine dated 07/06/23:  FINDINGS: CT THORACIC SPINE FINDINGS   Alignment: Exaggerated kyphotic curvature centered at the T3 level in the setting of compression fractures.   Vertebrae: Subacute to chronic T11 right transverse process fracture. Chronic right T12 transverse process fracture. Chronic right T5 - T7 transverse process fracture. Chronic appearing compression fracture of the T1 level with 40% vertebral body height loss, T2 level with 50% vertebral body height loss, T3 level with up to 60% vertebral body height loss. 2 mm retropulsion into the central canal at the T3 level. No definite acute displaced fracture.   Paraspinal and other soft tissues: Negative.   Disc levels: Grossly maintained   IMPRESSION: CT THORACIC SPINE IMPRESSION   1. No definite acute displaced fracture or traumatic listhesis of the thoracic spine. 2. Chronic appearing compression fracture of the T1 level with 40% vertebral body height loss, T2 level with 50% vertebral body height loss, T3 level with up to 60% vertebral body height loss. Associated decreased exaggerated kyphotic curvature. Correlate point tenderness to palpation to evaluate for an acute component.     Electronically Signed   By:  Tish Frederickson M.D.   On: 07/06/2023 19:50    I have personally reviewed the images and agree with the above interpretation.  Assessment and Plan: Olivia Lewis is a pleasant 89 y.o. female has ***  Treatment options discussed with patient and following plan made:   - Order for physical therapy for *** spine ***. Patient to call to schedule appointment. *** - Continue current medications including ***. Reviewed dosing and side effects.  - Prescription for ***. Reviewed dosing and side effects. Take with food.  - Prescription for *** to take prn muscle spasms. Reviewed dosing and side effects. Discussed this can cause drowsiness.  - MRI of *** to further evaluate *** radiculopathy. No improvement time or medications (***).  - Referral to PMR at [facility] to discuss possible *** injections.  - Will schedule phone visit to review MRI results once I get them back.   I spent a total of *** minutes in face-to-face and non-face-to-face activities related to this patient's care today including review of outside records, review of imaging, review of symptoms, physical exam, discussion of differential diagnosis, discussion of treatment options, and documentation.   Thank you for involving me in the care of this patient.   Drake Leach PA-C Dept. of Neurosurgery

## 2023-08-01 ENCOUNTER — Non-Acute Institutional Stay (SKILLED_NURSING_FACILITY): Payer: Medicare Other | Admitting: Student

## 2023-08-01 ENCOUNTER — Encounter: Payer: Self-pay | Admitting: Student

## 2023-08-01 ENCOUNTER — Other Ambulatory Visit: Payer: Self-pay | Admitting: Orthopedic Surgery

## 2023-08-01 DIAGNOSIS — B370 Candidal stomatitis: Secondary | ICD-10-CM

## 2023-08-01 DIAGNOSIS — R1310 Dysphagia, unspecified: Secondary | ICD-10-CM

## 2023-08-01 DIAGNOSIS — Z66 Do not resuscitate: Secondary | ICD-10-CM

## 2023-08-01 DIAGNOSIS — S22030D Wedge compression fracture of third thoracic vertebra, subsequent encounter for fracture with routine healing: Secondary | ICD-10-CM

## 2023-08-01 NOTE — Progress Notes (Signed)
Location:  Other [facility]) Nursing Home Room Number: [place] [address] Place of Service:  SNF [phone]) Provider:  Dr. Earnestine Mealing, MD  Patient Care Team: Gracelyn Nurse, MD as PCP - General (Internal Medicine)  Extended Emergency Contact Information Primary Emergency Contact: [facility] Phone: [phone] Relation: Friend Preferred language: English Secondary Emergency Contact: Cochran,Wilma Mobile Phone: [phone] Relation: Other  Code Status:  DNR Goals of care: Advanced Directive information    08/01/2023   11:58 AM  Advanced Directives  Does Patient Have a Medical Advance Directive? Yes  Type of Estate agent of [place];Living will;Out of facility DNR (pink MOST or yellow form)  Does patient want to make changes to medical advance directive? No - Patient declined  Copy of Healthcare Power of Attorney in Chart? Yes - validated most recent copy scanned in chart (See row information)     Chief Complaint  Patient presents with   Acute Visit    acute for sore throat     HPI:  Pt is a 89 y.o. female seen today for an acute visit for dysphagia.   Patient has had burning and pain with swallowing. Scheduled barium swallow for Wednesday. She hates the pureed diet. Poor PO intake because of this. Her favorite meal is chicken tenders and french fries.   Discussed with patient the quality of live vs quantity of days. Discussed concern that if she would prefer to have the meals she enjoys with the understanding she could still have aspiration pneumonia. Importance of weighing risk and benefit.   Patient has concerns she won't be able to go home alone after this admission.   Past Medical History:  Diagnosis Date   Allergy    Anemia    Arthritis    B12 deficiency    Basal cell carcinoma    face, R arm, txted by Dr. Orson Aloe and [facility]   Cancer [facility])    skin--face and arms   Cataracts, bilateral    CHF (congestive heart  failure) (HCC)    Chickenpox    CKD (chronic kidney disease), stage III (HCC)    Diverticulosis    GERD (gastroesophageal reflux disease)    Hyperlipidemia    Hypertension    Hypothyroidism    Osteoporosis    Squamous cell carcinoma of skin 05/01/2021   right distal tricep at elbow, left forearm near elbow, right dorsum hand, left mid lat forearm - all treated with EDC   Squamous cell carcinoma of skin 05/01/2021   right calf - EDC 07/14/21   Squamous cell carcinoma of skin 08/18/2021   R sup calf - ED&C   Squamous cell carcinoma of skin 12/29/2021   L mandible - ED&C   Squamous cell carcinoma of skin 12/29/2021   L lat elbow - ED&C   Squamous cell carcinoma of skin 12/29/2021   L lat tricep - ED&C   Squamous cell carcinoma of skin 05/11/2022   Right deltoid - EDC   Squamous cell carcinoma of skin 05/11/2022   Left forearm - in situ - [facility]   Past Surgical History:  Procedure Laterality Date   BUNIONECTOMY Bilateral    CARDIAC CATHETERIZATION     CATARACT EXTRACTION Bilateral    DILATION AND CURETTAGE OF UTERUS     EYE SURGERY Bilateral    cataract  surgery   JOINT REPLACEMENT Right    total knee replacement   meniscus tear Right    multiple skin cancer removal     NEUROMA SURGERY  Bilateral    bilateral feet   rotator cuff surgery Right    TOTAL KNEE ARTHROPLASTY Left 05/06/2015   Procedure: TOTAL KNEE ARTHROPLASTY;  Surgeon: Kennedy Bucker, MD;  Location: [facility] ORS;  Service: Orthopedics;  Laterality: Left;    Allergies  Allergen Reactions   Penicillin V Potassium Other (See Comments)    Has patient had a PCN reaction causing immediate rash, facial/tongue/throat swelling, SOB or lightheadedness with hypotension: Unknown Has patient had a PCN reaction causing severe rash involving mucus membranes or skin necrosis: Unknown Has patient had a PCN reaction that required hospitalization: Unknown Has patient had a PCN reaction occurring within the last 10 years: No If all of  the above answers are "NO", then may proceed with Cephalosporin use.    Sulfa Antibiotics Other (See Comments)    headache    Outpatient Encounter Medications as of 08/01/2023  Medication Sig   acetaminophen (TYLENOL) 500 MG tablet Take 1,000 mg by mouth daily as needed.    Calcium Carb-Cholecalciferol 500-200 MG-UNIT TABS Take 1 tablet by mouth daily.    Cyanocobalamin (B-12 SL) Place 2,500 mcg under the tongue daily.    docusate sodium (COLACE) 100 MG capsule Take 100 mg by mouth daily as needed.   fluconazole (DIFLUCAN) 100 MG tablet 2 tablets today then daily for 4 days   levothyroxine (SYNTHROID, LEVOTHROID) 50 MCG tablet Take 50 mcg by mouth daily at 6 (six) AM.   Lidocaine 4 % GEL Apply to bilateral shoulders topically every 24 hours as needed. On 12 hours and off 12 hours.   lisinopril (ZESTRIL) 10 MG tablet Take 1 tablet (10 mg total) by mouth daily.   metoprolol tartrate 37.5 MG TABS Take 1 tablet (37.5 mg total) by mouth 2 (two) times daily.   nystatin cream (MYCOSTATIN) Apply 1 Application topically 2 (two) times daily.   oxyCODONE (OXY IR/ROXICODONE) 5 MG immediate release tablet Take 0.5 tablets (2.5 mg total) by mouth every 6 (six) hours as needed for moderate pain or severe pain.   OXYGEN 2lpm every shift continuous for hypoxia   pantoprazole (PROTONIX) 40 MG tablet Take 1 tablet (40 mg total) by mouth daily.   polyethylene glycol (MIRALAX / GLYCOLAX) 17 g packet Take 34 g by mouth daily.   senna (SENOKOT) 8.6 MG TABS tablet Take 1 tablet (8.6 mg total) by mouth daily.   Zinc Oxide (TRIPLE PASTE) 12.8 % ointment Apply 1 Application topically. Every shift.   No facility-administered encounter medications on file as of 08/01/2023.    Review of Systems  Immunization History  Administered Date(s) Administered   Fluad Quad(high Dose 65+) 08/30/2019   Influenza Split 08/22/2014, 08/24/2017   Influenza, High Dose Seasonal PF 09/15/2018   Influenza-Unspecified 09/02/2015,  08/31/2016, 09/15/2018, 08/30/2019, 09/03/2020   PFIZER Comirnaty(Gray Top)Covid-19 Tri-Sucrose Vaccine 01/17/2020, 02/07/2020   Pneumococcal Conjugate-13 10/03/2014   Pneumococcal Polysaccharide-23 11/22/2012   Tdap 10/08/2020, 04/14/2022   Unspecified SARS-COV-2 Vaccination 01/17/2020, 02/07/2020   Zoster Recombinant(Shingrix) 08/07/2018, 12/26/2018   Zoster, Live 11/23/2011   Pertinent  Health Maintenance Due  Topic Date Due   DEXA SCAN  Never done   INFLUENZA VACCINE  06/23/2023      08/11/2021    1:25 PM 03/02/2022    1:04 PM 04/14/2022   11:34 AM 07/01/2022    9:24 AM 09/07/2022    1:10 PM  Fall Risk  (RETIRED) Patient Fall Risk Level Moderate fall risk High fall risk High fall risk Low fall risk High fall risk  Functional Status Survey:    Vitals:   08/01/23 1153  BP: 128/77  Pulse: (!) 54  Resp: 20  Temp: (!) 97.3 F (36.3 C)  SpO2: 94%  Weight: 127 lb (57.6 kg)  Height: 5' (1.524 m)   Body mass index is 24.8 kg/m. Physical Exam Constitutional:      Appearance: Normal appearance.  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  Neurological:     Mental Status: She is alert.     Labs reviewed: Recent Labs    05/18/23 1844 07/07/23 1032 07/09/23 0508  NA 137 139 136  K 3.9 4.5 4.4  CL 103 103 107  CO2 24 27 23   GLUCOSE 103* 115* 105*  BUN 30* 26* 30*  CREATININE 1.00 1.10* 1.03*  CALCIUM 9.3 9.0 9.0   Recent Labs    09/07/22 1302 01/08/23 1207 07/07/23 1032  AST 31 32 40  ALT 20 17 19   ALKPHOS 56 69 73  BILITOT 0.8 1.0 1.4*  PROT 6.9 7.0 6.5  ALBUMIN 3.9 4.1 3.5   Recent Labs    09/07/22 1302 01/07/23 1725 05/18/23 1844 07/07/23 1032 07/08/23 0301  WBC 10.2   < > 14.2* 14.2* 12.8*  NEUTROABS 6.6  --   --   --  7.8*  HGB 14.0   < > 15.1* 15.3* 13.5  HCT 42.0   < > 45.4 46.1* 41.2  MCV 89.6   < > 89.4 89.3 91.2  PLT 282   < > 266 279 240   < > = values in this interval not  displayed.   Lab Results  Component Value Date   TSH 2.383 07/08/2023   Lab Results  Component Value Date   HGBA1C 6.1 (H) 04/09/2021   Lab Results  Component Value Date   CHOL 130 04/09/2021   HDL 49 04/09/2021   LDLCALC 72 04/09/2021   TRIG 47 04/09/2021   CHOLHDL 2.7 04/09/2021    Significant Diagnostic Results in last 30 days:  CT Angio Chest Pulmonary Embolism (PE) W or WO Contrast  Result Date: 07/09/2023 CLINICAL DATA:  Positive D-dimer. EXAM: CT ANGIOGRAPHY CHEST WITH CONTRAST TECHNIQUE: Multidetector CT imaging of the chest was performed using the standard protocol during bolus administration of intravenous contrast. Multiplanar CT image reconstructions and MIPs were obtained to evaluate the vascular anatomy. RADIATION DOSE REDUCTION: This exam was performed according to the departmental dose-optimization program which includes automated exposure control, adjustment of the mA and/or kV according to patient size and/or use of iterative reconstruction technique. CONTRAST:  75mL OMNIPAQUE IOHEXOL 350 MG/ML SOLN COMPARISON:  CT chest without contrast 07/07/2023 FINDINGS: Cardiovascular: Satisfactory opacification of the pulmonary arteries to the segmental level. No evidence of pulmonary embolism. Heart is moderately enlarged. No pericardial effusion. There are atherosclerotic calcifications of the aorta and coronary arteries. Mediastinum/Nodes: No enlarged mediastinal, hilar, or axillary lymph nodes. Thyroid gland, trachea, and esophagus demonstrate no significant findings. Lungs/Pleura: There is scarring in both lung apices. There small bilateral pleural effusions similar to prior examination. There is a small amount of atelectasis/airspace disease in the bilateral lower lobes, new/increasing from prior. Trachea and central airways are patent. There is no pneumothorax. Upper Abdomen: No acute abnormality. Musculoskeletal: Acute second, third, fourth, fifth and sixth rib fractures appear  similar to the prior examination. Multiple chronic healed rib fractures are again noted bilaterally. There is accentuated kyphosis of the thoracic spine similar to prior. Chronic sternal  and manubrial fractures appear unchanged. Chronic fractures of T1, T2 and T3 are unchanged. Review of the MIP images confirms the above findings. IMPRESSION: 1. No evidence for pulmonary embolism. 2. Stable small bilateral pleural effusions. 3. New/increasing bilateral lower lobe atelectasis/airspace disease. 4. Stable cardiomegaly. 5. Aortic atherosclerosis. Aortic Atherosclerosis (ICD10-I70.0). Electronically Signed   By: Darliss Cheney M.D.   On: 07/09/2023 21:11   CT CHEST WO CONTRAST  Result Date: 07/07/2023 CLINICAL DATA:  Shortness of breath.  Pain after fall.  Hypoxia EXAM: CT CHEST WITHOUT CONTRAST TECHNIQUE: Multidetector CT imaging of the chest was performed following the standard protocol without IV contrast. RADIATION DOSE REDUCTION: This exam was performed according to the departmental dose-optimization program which includes automated exposure control, adjustment of the mA and/or kV according to patient size and/or use of iterative reconstruction technique. COMPARISON:  Chest x-ray 05/18/2023 FINDINGS: Cardiovascular: On this non IV contrast exam, the heart is borderline enlarged. Small pericardial effusion. Coronary artery calcifications are seen. The thoracic aorta has some scattered calcified plaque. The ascending aorta has a diameter 3.7 by 3.7 cm. There is also some enlargement of the central pulmonary arteries. Please correlate for any evidence of pulmonary artery hypertension. Evaluation for vascular injury is significantly limited without the advantage of IV contrast. No obvious mediastinal hematoma. Mediastinum/Nodes: There are some small less than 1 cm size but prominent mediastinal lymph nodes, nonpathologic by size criteria. Evaluation of the hilum is limited without contrast. No axillary lymph node  enlargement. The thoracic esophagus is slightly patulous. Lungs/Pleura: Tiny bilateral pleural effusions. Adjacent lung opacities identified. Atelectasis is favored over infiltrate. Breathing motion identified. No pneumothorax. Upper Abdomen: Adrenal glands are preserved in the upper abdomen. Musculoskeletal: Please see separate dictation of CT thoracic and lumbar spine examinations. Severe kyphosis again noted of the upper thoracic spine with compression deformities diffuse degenerative changes identified. Old posterior right-sided rib fractures are identified. Left-sided chronic rib fractures are also seen. There is an acute fracture involving the lateral aspect of the right second, third, fourth, fifth and sixth ribs. Slight adjacent thickening. Note is also made of a sternal fracture which is incompletely healed but is potentially more subacute to chronic please correlate with the history IMPRESSION: Multiple acute right-sided mildly displaced rib fractures. No pneumothorax. Numerous other older rib fractures. There is sternal fracture and compression deformities of the spine with kyphosis. Please correlate with separate spine CT scans. Tiny pleural effusions with the adjacent parenchymal opacities. Atelectasis versus infiltrate. Recommend follow-up. Enlarged heart with mild dilatation of the aorta and pulmonary arteries. Aortic Atherosclerosis (ICD10-I70.0) and Emphysema (ICD10-J43.9). Electronically Signed   By: Karen Kays M.D.   On: 07/07/2023 11:47   DG Shoulder Left  Result Date: 07/06/2023 CLINICAL DATA:  Pain post fall EXAM: LEFT SHOULDER - 2+ VIEW COMPARISON:  None Available. FINDINGS: AC joint appears intact. Y-views are limited due to positioning. No definite dislocation. No definitive fracture. Slight narrowing of subacromial space which could be due to rotator cuff disease. IMPRESSION: No definite acute osseous abnormality. Electronically Signed   By: Jasmine Pang M.D.   On: 07/06/2023 20:59    CT Thoracic Spine Wo Contrast  Result Date: 07/06/2023 CLINICAL DATA:  fall, thoracic spine TTP, evaluating for fracture; fall, L spine tenderness to palpation, evaluating for fractures EXAM: CT THORACIC AND LUMBAR SPINE WITHOUT CONTRAST TECHNIQUE: Multidetector CT imaging of the thoracic and lumbar spine was performed without contrast. Multiplanar CT image reconstructions were also generated. RADIATION DOSE REDUCTION: This exam was performed according  to the departmental dose-optimization program which includes automated exposure control, adjustment of the mA and/or kV according to patient size and/or use of iterative reconstruction technique. COMPARISON:  CT C-spine 07/01/2022, chest x-ray 01/08/2023 FINDINGS: CT THORACIC SPINE FINDINGS Alignment: Exaggerated kyphotic curvature centered at the T3 level in the setting of compression fractures. Vertebrae: Subacute to chronic T11 right transverse process fracture. Chronic right T12 transverse process fracture. Chronic right T5 - T7 transverse process fracture. Chronic appearing compression fracture of the T1 level with 40% vertebral body height loss, T2 level with 50% vertebral body height loss, T3 level with up to 60% vertebral body height loss. 2 mm retropulsion into the central canal at the T3 level. No definite acute displaced fracture. Paraspinal and other soft tissues: Negative. Disc levels: Grossly maintained CT LUMBAR SPINE FINDINGS Segmentation: 5 lumbar type vertebrae. Alignment: Dextroscoliosis centered at the L2-L3 level. Vertebrae: Chronic right L1, L2, L3, L4 transverse process fracture. Moderate severe degenerative changes. No acute fracture or focal pathologic process. Paraspinal and other soft tissues: Negative. Disc levels: Multilevel moderate to severe intervertebral narrowing and vacuum phenomenon at the L1-L2, L2-L3 levels. Other: Atherosclerotic plaque.  At least 3 vessel coronary calcifications. Cardiomegaly peer Biapical  pleural/pulmonary scarring. Trace right pleural effusion. Bilateral lower lobe consolidations. Old healed right rib fractures. Severe degenerative changes of the right shoulder with deformity of the right humeral head. IMPRESSION: CT THORACIC SPINE IMPRESSION 1. No definite acute displaced fracture or traumatic listhesis of the thoracic spine. 2. Chronic appearing compression fracture of the T1 level with 40% vertebral body height loss, T2 level with 50% vertebral body height loss, T3 level with up to 60% vertebral body height loss. Associated decreased exaggerated kyphotic curvature. Correlate point tenderness to palpation to evaluate for an acute component. CT LUMBAR SPINE IMPRESSION 1. No acute displaced fracture or traumatic listhesis of the lumbar spine. Other imaging findings of potential clinical significance: 1. Trace right pleural effusion. Bilateral lower lobe atelectasis with superimposed infection not excluded-recommend correlation With chest x-ray. 2. Cardiomegaly. 3. Aortic Atherosclerosis (ICD10-I70.0) with at least 3 vessel coronary calcification. 4. Severe degenerative changes of the right shoulder. Electronically Signed   By: Tish Frederickson M.D.   On: 07/06/2023 19:50   CT Lumbar Spine Wo Contrast  Result Date: 07/06/2023 CLINICAL DATA:  fall, thoracic spine TTP, evaluating for fracture; fall, L spine tenderness to palpation, evaluating for fractures EXAM: CT THORACIC AND LUMBAR SPINE WITHOUT CONTRAST TECHNIQUE: Multidetector CT imaging of the thoracic and lumbar spine was performed without contrast. Multiplanar CT image reconstructions were also generated. RADIATION DOSE REDUCTION: This exam was performed according to the departmental dose-optimization program which includes automated exposure control, adjustment of the mA and/or kV according to patient size and/or use of iterative reconstruction technique. COMPARISON:  CT C-spine 07/01/2022, chest x-ray 01/08/2023 FINDINGS: CT THORACIC SPINE  FINDINGS Alignment: Exaggerated kyphotic curvature centered at the T3 level in the setting of compression fractures. Vertebrae: Subacute to chronic T11 right transverse process fracture. Chronic right T12 transverse process fracture. Chronic right T5 - T7 transverse process fracture. Chronic appearing compression fracture of the T1 level with 40% vertebral body height loss, T2 level with 50% vertebral body height loss, T3 level with up to 60% vertebral body height loss. 2 mm retropulsion into the central canal at the T3 level. No definite acute displaced fracture. Paraspinal and other soft tissues: Negative. Disc levels: Grossly maintained CT LUMBAR SPINE FINDINGS Segmentation: 5 lumbar type vertebrae. Alignment: Dextroscoliosis centered at the L2-L3 level. Vertebrae:  Chronic right L1, L2, L3, L4 transverse process fracture. Moderate severe degenerative changes. No acute fracture or focal pathologic process. Paraspinal and other soft tissues: Negative. Disc levels: Multilevel moderate to severe intervertebral narrowing and vacuum phenomenon at the L1-L2, L2-L3 levels. Other: Atherosclerotic plaque.  At least 3 vessel coronary calcifications. Cardiomegaly peer Biapical pleural/pulmonary scarring. Trace right pleural effusion. Bilateral lower lobe consolidations. Old healed right rib fractures. Severe degenerative changes of the right shoulder with deformity of the right humeral head. IMPRESSION: CT THORACIC SPINE IMPRESSION 1. No definite acute displaced fracture or traumatic listhesis of the thoracic spine. 2. Chronic appearing compression fracture of the T1 level with 40% vertebral body height loss, T2 level with 50% vertebral body height loss, T3 level with up to 60% vertebral body height loss. Associated decreased exaggerated kyphotic curvature. Correlate point tenderness to palpation to evaluate for an acute component. CT LUMBAR SPINE IMPRESSION 1. No acute displaced fracture or traumatic listhesis of the lumbar  spine. Other imaging findings of potential clinical significance: 1. Trace right pleural effusion. Bilateral lower lobe atelectasis with superimposed infection not excluded-recommend correlation With chest x-ray. 2. Cardiomegaly. 3. Aortic Atherosclerosis (ICD10-I70.0) with at least 3 vessel coronary calcification. 4. Severe degenerative changes of the right shoulder. Electronically Signed   By: Tish Frederickson M.D.   On: 07/06/2023 19:50   CT Head Wo Contrast  Result Date: 07/06/2023 CLINICAL DATA:  Lost balance fell backwards and struck head. EXAM: CT HEAD WITHOUT CONTRAST CT CERVICAL SPINE WITHOUT CONTRAST TECHNIQUE: Multidetector CT imaging of the head and cervical spine was performed following the standard protocol without intravenous contrast. Multiplanar CT image reconstructions of the cervical spine were also generated. RADIATION DOSE REDUCTION: This exam was performed according to the departmental dose-optimization program which includes automated exposure control, adjustment of the mA and/or kV according to patient size and/or use of iterative reconstruction technique. COMPARISON:  CT brain and cervical spine 05/18/2023, 07/01/2022 FINDINGS: CT HEAD FINDINGS Brain: No acute territorial infarction, hemorrhage or intracranial mass. Mild atrophy. Extensive white matter hypodensity. Stable ventricle size Vascular: No hyperdense vessels.  Carotid vascular calcification Skull: Normal. Negative for fracture or focal lesion. Sinuses/Orbits: No acute finding. Other: None CT CERVICAL SPINE FINDINGS Alignment: Limited by habitus, osteopenia and positioning. Trace anterolisthesis C4 on C5 and C5 on C6. Skull base and vertebrae: Cranio vertebral junction appears intact. The vertebral body heights are maintained. Possible chronic fracture deformity right lamina of C7. Chronic moderate severe compression fracture of T2 as compared with June examination. Mild loss of height T1 vertebral body with suspected lucency  through the vertebral body on sagittal views, series 6, image 29. Subacute to chronic left transverse process fracture at T2, series 8, image 79. Interval moderate severe compression fracture of T3 with close to 50% loss of vertebral body height. Acute to subacute appearing left transverse process fracture at T3, series 8, image 89. Soft tissues and spinal canal: No prevertebral fluid or swelling. No visible canal hematoma. Mild retropulsion of the inferior aspect of T3 vertebral body but without significant canal stenosis. Disc levels: Multilevel degenerative changes, worst at C5-C6, C6-C7. Upper chest: Negative. Other: None IMPRESSION: 1. No CT evidence for acute intracranial abnormality. Atrophy and extensive chronic small vessel ischemic changes of the white matter. 2. Limited cervical spine CT due to habitus, osteopenia and positioning. No definite acute osseous abnormality. 3. Interval moderate severe compression fracture of T3 with mild retropulsion but no significant canal stenosis, appearance suggests acute to subacute process. Acute to subacute  appearing left transverse process fracture at T3. 4. Mild loss of height T1 vertebral body with suspected anterior to posterior lucency through the vertebral body on sagittal views, suspect acute to subacute fracture. 5. Subacute to chronic left transverse process fracture at T2. Chronic moderate severe compression fracture of T2 (when compared with CT from June 2024). Electronically Signed   By: Jasmine Pang M.D.   On: 07/06/2023 18:33   CT Cervical Spine Wo Contrast  Result Date: 07/06/2023 CLINICAL DATA:  Lost balance fell backwards and struck head. EXAM: CT HEAD WITHOUT CONTRAST CT CERVICAL SPINE WITHOUT CONTRAST TECHNIQUE: Multidetector CT imaging of the head and cervical spine was performed following the standard protocol without intravenous contrast. Multiplanar CT image reconstructions of the cervical spine were also generated. RADIATION DOSE REDUCTION:  This exam was performed according to the departmental dose-optimization program which includes automated exposure control, adjustment of the mA and/or kV according to patient size and/or use of iterative reconstruction technique. COMPARISON:  CT brain and cervical spine 05/18/2023, 07/01/2022 FINDINGS: CT HEAD FINDINGS Brain: No acute territorial infarction, hemorrhage or intracranial mass. Mild atrophy. Extensive white matter hypodensity. Stable ventricle size Vascular: No hyperdense vessels.  Carotid vascular calcification Skull: Normal. Negative for fracture or focal lesion. Sinuses/Orbits: No acute finding. Other: None CT CERVICAL SPINE FINDINGS Alignment: Limited by habitus, osteopenia and positioning. Trace anterolisthesis C4 on C5 and C5 on C6. Skull base and vertebrae: Cranio vertebral junction appears intact. The vertebral body heights are maintained. Possible chronic fracture deformity right lamina of C7. Chronic moderate severe compression fracture of T2 as compared with June examination. Mild loss of height T1 vertebral body with suspected lucency through the vertebral body on sagittal views, series 6, image 29. Subacute to chronic left transverse process fracture at T2, series 8, image 79. Interval moderate severe compression fracture of T3 with close to 50% loss of vertebral body height. Acute to subacute appearing left transverse process fracture at T3, series 8, image 89. Soft tissues and spinal canal: No prevertebral fluid or swelling. No visible canal hematoma. Mild retropulsion of the inferior aspect of T3 vertebral body but without significant canal stenosis. Disc levels: Multilevel degenerative changes, worst at C5-C6, C6-C7. Upper chest: Negative. Other: None IMPRESSION: 1. No CT evidence for acute intracranial abnormality. Atrophy and extensive chronic small vessel ischemic changes of the white matter. 2. Limited cervical spine CT due to habitus, osteopenia and positioning. No definite acute  osseous abnormality. 3. Interval moderate severe compression fracture of T3 with mild retropulsion but no significant canal stenosis, appearance suggests acute to subacute process. Acute to subacute appearing left transverse process fracture at T3. 4. Mild loss of height T1 vertebral body with suspected anterior to posterior lucency through the vertebral body on sagittal views, suspect acute to subacute fracture. 5. Subacute to chronic left transverse process fracture at T2. Chronic moderate severe compression fracture of T2 (when compared with CT from June 2024). Electronically Signed   By: Jasmine Pang M.D.   On: 07/06/2023 18:33    Assessment/Plan Dysphagia, unspecified type  Oral candidiasis  DNR (do not resuscitate) - Plan: Do not attempt resuscitation (DNR) Patient with continued dysphagia. Brief improvement with fluconazole. Some concern this is due to persistent candidiasis. Will start weekly fluconazole for 4 additional weeks. F/u barium swallow with additional goals of care to determine patients' disposition.   Family/ staff Communication: nursing  Labs/tests ordered:  barium swallow   I spent greater than 20  minutes for the care of this  patient in face to face time, chart review, clinical documentation, patient education. I spent an additional 16 minutes discussing goals of care and advanced care planning.

## 2023-08-02 ENCOUNTER — Ambulatory Visit: Payer: Medicare Other | Admitting: Orthopedic Surgery

## 2023-08-03 ENCOUNTER — Ambulatory Visit
Admission: RE | Admit: 2023-08-03 | Discharge: 2023-08-03 | Disposition: A | Discharge status: Home / Self Care | Payer: Medicare Other | Source: Ambulatory Visit | Attending: Student | Admitting: Student

## 2023-08-03 DIAGNOSIS — A419 Sepsis, unspecified organism: Secondary | ICD-10-CM | POA: Diagnosis not present

## 2023-08-03 DIAGNOSIS — R1312 Dysphagia, oropharyngeal phase: Secondary | ICD-10-CM | POA: Insufficient documentation

## 2023-08-03 DIAGNOSIS — R0902 Hypoxemia: Secondary | ICD-10-CM | POA: Diagnosis not present

## 2023-08-03 NOTE — Progress Notes (Addendum)
Modified Barium Swallow Study  Patient Details  Name: Olivia Lewis MRN: [id number] Date of Birth: 10-24-1929  Today's Date: 08/03/2023  Modified Barium Swallow completed.  Full report located under Chart Review in the Imaging Section.  History of Present Illness Pt is a 89 y.o. female with medical history significant of Kyphosis ("sometimes wear a neck brace at home"), Gastric REFLUX, HFpEF, CKD stage III, hypertension, hyperlipidemia, osteoporosis, hypothyroidism, and frequent Falls who presents to the ED on 07/11/2023 s/p Fall, rib fxs. Pt requires support w/ all ADLs at [facility]. Pt has Severe Kyphotic positioning impacting head up position for oral intake, sitting in chair.    Clinical Impression Patient appears to present w/ mild-moderate chronic oropharyngeal phase dysphagia and Esophageal phase dysphagia. Contributing factors impacting swallowing include severe Kyphotic posture(head down position), structural age-related changes, and advance age(89yo).  Oral phase is characterized by slow mastication and A-P transfer -- suspect impact of Gravity from head down/forwad position. Slight-min residue appeared to clear w/ f/u, Dry swallow.  Swallow initiation appeared consistent at the level of the Valleculae for trial consistencies.  Pharyngeal phase is noted for functional base of tongue retraction and peristalsis w/ no overt pharyngeal residue collection post swallow. OF NOTE, suspected partial PE Segment opening/obstruction(?) appeared to contribute to decreased bolus clearance through the pharyngeal>PE Segment w/ all trials -- this resulted in potential premature closure preventing full bolus clearance through the area. A min-mod amount of the bolus material remained and/or returned into the pharynx/Pyriform Sinuses post swallow. No obvious laryngeal penetration nor aspiration occurred d/t this pharyngeal residue, although, pt is at increased risk of such to occur. Subsequent Dry swallow  appeared somewhat effective in reducing/clearing the Pyriform Sinus residue. Many of the other pharyngeal components of swallowing(epiglottic inversion, excursion, BOT retraction) were difficult to assess d/t Significantly obscured view of the pharynx d/t the Kyphotic positioning. No obvious aspiration occurred during trials given; possible slight/brief laryngeal penetration during larger sips of thin liquids (via straw) noted but no build-up w/ f/u trial. Encouraged small, single sips for best control and reduced risk for aspiration.  OF NOTE: pt exhibited Esophageal phase Dysmotility w/ retrograde flow to/through the pyriform sinuses during this study; bolus stasis fairly consistently in mid-Esophagus. This can increase risk for aspiration of REFLUX material. Due to pt's positioning and discomfort during the exam, further trials were not given and exam ended.   Pt and Family member were given Education post MBSS. Recommended strategies to include: small, single bites/sips, clear oropharyngnx w/ Dry swallow prior to taking another bite/sip, alternate food/liquid. Upright posture w/ oral intake. Oral care prior to meals to reduce oral bacterial load. STRICT REFLUX PRECAUTIONS including remaining upright for ~45-60 mins post meals. Recommend to continue to monitor for any negative sequelae from potential aspiration/REFLUX aspiration and consult MD, Palliative Care.   Factors that may increase risk of adverse event in presence of aspiration Rubye Oaks [employer] Clearance Coots 2021): Poor general health and/or compromised immunity;Limited mobility;Frail or deconditioned;Dependence for feeding and/or oral hygiene (Kyphotic positioning)   Swallow Evaluation Recommendations Recommendations: PO diet PO Diet Recommendation: Dysphagia 2 (Finely chopped);Dysphagia 3 (Mechanical soft);Thin liquids (Level 0) (trials of minced-mech soft foods at BEDSIDE w/ Facility SLP monitoring toleration and making further recommendations.) Liquid  Administration via: Straw (baseline) Medication Administration: Crushed with puree Supervision: Full assist for feeding;Full supervision/cueing for swallowing strategies Swallowing strategies  : Minimize environmental distractions;Slow rate;Small bites/sips;Multiple dry swallows after each bite/sip;Follow solids with liquids;Clear throat intermittently Postural changes: Position pt fully upright for  meals;Stay upright 30-60 min after meals;Out of bed for meals (REFLUX precs.) Oral care recommendations: Oral care BID (2x/day);Oral care before PO;Staff/trained caregiver to provide oral care Recommended consults: Consider GI consultation;Consider esophageal assessment;Consider dietitian consultation;Consider Palliative care (for further Education and Information)       Jerilynn Som, MS, CCC-SLP Speech Language Pathologist Rehab Services; [facility] [phone] (ascom) Kritika [facility] 08/03/2023,6:46 PM

## 2023-08-06 ENCOUNTER — Emergency Department: Payer: Medicare Other

## 2023-08-06 ENCOUNTER — Inpatient Hospital Stay
Admission: EM | Admit: 2023-08-06 | Discharge: 2023-08-12 | DRG: 871 | Disposition: A | Discharge status: Hospice | Payer: Medicare Other | Source: Skilled Nursing Facility | Attending: Internal Medicine | Admitting: Internal Medicine

## 2023-08-06 ENCOUNTER — Other Ambulatory Visit: Payer: Self-pay

## 2023-08-06 DIAGNOSIS — J189 Pneumonia, unspecified organism: Principal | ICD-10-CM | POA: Insufficient documentation

## 2023-08-06 DIAGNOSIS — J9601 Acute respiratory failure with hypoxia: Secondary | ICD-10-CM | POA: Diagnosis present

## 2023-08-06 DIAGNOSIS — Z833 Family history of diabetes mellitus: Secondary | ICD-10-CM | POA: Diagnosis not present

## 2023-08-06 DIAGNOSIS — Z66 Do not resuscitate: Secondary | ICD-10-CM | POA: Diagnosis present

## 2023-08-06 DIAGNOSIS — Z800 Family history of malignant neoplasm of digestive organs: Secondary | ICD-10-CM

## 2023-08-06 DIAGNOSIS — Z79890 Hormone replacement therapy (postmenopausal): Secondary | ICD-10-CM | POA: Diagnosis not present

## 2023-08-06 DIAGNOSIS — N1831 Chronic kidney disease, stage 3a: Secondary | ICD-10-CM | POA: Diagnosis present

## 2023-08-06 DIAGNOSIS — Z803 Family history of malignant neoplasm of breast: Secondary | ICD-10-CM

## 2023-08-06 DIAGNOSIS — Z8781 Personal history of (healed) traumatic fracture: Secondary | ICD-10-CM

## 2023-08-06 DIAGNOSIS — E785 Hyperlipidemia, unspecified: Secondary | ICD-10-CM | POA: Diagnosis present

## 2023-08-06 DIAGNOSIS — R6520 Severe sepsis without septic shock: Secondary | ICD-10-CM | POA: Diagnosis present

## 2023-08-06 DIAGNOSIS — Z9981 Dependence on supplemental oxygen: Secondary | ICD-10-CM

## 2023-08-06 DIAGNOSIS — G9341 Metabolic encephalopathy: Secondary | ICD-10-CM

## 2023-08-06 DIAGNOSIS — R54 Age-related physical debility: Secondary | ICD-10-CM | POA: Diagnosis present

## 2023-08-06 DIAGNOSIS — I130 Hypertensive heart and chronic kidney disease with heart failure and stage 1 through stage 4 chronic kidney disease, or unspecified chronic kidney disease: Secondary | ICD-10-CM | POA: Diagnosis present

## 2023-08-06 DIAGNOSIS — Z880 Allergy status to penicillin: Secondary | ICD-10-CM

## 2023-08-06 DIAGNOSIS — D72820 Lymphocytosis (symptomatic): Secondary | ICD-10-CM | POA: Diagnosis not present

## 2023-08-06 DIAGNOSIS — Z8249 Family history of ischemic heart disease and other diseases of the circulatory system: Secondary | ICD-10-CM

## 2023-08-06 DIAGNOSIS — R1310 Dysphagia, unspecified: Secondary | ICD-10-CM | POA: Diagnosis present

## 2023-08-06 DIAGNOSIS — E039 Hypothyroidism, unspecified: Secondary | ICD-10-CM | POA: Diagnosis present

## 2023-08-06 DIAGNOSIS — Z1152 Encounter for screening for COVID-19: Secondary | ICD-10-CM

## 2023-08-06 DIAGNOSIS — M810 Age-related osteoporosis without current pathological fracture: Secondary | ICD-10-CM | POA: Diagnosis present

## 2023-08-06 DIAGNOSIS — R296 Repeated falls: Secondary | ICD-10-CM | POA: Diagnosis present

## 2023-08-06 DIAGNOSIS — Z7189 Other specified counseling: Secondary | ICD-10-CM | POA: Diagnosis not present

## 2023-08-06 DIAGNOSIS — J690 Pneumonitis due to inhalation of food and vomit: Secondary | ICD-10-CM | POA: Diagnosis present

## 2023-08-06 DIAGNOSIS — I952 Hypotension due to drugs: Secondary | ICD-10-CM | POA: Diagnosis not present

## 2023-08-06 DIAGNOSIS — Z85828 Personal history of other malignant neoplasm of skin: Secondary | ICD-10-CM

## 2023-08-06 DIAGNOSIS — I5032 Chronic diastolic (congestive) heart failure: Secondary | ICD-10-CM | POA: Diagnosis present

## 2023-08-06 DIAGNOSIS — Z515 Encounter for palliative care: Secondary | ICD-10-CM

## 2023-08-06 DIAGNOSIS — K219 Gastro-esophageal reflux disease without esophagitis: Secondary | ICD-10-CM | POA: Diagnosis present

## 2023-08-06 DIAGNOSIS — R0902 Hypoxemia: Secondary | ICD-10-CM

## 2023-08-06 DIAGNOSIS — A419 Sepsis, unspecified organism: Principal | ICD-10-CM | POA: Diagnosis present

## 2023-08-06 DIAGNOSIS — I10 Essential (primary) hypertension: Secondary | ICD-10-CM | POA: Diagnosis not present

## 2023-08-06 DIAGNOSIS — T461X5A Adverse effect of calcium-channel blockers, initial encounter: Secondary | ICD-10-CM | POA: Diagnosis not present

## 2023-08-06 DIAGNOSIS — Z96652 Presence of left artificial knee joint: Secondary | ICD-10-CM | POA: Diagnosis present

## 2023-08-06 DIAGNOSIS — Z79899 Other long term (current) drug therapy: Secondary | ICD-10-CM

## 2023-08-06 DIAGNOSIS — J9621 Acute and chronic respiratory failure with hypoxia: Secondary | ICD-10-CM | POA: Diagnosis present

## 2023-08-06 DIAGNOSIS — I480 Paroxysmal atrial fibrillation: Secondary | ICD-10-CM | POA: Diagnosis present

## 2023-08-06 DIAGNOSIS — Z882 Allergy status to sulfonamides status: Secondary | ICD-10-CM

## 2023-08-06 DIAGNOSIS — B370 Candidal stomatitis: Secondary | ICD-10-CM | POA: Diagnosis present

## 2023-08-06 LAB — CBC
HCT: 46.7 % — ABNORMAL HIGH (ref 36.0–46.0)
Hemoglobin: 15.4 g/dL — ABNORMAL HIGH (ref 12.0–15.0)
MCH: 29.9 pg (ref 26.0–34.0)
MCHC: 33 g/dL (ref 30.0–36.0)
MCV: 90.7 fL (ref 80.0–100.0)
Platelets: 287 10*3/uL (ref 150–400)
RBC: 5.15 MIL/uL — ABNORMAL HIGH (ref 3.87–5.11)
RDW: 14.4 % (ref 11.5–15.5)
WBC: 18 10*3/uL — ABNORMAL HIGH (ref 4.0–10.5)
nRBC: 0 % (ref 0.0–0.2)

## 2023-08-06 LAB — SARS CORONAVIRUS 2 BY RT PCR: SARS Coronavirus 2 by RT PCR: NEGATIVE

## 2023-08-06 LAB — BASIC METABOLIC PANEL
Anion gap: 12 (ref 5–15)
BUN: 27 mg/dL — ABNORMAL HIGH (ref 8–23)
CO2: 30 mmol/L (ref 22–32)
Calcium: 9 mg/dL (ref 8.9–10.3)
Chloride: 95 mmol/L — ABNORMAL LOW (ref 98–111)
Creatinine, Ser: 0.94 mg/dL (ref 0.44–1.00)
GFR, Estimated: 57 mL/min — ABNORMAL LOW (ref >60)
Glucose, Bld: 111 mg/dL — ABNORMAL HIGH (ref 70–99)
Potassium: 4.6 mmol/L (ref 3.5–5.1)
Sodium: 137 mmol/L (ref 135–145)

## 2023-08-06 LAB — LACTIC ACID, PLASMA: Lactic Acid, Venous: 1.3 mmol/L (ref 0.5–1.9)

## 2023-08-06 MED ORDER — SODIUM CHLORIDE 0.9 % IV SOLN
500.0000 mg | Freq: Once | INTRAVENOUS | Status: AC
  Administered 2023-08-06: 500 mg via INTRAVENOUS
  Filled 2023-08-06: qty 5

## 2023-08-06 MED ORDER — IOHEXOL 350 MG/ML SOLN
75.0000 mL | Freq: Once | INTRAVENOUS | Status: AC | PRN
  Administered 2023-08-06: 75 mL via INTRAVENOUS

## 2023-08-06 MED ORDER — SODIUM CHLORIDE 0.9 % IV SOLN
2.0000 g | Freq: Once | INTRAVENOUS | Status: AC
  Administered 2023-08-06: 2 g via INTRAVENOUS
  Filled 2023-08-06: qty 12.5

## 2023-08-06 NOTE — ED Notes (Signed)
Pt in bed resting, family at bedside

## 2023-08-06 NOTE — Assessment & Plan Note (Addendum)
Acute respiratory failure with hypoxia History of multiple rib fractures 07/07/2023 Possible ongoing aspiration(abnormal barium swallow 9/11),  complicated by recent rib fractures O2 sat in the 60s with EMS requiring 6 L to maintain sats in the 90s, now titrated down to 2 L Cefepime, azithromycin and vancomycin if MRSA screen positive Will keep n.p.o. Antitussives, albuterol PR RN Incentive spirometer Supplemental oxygen to keep sats over 94%  SLP consult

## 2023-08-06 NOTE — Progress Notes (Signed)
IVT consult placed for PIV for CT imaging. Patient has 2 PIV's in place in bilateral AC's. Both flushed without difficulty. No blood return noted, however no infiltration was noted on either side. Left AC 20g dressing was changed and site was flushed repeatedly without difficulty. [place] used to assess both upper extremities for additional access however no other suitable veins were noted. Primary RN notified and CT to be contacted about findings.   Cherina Dhillon Loyola Mast, RN

## 2023-08-06 NOTE — ED Provider Notes (Signed)
[facility] Provider Note    Event Date/Time   First MD Initiated Contact with Patient 08/06/23 1818     (approximate)   History   Confusion   HPI  Olivia Lewis is a 89 y.o. female presents to the emergency department today from [facility] because of concerns for confusion and difficulty breathing.  Patient herself is unable to give significant history.  Family at bedside states that she was recently seen for a fall resulting in broken ribs.  When they went to visit her today they noticed that she was slightly confused and this progressed.  They also noticed that she was coughing up some thick phlegm which is unusual for her.  Patient is typically on 2 L of oxygen at [facility].     Physical Exam   Triage Vital Signs: ED Triage Vitals  Encounter Vitals Group     BP 08/06/23 1806 98/70     Systolic BP Percentile --      Diastolic BP Percentile --      Pulse Rate 08/06/23 1806 60     Resp 08/06/23 1808 (!) 22     Temp 08/06/23 1806 97.8 F (36.6 C)     Temp Source 08/06/23 1806 Oral     SpO2 08/06/23 1806 97 %     Weight --      Height --      Head Circumference --      Peak Flow --      Pain Score 08/06/23 1805 0     Pain Loc --      Pain Education --      Exclude from Growth Chart --     Most recent vital signs: Vitals:   08/06/23 1806 08/06/23 1808  BP: 98/70   Pulse: 60   Resp:  (!) 22  Temp: 97.8 F (36.6 C)   SpO2: 97%    General: Awake, alert, not completely oriented. CV:  Good peripheral perfusion. Regular rate. Resp:  Tachypnea.  Abd:  No distention. Non tender.    ED Results / Procedures / Treatments   Labs (all labs ordered are listed, but only abnormal results are displayed) Labs Reviewed  BASIC METABOLIC PANEL - Abnormal; Notable for the following components:      Result Value   Chloride 95 (*)    Glucose, Bld 111 (*)    BUN 27 (*)    GFR, Estimated 57 (*)    All other components within normal limits  CBC  - Abnormal; Notable for the following components:   WBC 18.0 (*)    RBC 5.15 (*)    Hemoglobin 15.4 (*)    HCT 46.7 (*)    All other components within normal limits  CBC WITH DIFFERENTIAL/PLATELET - Abnormal; Notable for the following components:   WBC 24.1 (*)    Neutro Abs 20.1 (*)    Monocytes Absolute 1.5 (*)    Abs Immature Granulocytes 0.16 (*)    All other components within normal limits  BASIC METABOLIC PANEL - Abnormal; Notable for the following components:   BUN 24 (*)    Calcium 7.8 (*)    All other components within normal limits  SARS CORONAVIRUS 2 BY RT PCR  CULTURE, BLOOD (ROUTINE X 2)  CULTURE, BLOOD (ROUTINE X 2)  EXPECTORATED SPUTUM ASSESSMENT W GRAM STAIN, RFLX TO RESP C  MRSA NEXT GEN BY PCR, NASAL  EXPECTORATED SPUTUM ASSESSMENT W GRAM STAIN, RFLX TO RESP C  CULTURE, RESPIRATORY W GRAM STAIN  LACTIC ACID, PLASMA  LACTIC ACID, PLASMA  MAGNESIUM  PROCALCITONIN  LEGIONELLA PNEUMOPHILA SEROGP 1 UR AG  STREP PNEUMONIAE URINARY ANTIGEN     EKG  I, Phineas Semen, attending physician, personally viewed and interpreted this EKG  EKG Time: 1814 Rate: 66 Rhythm: sinus rhythm with pacs Axis: left axis deviation Intervals: qtc 444 QRS: narrow ST changes: no st elevation Impression: abnormal ekg   RADIOLOGY I independently interpreted and visualized the CXR. My interpretation: No pneumonia Radiology interpretation:  IMPRESSION:  1. No acute airspace disease.  2. Acute to subacute displaced left lateral fourth through seventh  rib fractures.    I independently interpreted and visualized the CT angio PE. My interpretation: No large PE Radiology interpretation:  IMPRESSION:  No evidence of pulmonary emboli.    Bilateral lower lobe consolidation.    Multiple rib fractures are noted bilaterally stable from the prior  exam. Chronic thoracic compression deformities and sternal fractures  are again noted.    Aortic Atherosclerosis (ICD10-I70.0).      PROCEDURES:  Critical Care performed: Yes  CRITICAL CARE Performed by: Phineas Semen   Total critical care time: 35 minutes  Critical care time was exclusive of separately billable procedures and treating other patients.  Critical care was necessary to treat or prevent imminent or life-threatening deterioration.  Critical care was time spent personally by me on the following activities: development of treatment plan with patient and/or surrogate as well as nursing, discussions with consultants, evaluation of patient's response to treatment, examination of patient, obtaining history from patient or surrogate, ordering and performing treatments and interventions, ordering and review of laboratory studies, ordering and review of radiographic studies, pulse oximetry and re-evaluation of patient's condition.   Procedures    MEDICATIONS ORDERED IN ED: Medications - No data to display   IMPRESSION / MDM / ASSESSMENT AND PLAN / ED COURSE  I reviewed the triage vital signs and the nursing notes.                              Differential diagnosis includes, but is not limited to, pneumonia, UTI, dehydration, COVID  Patient's presentation is most consistent with acute presentation with potential threat to life or bodily function.   The patient is on the cardiac monitor to evaluate for evidence of arrhythmia and/or significant heart rate changes.  Patient presented to the emergency department today from living facility because of concerns for confusion and difficulty with breathing.  On exam patient is not completely oriented to the events.  She was found to be hypoxic on room air.  This did come up with increase of baseline oxygen.  Chest x-ray without any obvious pneumonia.  Did obtain a CT angio to evaluate for pneumonia versus PE.  While this did not show PE was concerning for consolidation bilateral lower lobes.  Patient was started on IV fluids here in the emergency  department.  Discussed with Dr. Para March with the hospitalist service who will evaluate for admission.      FINAL CLINICAL IMPRESSION(S) / ED DIAGNOSES   Final diagnoses:  Pneumonia due to infectious organism, unspecified laterality, unspecified part of lung  Hypoxia     Note:  This document was prepared using Dragon voice recognition software and may include unintentional dictation errors.    Phineas Semen, MD 08/07/23 [phone]

## 2023-08-06 NOTE — Assessment & Plan Note (Addendum)
Patient has a history of recurrent falls that are often mechanical in nature.  Until her last fall in 07/07/23 lived at home independently, now at [facility] since 07/11/2023 Fall precautions

## 2023-08-06 NOTE — ED Triage Notes (Signed)
Pt comes via EMs from [facility]  with c/o sob. Family states pt has declined. Pt states she feels she may have choked on Ensure. Stroke screen neg. Lungs wet per EMS and rales.  64% on [place] 2L per Fire. Pt now at 95-99 on 6L. BP-128/72 HR-60 CBG-117 T-97.6 SR  Pt did cough of clear sputum.

## 2023-08-06 NOTE — H&P (Signed)
History and Physical    Patient: Olivia Lewis ZHY:[id number] DOB: 20-Sep-1929 DOA: 08/06/2023 DOS: the patient was seen and examined on 08/06/2023 PCP: Gracelyn Nurse, MD  Patient coming from: Home  Chief Complaint: No chief complaint on file.   HPI: Olivia Lewis is a 89 y.o. female with medical history significant for HFpEF, CKD stage IIIa, hypertension, hyperlipidemia, osteoporosis, hypothyroidism, and frequent falls, recently hospitalized from 8/15 to 8/19 with respiratory failure secondary to multiple rib fractures from a fall, as well as chronic dysphagia s/p abnormal barium swallow on 9/11 who was sent from [facility] for evaluation of shortness of breath, productive cough and confusion that was noticed when her time church friends visited her today.  History is given by  friends at bedside, [name] and [name] who stated that they saw the patient on the day prior and she had a cough and was mildly confused but on the day of arrival she was much more confused.  They state that at baseline patient is very sharp and with it and manages her own affairs and is the power of attorney of her sister who is on the memory care unit.  On arrival of EMS she was reportedly saturating in the 60s on room air and was placed on oxygen, requiring up to 6 L to maintain sats in the 90s. ED course and data review: On arrival afebrile, BP soft at 98/70 with pulse 60, respirations 22 O2 sat 97% on O2 at 6 L.  She was eventually able to be weaned down to 2 L maintaining sats at 100. Lab: WBC 18,000 with lactic acid 1.3, hemoglobin 15, renal function at baseline, COVID-negative. EKG, personally viewed and interpreted showing sinus at 66 with no acute ST-T wave changes CTA chest negative for PE but showing bilateral lower lobe consolidation and stable rib fractures from prior exam as well as chronic thoracic compression deformities and sternal fractures Patient placed on cefepime and azithromycin Hospitalist  consulted for admission.     Past Medical History:  Diagnosis Date   Allergy    Anemia    Arthritis    B12 deficiency    Basal cell carcinoma    face, R arm, txted by Dr. Orson Aloe and [facility]   Cancer [facility])    skin--face and arms   Cataracts, bilateral    CHF (congestive heart failure) (HCC)    Chickenpox    CKD (chronic kidney disease), stage III (HCC)    Diverticulosis    GERD (gastroesophageal reflux disease)    Hyperlipidemia    Hypertension    Hypothyroidism    Osteoporosis    Squamous cell carcinoma of skin 05/01/2021   right distal tricep at elbow, left forearm near elbow, right dorsum hand, left mid lat forearm - all treated with EDC   Squamous cell carcinoma of skin 05/01/2021   right calf - EDC 07/14/21   Squamous cell carcinoma of skin 08/18/2021   R sup calf - ED&C   Squamous cell carcinoma of skin 12/29/2021   L mandible - ED&C   Squamous cell carcinoma of skin 12/29/2021   L lat elbow - ED&C   Squamous cell carcinoma of skin 12/29/2021   L lat tricep - ED&C   Squamous cell carcinoma of skin 05/11/2022   Right deltoid - EDC   Squamous cell carcinoma of skin 05/11/2022   Left forearm - in situ - [facility]   Past Surgical History:  Procedure Laterality Date   BUNIONECTOMY Bilateral  CARDIAC CATHETERIZATION     CATARACT EXTRACTION Bilateral    DILATION AND CURETTAGE OF UTERUS     EYE SURGERY Bilateral    cataract  surgery   JOINT REPLACEMENT Right    total knee replacement   meniscus tear Right    multiple skin cancer removal     NEUROMA SURGERY Bilateral    bilateral feet   rotator cuff surgery Right    TOTAL KNEE ARTHROPLASTY Left 05/06/2015   Procedure: TOTAL KNEE ARTHROPLASTY;  Surgeon: Kennedy Bucker, MD;  Location: [facility] ORS;  Service: Orthopedics;  Laterality: Left;   Social History:  reports that she has never smoked. She has never used smokeless tobacco. She reports that she does not drink alcohol and does not use drugs.  Allergies  Allergen  Reactions   Penicillin V Potassium Other (See Comments)    Has patient had a PCN reaction causing immediate rash, facial/tongue/throat swelling, SOB or lightheadedness with hypotension: Unknown Has patient had a PCN reaction causing severe rash involving mucus membranes or skin necrosis: Unknown Has patient had a PCN reaction that required hospitalization: Unknown Has patient had a PCN reaction occurring within the last 10 years: No If all of the above answers are "NO", then may proceed with Cephalosporin use.    Sulfa Antibiotics Other (See Comments)    headache    Family History  Problem Relation Age of Onset   Hypertension Mother    Hypertension Father    Diabetes Sister    Breast cancer Sister    Colon cancer Sister    Bone cancer Brother     Prior to Admission medications   Medication Sig Start Date End Date Taking? Authorizing Provider  acetaminophen (TYLENOL) 500 MG tablet Take 1,000 mg by mouth daily as needed.     [provider]  Calcium Carb-Cholecalciferol 500-200 MG-UNIT TABS Take 1 tablet by mouth daily.     [provider]  Cyanocobalamin (B-12 SL) Place 2,500 mcg under the tongue daily.     [provider]  docusate sodium (COLACE) 100 MG capsule Take 100 mg by mouth daily as needed.    [provider]  fluconazole (DIFLUCAN) 100 MG tablet 2 tablets today then daily for 4 days 07/12/23   Sharon Seller, NP  levothyroxine (SYNTHROID, LEVOTHROID) 50 MCG tablet Take 50 mcg by mouth daily at 6 (six) AM.    [provider]  Lidocaine 4 % GEL Apply to bilateral shoulders topically every 24 hours as needed. On 12 hours and off 12 hours.    [provider]  lisinopril (ZESTRIL) 10 MG tablet Take 1 tablet (10 mg total) by mouth daily. 01/07/23 01/07/24  Georga Hacking, MD  metoprolol tartrate 37.5 MG TABS Take 1 tablet (37.5 mg total) by mouth 2 (two) times daily. 07/11/23   Wouk, Wilfred Curtis, MD  nystatin cream  (MYCOSTATIN) Apply 1 Application topically 2 (two) times daily. 05/25/23 05/24/24  [provider]  oxyCODONE (OXY IR/ROXICODONE) 5 MG immediate release tablet Take 0.5 tablets (2.5 mg total) by mouth every 6 (six) hours as needed for moderate pain or severe pain. 07/14/23   Sharon Seller, NP  OXYGEN 2lpm every shift continuous for hypoxia    [provider]  pantoprazole (PROTONIX) 40 MG tablet Take 1 tablet (40 mg total) by mouth daily. 07/12/23   Sharon Seller, NP  polyethylene glycol (MIRALAX / GLYCOLAX) 17 g packet Take 34 g by mouth daily. 07/12/23   Shonna Chock  Bedford, MD  senna (SENOKOT) 8.6 MG TABS tablet Take 1 tablet (8.6 mg total) by mouth daily. 07/12/23   Wouk, Wilfred Curtis, MD  Zinc Oxide (TRIPLE PASTE) 12.8 % ointment Apply 1 Application topically. Every shift.    [provider]    Physical Exam: Vitals:   08/06/23 1950 08/06/23 2115 08/06/23 2130 08/06/23 2312  BP:   138/77   Pulse: 67  64   Resp: (!) 21  17   Temp:      TempSrc:      SpO2: 100% 100% 100% 100%   Physical Exam Vitals and nursing note reviewed.  Constitutional:      General: She is not in acute distress.    Comments: Frail-appearing elderly female  HENT:     Head: Normocephalic and atraumatic.  Cardiovascular:     Rate and Rhythm: Normal rate and regular rhythm.     Heart sounds: Normal heart sounds.  Pulmonary:     Effort: Tachypnea present.     Breath sounds: Normal breath sounds.  Abdominal:     Palpations: Abdomen is soft.     Tenderness: There is no abdominal tenderness.  Neurological:     Mental Status: She is disoriented.     Labs on Admission: I have personally reviewed following labs and imaging studies  CBC: Recent Labs  Lab 08/06/23 1808  WBC 18.0*  HGB 15.4*  HCT 46.7*  MCV 90.7  PLT 287   Basic Metabolic Panel: Recent Labs  Lab 08/06/23 1808  NA 137  K 4.6  CL 95*  CO2 30  GLUCOSE 111*  BUN 27*  CREATININE 0.94  CALCIUM 9.0    GFR: Estimated Creatinine Clearance: 30.3 mL/min (by C-G formula based on SCr of 0.94 mg/dL). Liver Function Tests: No results for input(s): "AST", "ALT", "ALKPHOS", "BILITOT", "PROT", "ALBUMIN" in the last 168 hours. No results for input(s): "LIPASE", "AMYLASE" in the last 168 hours. No results for input(s): "AMMONIA" in the last 168 hours. Coagulation Profile: No results for input(s): "INR", "PROTIME" in the last 168 hours. Cardiac Enzymes: No results for input(s): "CKTOTAL", "CKMB", "CKMBINDEX", "TROPONINI" in the last 168 hours. BNP (last 3 results) No results for input(s): "PROBNP" in the last 8760 hours. HbA1C: No results for input(s): "HGBA1C" in the last 72 hours. CBG: No results for input(s): "GLUCAP" in the last 168 hours. Lipid Profile: No results for input(s): "CHOL", "HDL", "LDLCALC", "TRIG", "CHOLHDL", "LDLDIRECT" in the last 72 hours. Thyroid Function Tests: No results for input(s): "TSH", "T4TOTAL", "FREET4", "T3FREE", "THYROIDAB" in the last 72 hours. Anemia Panel: No results for input(s): "VITAMINB12", "FOLATE", "FERRITIN", "TIBC", "IRON", "RETICCTPCT" in the last 72 hours. Urine analysis:    Component Value Date/Time   COLORURINE YELLOW (A) 01/08/2023 1734   APPEARANCEUR HAZY (A) 01/08/2023 1734   LABSPEC 1.023 01/08/2023 1734   PHURINE 5.0 01/08/2023 1734   GLUCOSEU NEGATIVE 01/08/2023 1734   HGBUR NEGATIVE 01/08/2023 1734   BILIRUBINUR NEGATIVE 01/08/2023 1734   KETONESUR NEGATIVE 01/08/2023 1734   PROTEINUR 30 (A) 01/08/2023 1734   NITRITE POSITIVE (A) 01/08/2023 1734   LEUKOCYTESUR NEGATIVE 01/08/2023 1734    Radiological Exams on Admission: CT Angio Chest PE W and/or Wo Contrast  Result Date: 08/06/2023 CLINICAL DATA:  Hypoxia and shortness of breath EXAM: CT ANGIOGRAPHY CHEST WITH CONTRAST TECHNIQUE: Multidetector CT imaging of the chest was performed using the standard protocol during bolus administration of intravenous contrast. Multiplanar CT  image reconstructions and MIPs were obtained to evaluate the vascular anatomy.  RADIATION DOSE REDUCTION: This exam was performed according to the departmental dose-optimization program which includes automated exposure control, adjustment of the mA and/or kV according to patient size and/or use of iterative reconstruction technique. CONTRAST:  75mL OMNIPAQUE IOHEXOL 350 MG/ML SOLN COMPARISON:  Chest x-ray from earlier in the same day. CT from 07/09/2023 FINDINGS: Cardiovascular: Atherosclerotic calcifications of the thoracic aorta are noted. The degree of opacification is limited precluding evaluation for dissection. No aneurysmal dilatation is seen. Coronary calcifications are noted. The pulmonary artery shows a normal branching pattern bilaterally. No filling defect to suggest pulmonary embolism is identified. Mediastinum/Nodes: Thoracic inlet is within normal limits. No hilar or mediastinal adenopathy is noted. The esophagus as visualized is within normal limits. Lungs/Pleura: Mild lower lobe consolidation is noted bilaterally. No bronchial obstructive changes are seen. No focal nodule or sizable pleural effusion is seen. Upper Abdomen: Visualized upper abdomen is within normal limits. Musculoskeletal: Degenerative changes of the thoracic spine are noted. Multiple left-sided rib fractures are again seen stable from the prior study. No underlying pneumothorax is seen. Right-sided rib fractures are noted as well. Increased kyphosis is noted with upper thoracic compression deformity stable from the prior exam. Chronic manubrial and sternal fractures are seen. Review of the MIP images confirms the above findings. IMPRESSION: No evidence of pulmonary emboli. Bilateral lower lobe consolidation. Multiple rib fractures are noted bilaterally stable from the prior exam. Chronic thoracic compression deformities and sternal fractures are again noted. Aortic Atherosclerosis (ICD10-I70.0). Electronically Signed   By: Alcide Clever M.D.   On: 08/06/2023 23:00   DG Chest 1 View  Result Date: 08/06/2023 CLINICAL DATA:  Short of breath, possible aspiration EXAM: CHEST  1 VIEW COMPARISON:  05/18/2023, 07/09/2023 FINDINGS: 2 frontal views of the chest demonstrates stable enlargement of the cardiac silhouette. No acute airspace disease, effusion, or pneumothorax. Displaced left lateral fourth through seventh rib fractures are noted. Multiple prior healed bilateral rib fractures are again noted. Severe degenerative changes of the right shoulder. IMPRESSION: 1. No acute airspace disease. 2. Acute to subacute displaced left lateral fourth through seventh rib fractures. Electronically Signed   By: Sharlet Salina M.D.   On: 08/06/2023 18:37     Data Reviewed: Relevant notes from primary care and specialist visits, past discharge summaries as available in EHR, including Care Everywhere. Prior diagnostic testing as pertinent to current admission diagnoses Updated medications and problem lists for reconciliation ED course, including vitals, labs, imaging, treatment and response to treatment Triage notes, nursing and pharmacy notes and ED provider's notes Notable results as noted in HPI   Assessment and Plan: Bilateral pneumonia Acute respiratory failure with hypoxia History of multiple rib fractures 07/07/2023 Possible ongoing aspiration(abnormal barium swallow 9/11),  complicated by recent rib fractures O2 sat in the 60s with EMS requiring 6 L to maintain sats in the 90s, now titrated down to 2 L Cefepime, azithromycin and vancomycin if MRSA screen positive Will keep n.p.o. Antitussives, albuterol PR RN Incentive spirometer Supplemental oxygen to keep sats over 94%  SLP consult   Dysphagia Oral candidiasis currently on fluconazole Hold fluconazole pending speech therapy eval S/p modified barium swallow on 9/11 that showed mild to moderate chronic oropharyngeal phase dysphagia and esophageal phase dysphagia with  recommendations for dysphagia 2/3 and thin liquid among others.   SLP eval  Frequent falls Patient has a history of recurrent falls that are often mechanical in nature.  Until her last fall in 07/07/23 lived at home independently, now at [facility] since  07/11/2023 Fall precautions  Acute metabolic encephalopathy Patient was reported to be confused which is not her baseline Neurochecks with fall and aspiration precautions  Chronic kidney disease, stage 3a (HCC) Renal function currently at baseline. Intermittent monitoring  Monoclonal B-cell lymphocytosis of undetermined significance Follows with heme-onc  Paroxysmal atrial fibrillation (HCC) Continue home metoprolol Patient not on AC due to high risk falls    DVT prophylaxis: Lovenox  Consults: none  Advance Care Planning:   Code Status: Do not attempt resuscitation (DNR) PRE-ARREST INTERVENTIONS DESIRED   Family Communication: Family friends at bedside  Disposition Plan: Back to previous home environment  Severity of Illness: The appropriate patient status for this patient is INPATIENT. Inpatient status is judged to be reasonable and necessary in order to provide the required intensity of service to ensure the patient's safety. The patient's presenting symptoms, physical exam findings, and initial radiographic and laboratory data in the context of their chronic comorbidities is felt to place them at high risk for further clinical deterioration. Furthermore, it is not anticipated that the patient will be medically stable for discharge from the hospital within 2 midnights of admission.   * I certify that at the point of admission it is my clinical judgment that the patient will require inpatient hospital care spanning beyond 2 midnights from the point of admission due to high intensity of service, high risk for further deterioration and high frequency of surveillance required.*  Author: Andris Baumann, MD 08/06/2023 11:44  PM  For on call review [email or web address].

## 2023-08-06 NOTE — H&P (Incomplete)
History and Physical    Patient: Olivia Lewis [id number]:[id number] DOB: 03-28-1929 DOA: 08/06/2023 DOS: the patient was seen and examined on 08/06/2023 PCP: Gracelyn Nurse, MD  Patient coming from: Home  Chief Complaint: No chief complaint on file.   HPI: Olivia Lewis is a 89 y.o. female with medical history significant for HFpEF, CKD stage IIIa, hypertension, hyperlipidemia, osteoporosis, hypothyroidism, and frequent falls, recently hospitalized from 8/15 to 8/19 with respiratory failure secondary to multiple rib fractures from a fall who was sent from [facility] for evaluation of shortness of breath, productive cough and confusion that was noticed when her family visited her today.  On arrival of EMS she was reportedly saturating in the 60s on room air and was placed on oxygen, requiring up to 6 L to maintain sats in the 90s. ED course and data review: On arrival afebrile, BP soft at 98/70 with pulse 60, respirations 22 O2 sat 97% on O2 at 6 L.  She was eventually able to be weaned down to 2 L maintaining sats at 100. Lab: WBC 18,000 with lactic acid 1.3, hemoglobin 15, renal function at baseline, COVID-negative. EKG, personally viewed and interpreted showing sinus at 66 with no acute ST-T wave changes CTA chest negative for PE but showing bilateral lower lobe consolidation and stable rib fractures from prior exam as well as chronic thoracic compression deformities and sternal fractures Patient placed on cefepime and azithromycin Hospitalist consulted for admission.     Past Medical History:  Diagnosis Date  . Allergy   . Anemia   . Arthritis   . B12 deficiency   . Basal cell carcinoma    face, R arm, txted by Dr. Orson Aloe and [facility]  . Cancer (HCC)    skin--face and arms  . Cataracts, bilateral   . CHF (congestive heart failure) (HCC)   . Chickenpox   . CKD (chronic kidney disease), stage III (HCC)   . Diverticulosis   . GERD (gastroesophageal reflux disease)   .  Hyperlipidemia   . Hypertension   . Hypothyroidism   . Osteoporosis   . Squamous cell carcinoma of skin 05/01/2021   right distal tricep at elbow, left forearm near elbow, right dorsum hand, left mid lat forearm - all treated with EDC  . Squamous cell carcinoma of skin 05/01/2021   right calf - EDC 07/14/21  . Squamous cell carcinoma of skin 08/18/2021   R sup calf - ED&C  . Squamous cell carcinoma of skin 12/29/2021   L mandible - ED&C  . Squamous cell carcinoma of skin 12/29/2021   L lat elbow - ED&C  . Squamous cell carcinoma of skin 12/29/2021   L lat tricep - ED&C  . Squamous cell carcinoma of skin 05/11/2022   Right deltoid - EDC  . Squamous cell carcinoma of skin 05/11/2022   Left forearm - in situ - EDC   Past Surgical History:  Procedure Laterality Date  . BUNIONECTOMY Bilateral   . CARDIAC CATHETERIZATION    . CATARACT EXTRACTION Bilateral   . DILATION AND CURETTAGE OF UTERUS    . EYE SURGERY Bilateral    cataract  surgery  . JOINT REPLACEMENT Right    total knee replacement  . meniscus tear Right   . multiple skin cancer removal    . NEUROMA SURGERY Bilateral    bilateral feet  . rotator cuff surgery Right   . TOTAL KNEE ARTHROPLASTY Left 05/06/2015   Procedure: TOTAL KNEE ARTHROPLASTY;  Surgeon: Kennedy Bucker,  MD;  Location: [facility] ORS;  Service: Orthopedics;  Laterality: Left;   Social History:  reports that she has never smoked. She has never used smokeless tobacco. She reports that she does not drink alcohol and does not use drugs.  Allergies  Allergen Reactions  . Penicillin V Potassium Other (See Comments)    Has patient had a PCN reaction causing immediate rash, facial/tongue/throat swelling, SOB or lightheadedness with hypotension: Unknown Has patient had a PCN reaction causing severe rash involving mucus membranes or skin necrosis: Unknown Has patient had a PCN reaction that required hospitalization: Unknown Has patient had a PCN reaction occurring within  the last 10 years: No If all of the above answers are "NO", then may proceed with Cephalosporin use.   . Sulfa Antibiotics Other (See Comments)    headache    Family History  Problem Relation Age of Onset  . Hypertension Mother   . Hypertension Father   . Diabetes Sister   . Breast cancer Sister   . Colon cancer Sister   . Bone cancer Brother     Prior to Admission medications   Medication Sig Start Date End Date Taking? Authorizing Provider  acetaminophen (TYLENOL) 500 MG tablet Take 1,000 mg by mouth daily as needed.     [provider]  Calcium Carb-Cholecalciferol 500-200 MG-UNIT TABS Take 1 tablet by mouth daily.     [provider]  Cyanocobalamin (B-12 SL) Place 2,500 mcg under the tongue daily.     [provider]  docusate sodium (COLACE) 100 MG capsule Take 100 mg by mouth daily as needed.    [provider]  fluconazole (DIFLUCAN) 100 MG tablet 2 tablets today then daily for 4 days 07/12/23   Sharon Seller, NP  levothyroxine (SYNTHROID, LEVOTHROID) 50 MCG tablet Take 50 mcg by mouth daily at 6 (six) AM.    [provider]  Lidocaine 4 % GEL Apply to bilateral shoulders topically every 24 hours as needed. On 12 hours and off 12 hours.    [provider]  lisinopril (ZESTRIL) 10 MG tablet Take 1 tablet (10 mg total) by mouth daily. 01/07/23 01/07/24  Georga Hacking, MD  metoprolol tartrate 37.5 MG TABS Take 1 tablet (37.5 mg total) by mouth 2 (two) times daily. 07/11/23   Wouk, Wilfred Curtis, MD  nystatin cream (MYCOSTATIN) Apply 1 Application topically 2 (two) times daily. 05/25/23 05/24/24  [provider]  oxyCODONE (OXY IR/ROXICODONE) 5 MG immediate release tablet Take 0.5 tablets (2.5 mg total) by mouth every 6 (six) hours as needed for moderate pain or severe pain. 07/14/23   Sharon Seller, NP  OXYGEN 2lpm every shift continuous for hypoxia    [provider]  pantoprazole (PROTONIX) 40 MG tablet  Take 1 tablet (40 mg total) by mouth daily. 07/12/23   Sharon Seller, NP  polyethylene glycol (MIRALAX / GLYCOLAX) 17 g packet Take 34 g by mouth daily. 07/12/23   Wouk, Wilfred Curtis, MD  senna (SENOKOT) 8.6 MG TABS tablet Take 1 tablet (8.6 mg total) by mouth daily. 07/12/23   Wouk, Wilfred Curtis, MD  Zinc Oxide (TRIPLE PASTE) 12.8 % ointment Apply 1 Application topically. Every shift.    [provider]    Physical Exam: Vitals:   08/06/23 1950 08/06/23 2115 08/06/23 2130 08/06/23 2312  BP:   138/77   Pulse: 67  64   Resp: (!) 21  17   Temp:      TempSrc:  SpO2: 100% 100% 100% 100%   Physical Exam  Labs on Admission: I have personally reviewed following labs and imaging studies  CBC: Recent Labs  Lab 08/06/23 1808  WBC 18.0*  HGB 15.4*  HCT 46.7*  MCV 90.7  PLT 287   Basic Metabolic Panel: Recent Labs  Lab 08/06/23 1808  NA 137  K 4.6  CL 95*  CO2 30  GLUCOSE 111*  BUN 27*  CREATININE 0.94  CALCIUM 9.0   GFR: Estimated Creatinine Clearance: 30.3 mL/min (by C-G formula based on SCr of 0.94 mg/dL). Liver Function Tests: No results for input(s): "AST", "ALT", "ALKPHOS", "BILITOT", "PROT", "ALBUMIN" in the last 168 hours. No results for input(s): "LIPASE", "AMYLASE" in the last 168 hours. No results for input(s): "AMMONIA" in the last 168 hours. Coagulation Profile: No results for input(s): "INR", "PROTIME" in the last 168 hours. Cardiac Enzymes: No results for input(s): "CKTOTAL", "CKMB", "CKMBINDEX", "TROPONINI" in the last 168 hours. BNP (last 3 results) No results for input(s): "PROBNP" in the last 8760 hours. HbA1C: No results for input(s): "HGBA1C" in the last 72 hours. CBG: No results for input(s): "GLUCAP" in the last 168 hours. Lipid Profile: No results for input(s): "CHOL", "HDL", "LDLCALC", "TRIG", "CHOLHDL", "LDLDIRECT" in the last 72 hours. Thyroid Function Tests: No results for input(s): "TSH", "T4TOTAL", "FREET4", "T3FREE",  "THYROIDAB" in the last 72 hours. Anemia Panel: No results for input(s): "VITAMINB12", "FOLATE", "FERRITIN", "TIBC", "IRON", "RETICCTPCT" in the last 72 hours. Urine analysis:    Component Value Date/Time   COLORURINE YELLOW (A) 01/08/2023 1734   APPEARANCEUR HAZY (A) 01/08/2023 1734   LABSPEC 1.023 01/08/2023 1734   PHURINE 5.0 01/08/2023 1734   GLUCOSEU NEGATIVE 01/08/2023 1734   HGBUR NEGATIVE 01/08/2023 1734   BILIRUBINUR NEGATIVE 01/08/2023 1734   KETONESUR NEGATIVE 01/08/2023 1734   PROTEINUR 30 (A) 01/08/2023 1734   NITRITE POSITIVE (A) 01/08/2023 1734   LEUKOCYTESUR NEGATIVE 01/08/2023 1734    Radiological Exams on Admission: CT Angio Chest PE W and/or Wo Contrast  Result Date: 08/06/2023 CLINICAL DATA:  Hypoxia and shortness of breath EXAM: CT ANGIOGRAPHY CHEST WITH CONTRAST TECHNIQUE: Multidetector CT imaging of the chest was performed using the standard protocol during bolus administration of intravenous contrast. Multiplanar CT image reconstructions and MIPs were obtained to evaluate the vascular anatomy. RADIATION DOSE REDUCTION: This exam was performed according to the departmental dose-optimization program which includes automated exposure control, adjustment of the mA and/or kV according to patient size and/or use of iterative reconstruction technique. CONTRAST:  75mL OMNIPAQUE IOHEXOL 350 MG/ML SOLN COMPARISON:  Chest x-ray from earlier in the same day. CT from 07/09/2023 FINDINGS: Cardiovascular: Atherosclerotic calcifications of the thoracic aorta are noted. The degree of opacification is limited precluding evaluation for dissection. No aneurysmal dilatation is seen. Coronary calcifications are noted. The pulmonary artery shows a normal branching pattern bilaterally. No filling defect to suggest pulmonary embolism is identified. Mediastinum/Nodes: Thoracic inlet is within normal limits. No hilar or mediastinal adenopathy is noted. The esophagus as visualized is within normal  limits. Lungs/Pleura: Mild lower lobe consolidation is noted bilaterally. No bronchial obstructive changes are seen. No focal nodule or sizable pleural effusion is seen. Upper Abdomen: Visualized upper abdomen is within normal limits. Musculoskeletal: Degenerative changes of the thoracic spine are noted. Multiple left-sided rib fractures are again seen stable from the prior study. No underlying pneumothorax is seen. Right-sided rib fractures are noted as well. Increased kyphosis is noted with upper thoracic compression deformity stable from the prior exam. Chronic  manubrial and sternal fractures are seen. Review of the MIP images confirms the above findings. IMPRESSION: No evidence of pulmonary emboli. Bilateral lower lobe consolidation. Multiple rib fractures are noted bilaterally stable from the prior exam. Chronic thoracic compression deformities and sternal fractures are again noted. Aortic Atherosclerosis (ICD10-I70.0). Electronically Signed   By: Alcide Clever M.D.   On: 08/06/2023 23:00   DG Chest 1 View  Result Date: 08/06/2023 CLINICAL DATA:  Short of breath, possible aspiration EXAM: CHEST  1 VIEW COMPARISON:  05/18/2023, 07/09/2023 FINDINGS: 2 frontal views of the chest demonstrates stable enlargement of the cardiac silhouette. No acute airspace disease, effusion, or pneumothorax. Displaced left lateral fourth through seventh rib fractures are noted. Multiple prior healed bilateral rib fractures are again noted. Severe degenerative changes of the right shoulder. IMPRESSION: 1. No acute airspace disease. 2. Acute to subacute displaced left lateral fourth through seventh rib fractures. Electronically Signed   By: Sharlet Salina M.D.   On: 08/06/2023 18:37     Data Reviewed: Relevant notes from primary care and specialist visits, past discharge summaries as available in EHR, including Care Everywhere. Prior diagnostic testing as pertinent to current admission diagnoses Updated medications and  problem lists for reconciliation ED course, including vitals, labs, imaging, treatment and response to treatment Triage notes, nursing and pharmacy notes and ED provider's notes Notable results as noted in HPI   Assessment and Plan: No notes have been filed under this hospital service. Service: Hospitalist       DVT prophylaxis: Lovenox***  Consults: none***  Advance Care Planning:   Code Status: Prior ***  Family Communication: none***  Disposition Plan: Back to previous home environment  Severity of Illness: {Observation/Inpatient:21159}  Author: Andris Baumann, MD 08/06/2023 11:44 PM  For on call review [email or web address].

## 2023-08-07 ENCOUNTER — Other Ambulatory Visit: Payer: Self-pay

## 2023-08-07 DIAGNOSIS — J9601 Acute respiratory failure with hypoxia: Secondary | ICD-10-CM | POA: Diagnosis not present

## 2023-08-07 LAB — CBC WITH DIFFERENTIAL/PLATELET
Abs Immature Granulocytes: 0.16 10*3/uL — ABNORMAL HIGH (ref 0.00–0.07)
Basophils Absolute: 0.1 10*3/uL (ref 0.0–0.1)
Basophils Relative: 0 %
Eosinophils Absolute: 0.1 10*3/uL (ref 0.0–0.5)
Eosinophils Relative: 0 %
HCT: 43.1 % (ref 36.0–46.0)
Hemoglobin: 14 g/dL (ref 12.0–15.0)
Immature Granulocytes: 1 %
Lymphocytes Relative: 9 %
Lymphs Abs: 2.2 10*3/uL (ref 0.7–4.0)
MCH: 29.7 pg (ref 26.0–34.0)
MCHC: 32.5 g/dL (ref 30.0–36.0)
MCV: 91.5 fL (ref 80.0–100.0)
Monocytes Absolute: 1.5 10*3/uL — ABNORMAL HIGH (ref 0.1–1.0)
Monocytes Relative: 6 %
Neutro Abs: 20.1 10*3/uL — ABNORMAL HIGH (ref 1.7–7.7)
Neutrophils Relative %: 84 %
Platelets: 264 10*3/uL (ref 150–400)
RBC: 4.71 MIL/uL (ref 3.87–5.11)
RDW: 14.6 % (ref 11.5–15.5)
WBC: 24.1 10*3/uL — ABNORMAL HIGH (ref 4.0–10.5)
nRBC: 0 % (ref 0.0–0.2)

## 2023-08-07 LAB — BASIC METABOLIC PANEL
Anion gap: 9 (ref 5–15)
BUN: 24 mg/dL — ABNORMAL HIGH (ref 8–23)
CO2: 27 mmol/L (ref 22–32)
Calcium: 7.8 mg/dL — ABNORMAL LOW (ref 8.9–10.3)
Chloride: 103 mmol/L (ref 98–111)
Creatinine, Ser: 0.74 mg/dL (ref 0.44–1.00)
GFR, Estimated: >60 mL/min (ref >60)
Glucose, Bld: 78 mg/dL (ref 70–99)
Potassium: 3.8 mmol/L (ref 3.5–5.1)
Sodium: 139 mmol/L (ref 135–145)

## 2023-08-07 LAB — EXPECTORATED SPUTUM ASSESSMENT W GRAM STAIN, RFLX TO RESP C

## 2023-08-07 LAB — MRSA NEXT GEN BY PCR, NASAL: MRSA by PCR Next Gen: NOT DETECTED

## 2023-08-07 LAB — PROCALCITONIN: Procalcitonin: 0.1 ng/mL

## 2023-08-07 LAB — LACTIC ACID, PLASMA: Lactic Acid, Venous: 1.5 mmol/L (ref 0.5–1.9)

## 2023-08-07 LAB — MAGNESIUM: Magnesium: 1.7 mg/dL (ref 1.7–2.4)

## 2023-08-07 MED ORDER — ENOXAPARIN SODIUM 40 MG/0.4ML IJ SOSY
40.0000 mg | PREFILLED_SYRINGE | INTRAMUSCULAR | Status: DC
  Administered 2023-08-07 – 2023-08-12 (×6): 40 mg via SUBCUTANEOUS
  Filled 2023-08-07 (×6): qty 0.4

## 2023-08-07 MED ORDER — VANCOMYCIN HCL 1250 MG/250ML IV SOLN
1250.0000 mg | Freq: Once | INTRAVENOUS | Status: AC
  Administered 2023-08-07: 1250 mg via INTRAVENOUS
  Filled 2023-08-07: qty 250

## 2023-08-07 MED ORDER — SENNA 8.6 MG PO TABS
1.0000 | ORAL_TABLET | Freq: Every day | ORAL | Status: DC
  Filled 2023-08-07 (×2): qty 1

## 2023-08-07 MED ORDER — OXYCODONE HCL 5 MG PO TABS: 2.5000 mg | ORAL_TABLET | Freq: Four times a day (QID) | ORAL | Status: DC | PRN

## 2023-08-07 MED ORDER — SODIUM CHLORIDE 0.9 % IV SOLN: INTRAVENOUS | Status: AC

## 2023-08-07 MED ORDER — ACETYLCYSTEINE 20 % IN SOLN
4.0000 mL | Freq: Two times a day (BID) | RESPIRATORY_TRACT | Status: DC
  Administered 2023-08-07 – 2023-08-11 (×7): 4 mL via RESPIRATORY_TRACT
  Filled 2023-08-07 (×10): qty 4

## 2023-08-07 MED ORDER — VANCOMYCIN HCL 1250 MG/250ML IV SOLN: 1250.0000 mg | INTRAVENOUS | Status: DC

## 2023-08-07 MED ORDER — LEVOTHYROXINE SODIUM 50 MCG PO TABS
50.0000 ug | ORAL_TABLET | Freq: Every day | ORAL | Status: DC
  Administered 2023-08-08: 50 ug via ORAL
  Filled 2023-08-07 (×2): qty 1

## 2023-08-07 MED ORDER — MAGNESIUM SULFATE 2 GM/50ML IV SOLN
2.0000 g | Freq: Once | INTRAVENOUS | Status: AC
  Administered 2023-08-07: 2 g via INTRAVENOUS
  Filled 2023-08-07: qty 50

## 2023-08-07 MED ORDER — SODIUM CHLORIDE 0.9 % IV SOLN
2.0000 g | INTRAVENOUS | Status: AC
  Administered 2023-08-07 – 2023-08-11 (×5): 2 g via INTRAVENOUS
  Filled 2023-08-07 (×6): qty 20

## 2023-08-07 MED ORDER — IPRATROPIUM-ALBUTEROL 0.5-2.5 (3) MG/3ML IN SOLN
3.0000 mL | Freq: Two times a day (BID) | RESPIRATORY_TRACT | Status: DC
  Administered 2023-08-07 – 2023-08-12 (×9): 3 mL via RESPIRATORY_TRACT
  Filled 2023-08-07 (×11): qty 3

## 2023-08-07 MED ORDER — METOPROLOL TARTRATE 25 MG PO TABS
37.5000 mg | ORAL_TABLET | Freq: Two times a day (BID) | ORAL | Status: DC
  Filled 2023-08-07 (×3): qty 2

## 2023-08-07 MED ORDER — SODIUM CHLORIDE 0.9 % IV SOLN: 500.0000 mg | INTRAVENOUS | Status: DC

## 2023-08-07 MED ORDER — AMIODARONE HCL IN DEXTROSE 360-4.14 MG/200ML-% IV SOLN
30.0000 mg/h | INTRAVENOUS | Status: DC
  Administered 2023-08-07 – 2023-08-08 (×3): 30 mg/h via INTRAVENOUS
  Filled 2023-08-07 (×2): qty 200

## 2023-08-07 MED ORDER — DILTIAZEM HCL 25 MG/5ML IV SOLN
10.0000 mg | Freq: Once | INTRAVENOUS | Status: AC
  Administered 2023-08-07: 10 mg via INTRAVENOUS
  Filled 2023-08-07: qty 5

## 2023-08-07 MED ORDER — LISINOPRIL 10 MG PO TABS
10.0000 mg | ORAL_TABLET | Freq: Every day | ORAL | Status: DC
  Filled 2023-08-07 (×2): qty 1

## 2023-08-07 MED ORDER — ACETAMINOPHEN 500 MG PO TABS: 1000.0000 mg | ORAL_TABLET | Freq: Every day | ORAL | Status: DC | PRN

## 2023-08-07 MED ORDER — SODIUM CHLORIDE 0.9 % IV SOLN: Freq: Once | INTRAVENOUS | Status: AC

## 2023-08-07 MED ORDER — AMIODARONE HCL IN DEXTROSE 360-4.14 MG/200ML-% IV SOLN
60.0000 mg/h | INTRAVENOUS | Status: DC
  Administered 2023-08-07 (×3): 60 mg/h via INTRAVENOUS
  Filled 2023-08-07 (×2): qty 200

## 2023-08-07 MED ORDER — ONDANSETRON HCL 4 MG PO TABS: 4.0000 mg | ORAL_TABLET | Freq: Four times a day (QID) | ORAL | Status: DC | PRN

## 2023-08-07 MED ORDER — ONDANSETRON HCL 4 MG/2ML IJ SOLN: 4.0000 mg | Freq: Four times a day (QID) | INTRAMUSCULAR | Status: DC | PRN

## 2023-08-07 MED ORDER — POLYETHYLENE GLYCOL 3350 17 G PO PACK
34.0000 g | PACK | Freq: Every day | ORAL | Status: DC
  Filled 2023-08-07 (×2): qty 2

## 2023-08-07 MED ORDER — ACETAMINOPHEN 650 MG RE SUPP: 650.0000 mg | Freq: Four times a day (QID) | RECTAL | Status: DC | PRN

## 2023-08-07 MED ORDER — DOXYCYCLINE HYCLATE 100 MG PO TABS
100.0000 mg | ORAL_TABLET | Freq: Two times a day (BID) | ORAL | Status: DC
  Filled 2023-08-07 (×3): qty 1

## 2023-08-07 MED ORDER — ACETAMINOPHEN 325 MG PO TABS
650.0000 mg | ORAL_TABLET | Freq: Four times a day (QID) | ORAL | Status: DC | PRN
  Administered 2023-08-07: 650 mg via ORAL
  Filled 2023-08-07: qty 2

## 2023-08-07 MED ORDER — METOPROLOL TARTRATE 5 MG/5ML IV SOLN
2.5000 mg | INTRAVENOUS | Status: AC | PRN
  Administered 2023-08-07 – 2023-08-10 (×3): 2.5 mg via INTRAVENOUS
  Filled 2023-08-07 (×3): qty 5

## 2023-08-07 MED ORDER — PANTOPRAZOLE SODIUM 40 MG PO TBEC
40.0000 mg | DELAYED_RELEASE_TABLET | Freq: Every day | ORAL | Status: DC
  Filled 2023-08-07 (×2): qty 1

## 2023-08-07 MED ORDER — AMIODARONE LOAD VIA INFUSION
150.0000 mg | Freq: Once | INTRAVENOUS | Status: AC
  Administered 2023-08-07: 150 mg via INTRAVENOUS
  Filled 2023-08-07: qty 83.34

## 2023-08-07 NOTE — Progress Notes (Signed)
Pharmacy Antibiotic Note  Olivia Lewis is a 89 y.o. female admitted on 08/06/2023 with pneumonia.  Pharmacy has been consulted for Vancomycin dosing.  Plan: Vancomycin 1250 mg IV X 1 given in ED on 9/15 @ 0038. Vancomycin 1250 mg IV Q48H ordered to start on 9/17 @ 0100.  AUC = 554.7  Vanc trough = 11.7      Temp (24hrs), Avg:97.8 F (36.6 C), Min:97.8 F (36.6 C), Max:97.8 F (36.6 C)  Recent Labs  Lab 08/06/23 1808 08/06/23 1954  WBC 18.0*  --   CREATININE 0.94  --   LATICACIDVEN  --  1.3    Estimated Creatinine Clearance: 30.3 mL/min (by C-G formula based on SCr of 0.94 mg/dL).    Allergies  Allergen Reactions   Penicillin V Potassium Other (See Comments)    Has patient had a PCN reaction causing immediate rash, facial/tongue/throat swelling, SOB or lightheadedness with hypotension: Unknown Has patient had a PCN reaction causing severe rash involving mucus membranes or skin necrosis: Unknown Has patient had a PCN reaction that required hospitalization: Unknown Has patient had a PCN reaction occurring within the last 10 years: No If all of the above answers are "NO", then may proceed with Cephalosporin use.    Sulfa Antibiotics Other (See Comments)    headache    Antimicrobials this admission:   >>    >>   Dose adjustments this admission:   Microbiology results:  BCx:   UCx:    Sputum:    MRSA PCR:   Thank you for allowing pharmacy to be a part of this patient's care.  Cindra Austad D 08/07/2023 12:53 AM

## 2023-08-07 NOTE — Assessment & Plan Note (Addendum)
Continue home metoprolol Patient not on AC due to high risk falls

## 2023-08-07 NOTE — IPAL (Signed)
  Interdisciplinary Goals of Care Family Meeting   Date carried out: 08/07/2023  Location of the meeting: Bedside  Member's involved:)) From church and patient contacts Leana Gamer and Harrell Lark . No family listed as contact Patient is POA younger sister who is in the memory care unit and is unable to assist with decision-making  Durable Power of Attorney or acting medical decision maker: Acting decision-maker his friend listed at contact who is aware of patient's wishes.  Friend states that there is a nephew, Truitt Merle in the picture (not listed as contact) who was at the facility a couple weeks prior  Discussion: We discussed goals of care for Olivia Lewis .   I have reviewed medical records including EPIC notes, labs and imaging, assessed the patient and then met with patient and friend to discuss major active diagnoses, plan of care, natural trajectory, prognosis, GOC, EOL wishes, disposition and options including Full code/DNI/DNR and the concept of comfort care if DNR is elected. Questions and concerns were addressed.  Patient at this time is unable to make the decision however she does have a DNR paper Friend expresses that she knows that patient will definitely not want comfort care because she is the POA for her younger sister who is in the memory care unit and she is currently in the process of working out plans for provision of her younger sister with the nephew Truitt Merle  Code status:   Code Status: Do not attempt resuscitation (DNR) PRE-ARREST INTERVENTIONS DESIRED   Disposition: Continue current acute care  Time spent for the meeting: 35    Andris Baumann, MD  08/07/2023, 1:42 AM

## 2023-08-07 NOTE — ED Notes (Signed)
Pt cleaned up, new brief applied. Pt bed changed and pt repositioned comfortably. Friend at bedside

## 2023-08-07 NOTE — Assessment & Plan Note (Signed)
Follows with heme-onc

## 2023-08-07 NOTE — Progress Notes (Signed)
PROGRESS NOTE    Olivia Lewis  ZOX:[id number] DOB: 01-Aug-1929 DOA: 08/06/2023 PCP: Gracelyn Nurse, MD  ED10A/ED10A  LOS: 1 day   Brief hospital course:   Assessment & Plan: Olivia Lewis is a 89 y.o. female with medical history significant for HFpEF, CKD stage IIIa, hypertension, hyperlipidemia, osteoporosis, hypothyroidism, and frequent falls, recently hospitalized from 8/15 to 8/19 with respiratory failure secondary to multiple rib fractures from a fall, as well as chronic dysphagia s/p abnormal barium swallow on 9/11 who was sent from [facility] for evaluation of shortness of breath, productive cough and confusion that was noticed when her time church friends visited her today.  History is given by  friends at bedside, [name] and [name] who stated that they saw the patient on the day prior and she had a cough and was mildly confused but on the day of arrival she was much more confused.  They state that at baseline patient is very sharp and with it and manages her own affairs and is the power of attorney of her sister who is on the memory care unit.  On arrival of EMS she was reportedly saturating in the 60s on room air and was placed on oxygen, requiring up to 6 L to maintain sats in the 90s.    Bilateral pneumonia Acute respiratory failure with hypoxia History of multiple rib fractures 07/07/2023 Possible ongoing aspiration (abnormal barium swallow 9/11), complicated by recent rib fractures O2 sat in the 60s with EMS requiring 6 L to maintain sats in the 90s, now titrated down to 2 L Plan: --cont ceftriaxone and doxy --d/c IV vanc since MRSA screen neg --Mucomyst neb BID --IS --Continue supplemental O2 to keep sats >=92%, wean as tolerated  Dysphagia --pt has trouble swallowing solids.  RN reported difficulty even with apple sauce. --NPO for now, pending SLP eval  Afib w RVR History of paroxysmal atrial fibrillation  --hypotension following IV dilt.  Started on IV amio.   Cardio consulted. --on metop PTA, not on anticoagulation due to frequent falls Plan: --cont amio gtt --management per cardio --resume home Lopressor when cleared to take oral  Frequent falls Patient has a history of recurrent falls that are often mechanical in nature.  Until her last fall in 07/07/23 lived at home independently, now at [facility] since 07/11/2023 Fall precautions --PT  Acute metabolic encephalopathy Patient was reported to be confused which is not her baseline.  Likely due to respiratory failure --mental status improved today  Chronic kidney disease, stage 3a (HCC) Renal function currently at baseline. Intermittent monitoring  Monoclonal B-cell lymphocytosis of undetermined significance Follows with heme-onc   DVT prophylaxis: Lovenox SQ Code Status: DNR  Family Communication: friend updated at bedside today Level of care: Progressive Dispo:   The patient is from: SNF rehab Anticipated d/c is to: SNF rehab Anticipated d/c date is: 2-3 days   Subjective and Interval History:  Pt complained of increased phlegm, difficulty swallowing solids, and still having rib pain.   Objective: Vitals:   08/07/23 1145 08/07/23 1200 08/07/23 1215 08/07/23 1230  BP: 110/60 98/60 94/60  102/72  Pulse: (!) 49 (!) 53 (!) 56 (!) 56  Resp: (!) 23 20 19 20   Temp:      TempSrc:      SpO2: 99% 98% 97% 97%    Intake/Output Summary (Last 24 hours) at 08/07/2023 1601 Last data filed at 08/07/2023 1149 Gross per 24 hour  Intake 1307.35 ml  Output --  Net 1307.35  ml   There were no vitals filed for this visit.  Examination:   Constitutional: NAD, AAOx3 HEENT: conjunctivae and lids normal, EOMI CV: No cyanosis.   RESP: normal respiratory effort, phlegmy, on [place] Neuro: II - XII grossly intact.   Psych: Normal mood and affect.  Appropriate judgement and reason   Data Reviewed: I have personally reviewed labs and imaging studies  Time spent: 50 minutes  Darlin Priestly,  MD [facility] If 7PM-7AM, please contact night-coverage 08/07/2023, 4:01 PM

## 2023-08-07 NOTE — ED Notes (Signed)
Pt noted to be in SVT at 165 on monitor.  EDP Isaacs at bedside.

## 2023-08-07 NOTE — ED Notes (Signed)
IV team consult in, reason as to why pt ABX and Magnesium is delayed

## 2023-08-07 NOTE — ED Notes (Signed)
Pt given phone to make phone call to friend, denies other needs at this time

## 2023-08-07 NOTE — ED Notes (Signed)
Pt friends left at this time, state another friend is on the way. IV team consult place pt second IV infiltrated on this RN arrival. Pt denies other needs at this time

## 2023-08-07 NOTE — Assessment & Plan Note (Addendum)
Oral candidiasis currently on fluconazole Hold fluconazole pending speech therapy eval S/p modified barium swallow on 9/11 that showed mild to moderate chronic oropharyngeal phase dysphagia and esophageal phase dysphagia with recommendations for dysphagia 2/3 and thin liquid among others.   SLP eval

## 2023-08-07 NOTE — ED Notes (Signed)
Pt cleaned, brief changed, linen changed.  Pt repositioned and reports she is comfortable.  Pt denies any other needs or wants at this time.

## 2023-08-07 NOTE — ED Notes (Signed)
Pt resting more comfortably after breathing treatment, friend at bedside.

## 2023-08-07 NOTE — Assessment & Plan Note (Signed)
Patient was reported to be confused which is not her baseline Neurochecks with fall and aspiration precautions

## 2023-08-07 NOTE — ED Notes (Signed)
MD Para March in room at this time.

## 2023-08-07 NOTE — Assessment & Plan Note (Addendum)
Renal function currently at baseline. Intermittent monitoring

## 2023-08-07 NOTE — Assessment & Plan Note (Deleted)
-  Continue home regimen - Patient not on AC due to high risk falls

## 2023-08-07 NOTE — Progress Notes (Addendum)
CROSS COVER NOTE  NAME: Olivia Lewis MRN: [id number] DOB : 1929-06-22    Concern as stated by nurse / staff   SV Tach at 177 ED MD Isaacs in here now      Pertinent findings on chart review: Patient admitted a few hours earlier with bilateral pneumonia and respiratory failure    Patient assessment By the time of my arrival, patient in rapid A-fib on the monitor at a rate in the 150s, diltiazem IV 10 mg being pushed at the order of Dr. Erma Heritage in the ED Physical Exam Constitutional:      Comments: Frail-appearing elderly female awake .  2 friends from church at bedside Cardiac monitor with rate in the 150s BP in the 120s over 70s, O2 sat in the high 90s on 2 L  Cardiovascular:     Rate and Rhythm: Tachycardia present. Rhythm irregular.  Pulmonary:     Effort: Tachypnea present.     Comments: Coarse breath sounds, diminished at bases Neurological:     General: No focal deficit present.     Mental Status: She is disoriented.    During my assessment, and following diltiazem infusion, BP drops to the 70s over 40s.  Heart rate improved to 100-110.  Mentation unchanged during this time  1 L NS bolus ordered   Assessment and  Interventions   Assessment:  A-fib with RVR, improved with diltiazem bolus 10 mg Hypotension following IV diltiazem administration  Plan: Continue to monitor heart rate Will complete bolus to keep MAP over 65 .  Admission to PCU from med/tele  Addendum at 5:16 AM Patient continues to be in rapid A-fib rate up to the 140s in spite of diltiazem and subsequently metoprolol x 2 Starting on amiodarone Azithromycin held due to QT prolongation risk Cardiology consulted  Updated patient's friend at bedside on possibility of clinical decline.  No involved family member however friend did give me number of nephew Truitt Merle at [phone], but name is not on contacts and tried: But voicemail. Will try to reach out to facility for info on next of  kin      CRITICAL CARE Performed by: Andris Baumann   Total critical care time: 60 minutes  Critical care time was exclusive of separately billable procedures and treating other patients.  Critical care was necessary to treat or prevent imminent or life-threatening deterioration.  Critical care was time spent personally by me on the following activities: development of treatment plan with patient and/or surrogate as well as nursing, discussions with consultants, evaluation of patient's response to treatment, examination of patient, obtaining history from patient or surrogate, ordering and performing treatments and interventions, ordering and review of laboratory studies, ordering and review of radiographic studies, pulse oximetry and re-evaluation of patient's condition.

## 2023-08-07 NOTE — Progress Notes (Signed)
CROSS COVER NOTE  NAME: TARSHIA KETTLEWELL MRN: [id number] DOB : 11-10-1929    Concern as stated by nurse / staff   SV Tach at 177 ED MD Isaacs in here now      Pertinent findings on chart review: Patient admitted a few hours earlier with bilateral pneumonia and respiratory failure    Patient assessment By the time of my arrival, patient in rapid A-fib on the monitor at a rate in the 150s, diltiazem IV 10 mg being pushed at the order of Dr. Erma Heritage in the ED Physical Exam Constitutional:      Comments: Frail-appearing elderly female awake .  2 friends from church at bedside Cardiac monitor with rate in the 150s BP in the 120s over 70s, O2 sat in the high 90s on 2 L  Cardiovascular:     Rate and Rhythm: Tachycardia present. Rhythm irregular.  Pulmonary:     Effort: Tachypnea present.     Comments: Coarse breath sounds, diminished at bases Neurological:     General: No focal deficit present.     Mental Status: She is disoriented.    During my assessment, and following diltiazem infusion, BP drops to the 70s over 40s.  Heart rate improved to 100-110.  Mentation unchanged during this time  1 L NS bolus ordered   Assessment and  Interventions   Assessment:  A-fib with RVR, improved with diltiazem bolus 10 mg Hypotension following IV diltiazem administration  Plan: Continue to monitor heart rate Will complete bolus to keep MAP over 65 .  Admission to PCU from med/tele      CRITICAL CARE Performed by: Andris Baumann   Total critical care time: 30 minutes  Critical care time was exclusive of separately billable procedures and treating other patients.  Critical care was necessary to treat or prevent imminent or life-threatening deterioration.  Critical care was time spent personally by me on the following activities: development of treatment plan with patient and/or surrogate as well as nursing, discussions with consultants, evaluation of patient's response to  treatment, examination of patient, obtaining history from patient or surrogate, ordering and performing treatments and interventions, ordering and review of laboratory studies, ordering and review of radiographic studies, pulse oximetry and re-evaluation of patient's condition.

## 2023-08-07 NOTE — ED Notes (Signed)
Hospitalist and cardiologist messaged again about pausing or continuing amiodarone drip, no new orders

## 2023-08-07 NOTE — ED Notes (Addendum)
Hospitalist messaged due to pt converting into sinus bradycardia, EKG uploaded. No new orders at this time. RN paused amiodarone upon waiting for orders.

## 2023-08-07 NOTE — Progress Notes (Signed)
[facility] Cardiology  CARDIOLOGY CONSULT NOTE  Patient ID: Olivia Lewis MRN: [id number] DOB/AGE: 87-Dec-1930 87 y.o.  Admit date: 08/06/2023 Referring Physician Para March Primary Physician [facility] Primary Cardiologist [facility] Reason for Consultation atrial fibrillation  HPI: 89 year old female referred for evaluation of atrial fibrillation with rapid ventricular rate.  Patient recently hospitalized 07/07/2023 - 07/11/2023 with respiratory failure and multiple rib fractures after falling.  The patient had abnormal barium swallow on 08/03/2023 and has been residing at [facility].  Friends noted that the patient experienced a productive cough and confusion the patient was brought to [facility] ED where she was noted to be hypoxic.  White count was 18,000.  CT was negative for PE but showed bilateral lower lobe consolidation.  Patient admitted for bilateral pneumonia, possible aspiration.  ECG revealed atrial fibrillation at 133 bpm.  Patient has a history of paroxysmal atrial fibrillation, on metoprolol tartrate for rate and rhythm control.  The patient is not anticoagulated due to frequent falls.  The patient was started on amiodarone drip, and converted to sinus rhythm at 60 bpm.  Review of systems complete and found to be negative unless listed above     Past Medical History:  Diagnosis Date   Allergy    Anemia    Arthritis    B12 deficiency    Basal cell carcinoma    face, R arm, txted by Dr. Orson Aloe and [facility]   Cancer [facility])    skin--face and arms   Cataracts, bilateral    CHF (congestive heart failure) (HCC)    Chickenpox    CKD (chronic kidney disease), stage III (HCC)    Diverticulosis    GERD (gastroesophageal reflux disease)    Hyperlipidemia    Hypertension    Hypothyroidism    Osteoporosis    Squamous cell carcinoma of skin 05/01/2021   right distal tricep at elbow, left forearm near elbow, right dorsum hand, left mid lat forearm - all treated with EDC   Squamous cell carcinoma of  skin 05/01/2021   right calf - EDC 07/14/21   Squamous cell carcinoma of skin 08/18/2021   R sup calf - ED&C   Squamous cell carcinoma of skin 12/29/2021   L mandible - ED&C   Squamous cell carcinoma of skin 12/29/2021   L lat elbow - ED&C   Squamous cell carcinoma of skin 12/29/2021   L lat tricep - ED&C   Squamous cell carcinoma of skin 05/11/2022   Right deltoid - EDC   Squamous cell carcinoma of skin 05/11/2022   Left forearm - in situ - EDC    Past Surgical History:  Procedure Laterality Date   BUNIONECTOMY Bilateral    CARDIAC CATHETERIZATION     CATARACT EXTRACTION Bilateral    DILATION AND CURETTAGE OF UTERUS     EYE SURGERY Bilateral    cataract  surgery   JOINT REPLACEMENT Right    total knee replacement   meniscus tear Right    multiple skin cancer removal     NEUROMA SURGERY Bilateral    bilateral feet   rotator cuff surgery Right    TOTAL KNEE ARTHROPLASTY Left 05/06/2015   Procedure: TOTAL KNEE ARTHROPLASTY;  Surgeon: Kennedy Bucker, MD;  Location: [facility] ORS;  Service: Orthopedics;  Laterality: Left;    (Not in a hospital admission)  Social History   Socioeconomic History   Marital status: Widowed    Spouse name: Not on file   Number of children: Not on file   Years of education:  Not on file   Highest education level: Not on file  Occupational History   Not on file  Tobacco Use   Smoking status: Never   Smokeless tobacco: Never  Vaping Use   Vaping status: Never Used  Substance and Sexual Activity   Alcohol use: No   Drug use: No   Sexual activity: Not Currently  Other Topics Concern   Not on file  Social History Narrative   Not on file   Social Determinants of Health   Financial Resource Strain: Not on file  Food Insecurity: No Food Insecurity (07/07/2023)   Hunger Vital Sign    Worried About Running Out of Food in the Last Year: Never true    Ran Out of Food in the Last Year: Never true  Transportation Needs: No Transportation Needs  (07/07/2023)   PRAPARE - Administrator, Civil Service (Medical): No    Lack of Transportation (Non-Medical): No  Physical Activity: Not on file  Stress: Not on file  Social Connections: Not on file  Intimate Partner Violence: Not At Risk (07/07/2023)   Humiliation, Afraid, Rape, and Kick questionnaire    Fear of Current or Ex-Partner: No    Emotionally Abused: No    Physically Abused: No    Sexually Abused: No    Family History  Problem Relation Age of Onset   Hypertension Mother    Hypertension Father    Diabetes Sister    Breast cancer Sister    Colon cancer Sister    Bone cancer Brother       Review of systems complete and found to be negative unless listed above      PHYSICAL EXAM  General: Well developed, well nourished, in no acute distress HEENT:  Normocephalic and atramatic Neck:  No JVD.  Lungs: Clear bilaterally to auscultation and percussion. Heart: HRRR . Normal S1 and S2 without gallops or murmurs.  Abdomen: Bowel sounds are positive, abdomen soft and non-tender  Msk:  Back normal, normal gait. Normal strength and tone for age. Extremities: No clubbing, cyanosis or edema.   Neuro: Alert and oriented X 3. Psych:  Good affect, responds appropriately  Labs:   Lab Results  Component Value Date   WBC 24.1 (H) 08/07/2023   HGB 14.0 08/07/2023   HCT 43.1 08/07/2023   MCV 91.5 08/07/2023   PLT 264 08/07/2023    Recent Labs  Lab 08/07/23 0452  NA 139  K 3.8  CL 103  CO2 27  BUN 24*  CREATININE 0.74  CALCIUM 7.8*  GLUCOSE 78   Lab Results  Component Value Date   TROPONINI <0.03 02/12/2018    Lab Results  Component Value Date   CHOL 130 04/09/2021   Lab Results  Component Value Date   HDL 49 04/09/2021   Lab Results  Component Value Date   LDLCALC 72 04/09/2021   Lab Results  Component Value Date   TRIG 47 04/09/2021   Lab Results  Component Value Date   CHOLHDL 2.7 04/09/2021   No results found for: "LDLDIRECT"     Radiology: CT Angio Chest PE W and/or Wo Contrast  Result Date: 08/06/2023 CLINICAL DATA:  Hypoxia and shortness of breath EXAM: CT ANGIOGRAPHY CHEST WITH CONTRAST TECHNIQUE: Multidetector CT imaging of the chest was performed using the standard protocol during bolus administration of intravenous contrast. Multiplanar CT image reconstructions and MIPs were obtained to evaluate the vascular anatomy. RADIATION DOSE REDUCTION: This exam was performed according to the  departmental dose-optimization program which includes automated exposure control, adjustment of the mA and/or kV according to patient size and/or use of iterative reconstruction technique. CONTRAST:  75mL OMNIPAQUE IOHEXOL 350 MG/ML SOLN COMPARISON:  Chest x-ray from earlier in the same day. CT from 07/09/2023 FINDINGS: Cardiovascular: Atherosclerotic calcifications of the thoracic aorta are noted. The degree of opacification is limited precluding evaluation for dissection. No aneurysmal dilatation is seen. Coronary calcifications are noted. The pulmonary artery shows a normal branching pattern bilaterally. No filling defect to suggest pulmonary embolism is identified. Mediastinum/Nodes: Thoracic inlet is within normal limits. No hilar or mediastinal adenopathy is noted. The esophagus as visualized is within normal limits. Lungs/Pleura: Mild lower lobe consolidation is noted bilaterally. No bronchial obstructive changes are seen. No focal nodule or sizable pleural effusion is seen. Upper Abdomen: Visualized upper abdomen is within normal limits. Musculoskeletal: Degenerative changes of the thoracic spine are noted. Multiple left-sided rib fractures are again seen stable from the prior study. No underlying pneumothorax is seen. Right-sided rib fractures are noted as well. Increased kyphosis is noted with upper thoracic compression deformity stable from the prior exam. Chronic manubrial and sternal fractures are seen. Review of the MIP images confirms  the above findings. IMPRESSION: No evidence of pulmonary emboli. Bilateral lower lobe consolidation. Multiple rib fractures are noted bilaterally stable from the prior exam. Chronic thoracic compression deformities and sternal fractures are again noted. Aortic Atherosclerosis (ICD10-I70.0). Electronically Signed   By: Alcide Clever M.D.   On: 08/06/2023 23:00   DG Chest 1 View  Result Date: 08/06/2023 CLINICAL DATA:  Short of breath, possible aspiration EXAM: CHEST  1 VIEW COMPARISON:  05/18/2023, 07/09/2023 FINDINGS: 2 frontal views of the chest demonstrates stable enlargement of the cardiac silhouette. No acute airspace disease, effusion, or pneumothorax. Displaced left lateral fourth through seventh rib fractures are noted. Multiple prior healed bilateral rib fractures are again noted. Severe degenerative changes of the right shoulder. IMPRESSION: 1. No acute airspace disease. 2. Acute to subacute displaced left lateral fourth through seventh rib fractures. Electronically Signed   By: Sharlet Salina M.D.   On: 08/06/2023 18:37   DG SWALLOW FUNC OP MEDICARE SPEECH PATH  Result Date: 08/03/2023 Table formatting from the original result was not included. Modified Barium Swallow Study Patient Details Name: TAE PITCAIRN MRN: [id number] Date of Birth: 15-Apr-1929 Today's Date: 08/03/2023 HPI/PMH: HPI: Pt is a 89 y.o. female with medical history significant of Kyphosis ("sometimes wear a neck brace at home"), Gastric REFLUX, HFpEF, CKD stage III, hypertension, hyperlipidemia, osteoporosis, hypothyroidism, and frequent Falls who presents to the ED on 07/11/2023 s/p Fall, rib fxs. Pt requires support w/ all ADLs at [facility]. Pt has Severe Kyphotic positioning impacting head up position for oral intake, sitting in chair. Clinical Impression Patient appears to present w/ mild-moderate chronic oropharyngeal phase dysphagia and Esophageal phase dysphagia. Contributing factors impacting swallowing include severe  Kyphotic posture and structural age-related changes along w/ advance age(89yo). Oral phase is characterized by slow mastication and A-P transfer -- suspect impact of Gravity from head down/forwad position. Slight-min residue appeared to clear w/ f/u, Dry swallow. Swallow initiation appeared consistent at the level of the Valleculae. Pharyngeal phase is noted for functional base of tongue retraction and peristalsis w/ no pharyngeal residue collection post swallow. OF NOTE, suspected partial PE Segment opening/obstruction(?) appeared to contribute to decreased pharyngeal and PE Segment clearance of bolus material w/ all trials resulting in closure preventing full bolus clearance of the area -- a min-mod  amount of the bolus remained and/or returned in the pharynx/Pyriform Sinuses post swallow. No obvious laryngeal penetration nore aspiration occurred d/t the pharyngeal residue, although, pt is at increased risk of such to occur.  Subsequent Dry swallow appeared somewhat effective in reducing/clearing the Pyriform Sinus residue. Many of the other pharyngeal components of swallowing(epiglottic inversion, exursion, BOT retraction) were difficult to assess d/t significantly obscured view d/t the Kyphotic positioning. No obvious aspiration occurred during trials given; possible slight/brief laryngeal penetration during larger sips of thin liquids (via straw) noted but no build-up w/ f/u trial. OF NOTE: pt exhibited Esophageal phase Dysmotility w/ retrograde flow to/through the pyriform sinuses; bolus stasis in mid-Esophagus intermittently.  Due to pt's positioning and discomfort during the exam, further trials were not given and exam ended.  Pt and Family member were given Education post MBSS. Recommended strategies to include: small, single bites/sips, clear oropharyngnx w/ Dry swallow prior to taking another bite/sip, alternate food/liquid. Head forward posture w/ oral intake. Oral care prior to meals to reduce oral  bacterial load. STRICT REFLUX PRECAUTIONS including remaining upright for ~45-60 mins post meals. Recommend to continue to monitor for any negative sequelae from potential aspiration/REFLUX aspiration and consult MD, Palliative Care. Factors that may increase risk of adverse event in presence of aspiration [employer] 2021): Poor general health and/or compromised immunity;Limited mobility;Frail or deconditioned;Dependence for feeding and/or oral hygiene (Kyphotic positioning) Recommendations/Plan: Swallowing Evaluation Recommendations Swallowing Evaluation Recommendations Recommendations: PO diet PO Diet Recommendation: Dysphagia 2 (Finely chopped); Dysphagia 3 (Mechanical soft); Thin liquids (Level 0) (trials of minced-mech soft foods at BEDSIDE w/ Facility SLP monitoring toleration and making further recommendations.) Liquid Administration via: Straw (baseline) Medication Administration: Crushed with puree Supervision: Full assist for feeding; Full supervision/cueing for swallowing strategies Swallowing strategies  : Minimize environmental distractions; Slow rate; Small bites/sips; Multiple dry swallows after each bite/sip; Follow solids with liquids; Clear throat intermittently Postural changes: Position pt fully upright for meals; Stay upright 30-60 min after meals; Out of bed for meals (REFLUX precs.) Oral care recommendations: Oral care BID (2x/day); Oral care before PO; Staff/trained caregiver to provide oral care Recommended consults: Consider GI consultation; Consider esophageal assessment; Consider dietitian consultation; Consider Palliative care (for further Education and Information) Treatment Plan Treatment Plan Treatment recommendations: Defer treatment plan to SLP at other venue (see follow-up recommendations) (continue f/u at SNF) Follow-up recommendations: Skilled nursing-short term rehab (<3 hours/day) Functional status assessment: Patient has had a recent decline in their functional status  and/or demonstrates limited ability to make significant improvements in function in a reasonable and predictable amount of time. Treatment frequency: -- (TBD) Treatment duration: -- (TBD) Interventions: Patient/family education; Compensatory techniques; Diet toleration management by SLP; Trials of upgraded texture/liquids (under Supervision of Rehab SLP at SNF) Recommendations Recommendations for follow up therapy are one component of a multi-disciplinary discharge planning process, led by the attending physician.  Recommendations may be updated based on patient status, additional functional criteria and insurance authorization. Assessment: Orofacial Exam: Orofacial Exam Oral Cavity: Oral Hygiene: WFL Oral Cavity - Dentition: Adequate natural dentition Orofacial Anatomy: WFL Oral Motor/Sensory Function: WFL Anatomy: Anatomy: Prominent cricopharyngeus (strongly suspected.  Posture: severe Kyphotic appearance) Boluses Administered: Boluses Administered Boluses Administered: Thin liquids (Level 0); Moderately thick liquids (Level 3, honey thick); Puree; Solid  Oral Impairment Domain: Oral Impairment Domain Lip Closure: No labial escape Tongue control during bolus hold: Cohesive bolus between tongue to palatal seal Bolus preparation/mastication: Slow prolonged chewing/mashing with complete recollection (more anterior chewing d/t Kyphotic positioning  and head down/forward(gravity)) Bolus transport/lingual motion: -- (Functional) Oral residue: Complete oral clearance (functional in setting of head down/forward) Location of oral residue : N/A Initiation of pharyngeal swallow : Valleculae (appeared)  Pharyngeal Impairment Domain: Pharyngeal Impairment Domain Soft palate elevation: No bolus between soft palate (SP)/pharyngeal wall (PW) Laryngeal elevation: -- (could not assess fully) Anterior hyoid excursion: -- (could not assess fully) Epiglottic movement: Partial inversion (-Complete inversion) Pharyngeal contraction (A/P  view only): N/A Pharyngoesophageal segment opening: Partial distention/partial duration, partial obstruction of flow Tongue base retraction: -- (could not assess fully) Pharyngeal residue: Collection of residue within or on pharyngeal structures Location of pharyngeal residue: Pyriform sinuses  Esophageal Impairment Domain: Esophageal Impairment Domain Esophageal clearance upright position: Esophageal retention with retrograde flow through the PES Pill: Pill Consistency administered: -- (n/a) Penetration/Aspiration Scale Score: Penetration/Aspiration Scale Score 1.  Material does not enter airway: Moderately thick liquids (Level 3, honey thick); Puree; Solid 2.  Material enters airway, remains ABOVE vocal cords then ejected out: Thin liquids (Level 0) (No buildup observed -- not fully certain there was laryngeal penetration?) Compensatory Strategies: Compensatory Strategies Compensatory strategies: Yes Multiple swallows: Effective   General Information: Caregiver present: Yes  Diet Prior to this Study: Dysphagia 1 (pureed); Thin liquids (Level 0) (when admitted to the hospital on 07/11/23 w/ trials to upgrade when she returned back to her Faciility and felt medically improved, stronger.)   No data recorded  Respiratory Status: WFL   Supplemental O2: Nasal cannula (2L)   History of Recent Intubation: No  Behavior/Cognition: Alert; Cooperative; Pleasant mood; Distractible; Requires cueing Self-Feeding Abilities: Dependent for feeding Baseline vocal quality/speech: Normal (Gravely) Volitional Cough: Able to elicit No data recorded Exam Limitations: Poor participation; Poor positioning; Fatigue Goal Planning: Prognosis for improved oropharyngeal function: Guarded (-Fair) Barriers to Reach Goals: Time post onset; Severity of deficits (Kyphotic positioning) Barriers/Prognosis Comment: Kyphotic positioning; advanced age Patient/Family Stated Goal: gave understanding of information provided Consulted and agree with results  and recommendations: Chief of Staff; Patient Pain: Pain Assessment Pain Assessment: 0-10 Pain Score: 4 Pain Location: "all over" moving to transfer to swallow chair Pain Descriptors / Indicators: Discomfort; Grimacing; Guarding; Moaning Pain Intervention(s): Monitored during session; Repositioned; Relaxation End of Session: Start Time:SLP Start Time (ACUTE ONLY): 1245 Stop Time: SLP Stop Time (ACUTE ONLY): 1415 Time Calculation:SLP Time Calculation (min) (ACUTE ONLY): 90 min Charges: SLP Evaluations $ SLP Speech Visit: 1 Visit SLP Evaluations $MBS Swallow: 1 Procedure SLP visit diagnosis: SLP Visit Diagnosis: Dysphagia, pharyngoesophageal phase (R13.14) (Kyphotic positioning) Past Medical History: Past Medical History: Diagnosis Date  Allergy   Anemia   Arthritis   B12 deficiency   Basal cell carcinoma   face, R arm, txted by Dr. Orson Aloe and [facility]  Cancer [facility])   skin--face and arms  Cataracts, bilateral   CHF (congestive heart failure) (HCC)   Chickenpox   CKD (chronic kidney disease), stage III (HCC)   Diverticulosis   GERD (gastroesophageal reflux disease)   Hyperlipidemia   Hypertension   Hypothyroidism   Osteoporosis   Squamous cell carcinoma of skin 05/01/2021  right distal tricep at elbow, left forearm near elbow, right dorsum hand, left mid lat forearm - all treated with EDC  Squamous cell carcinoma of skin 05/01/2021  right calf - EDC 07/14/21  Squamous cell carcinoma of skin 08/18/2021  R sup calf - ED&C  Squamous cell carcinoma of skin 12/29/2021  L mandible - ED&C  Squamous cell carcinoma of skin 12/29/2021  L lat elbow - ED&C  Squamous  cell carcinoma of skin 12/29/2021  L lat tricep - ED&C  Squamous cell carcinoma of skin 05/11/2022  Right deltoid - EDC  Squamous cell carcinoma of skin 05/11/2022  Left forearm - in situ - EDC Past Surgical History: Past Surgical History: Procedure Laterality Date  BUNIONECTOMY Bilateral   CARDIAC CATHETERIZATION    CATARACT EXTRACTION Bilateral   DILATION AND  CURETTAGE OF UTERUS    EYE SURGERY Bilateral   cataract  surgery  JOINT REPLACEMENT Right   total knee replacement  meniscus tear Right   multiple skin cancer removal    NEUROMA SURGERY Bilateral   bilateral feet  rotator cuff surgery Right   TOTAL KNEE ARTHROPLASTY Left 05/06/2015  Procedure: TOTAL KNEE ARTHROPLASTY;  Surgeon: Kennedy Bucker, MD;  Location: [facility] ORS;  Service: Orthopedics;  Laterality: Left; Jerilynn Som, MS, CCC-SLP Speech Language Pathologist Rehab Services; [facility] [phone] (ascom) Watson,Katherine 08/03/2023, 6:48 PM CLINICAL DATA:  Oropharyngeal dysphagia EXAM: MODIFIED BARIUM SWALLOW TECHNIQUE: Different consistencies of barium were administered orally to the patient by the Speech Pathologist. Imaging of the pharynx was performed in the lateral projection. Mina Marble, PA-C was present in the fluoroscopy room during this study, which was supervised and interpreted by Dr. Elige Ko. FLUOROSCOPY: Radiation Exposure Index (as provided by the fluoroscopic device): 62.80 mGy Kerma COMPARISON:  None Available. FINDINGS: Vestibular  Penetration:  None visualized. Aspiration:  None visualized. Other:  Severe kyphosis of cervical spine obstructing visualization IMPRESSION: Severe kyphosis cervical spine limited evaluation today. No vestibular penetration or aspiration visualized on today's exam. Please refer to the Speech Pathologists report for complete details and recommendations. Electronically Signed   By: Elige Ko M.D.   On: 08/03/2023 15:12  CT Angio Chest Pulmonary Embolism (PE) W or WO Contrast  Result Date: 07/09/2023 CLINICAL DATA:  Positive D-dimer. EXAM: CT ANGIOGRAPHY CHEST WITH CONTRAST TECHNIQUE: Multidetector CT imaging of the chest was performed using the standard protocol during bolus administration of intravenous contrast. Multiplanar CT image reconstructions and MIPs were obtained to evaluate the vascular anatomy. RADIATION DOSE REDUCTION: This exam was  performed according to the departmental dose-optimization program which includes automated exposure control, adjustment of the mA and/or kV according to patient size and/or use of iterative reconstruction technique. CONTRAST:  75mL OMNIPAQUE IOHEXOL 350 MG/ML SOLN COMPARISON:  CT chest without contrast 07/07/2023 FINDINGS: Cardiovascular: Satisfactory opacification of the pulmonary arteries to the segmental level. No evidence of pulmonary embolism. Heart is moderately enlarged. No pericardial effusion. There are atherosclerotic calcifications of the aorta and coronary arteries. Mediastinum/Nodes: No enlarged mediastinal, hilar, or axillary lymph nodes. Thyroid gland, trachea, and esophagus demonstrate no significant findings. Lungs/Pleura: There is scarring in both lung apices. There small bilateral pleural effusions similar to prior examination. There is a small amount of atelectasis/airspace disease in the bilateral lower lobes, new/increasing from prior. Trachea and central airways are patent. There is no pneumothorax. Upper Abdomen: No acute abnormality. Musculoskeletal: Acute second, third, fourth, fifth and sixth rib fractures appear similar to the prior examination. Multiple chronic healed rib fractures are again noted bilaterally. There is accentuated kyphosis of the thoracic spine similar to prior. Chronic sternal and manubrial fractures appear unchanged. Chronic fractures of T1, T2 and T3 are unchanged. Review of the MIP images confirms the above findings. IMPRESSION: 1. No evidence for pulmonary embolism. 2. Stable small bilateral pleural effusions. 3. New/increasing bilateral lower lobe atelectasis/airspace disease. 4. Stable cardiomegaly. 5. Aortic atherosclerosis. Aortic Atherosclerosis (ICD10-I70.0). Electronically Signed   By: Darliss Cheney  M.D.   On: 07/09/2023 21:11    EKG: Atrial fibrillation at 133 bpm  ASSESSMENT AND PLAN:   1.  Atrial fibrillation with rapid ventricular rate, converted to  sinus rhythm on amiodarone infusion, with history of paroxysmal atrial fibrillation, on metoprolol to tartrate for rate and rhythm control, not anticoagulated due to history of frequent falls 2.  Bilateral pneumonia, possible aspiration in nature, on ceftriaxone and doxycycline 3.  Frequent falls, multiple rib fractures 4.  CKD stage IIIa  Recommendations  1.  Agree with overall current therapy 2.  Defer chronic anticoagulation with history of multiple falls, recent rib fractures 3.  Continue amiodarone infusion for now 4.  Continue metoprolol to tartrate 37.5 mg p.o. twice daily for rate control 5.  Further recommendations pending patient's initial clinical course  Signed: Marcina Millard MD,PhD, [facility] 08/07/2023, 11:04 AM

## 2023-08-08 ENCOUNTER — Encounter: Payer: Self-pay | Admitting: Internal Medicine

## 2023-08-08 DIAGNOSIS — J189 Pneumonia, unspecified organism: Secondary | ICD-10-CM

## 2023-08-08 DIAGNOSIS — R1310 Dysphagia, unspecified: Secondary | ICD-10-CM

## 2023-08-08 DIAGNOSIS — Z515 Encounter for palliative care: Secondary | ICD-10-CM

## 2023-08-08 DIAGNOSIS — Z66 Do not resuscitate: Secondary | ICD-10-CM | POA: Diagnosis not present

## 2023-08-08 DIAGNOSIS — J9601 Acute respiratory failure with hypoxia: Secondary | ICD-10-CM | POA: Diagnosis not present

## 2023-08-08 DIAGNOSIS — Z7189 Other specified counseling: Secondary | ICD-10-CM

## 2023-08-08 LAB — CBC
HCT: 43.7 % (ref 36.0–46.0)
Hemoglobin: 14.6 g/dL (ref 12.0–15.0)
MCH: 29.9 pg (ref 26.0–34.0)
MCHC: 33.4 g/dL (ref 30.0–36.0)
MCV: 89.4 fL (ref 80.0–100.0)
Platelets: 268 10*3/uL (ref 150–400)
RBC: 4.89 MIL/uL (ref 3.87–5.11)
RDW: 14.8 % (ref 11.5–15.5)
WBC: 12.4 10*3/uL — ABNORMAL HIGH (ref 4.0–10.5)
nRBC: 0 % (ref 0.0–0.2)

## 2023-08-08 LAB — BASIC METABOLIC PANEL
Anion gap: 13 (ref 5–15)
BUN: 23 mg/dL (ref 8–23)
CO2: 27 mmol/L (ref 22–32)
Calcium: 9 mg/dL (ref 8.9–10.3)
Chloride: 97 mmol/L — ABNORMAL LOW (ref 98–111)
Creatinine, Ser: 0.81 mg/dL (ref 0.44–1.00)
GFR, Estimated: >60 mL/min (ref >60)
Glucose, Bld: 83 mg/dL (ref 70–99)
Potassium: 3.7 mmol/L (ref 3.5–5.1)
Sodium: 137 mmol/L (ref 135–145)

## 2023-08-08 LAB — MAGNESIUM: Magnesium: 2.2 mg/dL (ref 1.7–2.4)

## 2023-08-08 MED ORDER — SALINE SPRAY 0.65 % NA SOLN: 1.0000 | NASAL | Status: DC | PRN

## 2023-08-08 MED ORDER — GUAIFENESIN 100 MG/5ML PO LIQD
5.0000 mL | Freq: Three times a day (TID) | ORAL | Status: DC
  Filled 2023-08-08 (×2): qty 10

## 2023-08-08 MED ORDER — AMIODARONE HCL 200 MG PO TABS
100.0000 mg | ORAL_TABLET | Freq: Two times a day (BID) | ORAL | Status: DC
  Filled 2023-08-08 (×2): qty 1

## 2023-08-08 NOTE — [place] FL2 (Signed)
[place] MEDICAID FL2 LEVEL OF CARE FORM     IDENTIFICATION  Patient Name: Olivia Lewis Birthdate: 1928/11/29 Sex: female Admission Date (Current Location): 08/06/2023  [facility] and [place] Number:  Chiropodist and Address:  [facility], [address], [place], [place] [address]      Provider Number: [id number]  Attending Physician Name and Address:  Darlin Priestly, MD  Relative Name and Phone Number:       Current Level of Care: Hospital Recommended Level of Care: Skilled Nursing Facility Prior Approval Number:    Date Approved/Denied:   PASRR Number: [id number]  Discharge Plan: SNF    Current Diagnoses: Patient Active Problem List   Diagnosis Date Noted   Acute respiratory failure with hypoxia (HCC) 08/06/2023   History of multiple rib fractures 07/04/23 08/06/2023   Bilateral pneumonia 08/06/2023   Frequent falls 08/06/2023   Acute metabolic encephalopathy 08/06/2023   Drug-induced constipation 07/13/2023   Oral candidiasis 07/13/2023   Malnutrition of moderate degree 07/09/2023   (HFpEF) heart failure with preserved ejection fraction (HCC) 07/08/2023   Pulmonary hypertension (HCC) 07/08/2023   Acute hypoxic respiratory failure (HCC) 07/07/2023   Multiple rib fractures 07/07/2023   Compression fracture of T3 vertebra (HCC) 07/07/2023   Dysphagia 07/07/2023   Pain due to onychomycosis of toenails of both feet 12/10/2021   Osteoarthritis (arthritis due to wear and tear of joints) 08/13/2021   Hyperlipidemia 08/13/2021   Elevated troponin 04/08/2021   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   Paroxysmal atrial fibrillation (HCC) 10/21/2020   Fall    Leukocytosis    Acute kidney injury superimposed on CKD (HCC)    Atrial fibrillation (HCC) 10/11/2020   Atrial fibrillation with rapid ventricular response (HCC) 10/10/2020   B12 deficiency 04/07/2016   Acid reflux 04/07/2016   HLD  (hyperlipidemia) 04/07/2016   Essential hypertension 04/07/2016   Arthritis, degenerative 04/07/2016   OP (osteoporosis) 04/07/2016   Primary osteoarthritis of knee 05/06/2015   Hypothyroidism 02/25/2015   CKD (chronic kidney disease), stage IIIb 02/25/2015   Calcium blood increased 02/25/2015   Hypercalcemia 02/25/2015   Chronic kidney disease, stage 3a (HCC) 02/25/2015   Baker's cyst of knee 05/30/2014    Orientation RESPIRATION BLADDER Height & Weight     Self, Time, Situation, Place  O2 (Nasal Cannula 2 L) Continent Weight:   Height:  5' (152.4 cm)  BEHAVIORAL SYMPTOMS/MOOD NEUROLOGICAL BOWEL NUTRITION STATUS   (None)  (None) Continent Diet (DYS 1. Extra Gravy on meats, potatoes. Yogurts, puddings, and may have Oatmeal per speech ok w/ sugar.)  AMBULATORY STATUS COMMUNICATION OF NEEDS Skin   Extensive Assist Verbally Normal                       Personal Care Assistance Level of Assistance              Functional Limitations Info  Sight, Speech, Hearing Sight Info: Adequate Hearing Info: Adequate Speech Info: Adequate    SPECIAL CARE FACTORS FREQUENCY  PT (By licensed PT)     PT Frequency: 5 x week              Contractures Contractures Info: Not present    Additional Factors Info  Code Status, Allergies Code Status Info: DNR Allergies Info: Penicillin V Potassium, Sulfa Antibiotics           Current Medications (08/08/2023):  This is the current hospital active medication list Current Facility-Administered Medications  Medication Dose Route Frequency Provider Last Rate Last Admin   acetaminophen (TYLENOL) tablet 650 mg  650 mg Oral Q6H PRN Andris Baumann, MD   650 mg at 08/07/23 0133   Or   acetaminophen (TYLENOL) suppository 650 mg  650 mg Rectal Q6H PRN Andris Baumann, MD       acetylcysteine (MUCOMYST) 20 % nebulizer / oral solution 4 mL  4 mL Nebulization BID Darlin Priestly, MD   4 mL at 08/07/23 1922   amiodarone (PACERONE) tablet 100 mg   100 mg Oral BID Hudson, Caralyn, PA-C       cefTRIAXone (ROCEPHIN) 2 g in sodium chloride 0.9 % 100 mL IVPB  2 g Intravenous Q24H Andris Baumann, MD   Stopped at 08/08/23 1031   doxycycline (VIBRA-TABS) tablet 100 mg  100 mg Oral Q12H Andris Baumann, MD       enoxaparin (LOVENOX) injection 40 mg  40 mg Subcutaneous Q24H Lindajo Royal V, MD   40 mg at 08/08/23 0930   ipratropium-albuterol (DUONEB) 0.5-2.5 (3) MG/3ML nebulizer solution 3 mL  3 mL Nebulization BID Darlin Priestly, MD   3 mL at 08/07/23 1922   levothyroxine (SYNTHROID) tablet 50 mcg  50 mcg Oral Q0600 Andris Baumann, MD   50 mcg at 08/08/23 0508   lisinopril (ZESTRIL) tablet 10 mg  10 mg Oral Daily Andris Baumann, MD       metoprolol tartrate (LOPRESSOR) injection 2.5 mg  2.5 mg Intravenous Q1H PRN Andris Baumann, MD   2.5 mg at 08/07/23 0348   metoprolol tartrate (LOPRESSOR) tablet 37.5 mg  37.5 mg Oral BID Andris Baumann, MD       ondansetron [facility]) tablet 4 mg  4 mg Oral Q6H PRN Andris Baumann, MD       Or   ondansetron [facility]) injection 4 mg  4 mg Intravenous Q6H PRN Andris Baumann, MD       oxyCODONE (Oxy IR/ROXICODONE) immediate release tablet 2.5 mg  2.5 mg Oral Q6H PRN Andris Baumann, MD       pantoprazole (PROTONIX) EC tablet 40 mg  40 mg Oral Daily Andris Baumann, MD       polyethylene glycol (MIRALAX / GLYCOLAX) packet 34 g  34 g Oral Daily Andris Baumann, MD       senna Mancel Parsons) tablet 8.6 mg  1 tablet Oral Daily Andris Baumann, MD       Current Outpatient Medications  Medication Sig Dispense Refill   levothyroxine (SYNTHROID, LEVOTHROID) 50 MCG tablet Take 50 mcg by mouth daily at 6 (six) AM.     magic mouthwash (nystatin, lidocaine, diphenhydrAMINE, alum & mag hydroxide) suspension Swish and spit 15 mLs 4 (four) times daily as needed for mouth pain.     metoprolol tartrate 37.5 MG TABS Take 1 tablet (37.5 mg total) by mouth 2 (two) times daily.     nystatin cream (MYCOSTATIN) Apply 1 Application topically 2  (two) times daily.     oxyCODONE (OXY IR/ROXICODONE) 5 MG immediate release tablet Take 0.5 tablets (2.5 mg total) by mouth every 6 (six) hours as needed for moderate pain or severe pain. 30 tablet 0   Zinc Oxide (TRIPLE PASTE) 12.8 % ointment Apply 1 Application topically. Every shift.     acetaminophen (TYLENOL) 500 MG tablet Take 1,000 mg by mouth daily as needed.      Calcium Carb-Cholecalciferol 500-200 MG-UNIT TABS Take 1 tablet by mouth daily.  Cyanocobalamin (B-12 SL) Place 2,500 mcg under the tongue daily.      docusate sodium (COLACE) 100 MG capsule Take 100 mg by mouth daily as needed.     fluconazole (DIFLUCAN) 100 MG tablet 2 tablets today then daily for 4 days     Lidocaine 4 % GEL Apply to bilateral shoulders topically every 24 hours as needed. On 12 hours and off 12 hours.     lisinopril (ZESTRIL) 10 MG tablet Take 1 tablet (10 mg total) by mouth daily. 30 tablet 11   OXYGEN 2lpm every shift continuous for hypoxia     pantoprazole (PROTONIX) 40 MG tablet Take 1 tablet (40 mg total) by mouth daily.     polyethylene glycol (MIRALAX / GLYCOLAX) 17 g packet Take 34 g by mouth daily.     senna (SENOKOT) 8.6 MG TABS tablet Take 1 tablet (8.6 mg total) by mouth daily.       Discharge Medications: Please see discharge summary for a list of discharge medications.  Relevant Imaging Results:  Relevant Lab Results:   Additional Information SS#: [id number]  Margarito Liner, LCSW

## 2023-08-08 NOTE — Progress Notes (Signed)
PROGRESS NOTE    Olivia Lewis  WUJ:[id number] DOB: 1929/05/05 DOA: 08/06/2023 PCP: Gracelyn Nurse, MD  [id number]  LOS: 2 days   Brief hospital course:   Assessment & Plan: Olivia Lewis is a 89 y.o. female with medical history significant for HFpEF, CKD stage IIIa, hypertension, hyperlipidemia, osteoporosis, hypothyroidism, and frequent falls, recently hospitalized from 8/15 to 8/19 with respiratory failure secondary to multiple rib fractures from a fall, as well as chronic dysphagia s/p abnormal barium swallow on 9/11 who was sent from [facility] for evaluation of shortness of breath, productive cough and confusion that was noticed when her time church friends visited her today.  History is given by  friends at bedside, [name] and [name] who stated that they saw the patient on the day prior and she had a cough and was mildly confused but on the day of arrival she was much more confused.  They state that at baseline patient is very sharp and with it and manages her own affairs and is the power of attorney of her sister who is on the memory care unit.  On arrival of EMS she was reportedly saturating in the 60s on room air and was placed on oxygen, requiring up to 6 L to maintain sats in the 90s.    Bilateral pneumonia Acute respiratory failure with hypoxia History of multiple rib fractures 07/07/2023 Possible ongoing aspiration (abnormal barium swallow 9/11), complicated by recent rib fractures O2 sat in the 60s with EMS requiring 6 L to maintain sats in the 90s, now titrated down to 2 L Plan: --cont ceftriaxone and doxy --cont Mucomyst neb BID --IS --Continue supplemental O2 to keep sats >=92%, wean as tolerated  Dysphagia --pt has trouble swallowing solids.  RN reported difficulty even with apple sauce. --SLP eval, palliative care consulted, pt would not want feeding tube --Dys 1 with nectar thick  Afib w RVR History of paroxysmal atrial fibrillation  --hypotension following  IV dilt.  Started on IV amio.  Cardio consulted. --on metop PTA, not on anticoagulation due to frequent falls Plan: --transition to oral amio --management per cardio --cont home Lopressor  Frequent falls Patient has a history of recurrent falls that are often mechanical in nature.  Until her last fall in 07/07/23 lived at home independently, now at [facility] since 07/11/2023 Fall precautions --PT  Acute metabolic encephalopathy Patient was reported to be confused which is not her baseline.  Likely due to respiratory failure --mental status improved and back to baseline next day  Chronic kidney disease, stage 3a (HCC) Renal function currently at baseline. Intermittent monitoring  Monoclonal B-cell lymphocytosis of undetermined significance Follows with heme-onc   DVT prophylaxis: Lovenox SQ Code Status: DNR  Family Communication: friend updated at bedside today Level of care: Progressive Dispo:   The patient is from: SNF rehab Anticipated d/c is to: SNF rehab Anticipated d/c date is: 2-3 days   Subjective and Interval History:  Pt reported coughing up more sputum after mucomyst nebs.     Objective: Vitals:   08/08/23 1129 08/08/23 1327 08/08/23 1451 08/08/23 1519  BP:  113/64 123/76 (!) 124/54  Pulse:  62 93 65  Resp:  18 (!) 22 20  Temp: 97.7 F (36.5 C) 97.7 F (36.5 C) (!) 97.5 F (36.4 C) (!) 97.5 F (36.4 C)  TempSrc: Oral Oral Oral   SpO2:  95% 90% 100%  Height:        Intake/Output Summary (Last 24 hours) at 08/08/2023 9562  Last data filed at 08/08/2023 1129 Gross per 24 hour  Intake 489.6 ml  Output --  Net 489.6 ml   There were no vitals filed for this visit.  Examination:   Constitutional: NAD, AAOx3, soft cervical collar HEENT: conjunctivae and lids normal, EOMI CV: No cyanosis.   RESP: normal respiratory effort, gurgling breath sounds, on 3L SKIN: warm, dry, extensive bruising   Psych: Normal mood and affect.  Appropriate judgement and  reason   Data Reviewed: I have personally reviewed labs and imaging studies  Time spent: 45 minutes  Darlin Priestly, MD [facility] If 7PM-7AM, please contact night-coverage 08/08/2023, 6:55 PM

## 2023-08-08 NOTE — TOC Initial Note (Signed)
Transition of Care [facility]) - Initial/Assessment Note    Patient Details  Name: Olivia Lewis MRN: [id number] Date of Birth: [birth year]  [facility]) CM/SW Contact:    Margarito Liner, LCSW Phone Number: 08/08/2023, 1:00 PM  Clinical Narrative:   CSW met with patient. Friend at bedside. CSW introduced role and explained that PT recommendations would be discussed. Patient was admitted from [facility] where she was receiving rehab. She confirmed plan to return for more rehab at discharge. Admissions coordinator is aware. No further concerns. CSW encouraged patient to contact CSW as needed. CSW will continue to follow patient for support and facilitate return to SNF at discharge.               Patient Goals and CMS Choice     Choice offered to / list presented to : Patient      Expected Discharge Plan and Services     Post Acute Care Choice: Resumption of Svcs/PTA Provider Living arrangements for the past 2 months: Single Family Home                                      Prior Living Arrangements/Services Living arrangements for the past 2 months: Single Family Home   Patient language and need for interpreter reviewed:: Yes Do you feel safe going back to the place where you live?: Yes      Need for Family Participation in Patient Care: Yes (Comment) Care giver support system in place?: Yes (comment)   Criminal Activity/Legal Involvement Pertinent to Current Situation/Hospitalization: No - Comment as needed  Activities of Daily Living Home Assistive Devices/Equipment: Environmental consultant (specify type), Eyeglasses, Wheelchair ADL Screening (condition at time of admission) Patient's cognitive ability adequate to safely complete daily activities?: No Is the patient deaf or have difficulty hearing?: No Does the patient have difficulty seeing, even when wearing glasses/contacts?: No Does the patient have difficulty concentrating, remembering, or making decisions?:  Yes Patient able to express need for assistance with ADLs?: Yes Does the patient have difficulty dressing or bathing?: Yes Independently performs ADLs?: No Communication: Independent Dressing (OT): Needs assistance Is this a change from baseline?: Pre-admission baseline Grooming: Independent Feeding: Independent Bathing: Needs assistance Is this a change from baseline?: Pre-admission baseline Toileting: Needs assistance Is this a change from baseline?: Pre-admission baseline In/Out Bed: Needs assistance Is this a change from baseline?: Pre-admission baseline Walks in Home: Needs assistance Is this a change from baseline?: Pre-admission baseline Does the patient have difficulty walking or climbing stairs?: Yes Weakness of Legs: Both Weakness of Arms/Hands: None  Permission Sought/Granted Permission sought to share information with : Facility Industrial/product designer granted to share information with : Yes, Verbal Permission Granted     Permission granted to share info w AGENCY: [facility] SNF        Emotional Assessment Appearance:: Appears stated age Attitude/Demeanor/Rapport: Engaged, Gracious Affect (typically observed): Accepting, Appropriate, Calm, Pleasant Orientation: : Oriented to Self, Oriented to Place, Oriented to  Time, Oriented to Situation Alcohol / Substance Use: Not Applicable Psych Involvement: No (comment)  Admission diagnosis:  Acute respiratory failure with hypoxia (HCC) [J96.01] Patient Active Problem List   Diagnosis Date Noted   Acute respiratory failure with hypoxia (HCC) 08/06/2023   History of multiple rib fractures 07/04/23 08/06/2023   Bilateral pneumonia 08/06/2023   Frequent falls 08/06/2023   Acute metabolic encephalopathy 08/06/2023  Drug-induced constipation 07/13/2023   Oral candidiasis 07/13/2023   Malnutrition of moderate degree 07/09/2023   (HFpEF) heart failure with preserved ejection fraction (HCC) 07/08/2023   Pulmonary  hypertension (HCC) 07/08/2023   Acute hypoxic respiratory failure (HCC) 07/07/2023   Multiple rib fractures 07/07/2023   Compression fracture of T3 vertebra (HCC) 07/07/2023   Dysphagia 07/07/2023   Pain due to onychomycosis of toenails of both feet 12/10/2021   Osteoarthritis (arthritis due to wear and tear of joints) 08/13/2021   Hyperlipidemia 08/13/2021   Elevated troponin 04/08/2021   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   Paroxysmal atrial fibrillation (HCC) 10/21/2020   Fall    Leukocytosis    Acute kidney injury superimposed on CKD (HCC)    Atrial fibrillation (HCC) 10/11/2020   Atrial fibrillation with rapid ventricular response (HCC) 10/10/2020   B12 deficiency 04/07/2016   Acid reflux 04/07/2016   HLD (hyperlipidemia) 04/07/2016   Essential hypertension 04/07/2016   Arthritis, degenerative 04/07/2016   OP (osteoporosis) 04/07/2016   Primary osteoarthritis of knee 05/06/2015   Hypothyroidism 02/25/2015   CKD (chronic kidney disease), stage IIIb 02/25/2015   Calcium blood increased 02/25/2015   Hypercalcemia 02/25/2015   Chronic kidney disease, stage 3a (HCC) 02/25/2015   Baker's cyst of knee 05/30/2014   PCP:  Gracelyn Nurse, MD Pharmacy:   [facility] - [place], [place] - [address] [place] [address] Phone: [phone] Fax: [phone]  [facility] - [place], [place] - [address] [place] [address] Phone: [phone] Fax: [phone]     Social Determinants of Health (SDOH) Social History: SDOH Screenings   Food Insecurity: Patient Unable To Answer (08/08/2023)  Housing: Patient Unable To Answer (08/08/2023)  Transportation Needs: Patient Unable To Answer (08/08/2023)  Utilities: Patient Unable To Answer (08/08/2023)  Tobacco Use: Low Risk  (08/08/2023)   SDOH Interventions:     Readmission Risk Interventions     No data to display

## 2023-08-08 NOTE — Progress Notes (Addendum)
[facility] Cardiology  CARDIOLOGY PROGRESS NOTE  Patient ID: Olivia Lewis MRN: [id number] DOB/AGE: [birth year] 89 y.o.  Admit date: 08/06/2023 Referring Physician Para March Primary Physician [facility] Primary Cardiologist [facility] Reason for Consultation atrial fibrillation  HPI: 89 year old female referred for evaluation of atrial fibrillation with rapid ventricular rate.  Patient recently hospitalized 07/07/2023 - 07/11/2023 with respiratory failure and multiple rib fractures after falling.  The patient had abnormal barium swallow on 08/03/2023 and has been residing at [facility].  Friends noted that the patient experienced a productive cough and confusion the patient was brought to [facility] ED where she was noted to be hypoxic.  White count was 18,000.  CT was negative for PE but showed bilateral lower lobe consolidation.  Patient admitted for bilateral pneumonia, possible aspiration.  ECG revealed atrial fibrillation at 133 bpm.  Patient has a history of paroxysmal atrial fibrillation, on metoprolol tartrate for rate and rhythm control.  The patient is not anticoagulated due to frequent falls.  The patient was started on amiodarone drip, and converted to sinus rhythm at 60 bpm.  Interval history:  -Patient reports feeling better overall today. Denies chest pain. -SOB much improved, endorses productive cough. Remains on 2L [place]. -HR well controlled, patient denies any dizziness or palpitations.   Review of systems complete and found to be negative unless listed above     Past Medical History:  Diagnosis Date   Allergy    Anemia    Arthritis    B12 deficiency    Basal cell carcinoma    face, R arm, [facility] Dr[facility] Orson [facility] and [facility]   [facility])    skin--face and arms   Cataracts, bilateral    CHF (congestive heart failure) (HCC)    Chickenpox    CKD (chronic kidney disease), stage III (HCC)    Diverticulosis    GERD (gastroesophageal reflux disease)    Hyperlipidemia    Hypertension     Hypothyroidism    Osteoporosis    Squamous cell carcinoma of skin 05/01/2021   right distal tricep at elbow, left forearm near elbow, right dorsum hand, left mid lat forearm - all treated with EDC   Squamous cell carcinoma of skin 05/01/2021   right calf - EDC 07/14/21   Squamous cell carcinoma of skin 08/18/2021   R sup calf - ED&C   Squamous cell carcinoma of skin 12/29/2021   L mandible - ED&C   Squamous cell carcinoma of skin 12/29/2021   L lat elbow - ED&C   Squamous cell carcinoma of skin 12/29/2021   L lat tricep - ED&C   Squamous cell carcinoma of skin 05/11/2022   Right deltoid - EDC   Squamous cell carcinoma of skin 05/11/2022   Left forearm - in situ - EDC    Past Surgical History:  Procedure Laterality Date   BUNIONECTOMY Bilateral    CARDIAC CATHETERIZATION     CATARACT EXTRACTION Bilateral    DILATION AND CURETTAGE OF UTERUS     EYE SURGERY Bilateral    cataract  surgery   JOINT REPLACEMENT Right    total knee replacement   meniscus tear Right    multiple skin cancer removal     NEUROMA SURGERY Bilateral    bilateral feet   rotator cuff surgery Right    TOTAL KNEE ARTHROPLASTY Left 05/06/2015   Procedure: TOTAL KNEE ARTHROPLASTY;  Surgeon: Kennedy Bucker, MD;  Location: [facility] ORS;  Service: Orthopedics;  Laterality: Left;    (Not in a hospital admission)  Social History   Socioeconomic History   Marital status: Widowed    Spouse name: Not on file   Number of children: Not on file   Years of education: Not on file   Highest education level: Not on file  Occupational History   Not on file  Tobacco Use   Smoking status: Never   Smokeless tobacco: Never  Vaping Use   Vaping status: Never Used  Substance and Sexual Activity   Alcohol use: No   Drug use: No   Sexual activity: Not Currently  Other Topics Concern   Not on file  Social History Narrative   Not on file   Social Determinants of Health   Financial Resource Strain: Not on file  Food  Insecurity: Patient Unable To Answer (08/08/2023)   Hunger Vital Sign    Worried About Running Out of Food in the Last Year: Patient unable to answer    Ran Out of Food in the Last Year: Patient unable to answer  Transportation Needs: Patient Unable To Answer (08/08/2023)   PRAPARE - Transportation    Lack of Transportation (Medical): Patient unable to answer    Lack of Transportation (Non-Medical): Patient unable to answer  Physical Activity: Not on file  Stress: Not on file  Social Connections: Not on file  Intimate Partner Violence: Patient Unable To Answer (08/08/2023)   Humiliation, Afraid, Rape, and Kick questionnaire    Fear of Current or Ex-Partner: Patient unable to answer    Emotionally Abused: Patient unable to answer    Physically Abused: Patient unable to answer    Sexually Abused: Patient unable to answer    Family History  Problem Relation Age of Onset   Hypertension Mother    Hypertension Father    Diabetes Sister    Breast cancer Sister    Colon cancer Sister    Bone cancer Brother       Review of systems complete and found to be negative unless listed above      PHYSICAL EXAM  General: Well developed, well nourished, in no acute distress HEENT:  Normocephalic and atramatic Neck:  No JVD.  Lungs: Clear bilaterally to auscultation and percussion. Heart: HRRR. Normal S1 and S2 without gallops or murmurs.  Abdomen: Bowel sounds are positive, abdomen soft and non-tender  Msk:  Back normal, normal gait. Normal strength and tone for age. Extremities: No clubbing, cyanosis or edema.   Neuro: Alert and oriented X 3. Psych:  Good affect, responds appropriately  Labs:   Lab Results  Component Value Date   WBC 12.4 (H) 08/08/2023   HGB 14.6 08/08/2023   HCT 43.7 08/08/2023   MCV 89.4 08/08/2023   PLT 268 08/08/2023    Recent Labs  Lab 08/08/23 0516  NA 137  K 3.7  CL 97*  CO2 27  BUN 23  CREATININE 0.81  CALCIUM 9.0  GLUCOSE 83   Lab Results   Component Value Date   TROPONINI <0.03 02/12/2018    Lab Results  Component Value Date   CHOL 130 04/09/2021   Lab Results  Component Value Date   HDL 49 04/09/2021   Lab Results  Component Value Date   LDLCALC 72 04/09/2021   Lab Results  Component Value Date   TRIG 47 04/09/2021   Lab Results  Component Value Date   CHOLHDL 2.7 04/09/2021   No results found for: "LDLDIRECT"    Radiology: CT Angio Chest PE W and/or Wo Contrast  Result Date: 08/06/2023  CLINICAL DATA:  Hypoxia and shortness of breath EXAM: CT ANGIOGRAPHY CHEST WITH CONTRAST TECHNIQUE: Multidetector CT imaging of the chest was performed using the standard protocol during bolus administration of intravenous contrast. Multiplanar CT image reconstructions and MIPs were obtained to evaluate the vascular anatomy. RADIATION DOSE REDUCTION: This exam was performed according to the departmental dose-optimization program which includes automated exposure control, adjustment of the mA and/or kV according to patient size and/or use of iterative reconstruction technique. CONTRAST:  75mL OMNIPAQUE IOHEXOL 350 MG/ML SOLN COMPARISON:  Chest x-ray from earlier in the same day. CT from 07/09/2023 FINDINGS: Cardiovascular: Atherosclerotic calcifications of the thoracic aorta are noted. The degree of opacification is limited precluding evaluation for dissection. No aneurysmal dilatation is seen. Coronary calcifications are noted. The pulmonary artery shows a normal branching pattern bilaterally. No filling defect to suggest pulmonary embolism is identified. Mediastinum/Nodes: Thoracic inlet is within normal limits. No hilar or mediastinal adenopathy is noted. The esophagus as visualized is within normal limits. Lungs/Pleura: Mild lower lobe consolidation is noted bilaterally. No bronchial obstructive changes are seen. No focal nodule or sizable pleural effusion is seen. Upper Abdomen: Visualized upper abdomen is within normal limits.  Musculoskeletal: Degenerative changes of the thoracic spine are noted. Multiple left-sided rib fractures are again seen stable from the prior study. No underlying pneumothorax is seen. Right-sided rib fractures are noted as well. Increased kyphosis is noted with upper thoracic compression deformity stable from the prior exam. Chronic manubrial and sternal fractures are seen. Review of the MIP images confirms the above findings. IMPRESSION: No evidence of pulmonary emboli. Bilateral lower lobe consolidation. Multiple rib fractures are noted bilaterally stable from the prior exam. Chronic thoracic compression deformities and sternal fractures are again noted. Aortic Atherosclerosis (ICD10-I70.0). Electronically Signed   By: Alcide Clever M.D.   On: 08/06/2023 23:00   DG Chest 1 View  Result Date: 08/06/2023 CLINICAL DATA:  Short of breath, possible aspiration EXAM: CHEST  1 VIEW COMPARISON:  05/18/2023, 07/09/2023 FINDINGS: 2 frontal views of the chest demonstrates stable enlargement of the cardiac silhouette. No acute airspace disease, effusion, or pneumothorax. Displaced left lateral fourth through seventh rib fractures are noted. Multiple prior healed bilateral rib fractures are again noted. Severe degenerative changes of the right shoulder. IMPRESSION: 1. No acute airspace disease. 2. Acute to subacute displaced left lateral fourth through seventh rib fractures. Electronically Signed   By: Sharlet Salina M.D.   On: 08/06/2023 18:37   DG SWALLOW FUNC OP MEDICARE SPEECH PATH  Result Date: 08/03/2023 Table formatting from the original result was not included. Modified Barium Swallow Study Patient Details Name: Olivia Lewis MRN: [id number] Date of Birth: 1928/12/06 Today's Date: 08/03/2023 HPI/PMH: HPI: Pt is a 89 y.o. female with medical history significant of Kyphosis ("sometimes wear a neck brace at home"), Gastric REFLUX, HFpEF, CKD stage III, hypertension, hyperlipidemia, osteoporosis, hypothyroidism, and  frequent Falls who presents to the ED on 07/11/2023 s/p Fall, rib fxs. Pt requires support w/ all ADLs at [facility]. Pt has Severe Kyphotic positioning impacting head up position for oral intake, sitting in chair. Clinical Impression Patient appears to present w/ mild-moderate chronic oropharyngeal phase dysphagia and Esophageal phase dysphagia. Contributing factors impacting swallowing include severe Kyphotic posture and structural age-related changes along w/ advance age(89yo). Oral phase is characterized by slow mastication and A-P transfer -- suspect impact of Gravity from head down/forwad position. Slight-min residue appeared to clear w/ f/u, Dry swallow. Swallow initiation appeared consistent at the level of the  Valleculae. Pharyngeal phase is noted for functional base of tongue retraction and peristalsis w/ no pharyngeal residue collection post swallow. OF NOTE, suspected partial PE Segment opening/obstruction(?) appeared to contribute to decreased pharyngeal and PE Segment clearance of bolus material w/ all trials resulting in closure preventing full bolus clearance of the area -- a min-mod amount of the bolus remained and/or returned in the pharynx/Pyriform Sinuses post swallow. No obvious laryngeal penetration nore aspiration occurred d/t the pharyngeal residue, although, pt is at increased risk of such to occur.  Subsequent Dry swallow appeared somewhat effective in reducing/clearing the Pyriform Sinus residue. Many of the other pharyngeal components of swallowing(epiglottic inversion, exursion, BOT retraction) were difficult to assess d/t significantly obscured view d/t the Kyphotic positioning. No obvious aspiration occurred during trials given; possible slight/brief laryngeal penetration during larger sips of thin liquids (via straw) noted but no build-up w/ f/u trial. OF NOTE: pt exhibited Esophageal phase Dysmotility w/ retrograde flow to/through the pyriform sinuses; bolus stasis in mid-Esophagus  intermittently.  Due to pt's positioning and discomfort during the exam, further trials were not given and exam ended.  Pt and Family member were given Education post MBSS. Recommended strategies to include: small, single bites/sips, clear oropharyngnx w/ Dry swallow prior to taking another bite/sip, alternate food/liquid. Head forward posture w/ oral intake. Oral care prior to meals to reduce oral bacterial load. STRICT REFLUX PRECAUTIONS including remaining upright for ~45-60 mins post meals. Recommend to continue to monitor for any negative sequelae from potential aspiration/REFLUX aspiration and consult MD, Palliative Care. Factors that may increase risk of adverse event in presence of aspiration [employer]s 2021): Poor general health and/or compromised immunity;Limited mobility;Frail or deconditioned;Dependence for feeding and/or oral hygiene (Kyphotic positioning) Recommendations/Plan: Swallowing Evaluation Recommendations Swallowing Evaluation Recommendations Recommendations: PO diet PO Diet Recommendation: Dysphagia 2 (Finely chopped); Dysphagia 3 (Mechanical soft); Thin liquids (Level 0) (trials of minced-mech soft foods at BEDSIDE w/ Facility SLP monitoring toleration and making further recommendations.) Liquid Administration via: Straw (baseline) Medication Administration: Crushed with puree Supervision: Full assist for feeding; Full supervision/cueing for swallowing strategies Swallowing strategies  : Minimize environmental distractions; Slow rate; Small bites/sips; Multiple dry swallows after each bite/sip; Follow solids with liquids; Clear throat intermittently Postural changes: Position pt fully upright for meals; Stay upright 30-60 min after meals; Out of bed for meals (REFLUX precs.) Oral care recommendations: Oral care BID (2x/day); Oral care before PO; Staff/trained caregiver to provide oral care Recommended consults: Consider GI consultation; Consider esophageal assessment; Consider  dietitian consultation; Consider Palliative care (for further Education and Information) Treatment Plan Treatment Plan Treatment recommendations: Defer treatment plan to SLP at other venue (see follow-up recommendations) (continue f/u at SNF) Follow-up recommendations: Skilled nursing-short term rehab (<3 hours/day) Functional status assessment: Patient has had a recent decline in their functional status and/or demonstrates limited ability to make significant improvements in function in a reasonable and predictable amount of time. Treatment frequency: -- (TBD) Treatment duration: -- (TBD) Interventions: Patient/family education; Compensatory techniques; Diet toleration management by SLP; Trials of upgraded texture/liquids (under Supervision of Rehab SLP at SNF) Recommendations Recommendations for follow up therapy are one component of a multi-disciplinary discharge planning process, led by the attending physician.  Recommendations may be updated based on patient status, additional functional criteria and insurance authorization. Assessment: Orofacial Exam: Orofacial Exam Oral Cavity: Oral Hygiene: WFL Oral Cavity - Dentition: Adequate natural dentition Orofacial Anatomy: WFL Oral Motor/Sensory Function: WFL Anatomy: Anatomy: Prominent cricopharyngeus (strongly suspected.  Posture: severe Kyphotic appearance) Boluses  Administered: Boluses Administered Boluses Administered: Thin liquids (Level 0); Moderately thick liquids (Level 3, honey thick); Puree; Solid  Oral Impairment Domain: Oral Impairment Domain Lip Closure: No labial escape Tongue control during bolus hold: Cohesive bolus between tongue to palatal seal Bolus preparation/mastication: Slow prolonged chewing/mashing with complete recollection (more anterior chewing d/t Kyphotic positioning and head down/forward(gravity)) Bolus transport/lingual motion: -- (Functional) Oral residue: Complete oral clearance (functional in setting of head down/forward) Location  of oral residue : N/A Initiation of pharyngeal swallow : Valleculae (appeared)  Pharyngeal Impairment Domain: Pharyngeal Impairment Domain Soft palate elevation: No bolus between soft palate (SP)/pharyngeal wall (PW) Laryngeal elevation: -- (could not assess fully) Anterior hyoid excursion: -- (could not assess fully) Epiglottic movement: Partial inversion (-Complete inversion) Pharyngeal contraction (A/P view only): N/A Pharyngoesophageal segment opening: Partial distention/partial duration, partial obstruction of flow Tongue base retraction: -- (could not assess fully) Pharyngeal residue: Collection of residue within or on pharyngeal structures Location of pharyngeal residue: Pyriform sinuses  Esophageal Impairment Domain: Esophageal Impairment Domain Esophageal clearance upright position: Esophageal retention with retrograde flow through the PES Pill: Pill Consistency administered: -- (n/a) Penetration/Aspiration Scale Score: Penetration/Aspiration Scale Score 1.  Material does not enter airway: Moderately thick liquids (Level 3, honey thick); Puree; Solid 2.  Material enters airway, remains ABOVE vocal cords then ejected out: Thin liquids (Level 0) (No buildup observed -- not fully certain there was laryngeal penetration?) Compensatory Strategies: Compensatory Strategies Compensatory strategies: Yes Multiple swallows: Effective   General Information: Caregiver present: Yes  Diet Prior to this Study: Dysphagia 1 (pureed); Thin liquids (Level 0) (when admitted to the hospital on 07/11/23 w/ trials to upgrade when she returned back to her Faciility and felt medically improved, stronger.)   No data recorded  Respiratory Status: WFL   Supplemental O2: Nasal cannula (2L)   History of Recent Intubation: No  Behavior/Cognition: Alert; Cooperative; Pleasant mood; Distractible; Requires cueing Self-Feeding Abilities: Dependent for feeding Baseline vocal quality/speech: Normal (Gravely) Volitional Cough: Able to elicit No  data recorded Exam Limitations: Poor participation; Poor positioning; Fatigue Goal Planning: Prognosis for improved oropharyngeal function: Guarded (-Fair) Barriers to Reach Goals: Time post onset; Severity of deficits (Kyphotic positioning) Barriers/Prognosis Comment: Kyphotic positioning; advanced age Patient/Family Stated Goal: gave understanding of information provided Consulted and agree with results and recommendations: Chief of Staff; Patient Pain: Pain Assessment Pain Assessment: 0-10 Pain Score: 4 Pain Location: "all over" moving to transfer to swallow chair Pain Descriptors / Indicators: Discomfort; Grimacing; Guarding; Moaning Pain Intervention(s): Monitored during session; Repositioned; Relaxation End of Session: Start Time:SLP Start Time (ACUTE ONLY): 1245 Stop Time: SLP Stop Time (ACUTE ONLY): 1415 Time Calculation:SLP Time Calculation (min) (ACUTE ONLY): 90 min Charges: SLP Evaluations $ SLP Speech Visit: 1 Visit SLP Evaluations $MBS Swallow: 1 Procedure SLP visit diagnosis: SLP Visit Diagnosis: Dysphagia, pharyngoesophageal phase (R13.14) (Kyphotic positioning) Past Medical History: Past Medical History: Diagnosis Date  Allergy   Anemia   Arthritis   B12 deficiency   Basal cell carcinoma   face, R arm, txted by Dr. Orson Aloe and [facility]  [facility])   skin--face and arms  Cataracts, bilateral   CHF (congestive heart failure) (HCC)   Chickenpox   CKD (chronic kidney disease), stage III (HCC)   Diverticulosis   GERD (gastroesophageal reflux disease)   Hyperlipidemia   Hypertension   Hypothyroidism   Osteoporosis   Squamous cell carcinoma of skin 05/01/2021  right distal tricep at elbow, left forearm near elbow, right dorsum hand, left mid lat forearm - all  treated with EDC  Squamous cell carcinoma of skin 05/01/2021  right calf - EDC 07/14/21  Squamous cell carcinoma of skin 08/18/2021  R sup calf - ED&C  Squamous cell carcinoma of skin 12/29/2021  L mandible - ED&C  Squamous cell carcinoma of  skin 12/29/2021  L lat elbow - ED&C  Squamous cell carcinoma of skin 12/29/2021  L lat tricep - ED&C  Squamous cell carcinoma of skin 05/11/2022  Right deltoid - EDC  Squamous cell carcinoma of skin 05/11/2022  Left forearm - in situ - EDC Past Surgical History: Past Surgical History: Procedure Laterality Date  BUNIONECTOMY Bilateral   CARDIAC CATHETERIZATION    CATARACT EXTRACTION Bilateral   DILATION AND CURETTAGE OF UTERUS    EYE SURGERY Bilateral   cataract  surgery  JOINT REPLACEMENT Right   total knee replacement  meniscus tear Right   multiple skin cancer removal    NEUROMA SURGERY Bilateral   bilateral feet  rotator cuff surgery Right   TOTAL KNEE ARTHROPLASTY Left 05/06/2015  Procedure: TOTAL KNEE ARTHROPLASTY;  Surgeon: Kennedy Bucker, MD;  Location: [facility] ORS;  Service: Orthopedics;  Laterality: Left; Jerilynn Som, MS, CCC-SLP Speech Language Pathologist Rehab Services; [facility] -  [phone] (ascom) Watson,Katherine 08/03/2023, 6:48 PM CLINICAL DATA:  Oropharyngeal dysphagia EXAM: MODIFIED BARIUM SWALLOW TECHNIQUE: Different consistencies of barium were administered orally to the patient by the Speech Pathologist. Imaging of the pharynx was performed in the lateral projection. Mina Marble, PA-C was present in the fluoroscopy room during this study, which was supervised and interpreted by Dr. Elige Ko. FLUOROSCOPY: Radiation Exposure Index (as provided by the fluoroscopic device): 62.80 mGy Kerma COMPARISON:  None Available. FINDINGS: Vestibular  Penetration:  None visualized. Aspiration:  None visualized. Other:  Severe kyphosis of cervical spine obstructing visualization IMPRESSION: Severe kyphosis cervical spine limited evaluation today. No vestibular penetration or aspiration visualized on today's exam. Please refer to the Speech Pathologists report for complete details and recommendations. Electronically Signed   By: Elige Ko M.D.   On: 08/03/2023 15:12  CT Angio Chest Pulmonary  Embolism (PE) W or WO Contrast  Result Date: 07/09/2023 CLINICAL DATA:  Positive D-dimer. EXAM: CT ANGIOGRAPHY CHEST WITH CONTRAST TECHNIQUE: Multidetector CT imaging of the chest was performed using the standard protocol during bolus administration of intravenous contrast. Multiplanar CT image reconstructions and MIPs were obtained to evaluate the vascular anatomy. RADIATION DOSE REDUCTION: This exam was performed according to the departmental dose-optimization program which includes automated exposure control, adjustment of the mA and/or kV according to patient size and/or use of iterative reconstruction technique. CONTRAST:  75mL OMNIPAQUE IOHEXOL 350 MG/ML SOLN COMPARISON:  CT chest without contrast 07/07/2023 FINDINGS: Cardiovascular: Satisfactory opacification of the pulmonary arteries to the segmental level. No evidence of pulmonary embolism. Heart is moderately enlarged. No pericardial effusion. There are atherosclerotic calcifications of the aorta and coronary arteries. Mediastinum/Nodes: No enlarged mediastinal, hilar, or axillary lymph nodes. Thyroid gland, trachea, and esophagus demonstrate no significant findings. Lungs/Pleura: There is scarring in both lung apices. There small bilateral pleural effusions similar to prior examination. There is a small amount of atelectasis/airspace disease in the bilateral lower lobes, new/increasing from prior. Trachea and central airways are patent. There is no pneumothorax. Upper Abdomen: No acute abnormality. Musculoskeletal: Acute second, third, fourth, fifth and sixth rib fractures appear similar to the prior examination. Multiple chronic healed rib fractures are again noted bilaterally. There is accentuated kyphosis of the thoracic spine similar to prior. Chronic sternal and manubrial fractures appear  unchanged. Chronic fractures of T1, T2 and T3 are unchanged. Review of the MIP images confirms the above findings. IMPRESSION: 1. No evidence for pulmonary  embolism. 2. Stable small bilateral pleural effusions. 3. New/increasing bilateral lower lobe atelectasis/airspace disease. 4. Stable cardiomegaly. 5. Aortic atherosclerosis. Aortic Atherosclerosis (ICD10-I70.0). Electronically Signed   By: Darliss Cheney M.D.   On: 07/09/2023 21:11    EKG: Atrial fibrillation at 133 bpm Telemetry reviewed (08/08/23): sinus rhythm PACs, rate 50-60s Data reviewed (08/08/23): last 24h vitals tele labs imaging I/O hospitalist progress notes  ASSESSMENT AND PLAN:   1.  Atrial fibrillation with rapid ventricular rate, converted to sinus rhythm on amiodarone infusion, with history of paroxysmal atrial fibrillation, on metoprolol tartrate for rate and rhythm control, not anticoagulated due to history of frequent falls 2.  Bilateral pneumonia, possible aspiration in nature, on ceftriaxone and doxycycline 3.  Frequent falls, multiple rib fractures 4.  CKD stage IIIa, renal function stable  Recommendations  1.  Agree with overall current therapy 2.  Defer chronic anticoagulation with history of multiple falls, recent rib fractures 3.  Transition amiodarone infusion to p.o. 100 mg twice daily. Monitor for bradycardia. 4.  Continue metoprolol tartrate 37.5 mg p.o. twice daily for rate control. 5.  Further recommendations pending patient's initial clinical course  This patient's plan of care was discussed and created with Dr. Melton Alar and she is in agreement.    SignedGale Journey, PA-C  08/08/2023, 10:14 AM

## 2023-08-08 NOTE — Evaluation (Signed)
Clinical/Bedside Swallow Evaluation Patient Details  Name: Olivia Lewis MRN: [id number] Date of Birth: [birth year]  Today's Date: 08/08/2023 Time: SLP Start Time (ACUTE ONLY): 1000 SLP Stop Time (ACUTE ONLY): 1100 SLP Time Calculation (min) (ACUTE ONLY): 60 min  Past Medical History:  Past Medical History:  Diagnosis Date   Allergy    Anemia    Arthritis    B12 deficiency    Basal cell carcinoma    face, R arm, txted by Dr. Orson Aloe and [facility]   Cancer [facility])    skin--face and arms   Cataracts, bilateral    CHF (congestive heart failure) (HCC)    Chickenpox    CKD (chronic kidney disease), stage III (HCC)    Diverticulosis    GERD (gastroesophageal reflux disease)    Hyperlipidemia    Hypertension    Hypothyroidism    Osteoporosis    Squamous cell carcinoma of skin 05/01/2021   right distal tricep at elbow, left forearm near elbow, right dorsum hand, left mid lat forearm - all treated with EDC   Squamous cell carcinoma of skin 05/01/2021   right calf - EDC 07/14/21   Squamous cell carcinoma of skin 08/18/2021   R sup calf - ED&C   Squamous cell carcinoma of skin 12/29/2021   L mandible - ED&C   Squamous cell carcinoma of skin 12/29/2021   L lat elbow - ED&C   Squamous cell carcinoma of skin 12/29/2021   L lat tricep - ED&C   Squamous cell carcinoma of skin 05/11/2022   Right deltoid - EDC   Squamous cell carcinoma of skin 05/11/2022   Left forearm - in situ - EDC   Past Surgical History:  Past Surgical History:  Procedure Laterality Date   BUNIONECTOMY Bilateral    CARDIAC CATHETERIZATION     CATARACT EXTRACTION Bilateral    DILATION AND CURETTAGE OF UTERUS     EYE SURGERY Bilateral    cataract  surgery   JOINT REPLACEMENT Right    total knee replacement   meniscus tear Right    multiple skin cancer removal     NEUROMA SURGERY Bilateral    bilateral feet   rotator cuff surgery Right    TOTAL KNEE ARTHROPLASTY Left 05/06/2015   Procedure: TOTAL KNEE  ARTHROPLASTY;  Surgeon: Kennedy Bucker, MD;  Location: [facility] ORS;  Service: Orthopedics;  Laterality: Left;   HPI:  Pt is a 89 y.o. female with medical history significant for HFpEF, CKD stage IIIa, hypertension, hyperlipidemia, osteoporosis, hypothyroidism, and frequent Falls, recently hospitalized from 8/15-8/19/24 with respiratory failure secondary to multiple rib fractures from a Fall on her walker.  Pt was seen on 08/03/23 for a Outpatient MBSS for suspeted (chronic) dysphagia who was sent from [facility] for evaluation of shortness of breath, productive cough and confusion that was noticed when her Elesa Hacker friends visited her today.  History is given by  friends at bedside, [name] and [name] who stated that they saw the patient on the day prior and she had a cough and was mildly confused but on the day of arrival she was much more confused.  They state that at baseline patient is very sharp and with it and manages her own affairs and is the power of attorney of her sister who is on the memory care unit.  On arrival of EMS she was reportedly saturating in the 60s on room air and was placed on oxygen, requiring up to 6 L to maintain sats in the 90s.  Per the MBSS on 08/03/23: "Patient appears to present w/ mild-moderate chronic oropharyngeal phase dysphagia and Esophageal phase dysphagia. Contributing factors impacting swallowing include Severe Kyphotic posture(head down position), structural age-related changes, and advance age(89yo).   Oral phase is characterized by slow mastication and A-P transfer -- suspect impact of Gravity from head down/forwad Severe Kyphotic position. Pt exhibited Esophageal phase Dysmotility w/ retrograde flow to/through the pyriform sinuses during this study; bolus stasis fairly consistently in mid-Esophagus. This can increase risk for aspiration of REFLUX material thus Pulmoary decline. Pt is also at risk for not meeting her nutrition/hydration needs adequately.".  It was recommended that  pt trial upgrading her diet (foods) consistency w/ SNF SLP at bedside to determine which foods "worked best" in setting of her Chronic swallowing issues and Esophageal phase Dysmotility.  OF NOTE: Pt stated the swallowing issues had been ongoing for "~2 years" w/ worsening in the last "~6 months" and moreso s/p last Fall/hospitalization (trauma overall).   CT of Chest: Mild lower lobe consolidation is noted bilaterally. No  bronchial obstructive changes are seen. No focal nodule or sizable  pleural effusion is seen.    Assessment / Plan / Recommendation  Clinical Impression   Pt seen for BSE this morning in the ED. Pt awake, verbal and followed instructions by this SLP. Noted pt's Severely Kyphotic positioning in bed and supported as best able to allow pt head up ROM.  On [place] O2 support 2-3L, afebrile. WBC trending down.  Per the MBSS on 08/03/2023: "Patient appears to present w/ mild-moderate chronic oropharyngeal phase dysphagia and Esophageal phase dysphagia. Contributing factors impacting swallowing include severe Kyphotic posture(head down position), structural age-related changes, and advance age(89yo).  During the MBSS, oral phase was characterized by slow mastication and A-P transfer of increased textured foods -- suspect impact of Gravity from head down/forwad position. Slight-min residue appeared to clear w/ f/u, Dry swallow.  Swallow initiation appeared consistent at the level of the Valleculae for trial consistencies.  Pharyngeal phase is noted for functional base of tongue retraction and peristalsis w/ no overt pharyngeal residue collection post swallow. OF NOTE, suspected partial PE Segment opening/obstruction(?) appeared to contribute to decreased bolus clearance through the pharyngeal>PE Segment w/ all trials -- this resulted in potential premature closure preventing full bolus clearance through the area. A min-mod amount of the bolus material remained and/or returned into the  pharynx/Pyriform Sinuses post swallow. No obvious laryngeal penetration nor aspiration occurred d/t this pharyngeal residue, although, pt is at increased risk of such to occur. Subsequent Dry swallow appeared somewhat effective in reducing/clearing the Pyriform Sinus residue. Many of the other pharyngeal components of swallowing(epiglottic inversion, excursion, BOT retraction) were difficult to assess d/t Significantly obscured view of the pharynx d/t the Kyphotic positioning. No obvious aspiration occurred during trials given; possible slight/brief laryngeal penetration during larger sips of thin liquids (via straw) noted but no build-up w/ f/u trial. Encouraged small, single sips for best control and reduced risk for aspiration.  OF NOTE: pt exhibited Esophageal phase Dysmotility w/ retrograde flow to/through the pyriform sinuses during this study; bolus stasis fairly consistently in mid-Esophagus. This can increase risk for aspiration of REFLUX material. Due to pt's positioning and discomfort during the exam, further trials were not given and exam ended."  During this BSE, pt exhibited functional oropharyngeal phase swallowing of trials of thin liquids via Small, Single straw sips provided by SLP; puree (food) trials. NO overt, clinical s/s of aspiration w/ po trials given. Pt exhibited grossly  adequate oropharyngeal phase swallowing w/ no overt sensorimotor deficits noted.  Pt appears at Reduced risk for aspiration following general aspiration precautions.  However, pt does have challenging factors that could impact her oropharyngeal swallowing to include severely Kyphotic head positioning, deconditioning/weakness, advanced age as well as Esophageal phase Dysmotility w/ Retrograde flow to/through the pyriform sinuses during this study; bolus stasis fairly consistently in mid-Esophagus. This can increase risk for aspiration of REFLUX material. These factors can increase risk for decreased oral intake  overall.  During po trials, pt consumed consistencies w/ no overt coughing, decline in vocal quality, or change in respiratory presentation during/post trials. O2 sats remained 98%. Oral phase appeared grossly [facility] w/ timely bolus management and control of bolus propulsion for A-P transfer for swallowing, min more time w/ purees. Oral clearing achieved w/ all trial consistencies.  Pt was instructed on using the f/u DRY SWALLOW to aid pharyngoesophageal clearing of any Retrograde bolus activity/residue. Pt followed this instruction/strategy appropriately. OM Exam appeared [facility] w/ no unilateral weakness noted. Speech Clear, at her baseline w/ gravely min quality. Pt required support w/ feeding d/t being in bed.   Recommend a PUREE consistency diet w/ well-moistened foods; Thin liquids -- carefully monitor straw use, and pt should help to Hold Cup when drinking = Single sips w/ f/u DRY SWALLOW post bites and sips. Recommend general aspiration precautions, positioning support and setup. Feeding support as needed. Pills CRUSHED in Puree for safer, easier swallowing and Esophageal clearing. This was rec'd prior. Alternate food/liquid to aid Esophageal clearing. Upright posture w/ oral intake. Oral care prior to meals to reduce oral bacterial load. STRICT REFLUX PRECAUTIONS including remaining upright for ~45-60 mins post meals. Recommend to continue to monitor for any negative sequelae from potential aspiration/REFLUX aspiration and consult MD, Palliative Care.  Education given on Pills in Puree; food consistency for diet; general aspiration precautions to pt and Dtr. NSG to reconsult if any new needs arise. NSG updated, agreed. MD updated. Recommend Dietician f/u for support. SLP Visit Diagnosis: Dysphagia, pharyngoesophageal phase (R13.14) (Kyphotic positioning; Esophageal phase Dysmotility)    Aspiration Risk  Mild aspiration risk;Risk for inadequate nutrition/hydration (from REFLUX aspiration from Esophageal  phase Retrograde activity and dysmotility overall)    Diet Recommendation   Thin;Dysphagia 1 (puree) (for ease of Esophageal clearing) = a PUREE consistency diet w/ well-moistened foods; Thin liquids -- carefully monitor straw use, and pt should help to Hold Cup when drinking = Single sips w/ f/u DRY SWALLOW post bites and sips. Recommend general aspiration precautions, positioning support and setup. Feeding support as needed. Alternate food/liquid to aid Esophageal clearing. Upright posture w/ oral intake. Oral care prior to meals to reduce oral bacterial load. STRICT REFLUX PRECAUTIONS including remaining upright for ~45-60 mins post meals.  Medication Administration: Crushed with puree    Other  Recommendations Recommended Consults: Consider GI evaluation;Consider esophageal assessment [facility] consult for GOC; Dietician) Oral Care Recommendations: Oral care BID;Oral care before and after PO;Patient independent with oral care (support)    Recommendations for follow up therapy are one component of a multi-disciplinary discharge planning process, led by the attending physician.  Recommendations may be updated based on patient status, additional functional criteria and insurance authorization.  Follow up Recommendations Follow physician's recommendations for discharge plan and follow up therapies (Education at SNF as needed)      Assistance Recommended at Discharge  FULL  Functional Status Assessment Patient has had a recent decline in their functional status and/or demonstrates limited ability  to make significant improvements in function in a reasonable and predictable amount of time  Frequency and Duration min 2x/week  2 weeks       Prognosis Prognosis for improved oropharyngeal function: Guarded (-Fair) Barriers to Reach Goals: Time post onset;Severity of deficits Barriers/Prognosis Comment: Kyphotic positioning; advanced age      Swallow Study   General Date of Onset:  08/06/23 HPI: Pt is a 89 y.o. female with medical history significant for HFpEF, CKD stage IIIa, hypertension, hyperlipidemia, osteoporosis, hypothyroidism, and frequent Falls, recently hospitalized from 8/15-8/19/24 with respiratory failure secondary to multiple rib fractures from a Fall on her walker.  Pt was seen on 08/03/23 for a Outpatient MBSS for suspeted (chronic) dysphagia who was sent from [facility] for evaluation of shortness of breath, productive cough and confusion that was noticed when her Elesa Hacker friends visited her today.  History is given by  friends at bedside, [name] and [name] who stated that they saw the patient on the day prior and she had a cough and was mildly confused but on the day of arrival she was much more confused.  They state that at baseline patient is very sharp and with it and manages her own affairs and is the power of attorney of her sister who is on the memory care unit.  On arrival of EMS she was reportedly saturating in the 60s on room air and was placed on oxygen, requiring up to 6 L to maintain sats in the 90s.  Per the MBSS on 08/03/23: "Patient appears to present w/ mild-moderate chronic oropharyngeal phase dysphagia and Esophageal phase dysphagia. Contributing factors impacting swallowing include Severe Kyphotic posture(head down position), structural age-related changes, and advance age(89yo).   Oral phase is characterized by slow mastication and A-P transfer -- suspect impact of Gravity from head down/forwad Severe Kyphotic position. Pt exhibited Esophageal phase Dysmotility w/ retrograde flow to/through the pyriform sinuses during this study; bolus stasis fairly consistently in mid-Esophagus. This can increase risk for aspiration of REFLUX material thus Pulmoary decline. Pt is also at risk for not meeting her nutrition/hydration needs adequately.".  It was recommended that pt trial upgrading her diet (foods) consistency w/ SNF SLP at bedside to determine which foods  "worked best" in setting of her Chronic swallowing issues and Esophageal phase Dysmotility.  OF NOTE: Pt stated the swallowing issues had been ongoing for "~2 years" w/ worsening in the last "~6 months" and moreso s/p last Fall/hospitalization (trauma overall).   CT of Chest: Mild lower lobe consolidation is noted bilaterally. No  bronchial obstructive changes are seen. No focal nodule or sizable  pleural effusion is seen. Type of Study: Bedside Swallow Evaluation Previous Swallow Assessment: MBSS on 08/03/23 - trials to upgrade to mech soft foods per Pt's Wishes Diet Prior to this Study: NPO (has been on a dysphagia level 1(puree foods) and thins w/ trials to upgrade at bedside w/ SLP supervision) Temperature Spikes Noted: No (wbc 12.4 trending down) Respiratory Status: Nasal cannula (2-4L) History of Recent Intubation: No Behavior/Cognition: Alert;Cooperative;Pleasant mood (follows instructions) Oral Cavity Assessment: Within Functional Limits Oral Care Completed by SLP: Recent completion by staff Oral Cavity - Dentition: Adequate natural dentition Vision: Functional for self-feeding Self-Feeding Abilities: Able to feed self;Needs assist;Total assist;Needs set up Patient Positioning: Postural control adequate for testing (supported behind back (low) d/t Severely Kyphotic positioning) Baseline Vocal Quality: Normal (gravely(chin down d/t kyphosis) - at her baseline) Volitional Cough:  (Fair) Volitional Swallow: Able to elicit    Oral/Motor/Sensory Function  Overall Oral Motor/Sensory Function: Within functional limits   Ice Chips Ice chips: Within functional limits Presentation: Spoon (fed; 2 trials)   Thin Liquid Thin Liquid: Within functional limits (grossly) Presentation: Straw (10 trials) Other Comments: pt took her time w/ A-P transfer and gave min effort w/ swallowing; audible swallows noted    Nectar Thick Nectar Thick Liquid: Not tested   Honey Thick Honey Thick Liquid: Not tested    Puree Puree: Impaired (min) Presentation: Spoon (fed; 10 trials) Oral Phase Impairments:  (wfl) Oral Phase Functional Implications: Prolonged oral transit (slight+) Pharyngeal Phase Impairments:  (wfl) Other Comments: pt took min time w/ A-P transfer and gave min effort w/ swallowing; audible swallows noted   Solid     Solid: Not tested Other Comments: d/t known Esophageal phase Dysmotility      Jerilynn Som, MS, CCC-SLP Speech Language Pathologist Rehab Services; [facility] [phone] (ascom) Shelanda Duvall 08/08/2023,4:32 PM

## 2023-08-08 NOTE — Progress Notes (Signed)
Patient having difficulty swallowing meds crushed in applesauce. All meds held for PM.

## 2023-08-08 NOTE — ED Notes (Signed)
C-spine collar applied to Pt

## 2023-08-08 NOTE — Plan of Care (Signed)
Problem: Activity: Goal: Ability to tolerate increased activity will improve Outcome: Progressing   Problem: Clinical Measurements: Goal: Ability to maintain a body temperature in the normal range will improve Outcome: Progressing   Problem: Education: Goal: Knowledge of General Education information will improve Description Including pain rating scale, medication(s)/side effects and non-pharmacologic comfort measures Outcome: Progressing

## 2023-08-08 NOTE — Consult Note (Signed)
Consultation Note Date: 08/08/2023   Patient Name: Olivia Lewis  DOB: [birth year]  MRN: [id number]  Age / Sex: 89 y.o., female  PCP: Gracelyn Nurse, MD Referring Physician: Darlin Priestly, MD  Reason for Consultation: Establishing goals of care  HPI/Patient Profile: 89 y.o. female  with past medical history of dysphagia, HFpEF, AF RVR, CKD stage 3a, HTN, HLD, osteoporosis, hypothyroidism, monoclonal B-cell lymphocytosis, frequent falls, recent hospitalization due to fall with rib fractures admitted on 08/06/2023 from [facility] with shortness of breath and productive cough worsening found to have bilateral pneumonia.   Clinical Assessment and Goals of Care: Consult received and chart review completed. I met today with Paxtyn and her friend, Dianna. Amandeep is awake, alert, oriented to converse. We reviewed her condition and decline. We discussed her goals of care. She confirms DNR/DNI as well as NO desire for feeding tube. Zhanae's only concern is ensuring that all is in place so that her sister, Leonie Douglas, in memory care is taken care of. Tieka was caring for Leonie Douglas for ~2 years just up until a few months ago. They have a nephew, Gabriel Rung, who is helping to take over care and responsibility for Leonie Douglas. Plans are in motion for Dianna to become Anberlin's HCPOA (previously this was Leonie Douglas).   Melasia is hopeful for time and some level of improvement. Main goal is to be able to go to the bank to ensure a trust is adjusted to be there to help care for her sister in the event of her own death. I encouraged them to speak with their attorney to see if they can assist with making this happen without having to attend the bank in person which has been an obstacle due to her own declining health.   Tantania has had a chance to speak with her pastor. She knows she is going to heaven and is at peace with her own mortality. [place] and  Dianna discuss her wishes for funeral. They plan to have ongoing discussions with knowledge of poor prognosis.   All questions/concerns addressed. Emotional support provided.   Primary Decision Maker PATIENT    SUMMARY OF RECOMMENDATIONS   - DNR/DNI - No feeding tube - Continue conservative measures - not ready for full comfort care - Friend Dianna is surrogate decision maker  Code Status/Advance Care Planning: DNR   Symptom Management:  Added scheduled guaifenesin to thin secretions at patient request.  Yankauer suction and humidity to oxygen added per RT.  Adjusted to thickened liquids as thickened liquids more comfortable for patient.   Prognosis:  Very poor with significant dysphagia.   Discharge Planning: Skilled Nursing Facility for rehab with Palliative care service follow-up      Primary Diagnoses: Present on Admission:  Essential hypertension  Hypothyroidism  Acute respiratory failure with hypoxia (HCC)  Monoclonal B-cell lymphocytosis of undetermined significance  Paroxysmal atrial fibrillation (HCC)  Chronic kidney disease, stage 3a (HCC)   I have reviewed the medical record, interviewed the patient and family, and examined  the patient. The following aspects are pertinent.  Past Medical History:  Diagnosis Date   Allergy    Anemia    Arthritis    B12 deficiency    Basal cell carcinoma    face, R arm, txted by Dr. Orson Aloe and [facility]   Cancer [facility])    skin--face and arms   Cataracts, bilateral    CHF (congestive heart failure) (HCC)    Chickenpox    CKD (chronic kidney disease), stage III (HCC)    Diverticulosis    GERD (gastroesophageal reflux disease)    Hyperlipidemia    Hypertension    Hypothyroidism    Osteoporosis    Squamous cell carcinoma of skin 05/01/2021   right distal tricep at elbow, left forearm near elbow, right dorsum hand, left mid lat forearm - all treated with EDC   Squamous cell carcinoma of skin 05/01/2021   right calf -  EDC 07/14/21   Squamous cell carcinoma of skin 08/18/2021   R sup calf - ED&C   Squamous cell carcinoma of skin 12/29/2021   L mandible - ED&C   Squamous cell carcinoma of skin 12/29/2021   L lat elbow - ED&C   Squamous cell carcinoma of skin 12/29/2021   L lat tricep - ED&C   Squamous cell carcinoma of skin 05/11/2022   Right deltoid - EDC   Squamous cell carcinoma of skin 05/11/2022   Left forearm - in situ - [facility]   Social History   Socioeconomic History   Marital status: Widowed    Spouse name: Not on file   Number of children: Not on file   Years of education: Not on file   Highest education level: Not on file  Occupational History   Not on file  Tobacco Use   Smoking status: Never   Smokeless tobacco: Never  Vaping Use   Vaping status: Never Used  Substance and Sexual Activity   Alcohol use: No   Drug use: No   Sexual activity: Not Currently  Other Topics Concern   Not on file  Social History Narrative   Not on file   Social Determinants of Health   Financial Resource Strain: Not on file  Food Insecurity: Patient Unable To Answer (08/08/2023)   Hunger Vital Sign    Worried About Running Out of Food in the Last Year: Patient unable to answer    Ran Out of Food in the Last Year: Patient unable to answer  Transportation Needs: Patient Unable To Answer (08/08/2023)   PRAPARE - Administrator, Civil Service (Medical): Patient unable to answer    Lack of Transportation (Non-Medical): Patient unable to answer  Physical Activity: Not on file  Stress: Not on file  Social Connections: Not on file   Family History  Problem Relation Age of Onset   Hypertension Mother    Hypertension Father    Diabetes Sister    Breast cancer Sister    Colon cancer Sister    Bone cancer Brother    Scheduled Meds:  acetylcysteine  4 mL Nebulization BID   amiodarone  100 mg Oral BID   doxycycline  100 mg Oral Q12H   enoxaparin (LOVENOX) injection  40 mg Subcutaneous Q24H    ipratropium-albuterol  3 mL Nebulization BID   levothyroxine  50 mcg Oral Q0600   lisinopril  10 mg Oral Daily   metoprolol tartrate  37.5 mg Oral BID   pantoprazole  40 mg Oral Daily   polyethylene glycol  34 g Oral Daily   senna  1 tablet Oral Daily   Continuous Infusions:  cefTRIAXone (ROCEPHIN)  IV Stopped (08/08/23 1031)   PRN Meds:.acetaminophen **OR** acetaminophen, metoprolol tartrate, ondansetron **OR** ondansetron (ZOFRAN) IV, oxyCODONE Allergies  Allergen Reactions   Penicillin V Potassium Other (See Comments)    Has patient had a PCN reaction causing immediate rash, facial/tongue/throat swelling, SOB or lightheadedness with hypotension: Unknown Has patient had a PCN reaction causing severe rash involving mucus membranes or skin necrosis: Unknown Has patient had a PCN reaction that required hospitalization: Unknown Has patient had a PCN reaction occurring within the last 10 years: No If all of the above answers are "NO", then may proceed with Cephalosporin use.    Sulfa Antibiotics Other (See Comments)    headache   Review of Systems  Constitutional:  Positive for activity change, appetite change and fatigue.  Respiratory:  Positive for cough, choking and shortness of breath.   Neurological:  Positive for weakness.    Physical Exam Vitals and nursing note reviewed.  Constitutional:      General: She is not in acute distress.    Appearance: She is ill-appearing.     Comments: Severe kyphosis Frail  Cardiovascular:     Rate and Rhythm: Normal rate.  Pulmonary:     Effort: No tachypnea, accessory muscle usage or respiratory distress.     Comments: Severe cough with copious secretions - struggling to expectorate due to thickness.  Abdominal:     Palpations: Abdomen is soft.  Neurological:     Mental Status: She is alert and oriented to person, place, and time.     Vital Signs: BP (!) 124/54   Pulse 65   Temp (!) 97.5 F (36.4 C)   Resp 20   Ht 5' (1.524  m)   SpO2 100%   BMI 24.80 kg/m  Pain Scale: 0-10   Pain Score: 0-No pain   SpO2: SpO2: 100 % O2 Device:SpO2: 100 % O2 Flow Rate: .O2 Flow Rate (L/min): 3 L/min  IO: Intake/output summary:  Intake/Output Summary (Last 24 hours) at 08/08/2023 1614 Last data filed at 08/08/2023 1129 Gross per 24 hour  Intake 489.6 ml  Output --  Net 489.6 ml    LBM:   Baseline Weight:   Most recent weight:       Palliative Assessment/Data:     Time Total: 90 min  Greater than 50%  of this time was spent counseling and coordinating care related to the above assessment and plan.  Signed by: Yong Channel, NP Palliative Medicine Team Pager # [phone] (M-F 8a-5p) Team Phone # [phone] (Nights/Weekends)

## 2023-08-08 NOTE — ED Notes (Signed)
Pt cleaned, perineal care performed, brief and underpad changed.  Pt was repositioned.  Pt states she is comfortable at this time.

## 2023-08-08 NOTE — Evaluation (Addendum)
Physical Therapy Evaluation Patient Details Name: Olivia Lewis MRN: [id number] DOB: 05-Jun-1929 Today's Date: 08/08/2023  History of Present Illness  89 y/o female presented to ED on 08/06/23 for SOB, productive cough, and confusion. Recently hospitalized 8/15-8/19 with respirator failure 2/2 multiple rib fractures from a fall. Admitted for acute respiratory failure 2/2 bilateral pneumonia. PMH: HFpEF, CKD stage IIIa, hypertension, hyperlipidemia, osteoporosis, hypothyroidism, and frequent falls  Clinical Impression  Patient admitted with the above. PTA, patient was recently at [facility] for rehab and ambulating very short distances with RW per friend in the room. Patient wishes to return to [facility] for further rehab at discharge. Patient presents with weakness, impaired balance, and decreased activity tolerance. Requires modA for bed mobility from elevated stretcher. Patient declined attempt at standing 2/2 elevated stretcher in ED and concerns for getting back onto bed due to her height. Will further assess mobility next session. Patient will benefit from skilled PT services during acute stay to address listed deficits. Patient will benefit from ongoing therapy at discharge to maximize functional independence and safety.          If plan is discharge home, recommend the following: A lot of help with walking and/or transfers;A lot of help with bathing/dressing/bathroom;Assistance with cooking/housework;Direct supervision/assist for medications management;Direct supervision/assist for financial management;Assist for transportation;Help with stairs or ramp for entrance   Can travel by private vehicle        Equipment Recommendations Rolling Grecia Lynk (2 wheels);Hospital bed  Recommendations for Other Services       Functional Status Assessment Patient has had a recent decline in their functional status and demonstrates the ability to make significant improvements in function in a reasonable  and predictable amount of time.     Precautions / Restrictions Precautions Precautions: Fall Precaution Comments: watch O2 Restrictions Weight Bearing Restrictions: No      Mobility  Bed Mobility Overal bed mobility: Needs Assistance Bed Mobility: Supine to Sit, Sit to Supine     Supine to sit: Mod assist Sit to supine: Mod assist   General bed mobility comments: from elevated stretcher, required modA for LE and trunk management.    Transfers                   General transfer comment: Patient declined attempts to stand due to elevated bed height    Ambulation/Gait                  Stairs            Wheelchair Mobility     Tilt Bed    Modified Rankin (Stroke Patients Only)       Balance Overall balance assessment: Needs assistance Sitting-balance support: Bilateral upper extremity supported, Feet unsupported Sitting balance-Leahy Scale: Fair                                       Pertinent Vitals/Pain Pain Assessment Pain Assessment: Faces Faces Pain Scale: Hurts little more Pain Location: generalized Pain Descriptors / Indicators: Discomfort, Grimacing, Guarding, Moaning Pain Intervention(s): Limited activity within patient's tolerance, Monitored during session, Repositioned    Home Living Family/patient expects to be discharged to:: Skilled nursing facility                   Additional Comments: recently at [facility] for rehab, planning to return after hospitalization    Prior Function Prior Level  of Function : Independent/Modified Independent;History of Falls (last six months)             Mobility Comments: Has 24/7 assistance from home aide who assist with Pt care ADLs Comments: Pt notes she was able to complete dressing and sink baths without direct assist prior to fall     Extremity/Trunk Assessment   Upper Extremity Assessment Upper Extremity Assessment: Generalized weakness    Lower  Extremity Assessment Lower Extremity Assessment: Generalized weakness    Cervical / Trunk Assessment Cervical / Trunk Assessment: Kyphotic (severely)  Communication   Communication Communication: Hearing impairment  Cognition Arousal: Alert Behavior During Therapy: WFL for tasks assessed/performed Overall Cognitive Status: Within Functional Limits for tasks assessed                                 General Comments: HOH. Cognition seemed [facility] during conversation. Friend present with no concerns on cognition presently        General Comments General comments (skin integrity, edema, etc.): On 2L, spO2 92%    Exercises     Assessment/Plan    PT Assessment Patient needs continued PT services  PT Problem List Decreased strength;Decreased activity tolerance;Decreased balance;Decreased mobility;Decreased knowledge of use of DME;Decreased safety awareness;Decreased knowledge of precautions;Cardiopulmonary status limiting activity       PT Treatment Interventions DME instruction;Gait training;Therapeutic activities;Functional mobility training;Therapeutic exercise;Balance training;Patient/family education    PT Goals (Current goals can be found in the Care Plan section)  Acute Rehab PT Goals Patient Stated Goal: to go to rehab and then home PT Goal Formulation: With patient Time For Goal Achievement: 08/22/23 Potential to Achieve Goals: Fair    Frequency Min 1X/week     Co-evaluation               AM-PAC PT "6 Clicks" Mobility  Outcome Measure Help needed turning from your back to your side while in a flat bed without using bedrails?: A Lot Help needed moving from lying on your back to sitting on the side of a flat bed without using bedrails?: A Lot Help needed moving to and from a bed to a chair (including a wheelchair)?: Total Help needed standing up from a chair using your arms (e.g., wheelchair or bedside chair)?: Total Help needed to walk in hospital  room?: Total Help needed climbing 3-5 steps with a railing? : Total 6 Click Score: 8    End of Session Equipment Utilized During Treatment: Oxygen Activity Tolerance: Patient tolerated treatment well Patient left: in bed;with call bell/phone within reach;with family/visitor present Nurse Communication: Mobility status PT Visit Diagnosis: Unsteadiness on feet (R26.81);Muscle weakness (generalized) (M62.81);History of falling (Z91.81);Difficulty in walking, not elsewhere classified (R26.2);Other abnormalities of gait and mobility (R26.89)    Time: 1610-9604 PT Time Calculation (min) (ACUTE ONLY): 15 min   Charges:   PT Evaluation $PT Eval Moderate Complexity: 1 Mod   PT General Charges $$ ACUTE PT VISIT: 1 Visit         Maylon Peppers, PT, DPT Physical Therapist - [facility]  [facility]   Deshundra Waller A Markale Birdsell 08/08/2023, 11:23 AM

## 2023-08-08 NOTE — ED Notes (Signed)
Informed RN bed assigned 

## 2023-08-09 DIAGNOSIS — Z7189 Other specified counseling: Secondary | ICD-10-CM | POA: Diagnosis not present

## 2023-08-09 DIAGNOSIS — R1310 Dysphagia, unspecified: Secondary | ICD-10-CM | POA: Diagnosis not present

## 2023-08-09 DIAGNOSIS — J9601 Acute respiratory failure with hypoxia: Secondary | ICD-10-CM | POA: Diagnosis not present

## 2023-08-09 DIAGNOSIS — Z515 Encounter for palliative care: Secondary | ICD-10-CM | POA: Diagnosis not present

## 2023-08-09 LAB — BASIC METABOLIC PANEL
Anion gap: 9 (ref 5–15)
BUN: 20 mg/dL (ref 8–23)
CO2: 28 mmol/L (ref 22–32)
Calcium: 9.1 mg/dL (ref 8.9–10.3)
Chloride: 102 mmol/L (ref 98–111)
Creatinine, Ser: 0.79 mg/dL (ref 0.44–1.00)
GFR, Estimated: >60 mL/min (ref >60)
Glucose, Bld: 80 mg/dL (ref 70–99)
Potassium: 3.3 mmol/L — ABNORMAL LOW (ref 3.5–5.1)
Sodium: 139 mmol/L (ref 135–145)

## 2023-08-09 LAB — CBC
HCT: 43 % (ref 36.0–46.0)
Hemoglobin: 14.1 g/dL (ref 12.0–15.0)
MCH: 29.4 pg (ref 26.0–34.0)
MCHC: 32.8 g/dL (ref 30.0–36.0)
MCV: 89.8 fL (ref 80.0–100.0)
Platelets: 283 10*3/uL (ref 150–400)
RBC: 4.79 MIL/uL (ref 3.87–5.11)
RDW: 15.2 % (ref 11.5–15.5)
WBC: 11.4 10*3/uL — ABNORMAL HIGH (ref 4.0–10.5)
nRBC: 0 % (ref 0.0–0.2)

## 2023-08-09 LAB — CULTURE, RESPIRATORY W GRAM STAIN: Culture: NORMAL

## 2023-08-09 LAB — MAGNESIUM: Magnesium: 2.2 mg/dL (ref 1.7–2.4)

## 2023-08-09 MED ORDER — POTASSIUM CHLORIDE 10 MEQ/100ML IV SOLN
10.0000 meq | Freq: Once | INTRAVENOUS | Status: AC
  Administered 2023-08-09: 10 meq via INTRAVENOUS
  Filled 2023-08-09: qty 100

## 2023-08-09 NOTE — Progress Notes (Signed)
PT Cancellation Note  Patient Details Name: JOCLYN DELION MRN: [id number] DOB: 14-Aug-1929   Cancelled Treatment:    Reason Eval/Treat Not Completed: Patient declined, no reason specified (PT treatment attempted. Patient reports she is "not up for it" and does not feel well today. PT will continue with attempts as patient able to participate.)  Donna Bernard, PT, MPT  Ina Homes 08/09/2023, 1:15 PM

## 2023-08-09 NOTE — Plan of Care (Signed)
?  Problem: Clinical Measurements: ?Goal: Ability to maintain a body temperature in the normal range will improve ?Outcome: Progressing ?  ?Problem: Respiratory: ?Goal: Ability to maintain adequate ventilation will improve ?Outcome: Progressing ?Goal: Ability to maintain a clear airway will improve ?Outcome: Progressing ?  ?Problem: Education: ?Goal: Knowledge of General Education information will improve ?Description: Including pain rating scale, medication(s)/side effects and non-pharmacologic comfort measures ?Outcome: Progressing ?  ?Problem: Health Behavior/Discharge Planning: ?Goal: Ability to manage health-related needs will improve ?Outcome: Progressing ?  ?Problem: Clinical Measurements: ?Goal: Ability to maintain clinical measurements within normal limits will improve ?Outcome: Progressing ?Goal: Will remain free from infection ?Outcome: Progressing ?Goal: Diagnostic test results will improve ?Outcome: Progressing ?Goal: Respiratory complications will improve ?Outcome: Progressing ?Goal: Cardiovascular complication will be avoided ?Outcome: Progressing ?  ?Problem: Coping: ?Goal: Level of anxiety will decrease ?Outcome: Progressing ?  ?Problem: Elimination: ?Goal: Will not experience complications related to bowel motility ?Outcome: Progressing ?Goal: Will not experience complications related to urinary retention ?Outcome: Progressing ?  ?Problem: Pain Managment: ?Goal: General experience of comfort will improve ?Outcome: Progressing ?  ?Problem: Safety: ?Goal: Ability to remain free from injury will improve ?Outcome: Progressing ?  ?Problem: Skin Integrity: ?Goal: Risk for impaired skin integrity will decrease ?Outcome: Progressing ?  ?Problem: Activity: ?Goal: Ability to tolerate increased activity will improve ?Outcome: Not Progressing ?  ?Problem: Activity: ?Goal: Risk for activity intolerance will decrease ?Outcome: Not Progressing ?  ?Problem: Nutrition: ?Goal: Adequate nutrition will be  maintained ?Outcome: Not Progressing ?  ?

## 2023-08-09 NOTE — Progress Notes (Signed)
are noted as well. Increased kyphosis is noted with upper thoracic compression deformity stable from the prior exam. Chronic manubrial and sternal fractures are seen. Review of the MIP images confirms the above findings. IMPRESSION: No evidence of pulmonary emboli. Bilateral lower lobe consolidation. Multiple rib fractures are noted bilaterally stable from the prior exam. Chronic thoracic compression deformities and sternal fractures are again noted. Aortic Atherosclerosis (ICD10-I70.0). Electronically Signed   By: Alcide Clever M.D.   On: 08/06/2023 23:00   DG Chest 1 View  Result Date: 08/06/2023 CLINICAL DATA:  Short of breath, possible aspiration EXAM: CHEST  1 VIEW COMPARISON:  05/18/2023, 07/09/2023 FINDINGS: 2 frontal views of the chest demonstrates stable enlargement of the cardiac silhouette. No acute airspace disease, effusion, or pneumothorax. Displaced left lateral fourth through seventh rib fractures are noted. Multiple prior healed bilateral rib fractures are again noted. Severe degenerative changes of the right shoulder. IMPRESSION: 1. No acute airspace disease. 2. Acute to subacute displaced left lateral fourth through seventh rib fractures. Electronically Signed   By: Sharlet Salina M.D.   On: 08/06/2023 18:37   DG SWALLOW FUNC OP MEDICARE SPEECH PATH  Result Date: 08/03/2023 Table formatting from the original result was not included. Modified Barium Swallow Study Patient Details Name: LILLYTH KUETHE MRN: [id number] Date of Birth: 1929-04-20 Today's Date: 08/03/2023 HPI/PMH: HPI: Pt is a 89 y.o. female with medical history significant of Kyphosis ("sometimes wear a neck brace at home"), Gastric REFLUX, HFpEF, CKD stage III, hypertension, hyperlipidemia, osteoporosis, hypothyroidism, and frequent Falls who presents to the ED on 07/11/2023 s/p Fall, rib fxs. Pt requires support w/ all ADLs at  [facility]. Pt has Severe Kyphotic positioning impacting head up position for oral intake, sitting in chair. Clinical Impression Patient appears to present w/ mild-moderate chronic oropharyngeal phase dysphagia and Esophageal phase dysphagia. Contributing factors impacting swallowing include severe Kyphotic posture and structural age-related changes along w/ advance age(89yo). Oral phase is characterized by slow mastication and A-P transfer -- suspect impact of Gravity from head down/forwad position. Slight-min residue appeared to clear w/ f/u, Dry swallow. Swallow initiation appeared consistent at the level of the Valleculae. Pharyngeal phase is noted for functional base of tongue retraction and peristalsis w/ no pharyngeal residue collection post swallow. OF NOTE, suspected partial PE Segment opening/obstruction(?) appeared to contribute to decreased pharyngeal and PE Segment clearance of bolus material w/ all trials resulting in closure preventing full bolus clearance of the area -- a min-mod amount of the bolus remained and/or returned in the pharynx/Pyriform Sinuses post swallow. No obvious laryngeal penetration nore aspiration occurred d/t the pharyngeal residue, although, pt is at increased risk of such to occur.  Subsequent Dry swallow appeared somewhat effective in reducing/clearing the Pyriform Sinus residue. Many of the other pharyngeal components of swallowing(epiglottic inversion, exursion, BOT retraction) were difficult to assess d/t significantly obscured view d/t the Kyphotic positioning. No obvious aspiration occurred during trials given; possible slight/brief laryngeal penetration during larger sips of thin liquids (via straw) noted but no build-up w/ f/u trial. OF NOTE: pt exhibited Esophageal phase Dysmotility w/ retrograde flow to/through the pyriform sinuses; bolus stasis in mid-Esophagus intermittently.  Due to pt's positioning and discomfort during the exam, further trials were not given and  exam ended.  Pt and Family member were given Education post MBSS. Recommended strategies to include: small, single bites/sips, clear oropharyngnx w/ Dry swallow prior to taking another bite/sip, alternate food/liquid. Head forward posture w/ oral intake. Oral care prior to meals to  are noted as well. Increased kyphosis is noted with upper thoracic compression deformity stable from the prior exam. Chronic manubrial and sternal fractures are seen. Review of the MIP images confirms the above findings. IMPRESSION: No evidence of pulmonary emboli. Bilateral lower lobe consolidation. Multiple rib fractures are noted bilaterally stable from the prior exam. Chronic thoracic compression deformities and sternal fractures are again noted. Aortic Atherosclerosis (ICD10-I70.0). Electronically Signed   By: Alcide Clever M.D.   On: 08/06/2023 23:00   DG Chest 1 View  Result Date: 08/06/2023 CLINICAL DATA:  Short of breath, possible aspiration EXAM: CHEST  1 VIEW COMPARISON:  05/18/2023, 07/09/2023 FINDINGS: 2 frontal views of the chest demonstrates stable enlargement of the cardiac silhouette. No acute airspace disease, effusion, or pneumothorax. Displaced left lateral fourth through seventh rib fractures are noted. Multiple prior healed bilateral rib fractures are again noted. Severe degenerative changes of the right shoulder. IMPRESSION: 1. No acute airspace disease. 2. Acute to subacute displaced left lateral fourth through seventh rib fractures. Electronically Signed   By: Sharlet Salina M.D.   On: 08/06/2023 18:37   DG SWALLOW FUNC OP MEDICARE SPEECH PATH  Result Date: 08/03/2023 Table formatting from the original result was not included. Modified Barium Swallow Study Patient Details Name: LILLYTH KUETHE MRN: [id number] Date of Birth: 1929-04-20 Today's Date: 08/03/2023 HPI/PMH: HPI: Pt is a 89 y.o. female with medical history significant of Kyphosis ("sometimes wear a neck brace at home"), Gastric REFLUX, HFpEF, CKD stage III, hypertension, hyperlipidemia, osteoporosis, hypothyroidism, and frequent Falls who presents to the ED on 07/11/2023 s/p Fall, rib fxs. Pt requires support w/ all ADLs at  [facility]. Pt has Severe Kyphotic positioning impacting head up position for oral intake, sitting in chair. Clinical Impression Patient appears to present w/ mild-moderate chronic oropharyngeal phase dysphagia and Esophageal phase dysphagia. Contributing factors impacting swallowing include severe Kyphotic posture and structural age-related changes along w/ advance age(89yo). Oral phase is characterized by slow mastication and A-P transfer -- suspect impact of Gravity from head down/forwad position. Slight-min residue appeared to clear w/ f/u, Dry swallow. Swallow initiation appeared consistent at the level of the Valleculae. Pharyngeal phase is noted for functional base of tongue retraction and peristalsis w/ no pharyngeal residue collection post swallow. OF NOTE, suspected partial PE Segment opening/obstruction(?) appeared to contribute to decreased pharyngeal and PE Segment clearance of bolus material w/ all trials resulting in closure preventing full bolus clearance of the area -- a min-mod amount of the bolus remained and/or returned in the pharynx/Pyriform Sinuses post swallow. No obvious laryngeal penetration nore aspiration occurred d/t the pharyngeal residue, although, pt is at increased risk of such to occur.  Subsequent Dry swallow appeared somewhat effective in reducing/clearing the Pyriform Sinus residue. Many of the other pharyngeal components of swallowing(epiglottic inversion, exursion, BOT retraction) were difficult to assess d/t significantly obscured view d/t the Kyphotic positioning. No obvious aspiration occurred during trials given; possible slight/brief laryngeal penetration during larger sips of thin liquids (via straw) noted but no build-up w/ f/u trial. OF NOTE: pt exhibited Esophageal phase Dysmotility w/ retrograde flow to/through the pyriform sinuses; bolus stasis in mid-Esophagus intermittently.  Due to pt's positioning and discomfort during the exam, further trials were not given and  exam ended.  Pt and Family member were given Education post MBSS. Recommended strategies to include: small, single bites/sips, clear oropharyngnx w/ Dry swallow prior to taking another bite/sip, alternate food/liquid. Head forward posture w/ oral intake. Oral care prior to meals to  [facility] Cardiology  CARDIOLOGY PROGRESS NOTE  Patient ID: BRYNLEIGH FURBUSH MRN: [id number] DOB/AGE: 26-Sep-1929 89 y.o.  Admit date: 08/06/2023 Referring Physician Para March Primary Physician [facility] Primary Cardiologist [facility] Reason for Consultation atrial fibrillation  HPI: 89 year old female referred for evaluation of atrial fibrillation with rapid ventricular rate.  Patient recently hospitalized 07/07/2023 - 07/11/2023 with respiratory failure and multiple rib fractures after falling.  The patient had abnormal barium swallow on 08/03/2023 and has been residing at [facility].  Friends noted that the patient experienced a productive cough and confusion the patient was brought to [facility] ED where she was noted to be hypoxic.  White count was 18,000.  CT was negative for PE but showed bilateral lower lobe consolidation.  Patient admitted for bilateral pneumonia, possible aspiration.  ECG revealed atrial fibrillation at 133 bpm.  Patient has a history of paroxysmal atrial fibrillation, on metoprolol tartrate for rate and rhythm control.  The patient is not anticoagulated due to frequent falls.  The patient was started on amiodarone drip, and converted to sinus rhythm at 60 bpm.  Interval history:  -Patient reports "feeling about the same" today.  -SOB is stable, remains on supplemental oxygen. -Denies any chest pain or palpitations. Remains in NSR on tele.   Review of systems complete and found to be negative unless listed above     Past Medical History:  Diagnosis Date   Allergy    Anemia    Arthritis    B12 deficiency    Basal cell carcinoma    face, R arm, txted by Dr. Orson Aloe and [facility]   Cancer [facility])    skin--face and arms   Cataracts, bilateral    CHF (congestive heart failure) (HCC)    Chickenpox    CKD (chronic kidney disease), stage III (HCC)    Diverticulosis    GERD (gastroesophageal reflux disease)    Hyperlipidemia    Hypertension    Hypothyroidism    Osteoporosis     Squamous cell carcinoma of skin 05/01/2021   right distal tricep at elbow, left forearm near elbow, right dorsum hand, left mid lat forearm - all treated with EDC   Squamous cell carcinoma of skin 05/01/2021   right calf - EDC 07/14/21   Squamous cell carcinoma of skin 08/18/2021   R sup calf - ED&C   Squamous cell carcinoma of skin 12/29/2021   L mandible - ED&C   Squamous cell carcinoma of skin 12/29/2021   L lat elbow - ED&C   Squamous cell carcinoma of skin 12/29/2021   L lat tricep - ED&C   Squamous cell carcinoma of skin 05/11/2022   Right deltoid - EDC   Squamous cell carcinoma of skin 05/11/2022   Left forearm - in situ - EDC    Past Surgical History:  Procedure Laterality Date   BUNIONECTOMY Bilateral    CARDIAC CATHETERIZATION     CATARACT EXTRACTION Bilateral    DILATION AND CURETTAGE OF UTERUS     EYE SURGERY Bilateral    cataract  surgery   JOINT REPLACEMENT Right    total knee replacement   meniscus tear Right    multiple skin cancer removal     NEUROMA SURGERY Bilateral    bilateral feet   rotator cuff surgery Right    TOTAL KNEE ARTHROPLASTY Left 05/06/2015   Procedure: TOTAL KNEE ARTHROPLASTY;  Surgeon: Kennedy Bucker, MD;  Location: [facility] ORS;  Service: Orthopedics;  Laterality: Left;    Medications Prior to Admission  Medication Sig  [facility] Card[facility]  CARDIOLOGY PROGRESS NOTE  Patient ID: BRYNLEIGH FURBUSH MRN: [id number] DOB/AGE: 26-Sep-1929 89 y.o.  Admit date: 08/06/2023 Referring Physician Para March Primary Physician [facility] Primary Cardiologist [facility] Reason for Consultation atrial fibrillation  HPI: 89 year old female referred for evaluation of atrial fibrillation with rapid ventricular rate.  Patient recently hospitalized 07/07/2023 - 07/11/2023 with respiratory failure and multiple rib fractures after falling.  The patient had abnormal barium swallow on 08/03/2023 and has been residing at [facility].  Friends noted that the patient experienced a productive cough and confusion the patient was brought to [facility] ED where she was noted to be hypoxic.  White count was 18,000.  CT was negative for PE but showed bilateral lower lobe consolidation.  Patient admitted for bilateral pneumonia, possible aspiration.  ECG revealed atrial fibrillation at 133 bpm.  Patient has a history of paroxysmal atrial fibrillation, on metoprolol tartrate for rate and rhythm control.  The patient is not anticoagulated due to frequent falls.  The patient was started on amiodarone drip, and converted to sinus rhythm at 60 bpm.  Interval history:  -Patient reports "feeling about the same" today.  -SOB is stable, remains on supplemental oxygen. -Denies any chest pain or palpitations. Remains in NSR on tele.   Review of systems complete and found to be negative unless listed above     Past Medical History:  Diagnosis Date   Allergy    Anemia    Arthritis    B12 deficiency    Basal cell carcinoma    face, R arm, txted by Dr. Orson Aloe and [facility]   Cancer [facility])    skin--face and arms   Cataracts, bilateral    CHF (congestive heart failure) (HCC)    Chickenpox    CKD (chronic kidney disease), stage III (HCC)    Diverticulosis    GERD (gastroesophageal reflux disease)    Hyperlipidemia    Hypertension    Hypothyroidism    Osteoporosis     Squamous cell carcinoma of skin 05/01/2021   right distal tricep at elbow, left forearm near elbow, right dorsum hand, left mid lat forearm - all treated with EDC   Squamous cell carcinoma of skin 05/01/2021   right calf - EDC 07/14/21   Squamous cell carcinoma of skin 08/18/2021   R sup calf - ED&C   Squamous cell carcinoma of skin 12/29/2021   L mandible - ED&C   Squamous cell carcinoma of skin 12/29/2021   L lat elbow - ED&C   Squamous cell carcinoma of skin 12/29/2021   L lat tricep - ED&C   Squamous cell carcinoma of skin 05/11/2022   Right deltoid - EDC   Squamous cell carcinoma of skin 05/11/2022   Left forearm - in situ - EDC    Past Surgical History:  Procedure Laterality Date   BUNIONECTOMY Bilateral    CARDIAC CATHETERIZATION     CATARACT EXTRACTION Bilateral    DILATION AND CURETTAGE OF UTERUS     EYE SURGERY Bilateral    cataract  surgery   JOINT REPLACEMENT Right    total knee replacement   meniscus tear Right    multiple skin cancer removal     NEUROMA SURGERY Bilateral    bilateral feet   rotator cuff surgery Right    TOTAL KNEE ARTHROPLASTY Left 05/06/2015   Procedure: TOTAL KNEE ARTHROPLASTY;  Surgeon: Kennedy Bucker, MD;  Location: [facility] ORS;  Service: Orthopedics;  Laterality: Left;    Medications Prior to Admission  Medication Sig  are noted as well. Increased kyphosis is noted with upper thoracic compression deformity stable from the prior exam. Chronic manubrial and sternal fractures are seen. Review of the MIP images confirms the above findings. IMPRESSION: No evidence of pulmonary emboli. Bilateral lower lobe consolidation. Multiple rib fractures are noted bilaterally stable from the prior exam. Chronic thoracic compression deformities and sternal fractures are again noted. Aortic Atherosclerosis (ICD10-I70.0). Electronically Signed   By: Alcide Clever M.D.   On: 08/06/2023 23:00   DG Chest 1 View  Result Date: 08/06/2023 CLINICAL DATA:  Short of breath, possible aspiration EXAM: CHEST  1 VIEW COMPARISON:  05/18/2023, 07/09/2023 FINDINGS: 2 frontal views of the chest demonstrates stable enlargement of the cardiac silhouette. No acute airspace disease, effusion, or pneumothorax. Displaced left lateral fourth through seventh rib fractures are noted. Multiple prior healed bilateral rib fractures are again noted. Severe degenerative changes of the right shoulder. IMPRESSION: 1. No acute airspace disease. 2. Acute to subacute displaced left lateral fourth through seventh rib fractures. Electronically Signed   By: Sharlet Salina M.D.   On: 08/06/2023 18:37   DG SWALLOW FUNC OP MEDICARE SPEECH PATH  Result Date: 08/03/2023 Table formatting from the original result was not included. Modified Barium Swallow Study Patient Details Name: LILLYTH KUETHE MRN: [id number] Date of Birth: 1929-04-20 Today's Date: 08/03/2023 HPI/PMH: HPI: Pt is a 89 y.o. female with medical history significant of Kyphosis ("sometimes wear a neck brace at home"), Gastric REFLUX, HFpEF, CKD stage III, hypertension, hyperlipidemia, osteoporosis, hypothyroidism, and frequent Falls who presents to the ED on 07/11/2023 s/p Fall, rib fxs. Pt requires support w/ all ADLs at  [facility]. Pt has Severe Kyphotic positioning impacting head up position for oral intake, sitting in chair. Clinical Impression Patient appears to present w/ mild-moderate chronic oropharyngeal phase dysphagia and Esophageal phase dysphagia. Contributing factors impacting swallowing include severe Kyphotic posture and structural age-related changes along w/ advance age(89yo). Oral phase is characterized by slow mastication and A-P transfer -- suspect impact of Gravity from head down/forwad position. Slight-min residue appeared to clear w/ f/u, Dry swallow. Swallow initiation appeared consistent at the level of the Valleculae. Pharyngeal phase is noted for functional base of tongue retraction and peristalsis w/ no pharyngeal residue collection post swallow. OF NOTE, suspected partial PE Segment opening/obstruction(?) appeared to contribute to decreased pharyngeal and PE Segment clearance of bolus material w/ all trials resulting in closure preventing full bolus clearance of the area -- a min-mod amount of the bolus remained and/or returned in the pharynx/Pyriform Sinuses post swallow. No obvious laryngeal penetration nore aspiration occurred d/t the pharyngeal residue, although, pt is at increased risk of such to occur.  Subsequent Dry swallow appeared somewhat effective in reducing/clearing the Pyriform Sinus residue. Many of the other pharyngeal components of swallowing(epiglottic inversion, exursion, BOT retraction) were difficult to assess d/t significantly obscured view d/t the Kyphotic positioning. No obvious aspiration occurred during trials given; possible slight/brief laryngeal penetration during larger sips of thin liquids (via straw) noted but no build-up w/ f/u trial. OF NOTE: pt exhibited Esophageal phase Dysmotility w/ retrograde flow to/through the pyriform sinuses; bolus stasis in mid-Esophagus intermittently.  Due to pt's positioning and discomfort during the exam, further trials were not given and  exam ended.  Pt and Family member were given Education post MBSS. Recommended strategies to include: small, single bites/sips, clear oropharyngnx w/ Dry swallow prior to taking another bite/sip, alternate food/liquid. Head forward posture w/ oral intake. Oral care prior to meals to  are noted as well. Increased kyphosis is noted with upper thoracic compression deformity stable from the prior exam. Chronic manubrial and sternal fractures are seen. Review of the MIP images confirms the above findings. IMPRESSION: No evidence of pulmonary emboli. Bilateral lower lobe consolidation. Multiple rib fractures are noted bilaterally stable from the prior exam. Chronic thoracic compression deformities and sternal fractures are again noted. Aortic Atherosclerosis (ICD10-I70.0). Electronically Signed   By: Alcide Clever M.D.   On: 08/06/2023 23:00   DG Chest 1 View  Result Date: 08/06/2023 CLINICAL DATA:  Short of breath, possible aspiration EXAM: CHEST  1 VIEW COMPARISON:  05/18/2023, 07/09/2023 FINDINGS: 2 frontal views of the chest demonstrates stable enlargement of the cardiac silhouette. No acute airspace disease, effusion, or pneumothorax. Displaced left lateral fourth through seventh rib fractures are noted. Multiple prior healed bilateral rib fractures are again noted. Severe degenerative changes of the right shoulder. IMPRESSION: 1. No acute airspace disease. 2. Acute to subacute displaced left lateral fourth through seventh rib fractures. Electronically Signed   By: Sharlet Salina M.D.   On: 08/06/2023 18:37   DG SWALLOW FUNC OP MEDICARE SPEECH PATH  Result Date: 08/03/2023 Table formatting from the original result was not included. Modified Barium Swallow Study Patient Details Name: LILLYTH KUETHE MRN: [id number] Date of Birth: 1929-04-20 Today's Date: 08/03/2023 HPI/PMH: HPI: Pt is a 89 y.o. female with medical history significant of Kyphosis ("sometimes wear a neck brace at home"), Gastric REFLUX, HFpEF, CKD stage III, hypertension, hyperlipidemia, osteoporosis, hypothyroidism, and frequent Falls who presents to the ED on 07/11/2023 s/p Fall, rib fxs. Pt requires support w/ all ADLs at  [facility]. Pt has Severe Kyphotic positioning impacting head up position for oral intake, sitting in chair. Clinical Impression Patient appears to present w/ mild-moderate chronic oropharyngeal phase dysphagia and Esophageal phase dysphagia. Contributing factors impacting swallowing include severe Kyphotic posture and structural age-related changes along w/ advance age(89yo). Oral phase is characterized by slow mastication and A-P transfer -- suspect impact of Gravity from head down/forwad position. Slight-min residue appeared to clear w/ f/u, Dry swallow. Swallow initiation appeared consistent at the level of the Valleculae. Pharyngeal phase is noted for functional base of tongue retraction and peristalsis w/ no pharyngeal residue collection post swallow. OF NOTE, suspected partial PE Segment opening/obstruction(?) appeared to contribute to decreased pharyngeal and PE Segment clearance of bolus material w/ all trials resulting in closure preventing full bolus clearance of the area -- a min-mod amount of the bolus remained and/or returned in the pharynx/Pyriform Sinuses post swallow. No obvious laryngeal penetration nore aspiration occurred d/t the pharyngeal residue, although, pt is at increased risk of such to occur.  Subsequent Dry swallow appeared somewhat effective in reducing/clearing the Pyriform Sinus residue. Many of the other pharyngeal components of swallowing(epiglottic inversion, exursion, BOT retraction) were difficult to assess d/t significantly obscured view d/t the Kyphotic positioning. No obvious aspiration occurred during trials given; possible slight/brief laryngeal penetration during larger sips of thin liquids (via straw) noted but no build-up w/ f/u trial. OF NOTE: pt exhibited Esophageal phase Dysmotility w/ retrograde flow to/through the pyriform sinuses; bolus stasis in mid-Esophagus intermittently.  Due to pt's positioning and discomfort during the exam, further trials were not given and  exam ended.  Pt and Family member were given Education post MBSS. Recommended strategies to include: small, single bites/sips, clear oropharyngnx w/ Dry swallow prior to taking another bite/sip, alternate food/liquid. Head forward posture w/ oral intake. Oral care prior to meals to  are noted as well. Increased kyphosis is noted with upper thoracic compression deformity stable from the prior exam. Chronic manubrial and sternal fractures are seen. Review of the MIP images confirms the above findings. IMPRESSION: No evidence of pulmonary emboli. Bilateral lower lobe consolidation. Multiple rib fractures are noted bilaterally stable from the prior exam. Chronic thoracic compression deformities and sternal fractures are again noted. Aortic Atherosclerosis (ICD10-I70.0). Electronically Signed   By: Alcide Clever M.D.   On: 08/06/2023 23:00   DG Chest 1 View  Result Date: 08/06/2023 CLINICAL DATA:  Short of breath, possible aspiration EXAM: CHEST  1 VIEW COMPARISON:  05/18/2023, 07/09/2023 FINDINGS: 2 frontal views of the chest demonstrates stable enlargement of the cardiac silhouette. No acute airspace disease, effusion, or pneumothorax. Displaced left lateral fourth through seventh rib fractures are noted. Multiple prior healed bilateral rib fractures are again noted. Severe degenerative changes of the right shoulder. IMPRESSION: 1. No acute airspace disease. 2. Acute to subacute displaced left lateral fourth through seventh rib fractures. Electronically Signed   By: Sharlet Salina M.D.   On: 08/06/2023 18:37   DG SWALLOW FUNC OP MEDICARE SPEECH PATH  Result Date: 08/03/2023 Table formatting from the original result was not included. Modified Barium Swallow Study Patient Details Name: LILLYTH KUETHE MRN: [id number] Date of Birth: 1929-04-20 Today's Date: 08/03/2023 HPI/PMH: HPI: Pt is a 89 y.o. female with medical history significant of Kyphosis ("sometimes wear a neck brace at home"), Gastric REFLUX, HFpEF, CKD stage III, hypertension, hyperlipidemia, osteoporosis, hypothyroidism, and frequent Falls who presents to the ED on 07/11/2023 s/p Fall, rib fxs. Pt requires support w/ all ADLs at  [facility]. Pt has Severe Kyphotic positioning impacting head up position for oral intake, sitting in chair. Clinical Impression Patient appears to present w/ mild-moderate chronic oropharyngeal phase dysphagia and Esophageal phase dysphagia. Contributing factors impacting swallowing include severe Kyphotic posture and structural age-related changes along w/ advance age(89yo). Oral phase is characterized by slow mastication and A-P transfer -- suspect impact of Gravity from head down/forwad position. Slight-min residue appeared to clear w/ f/u, Dry swallow. Swallow initiation appeared consistent at the level of the Valleculae. Pharyngeal phase is noted for functional base of tongue retraction and peristalsis w/ no pharyngeal residue collection post swallow. OF NOTE, suspected partial PE Segment opening/obstruction(?) appeared to contribute to decreased pharyngeal and PE Segment clearance of bolus material w/ all trials resulting in closure preventing full bolus clearance of the area -- a min-mod amount of the bolus remained and/or returned in the pharynx/Pyriform Sinuses post swallow. No obvious laryngeal penetration nore aspiration occurred d/t the pharyngeal residue, although, pt is at increased risk of such to occur.  Subsequent Dry swallow appeared somewhat effective in reducing/clearing the Pyriform Sinus residue. Many of the other pharyngeal components of swallowing(epiglottic inversion, exursion, BOT retraction) were difficult to assess d/t significantly obscured view d/t the Kyphotic positioning. No obvious aspiration occurred during trials given; possible slight/brief laryngeal penetration during larger sips of thin liquids (via straw) noted but no build-up w/ f/u trial. OF NOTE: pt exhibited Esophageal phase Dysmotility w/ retrograde flow to/through the pyriform sinuses; bolus stasis in mid-Esophagus intermittently.  Due to pt's positioning and discomfort during the exam, further trials were not given and  exam ended.  Pt and Family member were given Education post MBSS. Recommended strategies to include: small, single bites/sips, clear oropharyngnx w/ Dry swallow prior to taking another bite/sip, alternate food/liquid. Head forward posture w/ oral intake. Oral care prior to meals to

## 2023-08-09 NOTE — Progress Notes (Signed)
Palliative:  HPI: 89 y.o. female  with past medical history of dysphagia, HFpEF, AF RVR, CKD stage 3a, HTN, HLD, osteoporosis, hypothyroidism, monoclonal B-cell lymphocytosis, frequent falls, recent hospitalization due to fall with rib fractures admitted on 08/06/2023 from [facility] with shortness of breath and productive cough worsening found to have bilateral pneumonia.   I met today with Olivia Lewis along with friend, Olivia Lewis, at bedside. Olivia Lewis continues with poor intake due to severe dysphagia. She has not really been able to eat anything today. Olivia Lewis and I have an honest conversation about the severity of her condition and poor prognosis. Olivia Lewis has come to terms and understands that her time is likely very limited. She has made her peace with poor prognosis. She tells me that she knows she does not want to live like this. Her main goal is to get everything set for the trust for her sister so funds are available to ensure her care is continued at her facility. Olivia Lewis shares that she would not want to be rehospitalized but would desire comfort care. Olivia Lewis is open to hospice options but needs to settle her estate and plans for her sister first.   I met on my way out with friend and surrogate decision maker, Olivia Lewis"). I encouraged Norwood Lewis to help ensure that attorney visits to complete arrangements for Deem and her sister sooner than later. Norwood Lewis knows that Harlyn has come to terms with her poor prognosis. Plans for attorney visit at the hospital tomorrow.   All questions/concerns addressed. Emotional support provided.   Exam: Alert, oriented. Severe kyphosis. Severe dysphagia and copious secretions. Breathing regular, unlabored. Abd soft. Weak, frail.   Plan: - DNR - No feeding tube - Open to hospice but needs to settle her estate as this is her priority to ensure her sister is cared for  50 min  Yong Channel, NP Palliative Medicine Team Pager [phone] (Please see [email or web address] for  schedule) Team Phone [phone]    Greater than 50%  of this time was spent counseling and coordinating care related to the above assessment and plan

## 2023-08-09 NOTE — Progress Notes (Addendum)
PROGRESS NOTE    Olivia Lewis  ZOX:[id number] DOB: 1929-03-30 DOA: 08/06/2023 PCP: Gracelyn Nurse, MD  236A/236A-AA  LOS: 3 days   Brief hospital course:   Assessment & Plan: Olivia Lewis is a 89 y.o. female with medical history significant for HFpEF, CKD stage IIIa, hypertension, hyperlipidemia, osteoporosis, hypothyroidism, and frequent falls, recently hospitalized from 8/15 to 8/19 with respiratory failure secondary to multiple rib fractures from a fall, as well as chronic dysphagia s/p abnormal barium swallow on 9/11 who was sent from [facility] for evaluation of shortness of breath, productive cough and confusion that was noticed when her time church friends visited her today.  History is given by  friends at bedside, [name] and [name] who stated that they saw the patient on the day prior and she had a cough and was mildly confused but on the day of arrival she was much more confused.  They state that at baseline patient is very sharp and with it and manages her own affairs and is the power of attorney of her sister who is on the memory care unit.  On arrival of EMS she was reportedly saturating in the 60s on room air and was placed on oxygen, requiring up to 6 L to maintain sats in the 90s.    Bilateral pneumonia Acute on chronic respiratory failure with hypoxia On chronic 2L O2 History of multiple rib fractures 07/07/2023 Possible ongoing aspiration (abnormal barium swallow 9/11), complicated by recent rib fractures O2 sat in the 60s with EMS requiring 6 L to maintain sats in the 90s, now titrated down to 2 L Plan: --cont ceftriaxone and doxy --cont Mucomyst neb BID --IS --Continue supplemental O2 to keep sats >=92%, wean as tolerated  Dysphagia --pt has trouble swallowing solids.  RN reported difficulty even with apple sauce. --SLP eval, palliative care consulted, pt would not want feeding tube --Dys 1 with nectar thick, however, pt still doesn't tolerate  Afib w  RVR History of paroxysmal atrial fibrillation  --hypotension following IV dilt.  Started on IV amio.  Cardio consulted. --on metop PTA, not on anticoagulation due to frequent falls Plan: --cont oral amio if able --cont home Lopressor  Frequent falls Patient has a history of recurrent falls that are often mechanical in nature.  Until her last fall in 07/07/23 lived at home independently, now at [facility] since 07/11/2023 Fall precautions --PT  Acute metabolic encephalopathy Patient was reported to be confused which is not her baseline.  Likely due to respiratory failure --mental status improved and back to baseline next day  Chronic kidney disease, stage 3a (HCC) Renal function currently at baseline. Intermittent monitoring  Monoclonal B-cell lymphocytosis of undetermined significance Follows with heme-onc  Severe sepsis due to pneumonia    DVT prophylaxis: Lovenox SQ Code Status: DNR  Family Communication: friend updated at bedside today Level of care: Progressive Dispo:   The patient is from: SNF rehab Anticipated d/c is to: to be determined Anticipated d/c date is: to be determined   Subjective and Interval History:  Pt unable to swallow anything now, including pills.   Objective: Vitals:   08/09/23 0855 08/09/23 1110 08/09/23 1626 08/09/23 1938  BP: 129/68 107/81 118/66 120/71  Pulse: (!) 59 60 66 60  Resp: 20 20 20 18   Temp: 97.8 F (36.6 C) 98 F (36.7 C) 98 F (36.7 C) 97.9 F (36.6 C)  TempSrc: Oral Oral Oral Oral  SpO2: 97% 100% 100% 96%  Height:  Intake/Output Summary (Last 24 hours) at 08/09/2023 1950 Last data filed at 08/09/2023 1800 Gross per 24 hour  Intake 100 ml  Output 300 ml  Net -200 ml   There were no vitals filed for this visit.  Examination:   Constitutional: NAD, AAOx3 HEENT: conjunctivae and lids normal, EOMI CV: No cyanosis.   RESP: normal respiratory effort, on 3L SKIN: warm, dry, extensive bruising Psych: Normal  mood and affect.  Appropriate judgement and reason   Data Reviewed: I have personally reviewed labs and imaging studies  Time spent: 35 minutes  Darlin Priestly, MD [facility] If 7PM-7AM, please contact night-coverage 08/09/2023, 7:50 PM

## 2023-08-09 NOTE — Care Management Important Message (Signed)
Important Message  Patient Details  Name: MINAAL BOMMER MRN: [id number] Date of Birth: 19-Aug-1929   Medicare Important Message Given:  Yes     Johnell Comings 08/09/2023, 10:05 AM

## 2023-08-10 DIAGNOSIS — J9601 Acute respiratory failure with hypoxia: Secondary | ICD-10-CM | POA: Diagnosis not present

## 2023-08-10 LAB — GLUCOSE, CAPILLARY
Glucose-Capillary: 78 mg/dL (ref 70–99)
Glucose-Capillary: 87 mg/dL (ref 70–99)

## 2023-08-10 MED ORDER — METOPROLOL TARTRATE 5 MG/5ML IV SOLN
2.5000 mg | Freq: Once | INTRAVENOUS | Status: AC
  Administered 2023-08-10: 2.5 mg via INTRAVENOUS
  Filled 2023-08-10: qty 5

## 2023-08-10 MED ORDER — AMIODARONE HCL IN DEXTROSE 360-4.14 MG/200ML-% IV SOLN
30.0000 mg/h | INTRAVENOUS | Status: DC
  Administered 2023-08-10 – 2023-08-12 (×5): 30 mg/h via INTRAVENOUS
  Filled 2023-08-10 (×3): qty 200

## 2023-08-10 MED ORDER — AMIODARONE HCL IN DEXTROSE 360-4.14 MG/200ML-% IV SOLN
60.0000 mg/h | INTRAVENOUS | Status: AC
  Administered 2023-08-10 (×2): 60 mg/h via INTRAVENOUS
  Filled 2023-08-10 (×2): qty 200

## 2023-08-10 MED ORDER — AMIODARONE LOAD VIA INFUSION
150.0000 mg | Freq: Once | INTRAVENOUS | Status: AC
  Administered 2023-08-10: 150 mg via INTRAVENOUS
  Filled 2023-08-10: qty 83.34

## 2023-08-10 MED ORDER — SODIUM CHLORIDE 0.9 % IV BOLUS
250.0000 mL | Freq: Once | INTRAVENOUS | Status: AC
  Administered 2023-08-10: 250 mL via INTRAVENOUS

## 2023-08-10 NOTE — Progress Notes (Addendum)
SLP Cancellation Note  Patient Details Name: Olivia Lewis MRN: [id number] DOB: 26-Sep-1929   Cancelled treatment:       Reason Eval/Treat Not Completed:  (chart revieewd; consulted MD and Palliative Care re: pt's status today.)  Pt continues to have intermittent difficulty swallowing the rec'd dysphagia level 1 (puree) diet w/ Nectar liquids despite strategies/precautions. Palliative Care is following. Pt declines alternative means of feeding and is at peace w/ her decisions. She has not been able to eat/swallow much. Pt is open to Hospice services per Palliative Care NP after settling her affairs. Noted change in status today of Afib with RVR with HR in 140s and restarted on IV amiodarone, per chart notes. ST services can be available if needed for any further needs/education; MD to reconsult.     Jerilynn Som, MS, CCC-SLP Speech Language Pathologist Rehab Services; [facility] [phone] (ascom) Maelie Chriswell 08/10/2023, 5:45 PM

## 2023-08-10 NOTE — Progress Notes (Signed)
PROGRESS NOTE    BELIZE KRANING  ZOX:[id number] DOB: [birth year] DOA: 08/06/2023 PCP: Gracelyn Nurse, MD  [id number]-AA  LOS: 4 days   Brief hospital course:   Assessment & Plan: Olivia Lewis is a 89 y.o. female with medical history significant for HFpEF, CKD stage IIIa, hypertension, hyperlipidemia, osteoporosis, hypothyroidism, and frequent falls, recently hospitalized from 8/15 to 8/19 with respiratory failure secondary to multiple rib fractures from a fall, as well as chronic dysphagia s/p abnormal barium swallow on 9/11 who was sent from [facility] for evaluation of shortness of breath, productive cough and confusion that was noticed when her time church friends visited her today.  History is given by  friends at bedside, [name] and [name] who stated that they saw the patient on the day prior and she had a cough and was mildly confused but on the day of arrival she was much more confused.  They state that at baseline patient is very sharp and with it and manages her own affairs and is the power of attorney of her sister who is on the memory care unit.  On arrival of EMS she was reportedly saturating in the 60s on room air and was placed on oxygen, requiring up to 6 L to maintain sats in the 90s.    Bilateral pneumonia Acute on chronic respiratory failure with hypoxia On chronic 2L O2 History of multiple rib fractures 07/07/2023 Possible ongoing aspiration (abnormal barium swallow 9/11), complicated by recent rib fractures O2 sat in the 60s with EMS requiring 6 L to maintain sats in the 90s, now titrated down to 2 L Plan: --cont ceftriaxone --cont Mucomyst neb BID --IS --Continue supplemental O2 to keep sats >=92%, wean as tolerated  Dysphagia --pt has trouble swallowing solids.  RN reported difficulty even with apple sauce. --SLP eval, palliative care consulted, pt would not want feeding tube --Dys 1 with nectar thick, however, pt still doesn't tolerate --palliative care  consulted  Afib w RVR History of paroxysmal atrial fibrillation  --hypotension following IV dilt.  Started on IV amio.  Cardio consulted. --on metop PTA, not on anticoagulation due to frequent falls --could not take oral amio, so pt relapsed into Afib w RVR Plan: --resume amio gtt  Frequent falls Patient has a history of recurrent falls that are often mechanical in nature.  Until her last fall in 07/07/23 lived at home independently, now at [facility] since 07/11/2023 Fall precautions --PT  Acute metabolic encephalopathy Patient was reported to be confused which is not her baseline.  Likely due to respiratory failure --mental status improved and back to baseline next day  Chronic kidney disease, stage 3a (HCC) Renal function currently at baseline. Intermittent monitoring  Monoclonal B-cell lymphocytosis of undetermined significance Follows with heme-onc  Severe sepsis due to pneumonia    DVT prophylaxis: Lovenox SQ Code Status: DNR  Family Communication: friends updated at bedside today Level of care: Progressive Dispo:   The patient is from: SNF rehab Anticipated d/c is to: to be determined Anticipated d/c date is: to be determined   Subjective and Interval History:  Pt reported feeling better, but still couldn't swallow.    Pt relapsed into Afib w RVR, and amio gtt resumed.   Objective: Vitals:   08/10/23 1400 08/10/23 1500 08/10/23 1557 08/10/23 1600  BP: 113/87 (!) 129/96  112/70  Pulse:   (!) 118   Resp: (!) 25 (!) 29  (!) 24  Temp:   98.2 F (36.8 C)  TempSrc:   Oral   SpO2:   98%   Height:        Intake/Output Summary (Last 24 hours) at 08/10/2023 1746 Last data filed at 08/10/2023 1140 Gross per 24 hour  Intake 100 ml  Output 200 ml  Net -100 ml   There were no vitals filed for this visit.  Examination:   Constitutional: NAD, AAOx3 HEENT: conjunctivae and lids normal, EOMI, soft collar present CV: No cyanosis.   RESP: normal respiratory  effort Neuro: II - XII grossly intact.   Psych: Normal mood and affect.  Appropriate judgement and reason   Data Reviewed: I have personally reviewed labs and imaging studies  Time spent: 35 minutes  Darlin Priestly, MD [facility] If 7PM-7AM, please contact night-coverage 08/10/2023, 5:46 PM

## 2023-08-10 NOTE — Progress Notes (Signed)
PT Cancellation Note  Patient Details Name: Olivia Lewis MRN: [id number] DOB: [birth year]   Cancelled Treatment:    Reason Eval/Treat Not Completed: Medical issues which prohibited therapy Per PA in room, patient with recent switch to Afib with RVR with HR in 140s. Restarted on IV amiodarone. PA requesting to hold PT at this time. Will re-attempt at later date/time as appropriate.   Maylon Peppers, PT, DPT Physical Therapist - [facility]  [facility]    Trulee Hamstra A Darianne Muralles 08/10/2023, 10:43 AM

## 2023-08-10 NOTE — Progress Notes (Signed)
Unable to administer nebulizer tx due to pt hr 130-145> Consulting civil engineer notified

## 2023-08-10 NOTE — Progress Notes (Signed)
[facility] Cardiology  CARDIOLOGY PROGRESS NOTE  Patient ID: Olivia Lewis MRN: [id number] DOB/AGE: 87-12-1928 87 y.o.  Admit date: 08/06/2023 Referring Physician Para March Primary Physician [facility] Primary Cardiologist [facility] Reason for Consultation atrial fibrillation  HPI: 89 year old female referred for evaluation of atrial fibrillation with rapid ventricular rate.  Patient recently hospitalized 07/07/2023 - 07/11/2023 with respiratory failure and multiple rib fractures after falling.  The patient had abnormal barium swallow on 08/03/2023 and has been residing at [facility].  Friends noted that the patient experienced a productive cough and confusion the patient was brought to [facility] ED where she was noted to be hypoxic.  White count was 18,000.  CT was negative for PE but showed bilateral lower lobe consolidation.  Patient admitted for bilateral pneumonia, possible aspiration.  ECG revealed atrial fibrillation at 133 bpm.  Patient has a history of paroxysmal atrial fibrillation, on metoprolol tartrate for rate and rhythm control.  The patient is not anticoagulated due to frequent falls.  The patient was started on amiodarone drip, and converted to sinus rhythm at 60 bpm.  Interval history:  -Patient feeling ok this AM, without complaint at the time of my evaluation.  -Converted to AF RVR around 6 AM, restarted on amio infusion and rate better controlled at this time.  -Denies any chest pain, SOB, palpitations. Remains NPO given significant swallowing impairment.  Review of systems complete and found to be negative unless listed above     Past Medical History:  Diagnosis Date   Allergy    Anemia    Arthritis    B12 deficiency    Basal cell carcinoma    face, R arm, txted by Dr. Orson Aloe and [facility]   Cancer [facility])    skin--face and arms   Cataracts, bilateral    CHF (congestive heart failure) (HCC)    Chickenpox    CKD (chronic kidney disease), stage III (HCC)    Diverticulosis    GERD  (gastroesophageal reflux disease)    Hyperlipidemia    Hypertension    Hypothyroidism    Osteoporosis    Squamous cell carcinoma of skin 05/01/2021   right distal tricep at elbow, left forearm near elbow, right dorsum hand, left mid lat forearm - all treated with EDC   Squamous cell carcinoma of skin 05/01/2021   right calf - EDC 07/14/21   Squamous cell carcinoma of skin 08/18/2021   R sup calf - ED&C   Squamous cell carcinoma of skin 12/29/2021   L mandible - ED&C   Squamous cell carcinoma of skin 12/29/2021   L lat elbow - ED&C   Squamous cell carcinoma of skin 12/29/2021   L lat tricep - ED&C   Squamous cell carcinoma of skin 05/11/2022   Right deltoid - EDC   Squamous cell carcinoma of skin 05/11/2022   Left forearm - in situ - EDC    Past Surgical History:  Procedure Laterality Date   BUNIONECTOMY Bilateral    CARDIAC CATHETERIZATION     CATARACT EXTRACTION Bilateral    DILATION AND CURETTAGE OF UTERUS     EYE SURGERY Bilateral    cataract  surgery   JOINT REPLACEMENT Right    total knee replacement   meniscus tear Right    multiple skin cancer removal     NEUROMA SURGERY Bilateral    bilateral feet   rotator cuff surgery Right    TOTAL KNEE ARTHROPLASTY Left 05/06/2015   Procedure: TOTAL KNEE ARTHROPLASTY;  Surgeon: Kennedy Bucker, MD;  Location:  Multiple left-sided rib fractures are again seen stable from the prior study. No underlying pneumothorax is seen. Right-sided rib fractures are noted as well. Increased kyphosis is noted with upper thoracic compression deformity stable from the prior exam. Chronic manubrial and sternal fractures are seen. Review of the MIP images confirms the above findings. IMPRESSION: No evidence of pulmonary emboli. Bilateral lower lobe consolidation. Multiple rib fractures are noted bilaterally stable from the prior exam. Chronic thoracic compression deformities and sternal fractures are again noted. Aortic Atherosclerosis (ICD10-I70.0). Electronically Signed   By: Alcide Clever M.D.   On: 08/06/2023 23:00   DG Chest 1 View  Result Date: 08/06/2023 CLINICAL DATA:  Short of breath, possible aspiration EXAM: CHEST  1 VIEW COMPARISON:  05/18/2023, 07/09/2023 FINDINGS: 2 frontal views of the chest demonstrates stable enlargement of the cardiac silhouette. No acute airspace disease, effusion, or pneumothorax. Displaced left lateral fourth through seventh rib fractures are noted. Multiple prior healed bilateral rib fractures are again noted. Severe degenerative changes of the right shoulder. IMPRESSION: 1. No acute airspace disease. 2. Acute to subacute displaced left lateral fourth through seventh rib fractures. Electronically Signed   By: Sharlet Salina M.D.   On: 08/06/2023 18:37   DG SWALLOW FUNC OP MEDICARE SPEECH PATH  Result Date: 08/03/2023 Table formatting from the original result was not included. Modified Barium Swallow Study Patient Details Name: Olivia Lewis MRN: [id number] Date of Birth: 1929/07/25 Today's Date: 08/03/2023 HPI/PMH: HPI: Pt is a 89 y.o. female with medical history significant of Kyphosis ("sometimes wear a neck brace at home"), Gastric REFLUX, HFpEF, CKD stage III, hypertension, hyperlipidemia,  osteoporosis, hypothyroidism, and frequent Falls who presents to the ED on 07/11/2023 s/p Fall, rib fxs. Pt requires support w/ all ADLs at [facility]. Pt has Severe Kyphotic positioning impacting head up position for oral intake, sitting in chair. Clinical Impression Patient appears to present w/ mild-moderate chronic oropharyngeal phase dysphagia and Esophageal phase dysphagia. Contributing factors impacting swallowing include severe Kyphotic posture and structural age-related changes along w/ advance age(89yo). Oral phase is characterized by slow mastication and A-P transfer -- suspect impact of Gravity from head down/forwad position. Slight-min residue appeared to clear w/ f/u, Dry swallow. Swallow initiation appeared consistent at the level of the Valleculae. Pharyngeal phase is noted for functional base of tongue retraction and peristalsis w/ no pharyngeal residue collection post swallow. OF NOTE, suspected partial PE Segment opening/obstruction(?) appeared to contribute to decreased pharyngeal and PE Segment clearance of bolus material w/ all trials resulting in closure preventing full bolus clearance of the area -- a min-mod amount of the bolus remained and/or returned in the pharynx/Pyriform Sinuses post swallow. No obvious laryngeal penetration nore aspiration occurred d/t the pharyngeal residue, although, pt is at increased risk of such to occur.  Subsequent Dry swallow appeared somewhat effective in reducing/clearing the Pyriform Sinus residue. Many of the other pharyngeal components of swallowing(epiglottic inversion, exursion, BOT retraction) were difficult to assess d/t significantly obscured view d/t the Kyphotic positioning. No obvious aspiration occurred during trials given; possible slight/brief laryngeal penetration during larger sips of thin liquids (via straw) noted but no build-up w/ f/u trial. OF NOTE: pt exhibited Esophageal phase Dysmotility w/ retrograde flow to/through the pyriform  sinuses; bolus stasis in mid-Esophagus intermittently.  Due to pt's positioning and discomfort during the exam, further trials were not given and exam ended.  Pt and Family member were given Education post MBSS. Recommended strategies to include: small, single bites/sips, clear oropharyngnx w/ Dry  Multiple left-sided rib fractures are again seen stable from the prior study. No underlying pneumothorax is seen. Right-sided rib fractures are noted as well. Increased kyphosis is noted with upper thoracic compression deformity stable from the prior exam. Chronic manubrial and sternal fractures are seen. Review of the MIP images confirms the above findings. IMPRESSION: No evidence of pulmonary emboli. Bilateral lower lobe consolidation. Multiple rib fractures are noted bilaterally stable from the prior exam. Chronic thoracic compression deformities and sternal fractures are again noted. Aortic Atherosclerosis (ICD10-I70.0). Electronically Signed   By: Alcide Clever M.D.   On: 08/06/2023 23:00   DG Chest 1 View  Result Date: 08/06/2023 CLINICAL DATA:  Short of breath, possible aspiration EXAM: CHEST  1 VIEW COMPARISON:  05/18/2023, 07/09/2023 FINDINGS: 2 frontal views of the chest demonstrates stable enlargement of the cardiac silhouette. No acute airspace disease, effusion, or pneumothorax. Displaced left lateral fourth through seventh rib fractures are noted. Multiple prior healed bilateral rib fractures are again noted. Severe degenerative changes of the right shoulder. IMPRESSION: 1. No acute airspace disease. 2. Acute to subacute displaced left lateral fourth through seventh rib fractures. Electronically Signed   By: Sharlet Salina M.D.   On: 08/06/2023 18:37   DG SWALLOW FUNC OP MEDICARE SPEECH PATH  Result Date: 08/03/2023 Table formatting from the original result was not included. Modified Barium Swallow Study Patient Details Name: Olivia Lewis MRN: [id number] Date of Birth: 1929/07/25 Today's Date: 08/03/2023 HPI/PMH: HPI: Pt is a 89 y.o. female with medical history significant of Kyphosis ("sometimes wear a neck brace at home"), Gastric REFLUX, HFpEF, CKD stage III, hypertension, hyperlipidemia,  osteoporosis, hypothyroidism, and frequent Falls who presents to the ED on 07/11/2023 s/p Fall, rib fxs. Pt requires support w/ all ADLs at [facility]. Pt has Severe Kyphotic positioning impacting head up position for oral intake, sitting in chair. Clinical Impression Patient appears to present w/ mild-moderate chronic oropharyngeal phase dysphagia and Esophageal phase dysphagia. Contributing factors impacting swallowing include severe Kyphotic posture and structural age-related changes along w/ advance age(89yo). Oral phase is characterized by slow mastication and A-P transfer -- suspect impact of Gravity from head down/forwad position. Slight-min residue appeared to clear w/ f/u, Dry swallow. Swallow initiation appeared consistent at the level of the Valleculae. Pharyngeal phase is noted for functional base of tongue retraction and peristalsis w/ no pharyngeal residue collection post swallow. OF NOTE, suspected partial PE Segment opening/obstruction(?) appeared to contribute to decreased pharyngeal and PE Segment clearance of bolus material w/ all trials resulting in closure preventing full bolus clearance of the area -- a min-mod amount of the bolus remained and/or returned in the pharynx/Pyriform Sinuses post swallow. No obvious laryngeal penetration nore aspiration occurred d/t the pharyngeal residue, although, pt is at increased risk of such to occur.  Subsequent Dry swallow appeared somewhat effective in reducing/clearing the Pyriform Sinus residue. Many of the other pharyngeal components of swallowing(epiglottic inversion, exursion, BOT retraction) were difficult to assess d/t significantly obscured view d/t the Kyphotic positioning. No obvious aspiration occurred during trials given; possible slight/brief laryngeal penetration during larger sips of thin liquids (via straw) noted but no build-up w/ f/u trial. OF NOTE: pt exhibited Esophageal phase Dysmotility w/ retrograde flow to/through the pyriform  sinuses; bolus stasis in mid-Esophagus intermittently.  Due to pt's positioning and discomfort during the exam, further trials were not given and exam ended.  Pt and Family member were given Education post MBSS. Recommended strategies to include: small, single bites/sips, clear oropharyngnx w/ Dry  [facility] Cardiology  CARDIOLOGY PROGRESS NOTE  Patient ID: Olivia Lewis MRN: [id number] DOB/AGE: 87-12-1928 89 y.o.  Admit date: 08/06/2023 Referring Physician Para March Primary Physician [facility] Primary Cardiologist [facility] Reason for Consultation atrial fibrillation  HPI: 89 year old female referred for evaluation of atrial fibrillation with rapid ventricular rate.  Patient recently hospitalized 07/07/2023 - 07/11/2023 with respiratory failure and multiple rib fractures after falling.  The patient had abnormal barium swallow on 08/03/2023 and has been residing at [facility].  Friends noted that the patient experienced a productive cough and confusion the patient was brought to [facility] ED where she was noted to be hypoxic.  White count was 18,000.  CT was negative for PE but showed bilateral lower lobe consolidation.  Patient admitted for bilateral pneumonia, possible aspiration.  ECG revealed atrial fibrillation at 133 bpm.  Patient has a history of paroxysmal atrial fibrillation, on metoprolol tartrate for rate and rhythm control.  The patient is not anticoagulated due to frequent falls.  The patient was started on amiodarone drip, and converted to sinus rhythm at 60 bpm.  Interval history:  -Patient feeling ok this AM, without complaint at the time of my evaluation.  -Converted to AF RVR around 6 AM, restarted on amio infusion and rate better controlled at this time.  -Denies any chest pain, SOB, palpitations. Remains NPO given significant swallowing impairment.  Review of systems complete and found to be negative unless listed above     Past Medical History:  Diagnosis Date   Allergy    Anemia    Arthritis    B12 deficiency    Basal cell carcinoma    face, R arm, txted by Dr. Orson Aloe and [facility]   Cancer [facility])    skin--face and arms   Cataracts, bilateral    CHF (congestive heart failure) (HCC)    Chickenpox    CKD (chronic kidney disease), stage III (HCC)    Diverticulosis    GERD  (gastroesophageal reflux disease)    Hyperlipidemia    Hypertension    Hypothyroidism    Osteoporosis    Squamous cell carcinoma of skin 05/01/2021   right distal tricep at elbow, left forearm near elbow, right dorsum hand, left mid lat forearm - all treated with EDC   Squamous cell carcinoma of skin 05/01/2021   right calf - EDC 07/14/21   Squamous cell carcinoma of skin 08/18/2021   R sup calf - ED&C   Squamous cell carcinoma of skin 12/29/2021   L mandible - ED&C   Squamous cell carcinoma of skin 12/29/2021   L lat elbow - ED&C   Squamous cell carcinoma of skin 12/29/2021   L lat tricep - ED&C   Squamous cell carcinoma of skin 05/11/2022   Right deltoid - EDC   Squamous cell carcinoma of skin 05/11/2022   Left forearm - in situ - EDC    Past Surgical History:  Procedure Laterality Date   BUNIONECTOMY Bilateral    CARDIAC CATHETERIZATION     CATARACT EXTRACTION Bilateral    DILATION AND CURETTAGE OF UTERUS     EYE SURGERY Bilateral    cataract  surgery   JOINT REPLACEMENT Right    total knee replacement   meniscus tear Right    multiple skin cancer removal     NEUROMA SURGERY Bilateral    bilateral feet   rotator cuff surgery Right    TOTAL KNEE ARTHROPLASTY Left 05/06/2015   Procedure: TOTAL KNEE ARTHROPLASTY;  Surgeon: Kennedy Bucker, MD;  Location:  [facility] Cardiology  CARDIOLOGY PROGRESS NOTE  Patient ID: Olivia Lewis MRN: [id number] DOB/AGE: 87-12-1928 89 y.o.  Admit date: 08/06/2023 Referring Physician Para March Primary Physician [facility] Primary Cardiologist [facility] Reason for Consultation atrial fibrillation  HPI: 89 year old female referred for evaluation of atrial fibrillation with rapid ventricular rate.  Patient recently hospitalized 07/07/2023 - 07/11/2023 with respiratory failure and multiple rib fractures after falling.  The patient had abnormal barium swallow on 08/03/2023 and has been residing at [facility].  Friends noted that the patient experienced a productive cough and confusion the patient was brought to [facility] ED where she was noted to be hypoxic.  White count was 18,000.  CT was negative for PE but showed bilateral lower lobe consolidation.  Patient admitted for bilateral pneumonia, possible aspiration.  ECG revealed atrial fibrillation at 133 bpm.  Patient has a history of paroxysmal atrial fibrillation, on metoprolol tartrate for rate and rhythm control.  The patient is not anticoagulated due to frequent falls.  The patient was started on amiodarone drip, and converted to sinus rhythm at 60 bpm.  Interval history:  -Patient feeling ok this AM, without complaint at the time of my evaluation.  -Converted to AF RVR around 6 AM, restarted on amio infusion and rate better controlled at this time.  -Denies any chest pain, SOB, palpitations. Remains NPO given significant swallowing impairment.  Review of systems complete and found to be negative unless listed above     Past Medical History:  Diagnosis Date   Allergy    Anemia    Arthritis    B12 deficiency    Basal cell carcinoma    face, R arm, txted by Dr. Orson Aloe and [facility]   Cancer [facility])    skin--face and arms   Cataracts, bilateral    CHF (congestive heart failure) (HCC)    Chickenpox    CKD (chronic kidney disease), stage III (HCC)    Diverticulosis    GERD  (gastroesophageal reflux disease)    Hyperlipidemia    Hypertension    Hypothyroidism    Osteoporosis    Squamous cell carcinoma of skin 05/01/2021   right distal tricep at elbow, left forearm near elbow, right dorsum hand, left mid lat forearm - all treated with EDC   Squamous cell carcinoma of skin 05/01/2021   right calf - EDC 07/14/21   Squamous cell carcinoma of skin 08/18/2021   R sup calf - ED&C   Squamous cell carcinoma of skin 12/29/2021   L mandible - ED&C   Squamous cell carcinoma of skin 12/29/2021   L lat elbow - ED&C   Squamous cell carcinoma of skin 12/29/2021   L lat tricep - ED&C   Squamous cell carcinoma of skin 05/11/2022   Right deltoid - EDC   Squamous cell carcinoma of skin 05/11/2022   Left forearm - in situ - EDC    Past Surgical History:  Procedure Laterality Date   BUNIONECTOMY Bilateral    CARDIAC CATHETERIZATION     CATARACT EXTRACTION Bilateral    DILATION AND CURETTAGE OF UTERUS     EYE SURGERY Bilateral    cataract  surgery   JOINT REPLACEMENT Right    total knee replacement   meniscus tear Right    multiple skin cancer removal     NEUROMA SURGERY Bilateral    bilateral feet   rotator cuff surgery Right    TOTAL KNEE ARTHROPLASTY Left 05/06/2015   Procedure: TOTAL KNEE ARTHROPLASTY;  Surgeon: Kennedy Bucker, MD;  Location:  [facility] Cardiology  CARDIOLOGY PROGRESS NOTE  Patient ID: Olivia Lewis MRN: [id number] DOB/AGE: 87-12-1928 89 y.o.  Admit date: 08/06/2023 Referring Physician Para March Primary Physician [facility] Primary Cardiologist [facility] Reason for Consultation atrial fibrillation  HPI: 89 year old female referred for evaluation of atrial fibrillation with rapid ventricular rate.  Patient recently hospitalized 07/07/2023 - 07/11/2023 with respiratory failure and multiple rib fractures after falling.  The patient had abnormal barium swallow on 08/03/2023 and has been residing at [facility].  Friends noted that the patient experienced a productive cough and confusion the patient was brought to [facility] ED where she was noted to be hypoxic.  White count was 18,000.  CT was negative for PE but showed bilateral lower lobe consolidation.  Patient admitted for bilateral pneumonia, possible aspiration.  ECG revealed atrial fibrillation at 133 bpm.  Patient has a history of paroxysmal atrial fibrillation, on metoprolol tartrate for rate and rhythm control.  The patient is not anticoagulated due to frequent falls.  The patient was started on amiodarone drip, and converted to sinus rhythm at 60 bpm.  Interval history:  -Patient feeling ok this AM, without complaint at the time of my evaluation.  -Converted to AF RVR around 6 AM, restarted on amio infusion and rate better controlled at this time.  -Denies any chest pain, SOB, palpitations. Remains NPO given significant swallowing impairment.  Review of systems complete and found to be negative unless listed above     Past Medical History:  Diagnosis Date   Allergy    Anemia    Arthritis    B12 deficiency    Basal cell carcinoma    face, R arm, txted by Dr. Orson Aloe and [facility]   Cancer [facility])    skin--face and arms   Cataracts, bilateral    CHF (congestive heart failure) (HCC)    Chickenpox    CKD (chronic kidney disease), stage III (HCC)    Diverticulosis    GERD  (gastroesophageal reflux disease)    Hyperlipidemia    Hypertension    Hypothyroidism    Osteoporosis    Squamous cell carcinoma of skin 05/01/2021   right distal tricep at elbow, left forearm near elbow, right dorsum hand, left mid lat forearm - all treated with EDC   Squamous cell carcinoma of skin 05/01/2021   right calf - EDC 07/14/21   Squamous cell carcinoma of skin 08/18/2021   R sup calf - ED&C   Squamous cell carcinoma of skin 12/29/2021   L mandible - ED&C   Squamous cell carcinoma of skin 12/29/2021   L lat elbow - ED&C   Squamous cell carcinoma of skin 12/29/2021   L lat tricep - ED&C   Squamous cell carcinoma of skin 05/11/2022   Right deltoid - EDC   Squamous cell carcinoma of skin 05/11/2022   Left forearm - in situ - EDC    Past Surgical History:  Procedure Laterality Date   BUNIONECTOMY Bilateral    CARDIAC CATHETERIZATION     CATARACT EXTRACTION Bilateral    DILATION AND CURETTAGE OF UTERUS     EYE SURGERY Bilateral    cataract  surgery   JOINT REPLACEMENT Right    total knee replacement   meniscus tear Right    multiple skin cancer removal     NEUROMA SURGERY Bilateral    bilateral feet   rotator cuff surgery Right    TOTAL KNEE ARTHROPLASTY Left 05/06/2015   Procedure: TOTAL KNEE ARTHROPLASTY;  Surgeon: Kennedy Bucker, MD;  Location:  Multiple left-sided rib fractures are again seen stable from the prior study. No underlying pneumothorax is seen. Right-sided rib fractures are noted as well. Increased kyphosis is noted with upper thoracic compression deformity stable from the prior exam. Chronic manubrial and sternal fractures are seen. Review of the MIP images confirms the above findings. IMPRESSION: No evidence of pulmonary emboli. Bilateral lower lobe consolidation. Multiple rib fractures are noted bilaterally stable from the prior exam. Chronic thoracic compression deformities and sternal fractures are again noted. Aortic Atherosclerosis (ICD10-I70.0). Electronically Signed   By: Alcide Clever M.D.   On: 08/06/2023 23:00   DG Chest 1 View  Result Date: 08/06/2023 CLINICAL DATA:  Short of breath, possible aspiration EXAM: CHEST  1 VIEW COMPARISON:  05/18/2023, 07/09/2023 FINDINGS: 2 frontal views of the chest demonstrates stable enlargement of the cardiac silhouette. No acute airspace disease, effusion, or pneumothorax. Displaced left lateral fourth through seventh rib fractures are noted. Multiple prior healed bilateral rib fractures are again noted. Severe degenerative changes of the right shoulder. IMPRESSION: 1. No acute airspace disease. 2. Acute to subacute displaced left lateral fourth through seventh rib fractures. Electronically Signed   By: Sharlet Salina M.D.   On: 08/06/2023 18:37   DG SWALLOW FUNC OP MEDICARE SPEECH PATH  Result Date: 08/03/2023 Table formatting from the original result was not included. Modified Barium Swallow Study Patient Details Name: Olivia Lewis MRN: [id number] Date of Birth: 1929/07/25 Today's Date: 08/03/2023 HPI/PMH: HPI: Pt is a 89 y.o. female with medical history significant of Kyphosis ("sometimes wear a neck brace at home"), Gastric REFLUX, HFpEF, CKD stage III, hypertension, hyperlipidemia,  osteoporosis, hypothyroidism, and frequent Falls who presents to the ED on 07/11/2023 s/p Fall, rib fxs. Pt requires support w/ all ADLs at [facility]. Pt has Severe Kyphotic positioning impacting head up position for oral intake, sitting in chair. Clinical Impression Patient appears to present w/ mild-moderate chronic oropharyngeal phase dysphagia and Esophageal phase dysphagia. Contributing factors impacting swallowing include severe Kyphotic posture and structural age-related changes along w/ advance age(89yo). Oral phase is characterized by slow mastication and A-P transfer -- suspect impact of Gravity from head down/forwad position. Slight-min residue appeared to clear w/ f/u, Dry swallow. Swallow initiation appeared consistent at the level of the Valleculae. Pharyngeal phase is noted for functional base of tongue retraction and peristalsis w/ no pharyngeal residue collection post swallow. OF NOTE, suspected partial PE Segment opening/obstruction(?) appeared to contribute to decreased pharyngeal and PE Segment clearance of bolus material w/ all trials resulting in closure preventing full bolus clearance of the area -- a min-mod amount of the bolus remained and/or returned in the pharynx/Pyriform Sinuses post swallow. No obvious laryngeal penetration nore aspiration occurred d/t the pharyngeal residue, although, pt is at increased risk of such to occur.  Subsequent Dry swallow appeared somewhat effective in reducing/clearing the Pyriform Sinus residue. Many of the other pharyngeal components of swallowing(epiglottic inversion, exursion, BOT retraction) were difficult to assess d/t significantly obscured view d/t the Kyphotic positioning. No obvious aspiration occurred during trials given; possible slight/brief laryngeal penetration during larger sips of thin liquids (via straw) noted but no build-up w/ f/u trial. OF NOTE: pt exhibited Esophageal phase Dysmotility w/ retrograde flow to/through the pyriform  sinuses; bolus stasis in mid-Esophagus intermittently.  Due to pt's positioning and discomfort during the exam, further trials were not given and exam ended.  Pt and Family member were given Education post MBSS. Recommended strategies to include: small, single bites/sips, clear oropharyngnx w/ Dry

## 2023-08-10 NOTE — Plan of Care (Signed)

## 2023-08-11 DIAGNOSIS — R1310 Dysphagia, unspecified: Secondary | ICD-10-CM | POA: Diagnosis not present

## 2023-08-11 DIAGNOSIS — Z515 Encounter for palliative care: Secondary | ICD-10-CM | POA: Diagnosis not present

## 2023-08-11 DIAGNOSIS — J9601 Acute respiratory failure with hypoxia: Secondary | ICD-10-CM | POA: Diagnosis not present

## 2023-08-11 DIAGNOSIS — Z7189 Other specified counseling: Secondary | ICD-10-CM | POA: Diagnosis not present

## 2023-08-11 LAB — CBC
HCT: 41.5 % (ref 36.0–46.0)
Hemoglobin: 13.6 g/dL (ref 12.0–15.0)
MCH: 29.2 pg (ref 26.0–34.0)
MCHC: 32.8 g/dL (ref 30.0–36.0)
MCV: 89.2 fL (ref 80.0–100.0)
Platelets: 260 10*3/uL (ref 150–400)
RBC: 4.65 MIL/uL (ref 3.87–5.11)
RDW: 15.7 % — ABNORMAL HIGH (ref 11.5–15.5)
WBC: 9.2 10*3/uL (ref 4.0–10.5)
nRBC: 0 % (ref 0.0–0.2)

## 2023-08-11 LAB — BASIC METABOLIC PANEL
Anion gap: 12 (ref 5–15)
BUN: 18 mg/dL (ref 8–23)
CO2: 26 mmol/L (ref 22–32)
Calcium: 8.7 mg/dL — ABNORMAL LOW (ref 8.9–10.3)
Chloride: 102 mmol/L (ref 98–111)
Creatinine, Ser: 0.8 mg/dL (ref 0.44–1.00)
GFR, Estimated: >60 mL/min (ref >60)
Glucose, Bld: 68 mg/dL — ABNORMAL LOW (ref 70–99)
Potassium: 3.6 mmol/L (ref 3.5–5.1)
Sodium: 140 mmol/L (ref 135–145)

## 2023-08-11 LAB — CULTURE, BLOOD (ROUTINE X 2)
Culture: NO GROWTH
Culture: NO GROWTH

## 2023-08-11 LAB — GLUCOSE, CAPILLARY
Glucose-Capillary: 75 mg/dL (ref 70–99)
Glucose-Capillary: 71 mg/dL (ref 70–99)
Glucose-Capillary: 84 mg/dL (ref 70–99)
Glucose-Capillary: 110 mg/dL — ABNORMAL HIGH (ref 70–99)

## 2023-08-11 LAB — MAGNESIUM: Magnesium: 1.8 mg/dL (ref 1.7–2.4)

## 2023-08-11 MED ORDER — DEXTROSE-SODIUM CHLORIDE 5-0.45 % IV SOLN: INTRAVENOUS | Status: DC

## 2023-08-11 MED ORDER — GLYCOPYRROLATE 0.2 MG/ML IJ SOLN
0.4000 mg | Freq: Three times a day (TID) | INTRAMUSCULAR | Status: DC
  Administered 2023-08-11 – 2023-08-12 (×2): 0.4 mg via INTRAVENOUS
  Filled 2023-08-11 (×4): qty 2

## 2023-08-11 MED ORDER — MORPHINE SULFATE (PF) 2 MG/ML IV SOLN
1.0000 mg | INTRAVENOUS | Status: DC | PRN
  Administered 2023-08-11: 1 mg via INTRAVENOUS
  Filled 2023-08-11: qty 1

## 2023-08-11 NOTE — Progress Notes (Signed)
Palliative:  HPI: 89 y.o. female  with past medical history of dysphagia, HFpEF, AF RVR, CKD stage 3a, HTN, HLD, osteoporosis, hypothyroidism, monoclonal B-cell lymphocytosis, frequent falls, recent hospitalization due to fall with rib fractures admitted on 08/06/2023 from [facility]F with shortness of breath and productive cough worsening found to have bilateral pneumonia.   I met today with Olivia Lewis and friend, Olivia Lewis, at bedside. Olivia Lewis and I review that her pneumonia seems to be treated but high risk for developing recurrent pneumonia due to high aspiration risk. She is unable to tolerate any form of po intake and unable to take medications/pills. Olivia Lewis understands that her body cannot maintain like this and she tells me that she is at peace. We discussed next steps and consideration of comfort care and hospice and she understands this is the best way we can help her. She wishes to discuss with her support system.   Update: I met today with Olivia Lewis, friend Olivia Lewis, friend/HCPOA Olivia (Dianna), and pastor at bedside along with nephew Olivia Lewis via speaker phone. We reviewed overall poor prognosis with inability to swallow and have intake. She is maintained on IVF for now but this is temporary - she knows without intake her kidneys will shut down. She is maintained on amiodarone infusion for now but unable to take pill so I anticipate that she will have uncontrolled afib once infusion stopped. Secretions are slightly improved today - likely do to completion of antibiotics and no po intake but likely to increase with recurrent aspiration and now with IVF. All involved in discussion agree with pursuing care at hospice facility for Olivia Lewis to have excellent care at end of life. She is hopeful to continue to settle her affairs/estate to ensure her sister is left funds to ensure her care and would like to visit with her sister one last time. Friends/pastor will work to have sister come visit - they are hopeful they can make this  happen outside the hospital. Will continue IVF and amiodarone infusion for now knowing these will not be continued at hospice facility. All are hopeful for hospice placement.   All questions/concerns addressed. Updated Dr. Fran Lowes, RN, [facility], hospice liaison.   Exam: Alert, oriented (gets mixed up slightly at times). Severe kyphosis. Moderate secretions. Breathing regular, unlabored. Abd soft. Generalized weakness and fatigue.   Plan: - DNR - Hopeful for hospice facility placement  80 min  Yong Channel, NP Palliative Medicine Team Pager [phone] (Please see [email or web address] for schedule) Team Phone [phone]    Greater than 50%  of this time was spent counseling and coordinating care related to the above assessment and plan

## 2023-08-11 NOTE — Plan of Care (Signed)

## 2023-08-11 NOTE — Progress Notes (Signed)
[facility]- Civil engineer, contracting  Received a referral to evaluate patient for [facility].  Met with patient and friends to confirm interest and explain services.  Approval is pending at this time.  [facility] will follow up tomorrow to further evaluate to see if patient is appropriate for [facility] to [facility].    Please don't hesitate to call with any Hospice related questions or concerns.    Thank you for the opportunity to participate in this patient's care. [facility] Liaison [phone]

## 2023-08-11 NOTE — Progress Notes (Signed)
PT Cancellation Note  Patient Details Name: CLARESE AKIN MRN: [id number] DOB: 1929-04-29   Cancelled Treatment:     Pt's RR at 30 with HR erratic between 58-129bpm. Planned family meeting tomorrow to discuss hospice vs comfort care. Will hold PT session this pm and proceed as indicated after scheduled meeting.    Jannet Askew 08/11/2023, 4:18 PM

## 2023-08-11 NOTE — Plan of Care (Signed)
  Problem: Pain Managment: Goal: General experience of comfort will improve Outcome: Progressing   Problem: Safety: Goal: Ability to remain free from injury will improve Outcome: Progressing   

## 2023-08-11 NOTE — Progress Notes (Signed)
PROGRESS NOTE    Olivia HRITZ  [name] DOB: 1929-08-01 DOA: 08/06/2023 PCP: Olivia Nurse, MD  [id number]-[id number]  LOS: 5 days   Brief hospital course:   Assessment & Plan: Olivia Lewis is a 89 y.o. female with medical history significant for HFpEF, CKD stage IIIa, hypertension, osteoporosis, hypothyroidism, and frequent falls, recently hospitalized from 8/15 to 8/19 with respiratory failure secondary to multiple rib fractures from a fall, as well as chronic dysphagia s/p abnormal barium swallow on 9/11 who was sent from [facility] for evaluation of shortness of breath, productive cough and confusion that was noticed when her church friends visited her.    History is given by friends at bedside, Olivia Lewis and Olivia Lewis who stated that they saw the patient on the day prior and she had a cough and was mildly confused but on the day of arrival she was much more confused.  They stated that at baseline patient is very sharp and with it and manages her own affairs and is the power of attorney of her sister who is on the memory care unit.  On arrival of EMS she was reportedly saturating in the 60s on room air and was placed on oxygen, requiring up to 6 L to maintain sats in the 90s.    Bilateral pneumonia Acute on chronic respiratory failure with hypoxia On chronic 2L O2 History of multiple rib fractures 07/07/2023 Possible ongoing aspiration (abnormal barium swallow 9/11), complicated by recent rib fractures O2 sat in the 60s with EMS requiring 6 L to maintain sats in the 90s, now titrated down to 2 L --completed 5 days of ceftriaxone. --Mucomyst neb for mucus clearance Plan: --IS --Continue supplemental O2 to keep sats >=92%, wean as tolerated  Dysphagia --pt has trouble swallowing solids.  RN reported difficulty even with apple sauce. --SLP eval, palliative care consulted, pt would not want feeding tube --Dys 1 with nectar thick, however, pt still doesn't tolerate --palliative care  consulted.  Pt wishes to be considered for hospice facility after she settles affairs for her sister.  Afib w RVR History of paroxysmal atrial fibrillation  --hypotension following IV dilt.  Started on IV amio.  Cardio consulted. --on metop PTA, not on anticoagulation due to frequent falls --could not take oral amio, so pt relapsed into Afib w RVR Plan: --cont amio gtt for now  Frequent falls Patient has a history of recurrent falls that are often mechanical in nature.  Until her last fall in 07/07/23 lived at home independently, now at [name] since 07/11/2023 Fall precautions  Acute metabolic encephalopathy Patient was reported to be confused on presentation which is not her baseline.  Likely due to respiratory failure --mental status improved and back to baseline next day  Chronic kidney disease, stage 3a (HCC) Renal function currently at baseline. Intermittent monitoring  Monoclonal B-cell lymphocytosis of undetermined significance Follows with heme-onc  Severe sepsis due to pneumonia    DVT prophylaxis: Lovenox SQ Code Status: DNR  Family Communication: friends updated at bedside today Level of care: Progressive Dispo:   The patient is from: SNF rehab Anticipated d/c is to: facility with hospice Anticipated d/c date is:  pt needs to settle affairs for her sister before transitioning into hospice   Subjective and Interval History:  Still can't swallow.  Developing hypoglycemia, started on D5 infusion.  Assessment for hospice facility candidacy performed today.     Objective: Vitals:   08/11/23 0900 08/11/23 1000 08/11/23 1100 08/11/23 1700  BP: 139/71 Marland Kitchen)  140/61 (!) 145/71 136/75  Pulse:   71 64  Resp: 18 (!) 23 (!) 30   Temp:   98.2 F (36.8 C) 98 F (36.7 C)  TempSrc:   Oral Oral  SpO2:   95% 96%  Height:        Intake/Output Summary (Last 24 hours) at 08/11/2023 1820 Last data filed at 08/11/2023 0441 Gross per 24 hour  Intake 422.33 ml  Output 450  ml  Net -27.67 ml   There were no vitals filed for this visit.  Examination:   Constitutional: NAD, AAOx3 HEENT: conjunctivae and lids normal, EOMI, soft collar present CV: No cyanosis.   RESP: gurgling breath sounds, on 2L Psych: Normal mood and affect.  Appropriate judgement and reason    Data Reviewed: I have personally reviewed labs and imaging studies  Time spent: 35 minutes  Darlin Priestly, MD [facility] If 7PM-7AM, please contact night-coverage 08/11/2023, 6:20 PM

## 2023-08-11 NOTE — Progress Notes (Signed)
[facility] Cardiology  CARDIOLOGY PROGRESS NOTE  Patient ID: Olivia Lewis MRN: [id number] DOB/AGE: October 29, 1929 89 y.o.  Admit date: 08/06/2023 Referring Physician Para March Primary Physician [facility] Primary Cardiologist [facility] Reason for Consultation atrial fibrillation  HPI: 89 year old female referred for evaluation of atrial fibrillation with rapid ventricular rate.  Patient recently hospitalized 07/07/2023 - 07/11/2023 with respiratory failure and multiple rib fractures after falling.  The patient had abnormal barium swallow on 08/03/2023 and has been residing at [facility].  Friends noted that the patient experienced a productive cough and confusion the patient was brought to [facility] ED where she was noted to be hypoxic.  White count was 18,000.  CT was negative for PE but showed bilateral lower lobe consolidation.  Patient admitted for bilateral pneumonia, possible aspiration.  ECG revealed atrial fibrillation at 133 bpm.  Patient has a history of paroxysmal atrial fibrillation, on metoprolol tartrate for rate and rhythm control.  The patient is not anticoagulated due to frequent falls.  The patient was started on amiodarone drip, and converted to sinus rhythm at 60 bpm.  Interval history:  -Patient feeling ok this AM, she met with palliative care NP today. Likely there will be a family meeting tomorrow and patient will transition to hospice/comfort care. Cardiology will sign off at this time. Continue using amio gtt for Afib until officially hospice.  Review of systems complete and found to be negative unless listed above     Past Medical History:  Diagnosis Date   Allergy    Anemia    Arthritis    B12 deficiency    Basal cell carcinoma    face, R arm, txted by Dr. Orson Aloe and [facility]   Cancer [facility])    skin--face and arms   Cataracts, bilateral    CHF (congestive heart failure) (HCC)    Chickenpox    CKD (chronic kidney disease), stage III (HCC)    Diverticulosis    GERD  (gastroesophageal reflux disease)    Hyperlipidemia    Hypertension    Hypothyroidism    Osteoporosis    Squamous cell carcinoma of skin 05/01/2021   right distal tricep at elbow, left forearm near elbow, right dorsum hand, left mid lat forearm - all treated with EDC   Squamous cell carcinoma of skin 05/01/2021   right calf - EDC 07/14/21   Squamous cell carcinoma of skin 08/18/2021   R sup calf - ED&C   Squamous cell carcinoma of skin 12/29/2021   L mandible - ED&C   Squamous cell carcinoma of skin 12/29/2021   L lat elbow - ED&C   Squamous cell carcinoma of skin 12/29/2021   L lat tricep - ED&C   Squamous cell carcinoma of skin 05/11/2022   Right deltoid - EDC   Squamous cell carcinoma of skin 05/11/2022   Left forearm - in situ - EDC    Past Surgical History:  Procedure Laterality Date   BUNIONECTOMY Bilateral    CARDIAC CATHETERIZATION     CATARACT EXTRACTION Bilateral    DILATION AND CURETTAGE OF UTERUS     EYE SURGERY Bilateral    cataract  surgery   JOINT REPLACEMENT Right    total knee replacement   meniscus tear Right    multiple skin cancer removal     NEUROMA SURGERY Bilateral    bilateral feet   rotator cuff surgery Right    TOTAL KNEE ARTHROPLASTY Left 05/06/2015   Procedure: TOTAL KNEE ARTHROPLASTY;  Surgeon: Kennedy Bucker, MD;  Location: [facility] ORS;  Service: Orthopedics;  Laterality: Left;    Medications Prior to Admission  Medication Sig Dispense Refill Last Dose   levothyroxine (SYNTHROID, LEVOTHROID) 50 MCG tablet Take 50 mcg by mouth daily at 6 (six) AM.   08/06/2023 at 0600   magic mouthwash (nystatin, lidocaine, diphenhydrAMINE, alum & mag hydroxide) suspension Swish and spit 15 mLs 4 (four) times daily as needed for mouth pain.   08/05/2023   metoprolol tartrate 37.5 MG TABS Take 1 tablet (37.5 mg total) by mouth 2 (two) times daily.   08/06/2023   nystatin cream (MYCOSTATIN) Apply 1 Application topically 2 (two) times daily.   Past Month   oxyCODONE (OXY  IR/ROXICODONE) 5 MG immediate release tablet Take 0.5 tablets (2.5 mg total) by mouth every 6 (six) hours as needed for moderate pain or severe pain. 30 tablet 0 08/06/2023   Zinc Oxide (TRIPLE PASTE) 12.8 % ointment Apply 1 Application topically. Every shift.      acetaminophen (TYLENOL) 500 MG tablet Take 1,000 mg by mouth daily as needed.    prn at prn   Calcium Carb-Cholecalciferol 500-200 MG-UNIT TABS Take 1 tablet by mouth daily.    08/05/2023 at 0800   Cyanocobalamin (B-12 SL) Place 2,500 mcg under the tongue daily.    08/05/2023 at 0900   docusate sodium (COLACE) 100 MG capsule Take 100 mg by mouth daily as needed.   prn at prn   fluconazole (DIFLUCAN) 100 MG tablet 2 tablets today then daily for 4 days   08/02/2023   Lidocaine 4 % GEL Apply to bilateral shoulders topically every 24 hours as needed. On 12 hours and off 12 hours.   prn at prn   lisinopril (ZESTRIL) 10 MG tablet Take 1 tablet (10 mg total) by mouth daily. 30 tablet 11 08/05/2023 at 0800   OXYGEN 2lpm every shift continuous for hypoxia      pantoprazole (PROTONIX) 40 MG tablet Take 1 tablet (40 mg total) by mouth daily.   08/05/2023   polyethylene glycol (MIRALAX / GLYCOLAX) 17 g packet Take 34 g by mouth daily.   08/05/2023   senna (SENOKOT) 8.6 MG TABS tablet Take 1 tablet (8.6 mg total) by mouth daily.   08/05/2023   Social History   Socioeconomic History   Marital status: Widowed    Spouse name: Not on file   Number of children: Not on file   Years of education: Not on file   Highest education level: Not on file  Occupational History   Not on file  Tobacco Use   Smoking status: Never   Smokeless tobacco: Never  Vaping Use   Vaping status: Never Used  Substance and Sexual Activity   Alcohol use: No   Drug use: No   Sexual activity: Not Currently  Other Topics Concern   Not on file  Social History Narrative   Not on file   Social Determinants of Health   Financial Resource Strain: Not on file  Food Insecurity:  Patient Unable To Answer (08/08/2023)   Hunger Vital Sign    Worried About Running Out of Food in the Last Year: Patient unable to answer    Ran Out of Food in the Last Year: Patient unable to answer  Transportation Needs: Patient Unable To Answer (08/08/2023)   PRAPARE - Transportation    Lack of Transportation (Medical): Patient unable to answer    Lack of Transportation (Non-Medical): Patient unable to answer  Physical Activity: Not on file  Stress: Not on file  Social Connections: Not on file  Intimate Partner Violence: Patient Unable To Answer (08/08/2023)   Humiliation, Afraid, Rape, and Kick questionnaire    Fear of Current or Ex-Partner: Patient unable to answer    Emotionally Abused: Patient unable to answer    Physically Abused: Patient unable to answer    Sexually Abused: Patient unable to answer    Family History  Problem Relation Age of Onset   Hypertension Mother    Hypertension Father    Diabetes Sister    Breast cancer Sister    Colon cancer Sister    Bone cancer Brother       Review of systems complete and found to be negative unless listed above    PHYSICAL EXAM General: Well developed, well nourished, in no acute distress with sister at bedside HEENT:  Normocephalic and atramatic Neck:  No JVD.  Lungs: Clear bilaterally to auscultation and percussion. Heart: irregularly irregular, fast rate. Normal S1 and S2 without gallops or murmurs.  Abdomen: Bowel sounds are positive, abdomen soft and non-tender  Msk:  Back normal, normal gait. Normal strength and tone for age. Extremities: No clubbing, cyanosis or edema.   Neuro: Alert and oriented X 3. Psych:  Good affect, responds appropriately  Labs:   Lab Results  Component Value Date   WBC 9.2 08/11/2023   HGB 13.6 08/11/2023   HCT 41.5 08/11/2023   MCV 89.2 08/11/2023   PLT 260 08/11/2023    Recent Labs  Lab 08/11/23 0859  NA 140  K 3.6  CL 102  CO2 26  BUN 18  CREATININE 0.80  CALCIUM 8.7*   GLUCOSE 68*   Lab Results  Component Value Date   TROPONINI <0.03 02/12/2018    Lab Results  Component Value Date   CHOL 130 04/09/2021   Lab Results  Component Value Date   HDL 49 04/09/2021   Lab Results  Component Value Date   LDLCALC 72 04/09/2021   Lab Results  Component Value Date   TRIG 47 04/09/2021   Lab Results  Component Value Date   CHOLHDL 2.7 04/09/2021   No results found for: "LDLDIRECT"    Radiology: CT Angio Chest PE W and/or Wo Contrast  Result Date: 08/06/2023 CLINICAL DATA:  Hypoxia and shortness of breath EXAM: CT ANGIOGRAPHY CHEST WITH CONTRAST TECHNIQUE: Multidetector CT imaging of the chest was performed using the standard protocol during bolus administration of intravenous contrast. Multiplanar CT image reconstructions and MIPs were obtained to evaluate the vascular anatomy. RADIATION DOSE REDUCTION: This exam was performed according to the departmental dose-optimization program which includes automated exposure control, adjustment of the mA and/or kV according to patient size and/or use of iterative reconstruction technique. CONTRAST:  75mL OMNIPAQUE IOHEXOL 350 MG/ML SOLN COMPARISON:  Chest x-ray from earlier in the same day. CT from 07/09/2023 FINDINGS: Cardiovascular: Atherosclerotic calcifications of the thoracic aorta are noted. The degree of opacification is limited precluding evaluation for dissection. No aneurysmal dilatation is seen. Coronary calcifications are noted. The pulmonary artery shows a normal branching pattern bilaterally. No filling defect to suggest pulmonary embolism is identified. Mediastinum/Nodes: Thoracic inlet is within normal limits. No hilar or mediastinal adenopathy is noted. The esophagus as visualized is within normal limits. Lungs/Pleura: Mild lower lobe consolidation is noted bilaterally. No bronchial obstructive changes are seen. No focal nodule or sizable pleural effusion is seen. Upper Abdomen: Visualized upper abdomen is  within normal limits. Musculoskeletal: Degenerative changes of the thoracic spine are noted. Multiple left-sided rib fractures  are again seen stable from the prior study. No underlying pneumothorax is seen. Right-sided rib fractures are noted as well. Increased kyphosis is noted with upper thoracic compression deformity stable from the prior exam. Chronic manubrial and sternal fractures are seen. Review of the MIP images confirms the above findings. IMPRESSION: No evidence of pulmonary emboli. Bilateral lower lobe consolidation. Multiple rib fractures are noted bilaterally stable from the prior exam. Chronic thoracic compression deformities and sternal fractures are again noted. Aortic Atherosclerosis (ICD10-I70.0). Electronically Signed   By: Alcide Clever M.D.   On: 08/06/2023 23:00   DG Chest 1 View  Result Date: 08/06/2023 CLINICAL DATA:  Short of breath, possible aspiration EXAM: CHEST  1 VIEW COMPARISON:  05/18/2023, 07/09/2023 FINDINGS: 2 frontal views of the chest demonstrates stable enlargement of the cardiac silhouette. No acute airspace disease, effusion, or pneumothorax. Displaced left lateral fourth through seventh rib fractures are noted. Multiple prior healed bilateral rib fractures are again noted. Severe degenerative changes of the right shoulder. IMPRESSION: 1. No acute airspace disease. 2. Acute to subacute displaced left lateral fourth through seventh rib fractures. Electronically Signed   By: Sharlet Salina M.D.   On: 08/06/2023 18:37   DG SWALLOW FUNC OP MEDICARE SPEECH PATH  Result Date: 08/03/2023 Table formatting from the original result was not included. Modified Barium Swallow Study Patient Details Name: Olivia Lewis MRN: [id number] Date of Birth: Aug 08, 1929 Today's Date: 08/03/2023 HPI/PMH: HPI: Pt is a 89 y.o. female with medical history significant of Kyphosis ("sometimes wear a neck brace at home"), Gastric REFLUX, HFpEF, CKD stage III, hypertension, hyperlipidemia,  osteoporosis, hypothyroidism, and frequent Falls who presents to the ED on 07/11/2023 s/p Fall, rib fxs. Pt requires support w/ all ADLs at [facility]. Pt has Severe Kyphotic positioning impacting head up position for oral intake, sitting in chair. Clinical Impression Patient appears to present w/ mild-moderate chronic oropharyngeal phase dysphagia and Esophageal phase dysphagia. Contributing factors impacting swallowing include severe Kyphotic posture and structural age-related changes along w/ advance age(89yo). Oral phase is characterized by slow mastication and A-P transfer -- suspect impact of Gravity from head down/forwad position. Slight-min residue appeared to clear w/ f/u, Dry swallow. Swallow initiation appeared consistent at the level of the Valleculae. Pharyngeal phase is noted for functional base of tongue retraction and peristalsis w/ no pharyngeal residue collection post swallow. OF NOTE, suspected partial PE Segment opening/obstruction(?) appeared to contribute to decreased pharyngeal and PE Segment clearance of bolus material w/ all trials resulting in closure preventing full bolus clearance of the area -- a min-mod amount of the bolus remained and/or returned in the pharynx/Pyriform Sinuses post swallow. No obvious laryngeal penetration nore aspiration occurred d/t the pharyngeal residue, although, pt is at increased risk of such to occur.  Subsequent Dry swallow appeared somewhat effective in reducing/clearing the Pyriform Sinus residue. Many of the other pharyngeal components of swallowing(epiglottic inversion, exursion, BOT retraction) were difficult to assess d/t significantly obscured view d/t the Kyphotic positioning. No obvious aspiration occurred during trials given; possible slight/brief laryngeal penetration during larger sips of thin liquids (via straw) noted but no build-up w/ f/u trial. OF NOTE: pt exhibited Esophageal phase Dysmotility w/ retrograde flow to/through the pyriform  sinuses; bolus stasis in mid-Esophagus intermittently.  Due to pt's positioning and discomfort during the exam, further trials were not given and exam ended.  Pt and Family member were given Education post MBSS. Recommended strategies to include: small, single bites/sips, clear oropharyngnx w/ Dry swallow prior to taking  another bite/sip, alternate food/liquid. Head forward posture w/ oral intake. Oral care prior to meals to reduce oral bacterial load. STRICT REFLUX PRECAUTIONS including remaining upright for ~45-60 mins post meals. Recommend to continue to monitor for any negative sequelae from potential aspiration/REFLUX aspiration and consult MD, Palliative Care. Factors that may increase risk of adverse event in presence of aspiration [employer] 2021): Poor general health and/or compromised immunity;Limited mobility;Frail or deconditioned;Dependence for feeding and/or oral hygiene (Kyphotic positioning) Recommendations/Plan: Swallowing Evaluation Recommendations Swallowing Evaluation Recommendations Recommendations: PO diet PO Diet Recommendation: Dysphagia 2 (Finely chopped); Dysphagia 3 (Mechanical soft); Thin liquids (Level 0) (trials of minced-mech soft foods at BEDSIDE w/ Facility SLP monitoring toleration and making further recommendations.) Liquid Administration via: Straw (baseline) Medication Administration: Crushed with puree Supervision: Full assist for feeding; Full supervision/cueing for swallowing strategies Swallowing strategies  : Minimize environmental distractions; Slow rate; Small bites/sips; Multiple dry swallows after each bite/sip; Follow solids with liquids; Clear throat intermittently Postural changes: Position pt fully upright for meals; Stay upright 30-60 min after meals; Out of bed for meals (REFLUX precs.) Oral care recommendations: Oral care BID (2x/day); Oral care before PO; Staff/trained caregiver to provide oral care Recommended consults: Consider GI consultation; Consider  esophageal assessment; Consider dietitian consultation; Consider Palliative care (for further Education and Information) Treatment Plan Treatment Plan Treatment recommendations: Defer treatment plan to SLP at other venue (see follow-up recommendations) (continue f/u at SNF) Follow-up recommendations: Skilled nursing-short term rehab (<3 hours/day) Functional status assessment: Patient has had a recent decline in their functional status and/or demonstrates limited ability to make significant improvements in function in a reasonable and predictable amount of time. Treatment frequency: -- (TBD) Treatment duration: -- (TBD) Interventions: Patient/family education; Compensatory techniques; Diet toleration management by SLP; Trials of upgraded texture/liquids (under Supervision of Rehab SLP at SNF) Recommendations Recommendations for follow up therapy are one component of a multi-disciplinary discharge planning process, led by the attending physician.  Recommendations may be updated based on patient status, additional functional criteria and insurance authorization. Assessment: Orofacial Exam: Orofacial Exam Oral Cavity: Oral Hygiene: WFL Oral Cavity - Dentition: Adequate natural dentition Orofacial Anatomy: WFL Oral Motor/Sensory Function: WFL Anatomy: Anatomy: Prominent cricopharyngeus (strongly suspected.  Posture: severe Kyphotic appearance) Boluses Administered: Boluses Administered Boluses Administered: Thin liquids (Level 0); Moderately thick liquids (Level 3, honey thick); Puree; Solid  Oral Impairment Domain: Oral Impairment Domain Lip Closure: No labial escape Tongue control during bolus hold: Cohesive bolus between tongue to palatal seal Bolus preparation/mastication: Slow prolonged chewing/mashing with complete recollection (more anterior chewing d/t Kyphotic positioning and head down/forward(gravity)) Bolus transport/lingual motion: -- (Functional) Oral residue: Complete oral clearance (functional in setting  of head down/forward) Location of oral residue : N/A Initiation of pharyngeal swallow : Valleculae (appeared)  Pharyngeal Impairment Domain: Pharyngeal Impairment Domain Soft palate elevation: No bolus between soft palate (SP)/pharyngeal wall (PW) Laryngeal elevation: -- (could not assess fully) Anterior hyoid excursion: -- (could not assess fully) Epiglottic movement: Partial inversion (-Complete inversion) Pharyngeal contraction (A/P view only): N/A Pharyngoesophageal segment opening: Partial distention/partial duration, partial obstruction of flow Tongue base retraction: -- (could not assess fully) Pharyngeal residue: Collection of residue within or on pharyngeal structures Location of pharyngeal residue: Pyriform sinuses  Esophageal Impairment Domain: Esophageal Impairment Domain Esophageal clearance upright position: Esophageal retention with retrograde flow through the PES Pill: Pill Consistency administered: -- (n/a) Penetration/Aspiration Scale Score: Penetration/Aspiration Scale Score 1.  Material does not enter airway: Moderately thick liquids (Level 3, honey thick); Puree; Solid 2.  Material enters airway, remains ABOVE vocal cords then ejected out: Thin liquids (Level 0) (No buildup observed -- not fully certain there was laryngeal penetration?) Compensatory Strategies: Compensatory Strategies Compensatory strategies: Yes Multiple swallows: Effective   General Information: Caregiver present: Yes  Diet Prior to this Study: Dysphagia 1 (pureed); Thin liquids (Level 0) (when admitted to the hospital on 07/11/23 w/ trials to upgrade when she returned back to her Faciility and felt medically improved, stronger.)   No data recorded  Respiratory Status: WFL   Supplemental O2: Nasal cannula (2L)   History of Recent Intubation: No  Behavior/Cognition: Alert; Cooperative; Pleasant mood; Distractible; Requires cueing Self-Feeding Abilities: Dependent for feeding Baseline vocal quality/speech: Normal (Gravely)  Volitional Cough: Able to elicit No data recorded Exam Limitations: Poor participation; Poor positioning; Fatigue Goal Planning: Prognosis for improved oropharyngeal function: Guarded (-Fair) Barriers to Reach Goals: Time post onset; Severity of deficits (Kyphotic positioning) Barriers/Prognosis Comment: Kyphotic positioning; advanced age Patient/Family Stated Goal: gave understanding of information provided Consulted and agree with results and recommendations: Chief of Staff; Patient Pain: Pain Assessment Pain Assessment: 0-10 Pain Score: 4 Pain Location: "all over" moving to transfer to swallow chair Pain Descriptors / Indicators: Discomfort; Grimacing; Guarding; Moaning Pain Intervention(s): Monitored during session; Repositioned; Relaxation End of Session: Start Time:SLP Start Time (ACUTE ONLY): 1245 Stop Time: SLP Stop Time (ACUTE ONLY): 1415 Time Calculation:SLP Time Calculation (min) (ACUTE ONLY): 90 min Charges: SLP Evaluations $ SLP Speech Visit: 1 Visit SLP Evaluations $MBS Swallow: 1 Procedure SLP visit diagnosis: SLP Visit Diagnosis: Dysphagia, pharyngoesophageal phase (R13.14) (Kyphotic positioning) Past Medical History: Past Medical History: Diagnosis Date  Allergy   Anemia   Arthritis   B12 deficiency   Basal cell carcinoma   face, R arm, txted by Dr. Orson Aloe and [facility]  Cancer [facility])   skin--face and arms  Cataracts, bilateral   CHF (congestive heart failure) (HCC)   Chickenpox   CKD (chronic kidney disease), stage III (HCC)   Diverticulosis   GERD (gastroesophageal reflux disease)   Hyperlipidemia   Hypertension   Hypothyroidism   Osteoporosis   Squamous cell carcinoma of skin 05/01/2021  right distal tricep at elbow, left forearm near elbow, right dorsum hand, left mid lat forearm - all treated with EDC  Squamous cell carcinoma of skin 05/01/2021  right calf - EDC 07/14/21  Squamous cell carcinoma of skin 08/18/2021  R sup calf - ED&C  Squamous cell carcinoma of skin 12/29/2021  L mandible -  ED&C  Squamous cell carcinoma of skin 12/29/2021  L lat elbow - ED&C  Squamous cell carcinoma of skin 12/29/2021  L lat tricep - ED&C  Squamous cell carcinoma of skin 05/11/2022  Right deltoid - EDC  Squamous cell carcinoma of skin 05/11/2022  Left forearm - in situ - EDC Past Surgical History: Past Surgical History: Procedure Laterality Date  BUNIONECTOMY Bilateral   CARDIAC CATHETERIZATION    CATARACT EXTRACTION Bilateral   DILATION AND CURETTAGE OF UTERUS    EYE SURGERY Bilateral   cataract  surgery  JOINT REPLACEMENT Right   total knee replacement  meniscus tear Right   multiple skin cancer removal    NEUROMA SURGERY Bilateral   bilateral feet  rotator cuff surgery Right   TOTAL KNEE ARTHROPLASTY Left 05/06/2015  Procedure: TOTAL KNEE ARTHROPLASTY;  Surgeon: Kennedy Bucker, MD;  Location: [facility] ORS;  Service: Orthopedics;  Laterality: Left; Jerilynn Som, MS, CCC-SLP Speech Language Pathologist Rehab Services; [facility] [phone] (ascom) Watson,Katherine 08/03/2023, 6:48 PM CLINICAL DATA:  Oropharyngeal dysphagia EXAM: MODIFIED BARIUM SWALLOW TECHNIQUE: Different consistencies of barium were administered orally to the patient by the Speech Pathologist. Imaging of the pharynx was performed in the lateral projection. Mina Marble, PA-C was present in the fluoroscopy room during this study, which was supervised and interpreted by Dr. Elige Ko. FLUOROSCOPY: Radiation Exposure Index (as provided by the fluoroscopic device): 62.80 mGy Kerma COMPARISON:  None Available. FINDINGS: Vestibular  Penetration:  None visualized. Aspiration:  None visualized. Other:  Severe kyphosis of cervical spine obstructing visualization IMPRESSION: Severe kyphosis cervical spine limited evaluation today. No vestibular penetration or aspiration visualized on today's exam. Please refer to the Speech Pathologists report for complete details and recommendations. Electronically Signed   By: Elige Ko M.D.   On: 08/03/2023  15:12   EKG: Atrial fibrillation at 133 bpm  Telemetry reviewed (08/11/23): atrial fibrillation rate 50-60s  Data reviewed (08/11/23): last 24h vitals tele labs imaging I/O hospitalist progress notes  ASSESSMENT AND PLAN:   1.  Atrial fibrillation with rapid ventricular rate, converted to sinus rhythm on amiodarone infusion, with history of paroxysmal atrial fibrillation, on metoprolol tartrate for rate and rhythm control at home, not anticoagulated due to history of frequent falls. 2.  Bilateral pneumonia, likely  aspiration in nature, on ceftriaxone and doxycycline 3.  Frequent falls, multiple rib fractures 4.  CKD stage IIIa, renal function stable  Recommendations  1.  Agree with overall current therapy 2.  Defer chronic anticoagulation with history of multiple falls, recent rib fractures 3.  Amiodarone infusion running. IV metoprolol prn for elevated rates 4. Palliative care following. Patient interested in transitioning to hospice vs comfort care.  Maintain on amiodarone gtt until officially hospice/palliative care. Likely will happen tomorrow after family meeting. Cardiology will sign off at this time. Please call with any questions or concerns.  Signed: Clotilde Dieter, DO  08/11/2023, 1:20 PM

## 2023-08-11 NOTE — Progress Notes (Signed)
Mobility Specialist - Progress Note     08/11/23 1459  Mobility  Activity Dangled on edge of bed  Level of Assistance +2 (takes two people)  Activity Response Tolerated well  Mobility Referral Yes  $Mobility charge 1 Mobility  Mobility Specialist Start Time (ACUTE ONLY) 1440  Mobility Specialist Stop Time (ACUTE ONLY) 1456  Mobility Specialist Time Calculation (min) (ACUTE ONLY) 16 min   Pt resting in bed on 2L upon entry. Pt dangled on EOB with +2 assistance. Pt unable to fully short sit at EOB without assistance but agreeable to participate. Pt returned to bed to complete hygiene clean. Pt left in bed with needs in reach.   Johnathan Hausen Mobility Specialist 08/11/23, 3:04 PM

## 2023-08-11 NOTE — Progress Notes (Signed)
Pt is on continuous amiodarone drip but cardiac monitoring was discontinue. NP Jon Billings made aware. Will continue to monitor.

## 2023-08-12 DIAGNOSIS — J189 Pneumonia, unspecified organism: Secondary | ICD-10-CM | POA: Diagnosis not present

## 2023-08-12 DIAGNOSIS — N1831 Chronic kidney disease, stage 3a: Secondary | ICD-10-CM

## 2023-08-12 DIAGNOSIS — J9601 Acute respiratory failure with hypoxia: Secondary | ICD-10-CM | POA: Diagnosis not present

## 2023-08-12 DIAGNOSIS — R296 Repeated falls: Secondary | ICD-10-CM | POA: Diagnosis not present

## 2023-08-12 DIAGNOSIS — R1310 Dysphagia, unspecified: Secondary | ICD-10-CM | POA: Diagnosis not present

## 2023-08-12 DIAGNOSIS — G9341 Metabolic encephalopathy: Secondary | ICD-10-CM

## 2023-08-12 DIAGNOSIS — I480 Paroxysmal atrial fibrillation: Secondary | ICD-10-CM

## 2023-08-12 DIAGNOSIS — I10 Essential (primary) hypertension: Secondary | ICD-10-CM

## 2023-08-12 DIAGNOSIS — Z8781 Personal history of (healed) traumatic fracture: Secondary | ICD-10-CM

## 2023-08-12 DIAGNOSIS — E039 Hypothyroidism, unspecified: Secondary | ICD-10-CM

## 2023-08-12 DIAGNOSIS — R0902 Hypoxemia: Secondary | ICD-10-CM

## 2023-08-12 DIAGNOSIS — D72820 Lymphocytosis (symptomatic): Secondary | ICD-10-CM

## 2023-08-12 LAB — GLUCOSE, CAPILLARY: Glucose-Capillary: 153 mg/dL — ABNORMAL HIGH (ref 70–99)

## 2023-08-12 NOTE — Discharge Summary (Signed)
Physician Discharge Summary   Patient: Olivia Lewis MRN: [id number] DOB: 02-19-1929  Admit date:     08/06/2023  Discharge date: 08/12/23  Discharge Physician: Arnetha Courser   PCP: Gracelyn Nurse, MD   Recommendations at discharge:  Patient is being discharged to hospice facility  Discharge Diagnoses: Principal Problem:   Acute respiratory failure with hypoxia [facility]) Active Problems:   Pneumonia due to infectious organism   Dysphagia   History of multiple rib fractures 07/04/23   Frequent falls   Acute metabolic encephalopathy   Monoclonal B-cell lymphocytosis of undetermined significance   Chronic kidney disease, stage 3a (HCC)   Paroxysmal atrial fibrillation (HCC)   Hypothyroidism   Essential hypertension   Hypoxia   Hospital Course: Olivia Lewis is a 89 y.o. female with medical history significant for HFpEF, CKD stage IIIa, hypertension, osteoporosis, hypothyroidism, and frequent falls, recently hospitalized from 8/15 to 8/19 with respiratory failure secondary to multiple rib fractures from a fall, as well as chronic dysphagia s/p abnormal barium swallow on 9/11 who was sent from [facility] for evaluation of shortness of breath, productive cough and confusion that was noticed when her church friends visited her.     History is given by friends at bedside, [name] and [name] who stated that they saw the patient on the day prior and she had a cough and was mildly confused but on the day of arrival she was much more confused.  They stated that at baseline patient is very sharp and with it and manages her own affairs and is the power of attorney of her sister who is on the memory care unit.  On arrival of EMS she was reportedly saturating in the 60s on room air and was placed on oxygen, requiring up to 6 L to maintain sats in the 90s.   Patient was found to have bilateral pneumonia, likely ongoing aspiration with abnormal barium swallow on 08/03/2023, complicated by recent rib  fractures.  She was treated with ceftriaxone and completed the course of antibiotic.  Patient with difficulty swallowing, high risk for aspiration.  Being evaluated by swallow team and palliative care was consulted.  They recommended dysphagia 1 with nectar thick as patient does not want any feeding tube.  Patient still unable to tolerate that dietary recommendations. Patient was switched to full comfort care according to her wishes and is being discharged to hospice facility for end-of-life care.  Patient did had acute metabolic encephalopathy on presentation which has been improved.  Patient is currently saturating well on 2 L of oxygen.  Renal functions at baseline.  Patient also has an history of monoclonal B-cell lymphocytosis of undetermined significance and was following up with hematology as outpatient.  Based on advanced age and underlying comorbidities patient is approaching end-of-life, she is being discharged to hospice facility for further management.   Consultants: Cardiology.  Palliative care Procedures performed: None Disposition: Hospice care Diet recommendation:  Discharge Diet Orders (From admission, onward)     Start     Ordered   08/12/23 0000  Diet - low sodium heart healthy        08/12/23 1156           Dysphagia type 1 thick Liquid DISCHARGE MEDICATION: Allergies as of 08/12/2023       Reactions   Penicillin V Potassium Other (See Comments)   Has patient had a PCN reaction causing immediate rash, facial/tongue/throat swelling, SOB or lightheadedness with hypotension: Unknown Has patient had a PCN  reaction causing severe rash involving mucus membranes or skin necrosis: Unknown Has patient had a PCN reaction that required hospitalization: Unknown Has patient had a PCN reaction occurring within the last 10 years: No If all of the above answers are "NO", then may proceed with Cephalosporin use.   Sulfa Antibiotics Other (See Comments)   headache         Medication List     STOP taking these medications    B-12 SL   Calcium Carb-Cholecalciferol 500-200 MG-UNIT Tabs   docusate sodium 100 MG capsule Commonly known as: COLACE   fluconazole 100 MG tablet Commonly known as: Diflucan   levothyroxine 50 MCG tablet Commonly known as: SYNTHROID   lisinopril 10 MG tablet Commonly known as: ZESTRIL   magic mouthwash (nystatin, lidocaine, diphenhydrAMINE, alum & mag hydroxide) suspension   Metoprolol Tartrate 37.5 MG Tabs   oxyCODONE 5 MG immediate release tablet Commonly known as: Oxy IR/ROXICODONE   senna 8.6 MG Tabs tablet Commonly known as: SENOKOT       TAKE these medications    acetaminophen 500 MG tablet Commonly known as: TYLENOL Take 1,000 mg by mouth daily as needed.   Lidocaine 4 % Gel Apply to bilateral shoulders topically every 24 hours as needed. On 12 hours and off 12 hours.   nystatin cream Commonly known as: MYCOSTATIN Apply 1 Application topically 2 (two) times daily.   OXYGEN 2lpm every shift continuous for hypoxia   pantoprazole 40 MG tablet Commonly known as: PROTONIX Take 1 tablet (40 mg total) by mouth daily.   polyethylene glycol 17 g packet Commonly known as: MIRALAX / GLYCOLAX Take 34 g by mouth daily.   Zinc Oxide 12.8 % ointment Commonly known as: TRIPLE PASTE Apply 1 Application topically. Every shift.        Contact information for follow-up providers     Callwood, Gerda Diss D, MD. Go in 1 week(s).   Specialties: Cardiology, Internal Medicine Contact information: [address] [place] [address] [phone]              Contact information for after-discharge care     Destination     [facility] .   Service: Skilled Nursing Contact information: [address] [place] [address] [phone]                    Discharge Exam: There were no vitals filed for this visit. General.  Frail and pretty much  unresponsive lady, responding only to painful stimuli, appears comfortable Pulmonary.  Lungs clear bilaterally, normal respiratory effort. CV.  Regular rate and rhythm, no JVD, rub or murmur. Abdomen.  Soft, nontender, nondistended, BS positive. Extremities.  No edema, , pulses intact and symmetrical.  Condition at discharge: stable  The results of significant diagnostics from this hospitalization (including imaging, microbiology, ancillary and laboratory) are listed below for reference.   Imaging Studies: CT Angio Chest PE W and/or Wo Contrast  Result Date: 08/06/2023 CLINICAL DATA:  Hypoxia and shortness of breath EXAM: CT ANGIOGRAPHY CHEST WITH CONTRAST TECHNIQUE: Multidetector CT imaging of the chest was performed using the standard protocol during bolus administration of intravenous contrast. Multiplanar CT image reconstructions and MIPs were obtained to evaluate the vascular anatomy. RADIATION DOSE REDUCTION: This exam was performed according to the departmental dose-optimization program which includes automated exposure control, adjustment of the mA and/or kV according to patient size and/or use of iterative reconstruction technique. CONTRAST:  75mL OMNIPAQUE IOHEXOL 350 MG/ML  SOLN COMPARISON:  Chest x-ray from earlier in the same day. CT from 07/09/2023 FINDINGS: Cardiovascular: Atherosclerotic calcifications of the thoracic aorta are noted. The degree of opacification is limited precluding evaluation for dissection. No aneurysmal dilatation is seen. Coronary calcifications are noted. The pulmonary artery shows a normal branching pattern bilaterally. No filling defect to suggest pulmonary embolism is identified. Mediastinum/Nodes: Thoracic inlet is within normal limits. No hilar or mediastinal adenopathy is noted. The esophagus as visualized is within normal limits. Lungs/Pleura: Mild lower lobe consolidation is noted bilaterally. No bronchial obstructive changes are seen. No focal nodule or  sizable pleural effusion is seen. Upper Abdomen: Visualized upper abdomen is within normal limits. Musculoskeletal: Degenerative changes of the thoracic spine are noted. Multiple left-sided rib fractures are again seen stable from the prior study. No underlying pneumothorax is seen. Right-sided rib fractures are noted as well. Increased kyphosis is noted with upper thoracic compression deformity stable from the prior exam. Chronic manubrial and sternal fractures are seen. Review of the MIP images confirms the above findings. IMPRESSION: No evidence of pulmonary emboli. Bilateral lower lobe consolidation. Multiple rib fractures are noted bilaterally stable from the prior exam. Chronic thoracic compression deformities and sternal fractures are again noted. Aortic Atherosclerosis (ICD10-I70.0). Electronically Signed   By: Alcide Clever M.D.   On: 08/06/2023 23:00   DG Chest 1 View  Result Date: 08/06/2023 CLINICAL DATA:  Short of breath, possible aspiration EXAM: CHEST  1 VIEW COMPARISON:  05/18/2023, 07/09/2023 FINDINGS: 2 frontal views of the chest demonstrates stable enlargement of the cardiac silhouette. No acute airspace disease, effusion, or pneumothorax. Displaced left lateral fourth through seventh rib fractures are noted. Multiple prior healed bilateral rib fractures are again noted. Severe degenerative changes of the right shoulder. IMPRESSION: 1. No acute airspace disease. 2. Acute to subacute displaced left lateral fourth through seventh rib fractures. Electronically Signed   By: Sharlet Salina M.D.   On: 08/06/2023 18:37   DG SWALLOW FUNC OP MEDICARE SPEECH PATH  Result Date: 08/03/2023 Table formatting from the original result was not included. Modified Barium Swallow Study Patient Details Name: CHERYLENE GRIFKA MRN: [id number] Date of Birth: Nov 16, 1929 Today's Date: 08/03/2023 HPI/PMH: HPI: Pt is a 89 y.o. female with medical history significant of Kyphosis ("sometimes wear a neck brace at home"),  Gastric REFLUX, HFpEF, CKD stage III, hypertension, hyperlipidemia, osteoporosis, hypothyroidism, and frequent Falls who presents to the ED on 07/11/2023 s/p Fall, rib fxs. Pt requires support w/ all ADLs at [facility]. Pt has Severe Kyphotic positioning impacting head up position for oral intake, sitting in chair. Clinical Impression Patient appears to present w/ mild-moderate chronic oropharyngeal phase dysphagia and Esophageal phase dysphagia. Contributing factors impacting swallowing include severe Kyphotic posture and structural age-related changes along w/ advance age(89yo). Oral phase is characterized by slow mastication and A-P transfer -- suspect impact of Gravity from head down/forwad position. Slight-min residue appeared to clear w/ f/u, Dry swallow. Swallow initiation appeared consistent at the level of the Valleculae. Pharyngeal phase is noted for functional base of tongue retraction and peristalsis w/ no pharyngeal residue collection post swallow. OF NOTE, suspected partial PE Segment opening/obstruction(?) appeared to contribute to decreased pharyngeal and PE Segment clearance of bolus material w/ all trials resulting in closure preventing full bolus clearance of the area -- a min-mod amount of the bolus remained and/or returned in the pharynx/Pyriform Sinuses post swallow. No obvious laryngeal penetration nore aspiration occurred d/t the pharyngeal residue, although, pt is at increased risk of  such to occur.  Subsequent Dry swallow appeared somewhat effective in reducing/clearing the Pyriform Sinus residue. Many of the other pharyngeal components of swallowing(epiglottic inversion, exursion, BOT retraction) were difficult to assess d/t significantly obscured view d/t the Kyphotic positioning. No obvious aspiration occurred during trials given; possible slight/brief laryngeal penetration during larger sips of thin liquids (via straw) noted but no build-up w/ f/u trial. OF NOTE: pt exhibited Esophageal  phase Dysmotility w/ retrograde flow to/through the pyriform sinuses; bolus stasis in mid-Esophagus intermittently.  Due to pt's positioning and discomfort during the exam, further trials were not given and exam ended.  Pt and Family member were given Education post MBSS. Recommended strategies to include: small, single bites/sips, clear oropharyngnx w/ Dry swallow prior to taking another bite/sip, alternate food/liquid. Head forward posture w/ oral intake. Oral care prior to meals to reduce oral bacterial load. STRICT REFLUX PRECAUTIONS including remaining upright for ~45-60 mins post meals. Recommend to continue to monitor for any negative sequelae from potential aspiration/REFLUX aspiration and consult MD, Palliative Care. Factors that may increase risk of adverse event in presence of aspiration [employer] 2021): Poor general health and/or compromised immunity;Limited mobility;Frail or deconditioned;Dependence for feeding and/or oral hygiene (Kyphotic positioning) Recommendations/Plan: Swallowing Evaluation Recommendations Swallowing Evaluation Recommendations Recommendations: PO diet PO Diet Recommendation: Dysphagia 2 (Finely chopped); Dysphagia 3 (Mechanical soft); Thin liquids (Level 0) (trials of minced-mech soft foods at BEDSIDE w/ Facility SLP monitoring toleration and making further recommendations.) Liquid Administration via: Straw (baseline) Medication Administration: Crushed with puree Supervision: Full assist for feeding; Full supervision/cueing for swallowing strategies Swallowing strategies  : Minimize environmental distractions; Slow rate; Small bites/sips; Multiple dry swallows after each bite/sip; Follow solids with liquids; Clear throat intermittently Postural changes: Position pt fully upright for meals; Stay upright 30-60 min after meals; Out of bed for meals (REFLUX precs.) Oral care recommendations: Oral care BID (2x/day); Oral care before PO; Staff/trained caregiver to provide oral  care Recommended consults: Consider GI consultation; Consider esophageal assessment; Consider dietitian consultation; Consider Palliative care (for further Education and Information) Treatment Plan Treatment Plan Treatment recommendations: Defer treatment plan to SLP at other venue (see follow-up recommendations) (continue f/u at SNF) Follow-up recommendations: Skilled nursing-short term rehab (<3 hours/day) Functional status assessment: Patient has had a recent decline in their functional status and/or demonstrates limited ability to make significant improvements in function in a reasonable and predictable amount of time. Treatment frequency: -- (TBD) Treatment duration: -- (TBD) Interventions: Patient/family education; Compensatory techniques; Diet toleration management by SLP; Trials of upgraded texture/liquids (under Supervision of Rehab SLP at SNF) Recommendations Recommendations for follow up therapy are one component of a multi-disciplinary discharge planning process, led by the attending physician.  Recommendations may be updated based on patient status, additional functional criteria and insurance authorization. Assessment: Orofacial Exam: Orofacial Exam Oral Cavity: Oral Hygiene: WFL Oral Cavity - Dentition: Adequate natural dentition Orofacial Anatomy: WFL Oral Motor/Sensory Function: WFL Anatomy: Anatomy: Prominent cricopharyngeus (strongly suspected.  Posture: severe Kyphotic appearance) Boluses Administered: Boluses Administered Boluses Administered: Thin liquids (Level 0); Moderately thick liquids (Level 3, honey thick); Puree; Solid  Oral Impairment Domain: Oral Impairment Domain Lip Closure: No labial escape Tongue control during bolus hold: Cohesive bolus between tongue to palatal seal Bolus preparation/mastication: Slow prolonged chewing/mashing with complete recollection (more anterior chewing d/t Kyphotic positioning and head down/forward(gravity)) Bolus transport/lingual motion: -- (Functional)  Oral residue: Complete oral clearance (functional in setting of head down/forward) Location of oral residue : N/A Initiation of pharyngeal swallow :  Valleculae (appeared)  Pharyngeal Impairment Domain: Pharyngeal Impairment Domain Soft palate elevation: No bolus between soft palate (SP)/pharyngeal wall (PW) Laryngeal elevation: -- (could not assess fully) Anterior hyoid excursion: -- (could not assess fully) Epiglottic movement: Partial inversion (-Complete inversion) Pharyngeal contraction (A/P view only): N/A Pharyngoesophageal segment opening: Partial distention/partial duration, partial obstruction of flow Tongue base retraction: -- (could not assess fully) Pharyngeal residue: Collection of residue within or on pharyngeal structures Location of pharyngeal residue: Pyriform sinuses  Esophageal Impairment Domain: Esophageal Impairment Domain Esophageal clearance upright position: Esophageal retention with retrograde flow through the PES Pill: Pill Consistency administered: -- (n/a) Penetration/Aspiration Scale Score: Penetration/Aspiration Scale Score 1.  Material does not enter airway: Moderately thick liquids (Level 3, honey thick); Puree; Solid 2.  Material enters airway, remains ABOVE vocal cords then ejected out: Thin liquids (Level 0) (No buildup observed -- not fully certain there was laryngeal penetration?) Compensatory Strategies: Compensatory Strategies Compensatory strategies: Yes Multiple swallows: Effective   General Information: Caregiver present: Yes  Diet Prior to this Study: Dysphagia 1 (pureed); Thin liquids (Level 0) (when admitted to the hospital on 07/11/23 w/ trials to upgrade when she returned back to her Faciility and felt medically improved, stronger.)   No data recorded  Respiratory Status: WFL   Supplemental O2: Nasal cannula (2L)   History of Recent Intubation: No  Behavior/Cognition: Alert; Cooperative; Pleasant mood; Distractible; Requires cueing Self-Feeding Abilities: Dependent for  feeding Baseline vocal quality/speech: Normal (Gravely) Volitional Cough: Able to elicit No data recorded Exam Limitations: Poor participation; Poor positioning; Fatigue Goal Planning: Prognosis for improved oropharyngeal function: Guarded (-Fair) Barriers to Reach Goals: Time post onset; Severity of deficits (Kyphotic positioning) Barriers/Prognosis Comment: Kyphotic positioning; advanced age Patient/Family Stated Goal: gave understanding of information provided Consulted and agree with results and recommendations: Chief of Staff; Patient Pain: Pain Assessment Pain Assessment: 0-10 Pain Score: 4 Pain Location: "all over" moving to transfer to swallow chair Pain Descriptors / Indicators: Discomfort; Grimacing; Guarding; Moaning Pain Intervention(s): Monitored during session; Repositioned; Relaxation End of Session: Start Time:SLP Start Time (ACUTE ONLY): 1245 Stop Time: SLP Stop Time (ACUTE ONLY): 1415 Time Calculation:SLP Time Calculation (min) (ACUTE ONLY): 90 min Charges: SLP Evaluations $ SLP Speech Visit: 1 Visit SLP Evaluations $MBS Swallow: 1 Procedure SLP visit diagnosis: SLP Visit Diagnosis: Dysphagia, pharyngoesophageal phase (R13.14) (Kyphotic positioning) Past Medical History: Past Medical History: Diagnosis Date  Allergy   Anemia   Arthritis   B12 deficiency   Basal cell carcinoma   face, R arm, txted by Dr. Orson Aloe and [facility]  Cancer [facility])   skin--face and arms  Cataracts, bilateral   CHF (congestive heart failure) (HCC)   Chickenpox   CKD (chronic kidney disease), stage III (HCC)   Diverticulosis   GERD (gastroesophageal reflux disease)   Hyperlipidemia   Hypertension   Hypothyroidism   Osteoporosis   Squamous cell carcinoma of skin 05/01/2021  right distal tricep at elbow, left forearm near elbow, right dorsum hand, left mid lat forearm - all treated with EDC  Squamous cell carcinoma of skin 05/01/2021  right calf - EDC 07/14/21  Squamous cell carcinoma of skin 08/18/2021  R sup calf - ED&C   Squamous cell carcinoma of skin 12/29/2021  L mandible - ED&C  Squamous cell carcinoma of skin 12/29/2021  L lat elbow - ED&C  Squamous cell carcinoma of skin 12/29/2021  L lat tricep - ED&C  Squamous cell carcinoma of skin 05/11/2022  Right deltoid - EDC  Squamous cell carcinoma of skin 05/11/2022  Left forearm - in situ - EDC Past Surgical History: Past Surgical History: Procedure Laterality Date  BUNIONECTOMY Bilateral   CARDIAC CATHETERIZATION    CATARACT EXTRACTION Bilateral   DILATION AND CURETTAGE OF UTERUS    EYE SURGERY Bilateral   cataract  surgery  JOINT REPLACEMENT Right   total knee replacement  meniscus tear Right   multiple skin cancer removal    NEUROMA SURGERY Bilateral   bilateral feet  rotator cuff surgery Right   TOTAL KNEE ARTHROPLASTY Left 05/06/2015  Procedure: TOTAL KNEE ARTHROPLASTY;  Surgeon: Kennedy Bucker, MD;  Location: [facility] ORS;  Service: Orthopedics;  Laterality: Left; Jerilynn Som, MS, CCC-SLP Speech Language Pathologist Rehab Services; [facility] [phone] (ascom) Watson,Katherine 08/03/2023, 6:48 PM CLINICAL DATA:  Oropharyngeal dysphagia EXAM: MODIFIED BARIUM SWALLOW TECHNIQUE: Different consistencies of barium were administered orally to the patient by the Speech Pathologist. Imaging of the pharynx was performed in the lateral projection. Mina Marble, PA-C was present in the fluoroscopy room during this study, which was supervised and interpreted by Dr. Elige Ko. FLUOROSCOPY: Radiation Exposure Index (as provided by the fluoroscopic device): 62.80 mGy Kerma COMPARISON:  None Available. FINDINGS: Vestibular  Penetration:  None visualized. Aspiration:  None visualized. Other:  Severe kyphosis of cervical spine obstructing visualization IMPRESSION: Severe kyphosis cervical spine limited evaluation today. No vestibular penetration or aspiration visualized on today's exam. Please refer to the Speech Pathologists report for complete details and recommendations.  Electronically Signed   By: Elige Ko M.D.   On: 08/03/2023 15:12   Microbiology: Results for orders placed or performed during the hospital encounter of 08/06/23  SARS Coronavirus 2 by RT PCR (hospital order, performed in [facility] hospital lab) *cepheid single result test* Anterior Nasal Swab     Status: None   Collection Time: 08/06/23  6:34 PM   Specimen: Anterior Nasal Swab  Result Value Ref Range Status   SARS Coronavirus 2 by RT PCR NEGATIVE NEGATIVE Final    Comment: (NOTE) SARS-CoV-2 target nucleic acids are NOT DETECTED.  The SARS-CoV-2 RNA is generally detectable in upper and lower respiratory specimens during the acute phase of infection. The lowest concentration of SARS-CoV-2 viral copies this assay can detect is 250 copies / mL. A negative result does not preclude SARS-CoV-2 infection and should not be used as the sole basis for treatment or other patient management decisions.  A negative result may occur with improper specimen collection / handling, submission of specimen other than nasopharyngeal swab, presence of viral mutation(s) within the areas targeted by this assay, and inadequate number of viral copies (<250 copies / mL). A negative result must be combined with clinical observations, patient history, and epidemiological information.  Fact Sheet for Patients:   RoadLapTop.co.za  Fact Sheet for Healthcare Providers: http://kim-miller.com/  This test is not yet approved or  cleared by the [place] FDA and has been authorized for detection and/or diagnosis of SARS-CoV-2 by FDA under an Emergency Use Authorization (EUA).  This EUA will remain in effect (meaning this test can be used) for the duration of the COVID-19 declaration under Section 564(b)(1) of the Act, 21 U.S.C. section 360bbb-3(b)(1), unless the authorization is terminated or revoked sooner.  Performed at [facility], [address] [place], [place] [address]   Blood culture (routine x 2)     Status: None   Collection Time: 08/06/23  7:50 PM   Specimen: BLOOD  Result Value Ref Range Status   Specimen Description BLOOD BLOOD LEFT  ARM  Final   Special Requests   Final    BOTTLES DRAWN AEROBIC ONLY Blood Culture results may not be optimal due to an inadequate volume of blood received in culture bottles   Culture   Final    NO GROWTH 5 DAYS Performed at [facility], [address] [place], [place] [address]    Report Status 08/11/2023 FINAL  Final  Blood culture (routine x 2)     Status: None   Collection Time: 08/06/23  7:53 PM   Specimen: Left Antecubital; Blood  Result Value Ref Range Status   Specimen Description LEFT ANTECUBITAL  Final   Special Requests   Final    BOTTLES DRAWN AEROBIC AND ANAEROBIC Blood Culture results may not be optimal due to an inadequate volume of blood received in culture bottles   Culture   Final    NO GROWTH 5 DAYS Performed at [facility], [address], [place], [place] [address]    Report Status 08/11/2023 FINAL  Final  Expectorated Sputum Assessment w Gram Stain, Rflx to Resp Cult     Status: None   Collection Time: 08/07/23 12:40 AM   Specimen: Expectorated Sputum  Result Value Ref Range Status   Specimen Description EXPECTORATED SPUTUM  Final   Special Requests NONE  Final   Sputum evaluation   Final    Sputum specimen not acceptable for testing.  Please recollect.   Performed at [facility], [address] [place], [place] [address]    Report Status 08/07/2023 FINAL  Final  MRSA Next Gen by PCR, Nasal     Status: None   Collection Time: 08/07/23 12:40 AM   Specimen: Nasal Mucosa; Nasal Swab  Result Value Ref Range Status   MRSA by PCR Next Gen NOT DETECTED NOT DETECTED Final    Comment: (NOTE) The GeneXpert MRSA Assay (FDA approved for NASAL specimens only), is one component of a comprehensive MRSA colonization  surveillance program. It is not intended to diagnose MRSA infection nor to guide or monitor treatment for MRSA infections. Test performance is not FDA approved in patients less than 44 years old. Performed at [facility], [address] [place], [place] [address]   Expectorated Sputum Assessment w Gram Stain, Rflx to Resp Cult     Status: None   Collection Time: 08/07/23  2:57 AM   Specimen: Sputum  Result Value Ref Range Status   Specimen Description SPU  Final   Special Requests SPU  Final   Sputum evaluation   Final    THIS SPECIMEN IS ACCEPTABLE FOR SPUTUM CULTURE Performed at [facility], [address], [place], [place] [address]    Report Status 08/07/2023 FINAL  Final  Culture, Respiratory w Gram Stain     Status: None   Collection Time: 08/07/23  2:57 AM   Specimen: Sputum  Result Value Ref Range Status   Specimen Description   Final    SPU Performed at [facility], [address], [place], [place] [address]    Special Requests   Final    SPU Reflexed from [id number] Performed at [facility], [address] [place], [place] [address]    Gram Stain   Final    MODERATE SQUAMOUS EPITHELIAL CELLS PRESENT WBC PRESENT, PREDOMINANTLY PMN ABUNDANT GRAM POSITIVE RODS ABUNDANT GRAM NEGATIVE RODS FEW GRAM POSITIVE COCCI    Culture   Final    Normal respiratory flora-no Staph aureus or Pseudomonas seen Performed  at [facility], [address] [place], [place] [address]    Report Status 08/09/2023 FINAL  Final    Labs: CBC: Recent Labs  Lab 08/07/23 0452 08/08/23 0516 08/09/23 0452 08/10/23 0340 08/11/23 0859  WBC 24.1* 12.4* 11.4* 10.0 9.2  NEUTROABS 20.1*  --   --   --   --   HGB 14.0 14.6 14.1 14.2 13.6  HCT 43.1 43.7 43.0 44.4 41.5  MCV 91.5 89.4 89.8 91.9 89.2  PLT 264 268 283 274 260   Basic Metabolic Panel: Recent Labs  Lab 08/07/23 0452 08/08/23 0516 08/09/23 0452 08/10/23 0340 08/11/23 0859  NA  139 137 139 143 140  K 3.8 3.7 3.3* 3.9 3.6  CL 103 97* 102 102 102  CO2 27 27 28 27 26   GLUCOSE 78 83 80 55* 68*  BUN 24* 23 20 20 18   CREATININE 0.74 0.81 0.79 0.74 0.80  CALCIUM 7.8* 9.0 9.1 9.3 8.7*  MG 1.7 2.2 2.2 2.0 1.8   Liver Function Tests: No results for input(s): "AST", "ALT", "ALKPHOS", "BILITOT", "PROT", "ALBUMIN" in the last 168 hours. CBG: Recent Labs  Lab 08/11/23 0401 08/11/23 0805 08/11/23 1301 08/11/23 1735 08/12/23 0619  GLUCAP 75 71 84 110* 153*    Discharge time spent: greater than 30 minutes.  This record has been created using Conservation officer, historic buildings. Errors have been sought and corrected,but may not always be located. Such creation errors do not reflect on the standard of care.   Signed: Arnetha Courser, MD [facility] 08/12/2023

## 2023-08-12 NOTE — Care Management Important Message (Signed)
Important Message  Patient Details  Name: Olivia Lewis MRN: [id number] Date of Birth: 12-08-1928   Medicare Important Message Given:  Other (see comment)  Disposition to discharge with hospice services.  Medicare IM withheld at this time out of respect for patient and family.   Johnell Comings 08/12/2023, 8:53 AM

## 2023-08-12 NOTE — Hospital Course (Signed)
Olivia Lewis is a 89 y.o. female with medical history significant for HFpEF, CKD stage IIIa, hypertension, osteoporosis, hypothyroidism, and frequent falls, recently hospitalized from 8/15 to 8/19 with respiratory failure secondary to multiple rib fractures from a fall, as well as chronic dysphagia s/p abnormal barium swallow on 9/11 who was sent from [facility] for evaluation of shortness of breath, productive cough and confusion that was noticed when her church friends visited her.     History is given by friends at bedside, [name] and [name] who stated that they saw the patient on the day prior and she had a cough and was mildly confused but on the day of arrival she was much more confused.  They stated that at baseline patient is very sharp and with it and manages her own affairs and is the power of attorney of her sister who is on the memory care unit.  On arrival of EMS she was reportedly saturating in the 60s on room air and was placed on oxygen, requiring up to 6 L to maintain sats in the 90s.   Patient was found to have bilateral pneumonia, likely ongoing aspiration with abnormal barium swallow on 08/03/2023, complicated by recent rib fractures.  She was treated with ceftriaxone and completed the course of antibiotic.  Patient with difficulty swallowing, high risk for aspiration.  Being evaluated by swallow team and palliative care was consulted.  They recommended dysphagia 1 with nectar thick as patient does not want any feeding tube.  Patient still unable to tolerate that dietary recommendations. Patient was switched to full comfort care according to her wishes and is being discharged to hospice facility for end-of-life care.  Patient did had acute metabolic encephalopathy on presentation which has been improved.  Patient is currently saturating well on 2 L of oxygen.  Renal functions at baseline.  Patient also has an history of monoclonal B-cell lymphocytosis of undetermined significance and  was following up with hematology as outpatient.  Based on advanced age and underlying comorbidities patient is approaching end-of-life, she is being discharged to hospice facility for further management.

## 2023-08-12 NOTE — Progress Notes (Signed)
[facility]- Civil engineer, contracting  Patient approved for admission to [facility] today.  POA will sign consents at [facility].  Once signed,  [facility] will call EMS to transport.  RN, please call report to [facility] at [phone].   Thank you for the opportunity to participate in this patient's care. [facility] Liaison [phone]

## 2023-08-12 NOTE — Progress Notes (Signed)
   08/12/23 1400  Spiritual Encounters  Type of Visit Initial  Care provided to: Pt and family  Referral source Chaplain assessment  Reason for visit Routine spiritual support  OnCall Visit No  Spiritual Framework  Presenting Themes Impactful experiences and emotions;Coping tools;Courage hope and growth  Community/Connection Family;Faith community;Friend(s)  Patient Stress Factors Health changes  Family Stress Factors None identified  Interventions  Spiritual Care Interventions Made Encouragement;Established relationship of care and support;Compassionate presence  Intervention Outcomes  Outcomes Connection to values and goals of care;Autonomy/agency;Awareness of health;Awareness of support  Spiritual Care Plan  Spiritual Care Issues Still Outstanding No further spiritual care needs at this time (see row info)   Chaplain was doing rounding and stopped in to check on the patient. Chaplain met the patient and two of her close friends that were visiting. The patient shared that she has been feeling really uncomfortable and it is hard to get comfortable in the bed. Patients friend shared with the chaplain that the patient will be leaving to go to hospice soon. Chaplain offered encouraging words to the patient and friends.

## 2023-08-12 NOTE — TOC Transition Note (Signed)
Transition of Care [facility]) - CM/SW Discharge Note   Patient Details  Name: Olivia Lewis MRN: [id number] Date of Birth: 10/01/1929  Transition of Care [facility]) CM/SW Contact:  Darolyn Rua, LCSW Phone Number: 08/12/2023, 12:01 PM   Clinical Narrative:     Patient to discharge to [facility] today, RN to call report to [facility] at [phone].   EMS forms on chart, hospice liaison to call ems  Final next level of care: Hospice Medical Facility Barriers to Discharge: No Barriers Identified   Patient Goals and CMS Choice CMS Medicare.gov Compare Post Acute Care list provided to:: Patient Choice offered to / list presented to : Patient  Discharge Placement                         Discharge Plan and Services Additional resources added to the After Visit Summary for       Post Acute Care Choice: Resumption of Svcs/PTA Provider                               Social Determinants of Health (SDOH) Interventions SDOH Screenings   Food Insecurity: Patient Unable To Answer (08/08/2023)  Housing: Patient Unable To Answer (08/08/2023)  Transportation Needs: Patient Unable To Answer (08/08/2023)  Utilities: Patient Unable To Answer (08/08/2023)  Tobacco Use: Low Risk  (08/08/2023)     Readmission Risk Interventions     No data to display

## 2023-08-15 ENCOUNTER — Telehealth: Payer: Self-pay | Admitting: Oncology

## 2023-08-15 NOTE — Telephone Encounter (Signed)
Pt friend called to cancel appts-appts have been canceled- pt is now at hospice

## 2023-08-23 DEATH — deceased

## 2023-09-06 ENCOUNTER — Ambulatory Visit: Payer: Medicare Other | Admitting: Oncology

## 2023-09-06 ENCOUNTER — Other Ambulatory Visit: Payer: Medicare Other
